# Patient Record
Sex: Male | Born: 1945 | Race: White | Hispanic: No | Marital: Married | Smoking: Former smoker
Health system: Southern US, Community
[De-identification: ages and names within clinical notes are randomized; demographics above are authoritative.]

## PROBLEM LIST (undated history)

## (undated) DIAGNOSIS — I219 Acute myocardial infarction, unspecified: Secondary | ICD-10-CM

## (undated) DIAGNOSIS — J189 Pneumonia, unspecified organism: Secondary | ICD-10-CM

## (undated) DIAGNOSIS — K219 Gastro-esophageal reflux disease without esophagitis: Secondary | ICD-10-CM

## (undated) DIAGNOSIS — Z96659 Presence of unspecified artificial knee joint: Secondary | ICD-10-CM

## (undated) DIAGNOSIS — I4510 Unspecified right bundle-branch block: Secondary | ICD-10-CM

## (undated) DIAGNOSIS — T84038A Mechanical loosening of other internal prosthetic joint, initial encounter: Secondary | ICD-10-CM

## (undated) DIAGNOSIS — K279 Peptic ulcer, site unspecified, unspecified as acute or chronic, without hemorrhage or perforation: Secondary | ICD-10-CM

## (undated) DIAGNOSIS — I2510 Atherosclerotic heart disease of native coronary artery without angina pectoris: Secondary | ICD-10-CM

## (undated) DIAGNOSIS — F32A Depression, unspecified: Secondary | ICD-10-CM

## (undated) DIAGNOSIS — Z789 Other specified health status: Secondary | ICD-10-CM

## (undated) DIAGNOSIS — N281 Cyst of kidney, acquired: Secondary | ICD-10-CM

## (undated) DIAGNOSIS — G629 Polyneuropathy, unspecified: Secondary | ICD-10-CM

## (undated) DIAGNOSIS — R519 Headache, unspecified: Secondary | ICD-10-CM

## (undated) DIAGNOSIS — R51 Headache: Secondary | ICD-10-CM

## (undated) DIAGNOSIS — D649 Anemia, unspecified: Secondary | ICD-10-CM

## (undated) DIAGNOSIS — I209 Angina pectoris, unspecified: Secondary | ICD-10-CM

## (undated) DIAGNOSIS — D696 Thrombocytopenia, unspecified: Secondary | ICD-10-CM

## (undated) DIAGNOSIS — E785 Hyperlipidemia, unspecified: Secondary | ICD-10-CM

## (undated) DIAGNOSIS — F419 Anxiety disorder, unspecified: Secondary | ICD-10-CM

## (undated) DIAGNOSIS — G43909 Migraine, unspecified, not intractable, without status migrainosus: Secondary | ICD-10-CM

## (undated) DIAGNOSIS — F0280 Dementia in other diseases classified elsewhere without behavioral disturbance: Secondary | ICD-10-CM

## (undated) DIAGNOSIS — R4189 Other symptoms and signs involving cognitive functions and awareness: Secondary | ICD-10-CM

## (undated) DIAGNOSIS — E119 Type 2 diabetes mellitus without complications: Secondary | ICD-10-CM

## (undated) DIAGNOSIS — R251 Tremor, unspecified: Secondary | ICD-10-CM

## (undated) DIAGNOSIS — G4730 Sleep apnea, unspecified: Secondary | ICD-10-CM

## (undated) DIAGNOSIS — N183 Chronic kidney disease, stage 3 unspecified: Secondary | ICD-10-CM

## (undated) DIAGNOSIS — Z8719 Personal history of other diseases of the digestive system: Secondary | ICD-10-CM

## (undated) DIAGNOSIS — M1990 Unspecified osteoarthritis, unspecified site: Secondary | ICD-10-CM

## (undated) DIAGNOSIS — H9110 Presbycusis, unspecified ear: Secondary | ICD-10-CM

## (undated) DIAGNOSIS — Z8669 Personal history of other diseases of the nervous system and sense organs: Secondary | ICD-10-CM

## (undated) DIAGNOSIS — G2581 Restless legs syndrome: Secondary | ICD-10-CM

## (undated) DIAGNOSIS — Z7982 Long term (current) use of aspirin: Secondary | ICD-10-CM

## (undated) DIAGNOSIS — G4700 Insomnia, unspecified: Secondary | ICD-10-CM

## (undated) DIAGNOSIS — I272 Pulmonary hypertension, unspecified: Secondary | ICD-10-CM

## (undated) DIAGNOSIS — J449 Chronic obstructive pulmonary disease, unspecified: Secondary | ICD-10-CM

## (undated) HISTORY — PX: TONSILLECTOMY: SUR1361

## (undated) HISTORY — PX: HERNIA REPAIR: SHX51

## (undated) HISTORY — PX: COLONOSCOPY: SHX174

## (undated) HISTORY — PX: CORONARY ANGIOPLASTY: SHX604

## (undated) HISTORY — PX: VASECTOMY: SHX75

## (undated) HISTORY — PX: INGUINAL HERNIA REPAIR: SUR1180

## (undated) HISTORY — PX: EYE SURGERY: SHX253

---

## 1994-06-09 DIAGNOSIS — I219 Acute myocardial infarction, unspecified: Secondary | ICD-10-CM

## 1994-06-09 HISTORY — DX: Acute myocardial infarction, unspecified: I21.9

## 1995-06-10 HISTORY — PX: CORONARY ANGIOPLASTY WITH STENT PLACEMENT: SHX49

## 1998-06-09 HISTORY — PX: CORONARY ANGIOPLASTY WITH STENT PLACEMENT: SHX49

## 2002-06-09 DIAGNOSIS — Z8719 Personal history of other diseases of the digestive system: Secondary | ICD-10-CM

## 2002-06-09 HISTORY — DX: Personal history of other diseases of the digestive system: Z87.19

## 2003-06-10 HISTORY — PX: KNEE ARTHROSCOPY: SUR90

## 2004-06-09 HISTORY — PX: KNEE ARTHROSCOPY: SUR90

## 2005-06-09 HISTORY — PX: JOINT REPLACEMENT: SHX530

## 2005-06-09 HISTORY — PX: TOTAL KNEE ARTHROPLASTY: SHX125

## 2010-04-02 HISTORY — PX: LEFT HEART CATH AND CORONARY ANGIOGRAPHY: CATH118249

## 2010-06-09 HISTORY — PX: HEEL SPUR RESECTION: SHX6410

## 2013-06-09 HISTORY — PX: EYE SURGERY: SHX253

## 2013-06-09 HISTORY — PX: TOTAL KNEE ARTHROPLASTY: SHX125

## 2013-06-09 HISTORY — PX: CATARACT EXTRACTION: SUR2

## 2013-06-09 HISTORY — PX: JOINT REPLACEMENT: SHX530

## 2015-04-30 HISTORY — PX: LEFT HEART CATH AND CORONARY ANGIOGRAPHY: CATH118249

## 2015-06-10 HISTORY — PX: UPPER GI ENDOSCOPY: SHX6162

## 2015-06-10 HISTORY — PX: OTHER SURGICAL HISTORY: SHX169

## 2016-03-17 HISTORY — PX: LAPAROSCOPIC GASTRIC SLEEVE RESECTION: SHX5895

## 2017-11-21 ENCOUNTER — Ambulatory Visit
Admission: EM | Admit: 2017-11-21 | Discharge: 2017-11-21 | Disposition: A | Discharge status: Home / Self Care | Payer: TRICARE For Life (TFL) | Attending: Family Medicine | Admitting: Family Medicine

## 2017-11-21 DIAGNOSIS — S61012A Laceration without foreign body of left thumb without damage to nail, initial encounter: Secondary | ICD-10-CM

## 2017-11-21 DIAGNOSIS — W260XXA Contact with knife, initial encounter: Secondary | ICD-10-CM

## 2017-11-21 NOTE — ED Provider Notes (Signed)
[facility]  CSN: [id number] Arrival date & time: 11/21/17  1449  History   Chief Complaint Chief Complaint  Patient presents with  . Finger Injury   HPI   72 year old male presents with a thumb laceration.  Patient was using a knife to try and remove something from a camera.  He inadvertently cut his left thumb.  Patient reports linear laceration to his left thumb on the palmar side.  Patient reports that he had difficulty controlling the bleeding.  He states that it was a clean blade.  Last tetanus was in 2017.  Mild pain currently.  No nailbed injury.  Wound is not contaminated.  He did not clean the wound prior to arrival.  No other associated symptoms.  No other complaints at this time.  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   Surgical Hx: SHOULDER SURGERY 06/09/1993 - 06/08/1994 Right    REPLACEMENT TOTAL KNEE 06/09/2005 - 06/08/2006 Left    HEEL SPUR EXCISION 06/09/2010 - 06/09/2011 Right    CORONARY ANGIOPLASTY WITH STENT PLACEMENT   x2 one RCA 1997/ LCX 2000   PR REMV  CATARACT EXTRACAP,INSERT LENS 11/23/2013 Eye/Right Procedure: CATARACT EXTRACTION AND INTRAOCULAR LENS IMPLANTATION USING FEMTOSECOND LASER RIGHT EYE; Surgeon: Marylene Land, MD; Location: [facility]; Service: Ophthalmology  Medical devices from this surgery are in the Implants section.   PR CORNEA INCISNS RECIPIENT CORNEA W/LASR Deidre Ala 11/23/2013 Eye/Right Procedure: FEMTO SECOND PROCEDURE RIGHT EYE; Surgeon: Marylene Land, MD; Location: [facility]; Service: Ophthalmology   PR CORNEA INCISNS RECIPIENT CORNEA W/LASR Deidre Ala 12/21/2013 Left Procedure: Femtosecond Laser Left eye ; Surgeon: Marylene Land, MD; Location: [facility]; Service: Ophthalmology   PR REMV CATARACT EXTRACAP,INSERT LENS 12/21/2013 Eye/Left Procedure: CATARACT EXTRACTION WITH INSERTION OF INTRAOCULAR LENS PHACOEMULSIFICATION LEFT EYE; Surgeon: Marylene Land, MD; Location: [facility]; Service: Ophthalmology  Medical devices from this surgery are in the Implants section.   TOTAL KNEE ARTHROPLASTY 06/09/2013 - 06/08/2014 Right    PR CATH PLACE/CORON ANGIO, IMG SUPER/INTERP,W LEFT HEART VENTRICULOGRAPHY 04/30/2015 Left Procedure: Left Heart Catheterization W Intervention (Radial); Surgeon: Carlyle Lipa, MD; Location: [facility]; Service: Cardiology   VASECTOMY      PR UPPER GI ENDOSCOPY,BIOPSY 11/19/2015 N/A Procedure: ESOPHAGOGASTRODUODENOSCOPY WITH BIOPSY; Surgeon: Charise Killian, MD; Location: [facility]O [facility]URES [facility]; Service: Gastroenterology   KNEE ARTHROSCOPY 2005 & 2006 Bilateral right, then left   FOOT SURGERY 06/09/2009 - 06/08/2010 Right plantar fasciitis & achilles tendon   TONSILLECTOMY  PR UPPER GI ENDOSCOPY,BIOPSY 01/28/2016 N/A Procedure: ESOPHAGOGASTRODUODENOSCOPY WITH BIOPSIES ; Surgeon: Charise Killianolm Joseph O'Loughlin, MD; Location: [facility]O [facility]; Service: Gastroenterology   CARDIAC CATHETERIZATION 10/11 & 2016  1996, 2000 and 2016was non obstructive   HERNIA REPAIR  Bilateral  inguinal hernia repair   JOINT REPLACEMENT  Bilateral knee replacement   EYE SURGERY  Bilateral bilateral cataract   PR LAP, GAST RESTRICT PROC, LONGITUDINAL GASTRECTOMY 03/17/2016 N/A Procedure: LAPAROSCOPIC VERTICAL SLEEVE GASTRECTOMY; Surgeon: San MorelleLindsey Sean Sharp, MD; Location: OR [facility]; Service: General Surgery    Family hx: Birth defects Daughter    Arthritis Father    Heart disease Father    Stroke Father    Arthritis Mother    Heart disease Sister     Social Hx: Former Smoker Cigarettes 1 34 Quit: 08/30/1994  Smokeless Tobacco: Never Used      Alcohol Use Drinks/Week oz/Week Comments  No 0 Standard drinks or equivalent  0.0      Allergies   Tape; Penicillins; and Shellfish allergy   Review of Systems Review of Systems  Constitutional: Negative.   Skin: Positive for wound.   Physical Exam Triage Vital Signs ED Triage Vitals  Enc Vitals Group     BP 11/21/17 1510 (!) 109/53     Pulse Rate 11/21/17 1510 (!) 57     Resp 11/21/17 1510 16     Temp 11/21/17 1510 98.4 F (36.9 C)     Temp Source 11/21/17 1510 Oral     SpO2 11/21/17 1510 97 %     Weight 11/21/17 1506 290 lb (131.5 kg)     Height 11/21/17 1506 5\' 11"  (1.803 m)     Head Circumference --      Peak Flow --      Pain Score 11/21/17 1506 3     Pain Loc --      Pain Edu? --      Excl. in GC? --    Updated Vital Signs BP (!) 109/53 (BP Location: Right Arm)   Pulse (!) 57   Temp 98.4 F (36.9 C) (Oral)   Resp 16   Ht 5\' 11"  (1.803 m)   Wt 290 lb (131.5 kg)   SpO2 97%   BMI 40.45 kg/m   Physical Exam  Constitutional: He is oriented to person, place, and time. He appears well-developed. No distress.  HENT:  Head: Normocephalic and atraumatic.  Pulmonary/Chest: Effort normal. No respiratory distress.  Neurological: He is alert and oriented to person, place, and time.  Skin:  Left thumb - 2.5 linear, vertical laceration of the palmar aspect of the distal thumb above the IP  joint.  Psychiatric: He has a normal mood and affect. His behavior is normal.  Nursing note and vitals reviewed.  UC Treatments / Results  Labs (all labs ordered are listed, but only abnormal results are displayed) Labs Reviewed - No data to display  EKG None  Radiology No results found.  Procedures Laceration Repair Date/Time: 11/21/2017 5:19 PM Performed by: Tommie Samsook, Shankar Silber G, DO Authorized by: Tommie Samsook, Kloie Whiting G, DO   Consent:    Consent obtained:  Verbal   Consent given by:  Patient Anesthesia (see MAR for exact dosages):    Anesthesia method:  Local infiltration   Local anesthetic:  Lidocaine 1% WITH epi Laceration details:    Location:  Finger   Finger location:  L thumb   Length (cm):  2.5 Repair type:    Repair type:  Simple Pre-procedure details:  Preparation:  Patient was prepped and draped in usual sterile fashion Exploration:    Hemostasis achieved with:  Epinephrine and direct pressure   Wound exploration: wound explored through full range of motion   Treatment:    Area cleansed with:  Soap and water and Betadine   Irrigation solution:  Sterile water   Irrigation method:  Syringe   Visualized foreign bodies/material removed: no   Skin repair:    Repair method:  Sutures   Suture size:  6-0   Suture material:  Nylon   Suture technique:  Simple interrupted   Number of sutures:  4 Approximation:    Approximation:  Close Post-procedure details:    Dressing:  Antibiotic ointment and bulky dressing   Patient tolerance of procedure:  Tolerated well, no immediate complications   (including critical care time)  Medications Ordered in UC Medications - No data to display  Initial Impression / Assessment and Plan / UC Course  I have reviewed the triage vital signs and the nursing notes.  Pertinent labs & imaging results that were available during my care of the patient were reviewed by me and considered in my medical decision making (see chart for details).      72 year old male presents with a laceration.  Repaired without difficulty today.  Sutures out in 7 days.  Tetanus up-to-date.  Final Clinical Impressions(s) / UC Diagnoses   Final diagnoses:  Laceration of left thumb without foreign body without damage to nail, initial encounter     Discharge Instructions     Sutures out in 7 days.  Routine cleaning/bathing.  Take care  Dr. Adriana Simas    ED Prescriptions    None     Controlled Substance Prescriptions [place] Controlled Substance Registry consulted? Not Applicable   Tommie Sams, DO 11/21/17 1720

## 2017-11-21 NOTE — ED Triage Notes (Signed)
As per patient trying to get stuff off from camera used knife and got cut in Left side couldn't able to stop bleeding.

## 2017-11-21 NOTE — Discharge Instructions (Signed)
Sutures out in 7 days.  Routine cleaning/bathing.  Take care  Dr. Adriana Simasook

## 2017-11-28 ENCOUNTER — Encounter: Payer: Self-pay | Admitting: Gynecology

## 2017-11-28 ENCOUNTER — Ambulatory Visit
Admission: EM | Admit: 2017-11-28 | Discharge: 2017-11-28 | Disposition: A | Discharge status: Home / Self Care | Payer: Medicare Other

## 2017-11-28 DIAGNOSIS — S61012D Laceration without foreign body of left thumb without damage to nail, subsequent encounter: Secondary | ICD-10-CM | POA: Diagnosis not present

## 2017-11-28 DIAGNOSIS — Z4802 Encounter for removal of sutures: Secondary | ICD-10-CM

## 2017-11-28 NOTE — ED Triage Notes (Signed)
Patient present with left thumb suture removal. Suture remove x 2. Per patient had 4 sutures placed, however per patient two of his suture had fell out. Wound at left thumb clean and clean. No infection noted. Steri strip place.

## 2017-12-29 ENCOUNTER — Ambulatory Visit: Payer: Self-pay | Admitting: Family Medicine

## 2018-01-12 ENCOUNTER — Other Ambulatory Visit: Payer: Self-pay | Admitting: Podiatry

## 2018-01-12 ENCOUNTER — Other Ambulatory Visit: Payer: Self-pay

## 2018-01-12 ENCOUNTER — Encounter
Admission: RE | Admit: 2018-01-12 | Discharge: 2018-01-12 | Disposition: A | Discharge status: Home / Self Care | Payer: Medicare Other | Source: Ambulatory Visit | Attending: Podiatry | Admitting: Podiatry

## 2018-01-12 DIAGNOSIS — Z96653 Presence of artificial knee joint, bilateral: Secondary | ICD-10-CM | POA: Diagnosis not present

## 2018-01-12 DIAGNOSIS — Z880 Allergy status to penicillin: Secondary | ICD-10-CM | POA: Diagnosis not present

## 2018-01-12 DIAGNOSIS — Z823 Family history of stroke: Secondary | ICD-10-CM | POA: Diagnosis not present

## 2018-01-12 DIAGNOSIS — Z7982 Long term (current) use of aspirin: Secondary | ICD-10-CM | POA: Diagnosis not present

## 2018-01-12 DIAGNOSIS — Z9841 Cataract extraction status, right eye: Secondary | ICD-10-CM | POA: Diagnosis not present

## 2018-01-12 DIAGNOSIS — Z79899 Other long term (current) drug therapy: Secondary | ICD-10-CM | POA: Diagnosis not present

## 2018-01-12 DIAGNOSIS — I252 Old myocardial infarction: Secondary | ICD-10-CM | POA: Diagnosis not present

## 2018-01-12 DIAGNOSIS — M1990 Unspecified osteoarthritis, unspecified site: Secondary | ICD-10-CM | POA: Diagnosis not present

## 2018-01-12 DIAGNOSIS — I25119 Atherosclerotic heart disease of native coronary artery with unspecified angina pectoris: Secondary | ICD-10-CM | POA: Diagnosis not present

## 2018-01-12 DIAGNOSIS — Z01810 Encounter for preprocedural cardiovascular examination: Secondary | ICD-10-CM

## 2018-01-12 DIAGNOSIS — Z87891 Personal history of nicotine dependence: Secondary | ICD-10-CM | POA: Diagnosis not present

## 2018-01-12 DIAGNOSIS — Z8711 Personal history of peptic ulcer disease: Secondary | ICD-10-CM | POA: Diagnosis not present

## 2018-01-12 DIAGNOSIS — Z9842 Cataract extraction status, left eye: Secondary | ICD-10-CM | POA: Diagnosis not present

## 2018-01-12 DIAGNOSIS — Z91013 Allergy to seafood: Secondary | ICD-10-CM | POA: Diagnosis not present

## 2018-01-12 DIAGNOSIS — Z888 Allergy status to other drugs, medicaments and biological substances status: Secondary | ICD-10-CM | POA: Diagnosis not present

## 2018-01-12 DIAGNOSIS — G4700 Insomnia, unspecified: Secondary | ICD-10-CM | POA: Diagnosis not present

## 2018-01-12 DIAGNOSIS — G2581 Restless legs syndrome: Secondary | ICD-10-CM | POA: Diagnosis not present

## 2018-01-12 DIAGNOSIS — E1140 Type 2 diabetes mellitus with diabetic neuropathy, unspecified: Secondary | ICD-10-CM | POA: Diagnosis not present

## 2018-01-12 DIAGNOSIS — M2041 Other hammer toe(s) (acquired), right foot: Secondary | ICD-10-CM | POA: Diagnosis not present

## 2018-01-12 DIAGNOSIS — M216X1 Other acquired deformities of right foot: Secondary | ICD-10-CM | POA: Diagnosis present

## 2018-01-12 DIAGNOSIS — E785 Hyperlipidemia, unspecified: Secondary | ICD-10-CM | POA: Diagnosis not present

## 2018-01-12 HISTORY — DX: Sleep apnea, unspecified: G47.30

## 2018-01-12 HISTORY — DX: Headache: R51

## 2018-01-12 HISTORY — DX: Angina pectoris, unspecified: I20.9

## 2018-01-12 HISTORY — DX: Headache, unspecified: R51.9

## 2018-01-12 HISTORY — DX: Unspecified osteoarthritis, unspecified site: M19.90

## 2018-01-12 HISTORY — DX: Personal history of other diseases of the digestive system: Z87.19

## 2018-01-12 HISTORY — DX: Gastro-esophageal reflux disease without esophagitis: K21.9

## 2018-01-12 HISTORY — DX: Atherosclerotic heart disease of native coronary artery without angina pectoris: I25.10

## 2018-01-12 HISTORY — DX: Type 2 diabetes mellitus without complications: E11.9

## 2018-01-12 HISTORY — DX: Acute myocardial infarction, unspecified: I21.9

## 2018-01-12 HISTORY — DX: Polyneuropathy, unspecified: G62.9

## 2018-01-12 LAB — BASIC METABOLIC PANEL
Anion gap: 5 (ref 5–15)
BUN: 27 mg/dL — ABNORMAL HIGH (ref 8–23)
CALCIUM: 9 mg/dL (ref 8.9–10.3)
CO2: 27 mmol/L (ref 22–32)
Chloride: 109 mmol/L (ref 98–111)
Creatinine, Ser: 1.15 mg/dL (ref 0.61–1.24)
GLUCOSE: 119 mg/dL — AB (ref 70–99)
POTASSIUM: 4.9 mmol/L (ref 3.5–5.1)
Sodium: 141 mmol/L (ref 135–145)

## 2018-01-12 LAB — CBC
HEMATOCRIT: 38.8 % — AB (ref 40.0–52.0)
Hemoglobin: 13 g/dL (ref 13.0–18.0)
MCH: 31 pg (ref 26.0–34.0)
MCHC: 33.5 g/dL (ref 32.0–36.0)
MCV: 92.5 fL (ref 80.0–100.0)
PLATELETS: 148 10*3/uL — AB (ref 150–440)
RBC: 4.19 MIL/uL — AB (ref 4.40–5.90)
RDW: 13.8 % (ref 11.5–14.5)
WBC: 5.9 10*3/uL (ref 3.8–10.6)

## 2018-01-12 NOTE — Patient Instructions (Signed)
Your procedure is scheduled on: Friday 01/15/18  Report to DAY SURGERY DEPARTMENT LOCATED ON 2ND FLOOR MEDICAL MALL ENTRANCE. To find out your arrival time please call [phone] between 1PM - 3PM on Thursday 01/14/18.  Remember: Instructions that are not followed completely may result in serious medical risk, up to and including death, or upon the discretion of your surgeon and anesthesiologist your surgery may need to be rescheduled.     _X__ 1. Do not eat food after midnight the night before your procedure.                 No gum chewing or hard candies. You may drink clear liquids up to 2 hours                 before you are scheduled to arrive for your surgery- DO not drink clear                 liquids within 2 hours of the start of your surgery.                 Clear Liquids include:  water, apple juice without pulp, clear carbohydrate                 drink such as Clearfast or Gatorade, Black Coffee or Tea (Do not add                 anything to coffee or tea).  __X__2.  On the morning of surgery brush your teeth with toothpaste and water, you may rinse your mouth with mouthwash if you wish.  Do not swallow any toothpaste of mouthwash.    __X__ 3.  No Alcohol for 24 hours before or after surgery.   _X__ 4.  Do Not Smoke or use e-cigarettes For 24 Hours Prior to Your Surgery.                 Do not use any chewable tobacco products for at least 6 hours prior to                 surgery.  ____  5.  Bring all medications with you on the day of surgery if instructed.   __X__  6.  Notify your doctor if there is any change in your medical condition      (cold, fever, infections).     Do not wear jewelry, make-up, hairpins, clips or nail polish. Do not wear lotions, powders, or perfumes.  Do not shave 48 hours prior to surgery. Men may shave face and neck. Do not bring valuables to the hospital.    [facility] is not responsible for any belongings or valuables.  Contacts,  dentures/partials or body piercings may not be worn into surgery. Bring a case for your contacts, glasses or hearing aids, a denture cup will be supplied. Leave your suitcase in the car. After surgery it may be brought to your room. For patients admitted to the hospital, discharge time is determined by your treatment team.   Patients discharged the day of surgery will not be allowed to drive home.   Please read over the following fact sheets that you were given:   MRSA Information  __X__ Take these medicines the morning of surgery with A SIP OF WATER:     1. LYRICA 200 MG capsule  2.   3.    5.  6.  ____ Fleet Enema (as directed)   __X__ Use CHG Soap/SAGE  wipes as directed  ____ Use inhalers on the day of surgery. Also bring the inhaler with you to the hospital on the morning of surgery.  ____ Stop metformin/Janumet/Farxiga 2 days prior to surgery    ____ Take 1/2 of usual insulin dose the night before surgery. No insulin the morning          of surgery.   __X__ Stop Blood Thinners Coumadin/Plavix/Xarelto/Pleta/Pradaxa/Eliquis/Effient/Aspirin TODAY.  __X__ Stop Anti-inflammatories 7 days before surgery such as Advil, Ibuprofen, Motrin, BC or Goodies Powder, Naprosyn, Naproxen, Aleve, Aspirin, Meloxicam. May take Tylenol if needed for pain or discomfort.   __X__ Stop all herbal supplements, fish oil or vitamin E until after surgery.   ____ Bring C-Pap to the hospital.

## 2018-01-14 MED ORDER — CLINDAMYCIN PHOSPHATE 900 MG/50ML IV SOLN
900.0000 mg | INTRAVENOUS | Status: AC
  Administered 2018-01-15: 900 mg via INTRAVENOUS

## 2018-01-15 ENCOUNTER — Ambulatory Visit
Admission: RE | Admit: 2018-01-15 | Discharge: 2018-01-15 | Disposition: A | Discharge status: Home / Self Care | Payer: Medicare Other | Source: Ambulatory Visit | Attending: Podiatry | Admitting: Podiatry

## 2018-01-15 ENCOUNTER — Ambulatory Visit: Payer: Medicare Other | Admitting: Certified Registered"

## 2018-01-15 ENCOUNTER — Encounter
Admission: RE | Disposition: A | Discharge status: Home / Self Care | Payer: Self-pay | Source: Ambulatory Visit | Attending: Podiatry

## 2018-01-15 ENCOUNTER — Other Ambulatory Visit: Payer: Self-pay

## 2018-01-15 DIAGNOSIS — E1140 Type 2 diabetes mellitus with diabetic neuropathy, unspecified: Secondary | ICD-10-CM | POA: Insufficient documentation

## 2018-01-15 DIAGNOSIS — Z823 Family history of stroke: Secondary | ICD-10-CM | POA: Insufficient documentation

## 2018-01-15 DIAGNOSIS — Z9842 Cataract extraction status, left eye: Secondary | ICD-10-CM | POA: Insufficient documentation

## 2018-01-15 DIAGNOSIS — G4700 Insomnia, unspecified: Secondary | ICD-10-CM | POA: Insufficient documentation

## 2018-01-15 DIAGNOSIS — G2581 Restless legs syndrome: Secondary | ICD-10-CM | POA: Insufficient documentation

## 2018-01-15 DIAGNOSIS — Z888 Allergy status to other drugs, medicaments and biological substances status: Secondary | ICD-10-CM | POA: Insufficient documentation

## 2018-01-15 DIAGNOSIS — M2041 Other hammer toe(s) (acquired), right foot: Secondary | ICD-10-CM | POA: Insufficient documentation

## 2018-01-15 DIAGNOSIS — Z91013 Allergy to seafood: Secondary | ICD-10-CM | POA: Insufficient documentation

## 2018-01-15 DIAGNOSIS — Z880 Allergy status to penicillin: Secondary | ICD-10-CM | POA: Insufficient documentation

## 2018-01-15 DIAGNOSIS — Z8711 Personal history of peptic ulcer disease: Secondary | ICD-10-CM | POA: Insufficient documentation

## 2018-01-15 DIAGNOSIS — Z79899 Other long term (current) drug therapy: Secondary | ICD-10-CM | POA: Insufficient documentation

## 2018-01-15 DIAGNOSIS — Z7982 Long term (current) use of aspirin: Secondary | ICD-10-CM | POA: Insufficient documentation

## 2018-01-15 DIAGNOSIS — I252 Old myocardial infarction: Secondary | ICD-10-CM | POA: Insufficient documentation

## 2018-01-15 DIAGNOSIS — M216X1 Other acquired deformities of right foot: Secondary | ICD-10-CM | POA: Insufficient documentation

## 2018-01-15 DIAGNOSIS — Z87891 Personal history of nicotine dependence: Secondary | ICD-10-CM | POA: Insufficient documentation

## 2018-01-15 DIAGNOSIS — Z96653 Presence of artificial knee joint, bilateral: Secondary | ICD-10-CM | POA: Insufficient documentation

## 2018-01-15 DIAGNOSIS — I25119 Atherosclerotic heart disease of native coronary artery with unspecified angina pectoris: Secondary | ICD-10-CM | POA: Insufficient documentation

## 2018-01-15 DIAGNOSIS — E785 Hyperlipidemia, unspecified: Secondary | ICD-10-CM | POA: Insufficient documentation

## 2018-01-15 DIAGNOSIS — Z9841 Cataract extraction status, right eye: Secondary | ICD-10-CM | POA: Insufficient documentation

## 2018-01-15 DIAGNOSIS — M1990 Unspecified osteoarthritis, unspecified site: Secondary | ICD-10-CM | POA: Insufficient documentation

## 2018-01-15 HISTORY — PX: WEIL OSTEOTOMY: SHX5044

## 2018-01-15 HISTORY — PX: HAMMER TOE SURGERY: SHX385

## 2018-01-15 SURGERY — OSTEOTOMY, WEIL
Anesthesia: General | Site: Foot | Laterality: Right | Wound class: Clean

## 2018-01-15 MED ORDER — ONDANSETRON HCL 4 MG/2ML IJ SOLN: 4.0000 mg | Freq: Once | INTRAMUSCULAR | Status: DC | PRN

## 2018-01-15 MED ORDER — PROPOFOL 500 MG/50ML IV EMUL
INTRAVENOUS | Status: AC
  Filled 2018-01-15: qty 50

## 2018-01-15 MED ORDER — FENTANYL CITRATE (PF) 100 MCG/2ML IJ SOLN: 25.0000 ug | INTRAMUSCULAR | Status: DC | PRN

## 2018-01-15 MED ORDER — PROPOFOL 10 MG/ML IV BOLUS
INTRAVENOUS | Status: AC
Start: 2018-01-15 — End: ?
  Filled 2018-01-15: qty 20

## 2018-01-15 MED ORDER — LIDOCAINE HCL (PF) 2 % IJ SOLN
INTRAMUSCULAR | Status: AC
  Filled 2018-01-15: qty 10

## 2018-01-15 MED ORDER — OXYCODONE-ACETAMINOPHEN 7.5-325 MG PO TABS: 1.0000 | ORAL_TABLET | ORAL | 0 refills | Status: DC | PRN

## 2018-01-15 MED ORDER — LIDOCAINE HCL (PF) 1 % IJ SOLN
INTRAMUSCULAR | Status: AC
  Filled 2018-01-15: qty 30

## 2018-01-15 MED ORDER — BUPIVACAINE HCL 0.5 % IJ SOLN
INTRAMUSCULAR | Status: DC | PRN
  Administered 2018-01-15: 5 mL
  Administered 2018-01-15: 10 mL

## 2018-01-15 MED ORDER — CHLORHEXIDINE GLUCONATE 4 % EX LIQD: 60.0000 mL | Freq: Once | CUTANEOUS | Status: DC

## 2018-01-15 MED ORDER — LIDOCAINE HCL (CARDIAC) PF 100 MG/5ML IV SOSY
PREFILLED_SYRINGE | INTRAVENOUS | Status: DC | PRN
  Administered 2018-01-15: 50 mg via INTRAVENOUS

## 2018-01-15 MED ORDER — POVIDONE-IODINE 7.5 % EX SOLN
Freq: Once | CUTANEOUS | Status: DC
  Filled 2018-01-15: qty 118

## 2018-01-15 MED ORDER — LACTATED RINGERS IV SOLN
INTRAVENOUS | Status: DC
  Administered 2018-01-15: 09:00:00 via INTRAVENOUS

## 2018-01-15 MED ORDER — PROPOFOL 500 MG/50ML IV EMUL
INTRAVENOUS | Status: DC | PRN
  Administered 2018-01-15: 50 ug/kg/min via INTRAVENOUS

## 2018-01-15 MED ORDER — PROPOFOL 10 MG/ML IV BOLUS
INTRAVENOUS | Status: AC
  Filled 2018-01-15: qty 20

## 2018-01-15 MED ORDER — ONDANSETRON HCL 4 MG/2ML IJ SOLN
INTRAMUSCULAR | Status: AC
  Filled 2018-01-15: qty 2

## 2018-01-15 MED ORDER — BUPIVACAINE HCL (PF) 0.25 % IJ SOLN
INTRAMUSCULAR | Status: AC
  Filled 2018-01-15: qty 30

## 2018-01-15 MED ORDER — BUPIVACAINE HCL (PF) 0.5 % IJ SOLN
INTRAMUSCULAR | Status: AC
  Filled 2018-01-15: qty 30

## 2018-01-15 MED ORDER — CLINDAMYCIN PHOSPHATE 900 MG/50ML IV SOLN
INTRAVENOUS | Status: AC
  Filled 2018-01-15: qty 50

## 2018-01-15 MED ORDER — PROPOFOL 10 MG/ML IV BOLUS
INTRAVENOUS | Status: DC | PRN
  Administered 2018-01-15: 20 mg via INTRAVENOUS
  Administered 2018-01-15: 50 mg via INTRAVENOUS
  Administered 2018-01-15: 10 mg via INTRAVENOUS

## 2018-01-15 MED ORDER — FENTANYL CITRATE (PF) 100 MCG/2ML IJ SOLN
INTRAMUSCULAR | Status: DC | PRN
  Administered 2018-01-15: 50 ug via INTRAVENOUS

## 2018-01-15 MED ORDER — LIDOCAINE HCL 1 % IJ SOLN
INTRAMUSCULAR | Status: DC | PRN
  Administered 2018-01-15: 5 mL

## 2018-01-15 MED ORDER — FENTANYL CITRATE (PF) 100 MCG/2ML IJ SOLN
INTRAMUSCULAR | Status: AC
  Filled 2018-01-15: qty 2

## 2018-01-15 MED ORDER — FAMOTIDINE 20 MG PO TABS: 20.0000 mg | ORAL_TABLET | Freq: Once | ORAL | Status: DC

## 2018-01-15 MED ORDER — FAMOTIDINE 20 MG PO TABS
ORAL_TABLET | ORAL | Status: AC
  Administered 2018-01-15: 20 mg
  Filled 2018-01-15: qty 1

## 2018-01-15 SURGICAL SUPPLY — 54 items
0.9mm wire ×12 IMPLANT
BANDAGE ELASTIC 4 LF NS (GAUZE/BANDAGES/DRESSINGS) ×6 IMPLANT
BIT DRILL 1.7 LNG CANN (DRILL) ×3 IMPLANT
BLADE MED AGGRESSIVE (BLADE) ×3 IMPLANT
BLADE OSC/SAGITTAL MD 5.5X18 (BLADE) ×3 IMPLANT
BLADE SURG 15 STRL LF DISP TIS (BLADE) ×2 IMPLANT
BLADE SURG 15 STRL SS (BLADE) ×4
BLADE SURG MINI STRL (BLADE) ×3 IMPLANT
BNDG CONFORM 3 STRL LF (GAUZE/BANDAGES/DRESSINGS) ×3 IMPLANT
BNDG ESMARK 4X12 TAN STRL LF (GAUZE/BANDAGES/DRESSINGS) ×3 IMPLANT
BNDG GAUZE 4.5X4.1 6PLY STRL (MISCELLANEOUS) ×3 IMPLANT
CANISTER SUCT 1200ML W/VALVE (MISCELLANEOUS) ×3 IMPLANT
CLOSURE WOUND 1/4X4 (GAUZE/BANDAGES/DRESSINGS) ×1
CNTRSNK DRL 2 HDLS SCR (MISCELLANEOUS) ×1 IMPLANT
COUNTERSINK 2.0 (MISCELLANEOUS) ×2
COVER PIN YLW 0.028-062 (MISCELLANEOUS) IMPLANT
CUFF TOURN 18 STER (MISCELLANEOUS) ×3 IMPLANT
CUFF TOURN DUAL PL 12 NO SLV (MISCELLANEOUS) IMPLANT
DRAPE FLUOR MINI C-ARM 54X84 (DRAPES) ×3 IMPLANT
DURAPREP 26ML APPLICATOR (WOUND CARE) ×3 IMPLANT
ELECT REM PT RETURN 9FT ADLT (ELECTROSURGICAL) ×3
ELECTRODE REM PT RTRN 9FT ADLT (ELECTROSURGICAL) ×1 IMPLANT
FIXATION HAMMERTOE ANGLD 15MM (Toe) ×1 IMPLANT
GAUZE PETRO XEROFOAM 1X8 (MISCELLANEOUS) ×3 IMPLANT
GAUZE SPONGE 4X4 12PLY STRL (GAUZE/BANDAGES/DRESSINGS) ×3 IMPLANT
GLOVE BIO SURGEON STRL SZ8 (GLOVE) ×3 IMPLANT
GLOVE INDICATOR 8.0 STRL GRN (GLOVE) ×3 IMPLANT
GOWN STRL REUS W/ TWL LRG LVL3 (GOWN DISPOSABLE) ×2 IMPLANT
GOWN STRL REUS W/TWL LRG LVL3 (GOWN DISPOSABLE) ×4
HAMMERTOE ANGLED 15MM 5MM (Toe) ×3 IMPLANT
KIT TURNOVER KIT A (KITS) ×3 IMPLANT
LABEL OR SOLS (LABEL) ×3 IMPLANT
NDL SAFETY ECLIPSE 18X1.5 (NEEDLE) ×1 IMPLANT
NEEDLE FILTER BLUNT 18X 1/2SAF (NEEDLE) ×2
NEEDLE FILTER BLUNT 18X1 1/2 (NEEDLE) ×1 IMPLANT
NEEDLE HYPO 18GX1.5 SHARP (NEEDLE) ×2
NEEDLE HYPO 25X1 1.5 SAFETY (NEEDLE) ×9 IMPLANT
NS IRRIG 500ML POUR BTL (IV SOLUTION) ×3 IMPLANT
PACK EXTREMITY ARMC (MISCELLANEOUS) ×3 IMPLANT
PAD PREP 24X41 OB/GYN DISP (PERSONAL CARE ITEMS) ×3 IMPLANT
PENCIL ELECTRO HAND CTR (MISCELLANEOUS) ×3 IMPLANT
RASP SM TEAR CROSS CUT (RASP) IMPLANT
SCREW 2X13MM STRL (Screw) ×6 IMPLANT
SCREW HEADLESS SHRT THRD 2X12 (Screw) ×6 IMPLANT
SPLINT FAST PLASTER 5X30 (CAST SUPPLIES) ×2
SPLINT PLASTER CAST FAST 5X30 (CAST SUPPLIES) ×1 IMPLANT
STOCKINETTE STRL 6IN 960660 (GAUZE/BANDAGES/DRESSINGS) ×3 IMPLANT
STRIP CLOSURE SKIN 1/4X4 (GAUZE/BANDAGES/DRESSINGS) ×2 IMPLANT
SUT ETHILON 5-0 FS-2 18 BLK (SUTURE) ×3 IMPLANT
SUT VIC AB 4-0 FS2 27 (SUTURE) ×6 IMPLANT
SUT VICRYL AB 3-0 FS1 BRD 27IN (SUTURE) ×3 IMPLANT
SYR 10ML LL (SYRINGE) ×6 IMPLANT
WIRE Z .045 C-WIRE SPADE TIP (WIRE) IMPLANT
WIRE Z .062 C-WIRE SPADE TIP (WIRE) IMPLANT

## 2018-01-15 NOTE — OR Nursing (Signed)
Discussed discharge instructions with pt and wife Jamesetta Sohyllis. Both voice understanding.

## 2018-01-15 NOTE — H&P (Signed)
H and P has been reviewed and no changes are noted. Anesthia has evaluated heart and lungs.

## 2018-01-15 NOTE — Transfer of Care (Signed)
Immediate Anesthesia Transfer of Care Note  Patient: Brian Kramer  Procedure(s) Performed: WEIL OSTEOTOMY-2ND & 3RD (Right Foot) HAMMER TOE CORRECTION-2ND & 3RD (Right Foot)  Patient Location: PACU  Anesthesia Type:General  Level of Consciousness: drowsy and responds to stimulation  Airway & Oxygen Therapy: Patient Spontanous Breathing and Patient connected to face mask oxygen  Post-op Assessment: Report given to RN and Post -op Vital signs reviewed and stable  Post vital signs: Reviewed and stable  Last Vitals:  Vitals Value Taken Time  BP 118/71 01/15/2018 11:34 AM  Temp    Pulse 61 01/15/2018 11:34 AM  Resp 13 01/15/2018 11:34 AM  SpO2 98 % 01/15/2018 11:34 AM    Last Pain:  Vitals:   01/15/18 0822  TempSrc: Tympanic  PainSc: 0-No pain         Complications: No apparent anesthesia complications

## 2018-01-15 NOTE — Anesthesia Procedure Notes (Addendum)
Performed by: Nalla Purdy, CRNA Pre-anesthesia Checklist: Patient identified, Emergency Drugs available, Suction available, Patient being monitored and Timeout performed Patient Re-evaluated:Patient Re-evaluated prior to induction Oxygen Delivery Method: Simple face mask Induction Type: IV induction Ventilation: Nasal airway inserted- appropriate to patient size       

## 2018-01-15 NOTE — Op Note (Signed)
Operative note   Surgeon: Dr. Recardo EvangelistMatthew Braylyn Eye, DPM.    Assistant: None    Preop diagnosis: 1.  Plantarflexed second metatarsal right foot 2.  Plantarflexed third metatarsal right foot 3.  Hammertoe deformity second toe right foot 4.  Hammertoe deformity third toe right foot    Postop diagnosis: Same    Procedure:   1.  Osteotomy second metatarsal screw fixation right foot   2.  Osteotomy third metatarsal right foot with screw fixation right foot.  Screws used in both cases were the [employer] mini monster headless screws   3.  Hammertoe correction second toe right foot with a hammerlock implant large and angled 4.  Hammertoe correction third toe right foot with hammerlock implants large and angled    EBL: Less than 5 cc    Anesthesia:IV sedation delivered by the anesthesia team and I delivered a preoperative injection at the base of the operative sites consisting of lidocaine 1% and Marcaine 0.5% mixed half-and-half.  10 cc were injected.  At the end of the case I injected 10 cc of 0.5% Marcaine plain around the operative site for more prolonged anesthesia.   Hemostasis: Ankle tourniquet 2 and 50 mils marked pressure for 1 hour    Specimen: None    Complications: None    Operative indications: Chronic pain unresponsive to conservative care    Procedure:  Patient was brought into the OR and placed on the operating table in thesupine position. After anesthesia was obtained theright lower extremity was prepped and draped in usual sterile fashion.  Operative Report: At this time attention was directed to the dorsum of the right foot where a 4 cm curvilinear incision made over the second metatarsal phalangeal joint extended up on the second toe in a linear fashion over the PIP joint.  Incision was deepened sharp but has dissection bleeders were clamped and bovied as required.  The extensor tendon was identified and the extensor hood release was performed medially and the tendon was retracted  laterally.  At this point a 15 blade was used to incise longitudinally the capsular and periosteal tissue overlying second metatarsal phalangeal joint.  This is free medial laterally.  This point osteotomy was performed in the second metatarsal obliquely from dorsal distal to plantar proximal.  Head metatarsal was shortened and shifted medially and fixated with 2 K wires from the 2.2 mini monster screw set.  There is checked FluoroScan good position and correction were noted.  At this point a 13 mm screw was placed across the proximal portion and a 11 mm was placed across the distal site.  There was then checked FluoroScan good position correction and fixation were noted.  At this time attention directed to the second toe of the right foot where the extensor tendon overlying the PIP joint was identified incised transversely reflected proximally.  The articular cartilage was then resected off the proximal phalanx had middle phalanx base.  A large angled hammerlock implant was then identified and the sites were prepped.  The implant was placed in and the joint was compressed and held for 1 minute.  After this was accomplished there was checked for skin good position correction of the implant was noted.  This time both surgical sites were irrigated and cleansed and the extensor tendon overlying the PIP joint was closed with 3-3 simple interrupted sutures of 4-0 Vicryl suture.  Proximally the periosteal and capsule tissue was closed over the MTP joint with 4-0 Vicryl in continuous stitch as were  deep superficial fascial layers.  Skin was closed along the length of this incision with 4-0 Vicryl subcuticular stitch.  At this time attention directed to the third toe and the third metatarsophalangeal joint where identical procedure was performed with the exception of 5-0 nylon utilized to close the digital portion of the surgery.  FluoroScan pictures showed excellent alignment of the toes and the metatarsal parabola  was in good position.  This time a sterile compressive dressing was placed across the wounds consisting of Steri-Strips Xeroform gauze 4 x 4's Kling and Kerlix.  Tourniquet was released and brought complete vascularity seen return to all digits of the right foot.  A posterior splint was placed on the right foot leg in the operating room.    Patient tolerated the procedure and anesthesia well.  Was transported from the OR to the PACU with all vital signs stable and vascular status intact. To be discharged per routine protocol.  Will follow up in approximately 1 week in the outpatient clinic.

## 2018-01-15 NOTE — Discharge Instructions (Signed)
[facility]  POST OPERATIVE INSTRUCTIONS FOR DR. Yennifer Segovia AND [facility]. [facility]   1. Take your medication as prescribed.  Pain medication should be taken only as needed.  2. Keep the dressing clean, dry and intact.  3. Keep your foot elevated above the heart level for the first 48 hours.  4. Walking to the bathroom and brief periods of walking are acceptable, unless we have instructed you to be non-weight bearing.  5. Always wear your post-op shoe when walking.  I recommend using a walker initially for better balance.  Make sure ambulation is with a flat gait did not walk heel-to-toe do not put weight on the ball the foot..  6. Do not take a shower unless you have a specific cast protector device for showering.  He could find these at a medical supply store.. Baths are permissible as long as the foot is kept out of the water.   7. Every hour you are awake:  - Bend your knee 15 times. - Flex foot 15 times - Massage calf 15 times  8. Call [facility] [phone]) if any of the following problems occur: - You develop a temperature or fever. - The bandage becomes saturated with blood. - Medication does not stop your pain. - Injury of the foot occurs. - Any symptoms of infection including redness, odor, or red streaks running from wound. -

## 2018-01-15 NOTE — Anesthesia Post-op Follow-up Note (Signed)
Anesthesia QCDR form completed.        

## 2018-01-15 NOTE — Anesthesia Preprocedure Evaluation (Addendum)
Anesthesia Evaluation  Patient identified by MRN, date of birth, ID band Patient awake    Reviewed: Allergy & Precautions, NPO status , Patient's Chart, lab work & pertinent test results  Airway Mallampati: II  TM Distance: >3 FB     Dental   Pulmonary sleep apnea , former smoker,    Pulmonary exam normal        Cardiovascular + angina + CAD and + Past MI  Normal cardiovascular exam     Neuro/Psych  Headaches,  Neuromuscular disease negative psych ROS   GI/Hepatic Neg liver ROS, hiatal hernia, GERD  ,  Endo/Other  diabetes, Well Controlled, Type 2  Renal/GU negative Renal ROS  negative genitourinary   Musculoskeletal  (+) Arthritis , Osteoarthritis,    Abdominal Normal abdominal exam  (+)   Peds negative pediatric ROS (+)  Hematology negative hematology ROS (+)   Anesthesia Other Findings   Reproductive/Obstetrics                             Anesthesia Physical Anesthesia Plan  ASA: III  Anesthesia Plan: General   Post-op Pain Management:    Induction: Intravenous  PONV Risk Score and Plan: TIVA  Airway Management Planned: Nasal Cannula  Additional Equipment:   Intra-op Plan:   Post-operative Plan:   Informed Consent: I have reviewed the patients History and Physical, chart, labs and discussed the procedure including the risks, benefits and alternatives for the proposed anesthesia with the patient or authorized representative who has indicated his/her understanding and acceptance.   Dental advisory given  Plan Discussed with: CRNA and Surgeon  Anesthesia Plan Comments:        Anesthesia Quick Evaluation

## 2018-01-18 ENCOUNTER — Encounter: Payer: Self-pay | Admitting: Podiatry

## 2018-01-18 NOTE — Anesthesia Postprocedure Evaluation (Signed)
Anesthesia Post Note  Patient: Brian Kramer  Procedure(s) Performed: WEIL OSTEOTOMY-2ND & 3RD (Right Foot) HAMMER TOE CORRECTION-2ND & 3RD (Right Foot)  Patient location during evaluation: PACU Anesthesia Type: General Level of consciousness: awake and alert and oriented Pain management: pain level controlled Vital Signs Assessment: post-procedure vital signs reviewed and stable Respiratory status: spontaneous breathing Cardiovascular status: blood pressure returned to baseline Anesthetic complications: no     Last Vitals:  Vitals:   01/15/18 1215 01/15/18 1245  BP: (!) 146/64 (!) 141/75  Pulse: (!) 50   Resp: 16   Temp: (!) 36.3 C   SpO2: 99% 99%    Last Pain:  Vitals:   01/18/18 0812  TempSrc:   PainSc: 0-No pain                 Aviana Shevlin

## 2018-01-21 ENCOUNTER — Ambulatory Visit: Admit: 2018-01-21 | Payer: Medicare Other | Admitting: Podiatry

## 2018-01-21 SURGERY — OSTEOTOMY, WEIL
Anesthesia: Choice | Laterality: Right

## 2018-02-12 ENCOUNTER — Encounter: Payer: Self-pay | Admitting: Podiatry

## 2018-03-12 ENCOUNTER — Encounter: Payer: Self-pay | Admitting: Family Medicine

## 2018-03-30 ENCOUNTER — Inpatient Hospital Stay: Admission: RE | Admit: 2018-03-30 | Payer: Medicare Other | Source: Ambulatory Visit

## 2018-04-05 ENCOUNTER — Encounter
Admission: RE | Admit: 2018-04-05 | Discharge: 2018-04-05 | Disposition: A | Discharge status: Home / Self Care | Payer: Medicare Other | Source: Ambulatory Visit | Attending: Orthopedic Surgery | Admitting: Orthopedic Surgery

## 2018-04-05 ENCOUNTER — Other Ambulatory Visit: Payer: Self-pay

## 2018-04-05 DIAGNOSIS — I2510 Atherosclerotic heart disease of native coronary artery without angina pectoris: Secondary | ICD-10-CM | POA: Diagnosis not present

## 2018-04-05 DIAGNOSIS — Z880 Allergy status to penicillin: Secondary | ICD-10-CM | POA: Diagnosis not present

## 2018-04-05 DIAGNOSIS — G2581 Restless legs syndrome: Secondary | ICD-10-CM | POA: Diagnosis not present

## 2018-04-05 DIAGNOSIS — Z8711 Personal history of peptic ulcer disease: Secondary | ICD-10-CM | POA: Diagnosis not present

## 2018-04-05 DIAGNOSIS — Z79899 Other long term (current) drug therapy: Secondary | ICD-10-CM | POA: Diagnosis not present

## 2018-04-05 DIAGNOSIS — E1140 Type 2 diabetes mellitus with diabetic neuropathy, unspecified: Secondary | ICD-10-CM | POA: Diagnosis not present

## 2018-04-05 DIAGNOSIS — Z91013 Allergy to seafood: Secondary | ICD-10-CM | POA: Diagnosis not present

## 2018-04-05 DIAGNOSIS — Z888 Allergy status to other drugs, medicaments and biological substances status: Secondary | ICD-10-CM | POA: Diagnosis not present

## 2018-04-05 DIAGNOSIS — Z7982 Long term (current) use of aspirin: Secondary | ICD-10-CM | POA: Diagnosis not present

## 2018-04-05 DIAGNOSIS — I252 Old myocardial infarction: Secondary | ICD-10-CM | POA: Diagnosis not present

## 2018-04-05 DIAGNOSIS — M1811 Unilateral primary osteoarthritis of first carpometacarpal joint, right hand: Secondary | ICD-10-CM | POA: Diagnosis present

## 2018-04-05 DIAGNOSIS — Z955 Presence of coronary angioplasty implant and graft: Secondary | ICD-10-CM | POA: Diagnosis not present

## 2018-04-05 DIAGNOSIS — G4733 Obstructive sleep apnea (adult) (pediatric): Secondary | ICD-10-CM | POA: Diagnosis not present

## 2018-04-05 DIAGNOSIS — Z01812 Encounter for preprocedural laboratory examination: Secondary | ICD-10-CM

## 2018-04-05 DIAGNOSIS — Z9884 Bariatric surgery status: Secondary | ICD-10-CM | POA: Diagnosis not present

## 2018-04-05 DIAGNOSIS — Z6841 Body Mass Index (BMI) 40.0 and over, adult: Secondary | ICD-10-CM | POA: Diagnosis not present

## 2018-04-05 DIAGNOSIS — E785 Hyperlipidemia, unspecified: Secondary | ICD-10-CM | POA: Diagnosis not present

## 2018-04-05 DIAGNOSIS — E669 Obesity, unspecified: Secondary | ICD-10-CM | POA: Diagnosis not present

## 2018-04-05 DIAGNOSIS — Z87891 Personal history of nicotine dependence: Secondary | ICD-10-CM | POA: Diagnosis not present

## 2018-04-05 NOTE — Patient Instructions (Addendum)
Your procedure is scheduled on: Thursday 04/08/18 Report to DAY SURGERY DEPARTMENT LOCATED ON 2ND FLOOR MEDICAL MALL ENTRANCE. To find out your arrival time please call [phone] between 1PM - 3PM on Wednesday 04/07/18.  Remember: Instructions that are not followed completely may result in serious medical risk, up to and including death, or upon the discretion of your surgeon and anesthesiologist your surgery may need to be rescheduled.     _X__ 1. Do not eat food after midnight the night before your procedure.                 No gum chewing or hard candies. You may drink clear liquids up to 2 hours                 before you are scheduled to arrive for your surgery- DO not drink clear                 liquids within 2 hours of the start of your surgery.                 Clear Liquids include:  water, apple juice without pulp, clear carbohydrate                 drink such as Clearfast or Gatorade, Black Coffee or Tea (Do not add                 anything to coffee or tea).  __X__2.  On the morning of surgery brush your teeth with toothpaste and water, you                 may rinse your mouth with mouthwash if you wish.  Do not swallow any              toothpaste of mouthwash.     _X__ 3.  No Alcohol for 24 hours before or after surgery.   _X__ 4.  Do Not Smoke or use e-cigarettes For 24 Hours Prior to Your Surgery.                 Do not use any chewable tobacco products for at least 6 hours prior to                 surgery.  ____  5.  Bring all medications with you on the day of surgery if instructed.   __X__  6.  Notify your doctor if there is any change in your medical condition      (cold, fever, infections).     Do not wear jewelry, make-up, hairpins, clips or nail polish. Do not wear lotions, powders, or perfumes.  Do not shave 48 hours prior to surgery. Men may shave face and neck. Do not bring valuables to the hospital.    [facility] is not responsible for any belongings or  valuables.  Contacts, dentures/partials or body piercings may not be worn into surgery. Bring a case for your contacts, glasses or hearing aids, a denture cup will be supplied. Leave your suitcase in the car. After surgery it may be brought to your room. For patients admitted to the hospital, discharge time is determined by your treatment team.   Patients discharged the day of surgery will not be allowed to drive home.   Please read over the following fact sheets that you were given:   MRSA Information  __X__ Take these medicines the morning of surgery with A SIP OF WATER:  1. LYRICA  2.   3.   4.  5.  6.  ____ Fleet Enema (as directed)   __X__ Use CHG Soap/SAGE wipes as directed  ____ Use inhalers on the day of surgery  ____ Stop metformin/Janumet/Farxiga 2 days prior to surgery    ____ Take 1/2 of usual insulin dose the night before surgery. No insulin the morning          of surgery.   ____ Stop Blood Thinners Coumadin/Plavix/Xarelto/Pleta/Pradaxa/Eliquis/Effient/Aspirin  on   Or contact your Surgeon, Cardiologist or Medical Doctor regarding  ability to stop your blood thinners  __X__ Stop Anti-inflammatories 7 days before surgery such as Advil, Ibuprofen, Motrin,  BC or Goodies Powder, Naprosyn, Naproxen, Aleve, Aspirin    __X__ Stop all herbal supplements, fish oil or vitamin E until after surgery.    ____ Bring C-Pap to the hospital.

## 2018-04-07 MED ORDER — CLINDAMYCIN PHOSPHATE 900 MG/50ML IV SOLN
900.0000 mg | Freq: Once | INTRAVENOUS | Status: AC
  Administered 2018-04-08: 900 mg via INTRAVENOUS

## 2018-04-08 ENCOUNTER — Encounter
Admission: RE | Disposition: A | Discharge status: Home / Self Care | Payer: Self-pay | Source: Ambulatory Visit | Attending: Orthopedic Surgery

## 2018-04-08 ENCOUNTER — Encounter: Payer: Self-pay | Admitting: *Deleted

## 2018-04-08 ENCOUNTER — Ambulatory Visit
Admission: RE | Admit: 2018-04-08 | Discharge: 2018-04-08 | Disposition: A | Discharge status: Home / Self Care | Payer: Medicare Other | Source: Ambulatory Visit | Attending: Orthopedic Surgery | Admitting: Orthopedic Surgery

## 2018-04-08 ENCOUNTER — Other Ambulatory Visit: Payer: Self-pay

## 2018-04-08 ENCOUNTER — Ambulatory Visit: Payer: Medicare Other | Admitting: Anesthesiology

## 2018-04-08 DIAGNOSIS — M19031 Primary osteoarthritis, right wrist: Secondary | ICD-10-CM

## 2018-04-08 DIAGNOSIS — M1811 Unilateral primary osteoarthritis of first carpometacarpal joint, right hand: Secondary | ICD-10-CM | POA: Insufficient documentation

## 2018-04-08 DIAGNOSIS — G2581 Restless legs syndrome: Secondary | ICD-10-CM | POA: Insufficient documentation

## 2018-04-08 DIAGNOSIS — Z8711 Personal history of peptic ulcer disease: Secondary | ICD-10-CM | POA: Insufficient documentation

## 2018-04-08 DIAGNOSIS — Z6841 Body Mass Index (BMI) 40.0 and over, adult: Secondary | ICD-10-CM | POA: Insufficient documentation

## 2018-04-08 DIAGNOSIS — G4733 Obstructive sleep apnea (adult) (pediatric): Secondary | ICD-10-CM | POA: Insufficient documentation

## 2018-04-08 DIAGNOSIS — Z9884 Bariatric surgery status: Secondary | ICD-10-CM | POA: Insufficient documentation

## 2018-04-08 DIAGNOSIS — Z880 Allergy status to penicillin: Secondary | ICD-10-CM | POA: Insufficient documentation

## 2018-04-08 DIAGNOSIS — Z7982 Long term (current) use of aspirin: Secondary | ICD-10-CM | POA: Insufficient documentation

## 2018-04-08 DIAGNOSIS — I252 Old myocardial infarction: Secondary | ICD-10-CM | POA: Insufficient documentation

## 2018-04-08 DIAGNOSIS — Z87891 Personal history of nicotine dependence: Secondary | ICD-10-CM | POA: Insufficient documentation

## 2018-04-08 DIAGNOSIS — Z91013 Allergy to seafood: Secondary | ICD-10-CM | POA: Insufficient documentation

## 2018-04-08 DIAGNOSIS — Z955 Presence of coronary angioplasty implant and graft: Secondary | ICD-10-CM | POA: Insufficient documentation

## 2018-04-08 DIAGNOSIS — Z79899 Other long term (current) drug therapy: Secondary | ICD-10-CM | POA: Insufficient documentation

## 2018-04-08 DIAGNOSIS — Z888 Allergy status to other drugs, medicaments and biological substances status: Secondary | ICD-10-CM | POA: Insufficient documentation

## 2018-04-08 DIAGNOSIS — E1140 Type 2 diabetes mellitus with diabetic neuropathy, unspecified: Secondary | ICD-10-CM | POA: Insufficient documentation

## 2018-04-08 DIAGNOSIS — E669 Obesity, unspecified: Secondary | ICD-10-CM | POA: Insufficient documentation

## 2018-04-08 DIAGNOSIS — E785 Hyperlipidemia, unspecified: Secondary | ICD-10-CM | POA: Insufficient documentation

## 2018-04-08 DIAGNOSIS — I2510 Atherosclerotic heart disease of native coronary artery without angina pectoris: Secondary | ICD-10-CM | POA: Insufficient documentation

## 2018-04-08 HISTORY — PX: CARPOMETACARPAL (CMC) FUSION OF THUMB: SHX6290

## 2018-04-08 SURGERY — CARPOMETACARPAL (CMC) FUSION OF THUMB
Anesthesia: General | Laterality: Right

## 2018-04-08 MED ORDER — PHENYLEPHRINE HCL 10 MG/ML IJ SOLN
INTRAMUSCULAR | Status: DC | PRN
  Administered 2018-04-08 (×3): 100 ug via INTRAVENOUS

## 2018-04-08 MED ORDER — FAMOTIDINE 20 MG PO TABS
20.0000 mg | ORAL_TABLET | Freq: Once | ORAL | Status: AC
  Administered 2018-04-08: 20 mg via ORAL

## 2018-04-08 MED ORDER — FAMOTIDINE 20 MG PO TABS
ORAL_TABLET | ORAL | Status: AC
  Administered 2018-04-08: 20 mg via ORAL
  Filled 2018-04-08: qty 1

## 2018-04-08 MED ORDER — FENTANYL CITRATE (PF) 100 MCG/2ML IJ SOLN
INTRAMUSCULAR | Status: AC
  Filled 2018-04-08: qty 4

## 2018-04-08 MED ORDER — FENTANYL CITRATE (PF) 100 MCG/2ML IJ SOLN
INTRAMUSCULAR | Status: DC | PRN
  Administered 2018-04-08 (×2): 50 ug via INTRAVENOUS
  Administered 2018-04-08: 100 ug via INTRAVENOUS

## 2018-04-08 MED ORDER — OXYCODONE HCL 5 MG PO TABS: 5.0000 mg | ORAL_TABLET | Freq: Once | ORAL | Status: DC | PRN

## 2018-04-08 MED ORDER — ONDANSETRON HCL 4 MG/2ML IJ SOLN
INTRAMUSCULAR | Status: DC | PRN
  Administered 2018-04-08: 4 mg via INTRAVENOUS

## 2018-04-08 MED ORDER — FENTANYL CITRATE (PF) 100 MCG/2ML IJ SOLN
25.0000 ug | INTRAMUSCULAR | Status: DC | PRN
  Administered 2018-04-08 (×2): 50 ug via INTRAVENOUS

## 2018-04-08 MED ORDER — OXYCODONE HCL 5 MG/5ML PO SOLN: 5.0000 mg | Freq: Once | ORAL | Status: DC | PRN

## 2018-04-08 MED ORDER — LACTATED RINGERS IV SOLN
INTRAVENOUS | Status: DC
  Administered 2018-04-08: 06:00:00 via INTRAVENOUS

## 2018-04-08 MED ORDER — GELATIN ABSORBABLE 12-7 MM EX MISC
CUTANEOUS | Status: AC
  Filled 2018-04-08: qty 1

## 2018-04-08 MED ORDER — BUPIVACAINE HCL (PF) 0.5 % IJ SOLN
INTRAMUSCULAR | Status: AC
  Filled 2018-04-08: qty 30

## 2018-04-08 MED ORDER — CLINDAMYCIN PHOSPHATE 900 MG/50ML IV SOLN
INTRAVENOUS | Status: AC
  Filled 2018-04-08: qty 50

## 2018-04-08 MED ORDER — PROMETHAZINE HCL 25 MG/ML IJ SOLN: 6.2500 mg | INTRAMUSCULAR | Status: DC | PRN

## 2018-04-08 MED ORDER — MEPERIDINE HCL 50 MG/ML IJ SOLN: 6.2500 mg | INTRAMUSCULAR | Status: DC | PRN

## 2018-04-08 MED ORDER — GELATIN ABSORBABLE 12-7 MM EX MISC
CUTANEOUS | Status: DC | PRN
  Administered 2018-04-08: 1

## 2018-04-08 MED ORDER — HYDROCODONE-ACETAMINOPHEN 5-325 MG PO TABS
1.0000 | ORAL_TABLET | Freq: Once | ORAL | Status: AC
  Administered 2018-04-08: 1 via ORAL

## 2018-04-08 MED ORDER — LIDOCAINE HCL (CARDIAC) PF 100 MG/5ML IV SOSY
PREFILLED_SYRINGE | INTRAVENOUS | Status: DC | PRN
  Administered 2018-04-08: 100 mg via INTRAVENOUS

## 2018-04-08 MED ORDER — HYDROCODONE-ACETAMINOPHEN 5-325 MG PO TABS
ORAL_TABLET | ORAL | Status: AC
  Filled 2018-04-08: qty 1

## 2018-04-08 MED ORDER — PROPOFOL 10 MG/ML IV BOLUS
INTRAVENOUS | Status: DC | PRN
  Administered 2018-04-08: 150 mg via INTRAVENOUS

## 2018-04-08 MED ORDER — SEVOFLURANE IN SOLN
RESPIRATORY_TRACT | Status: AC
  Filled 2018-04-08: qty 250

## 2018-04-08 MED ORDER — PROPOFOL 10 MG/ML IV BOLUS
INTRAVENOUS | Status: AC
  Filled 2018-04-08: qty 20

## 2018-04-08 MED ORDER — GELATIN ABSORBABLE 100 CM EX MISC
CUTANEOUS | Status: AC
  Filled 2018-04-08: qty 1

## 2018-04-08 MED ORDER — HYDROCODONE-ACETAMINOPHEN 5-325 MG PO TABS: 1.0000 | ORAL_TABLET | Freq: Four times a day (QID) | ORAL | 0 refills | Status: DC | PRN

## 2018-04-08 MED ORDER — FENTANYL CITRATE (PF) 100 MCG/2ML IJ SOLN
INTRAMUSCULAR | Status: AC
  Filled 2018-04-08: qty 2

## 2018-04-08 MED ORDER — BUPIVACAINE HCL (PF) 0.5 % IJ SOLN
INTRAMUSCULAR | Status: DC | PRN
  Administered 2018-04-08: 10 mL

## 2018-04-08 MED ORDER — DEXAMETHASONE SODIUM PHOSPHATE 10 MG/ML IJ SOLN
INTRAMUSCULAR | Status: DC | PRN
  Administered 2018-04-08: 10 mg via INTRAVENOUS

## 2018-04-08 SURGICAL SUPPLY — 32 items
BANDAGE ELASTIC 3 LF NS (GAUZE/BANDAGES/DRESSINGS) ×3 IMPLANT
BLADE OSC/SAGITTAL 5.5X25 (BLADE) ×3 IMPLANT
BNDG CONFORM 3 STRL LF (GAUZE/BANDAGES/DRESSINGS) ×3 IMPLANT
CAST PADDING 3X4FT ST 30246 (SOFTGOODS) ×2
COVER WAND RF STERILE (DRAPES) ×3 IMPLANT
CUFF TOURN 18 STER (MISCELLANEOUS) IMPLANT
CUFF TOURN 24 STER (MISCELLANEOUS) ×2 IMPLANT
DRAPE FLUOR MINI C-ARM 54X84 (DRAPES) ×3 IMPLANT
ELECT CAUTERY BLADE 6.4 (BLADE) ×3 IMPLANT
GAUZE PETRO XEROFOAM 1X8 (MISCELLANEOUS) ×3 IMPLANT
GAUZE SPONGE 4X4 12PLY STRL (GAUZE/BANDAGES/DRESSINGS) ×3 IMPLANT
GLOVE SURG SYN 9.0  PF PI (GLOVE) ×2
GLOVE SURG SYN 9.0 PF PI (GLOVE) ×1 IMPLANT
GOWN SRG 2XL LVL 4 RGLN SLV (GOWNS) ×1 IMPLANT
GOWN STRL NON-REIN 2XL LVL4 (GOWNS) ×2
GOWN STRL REUS W/ TWL LRG LVL3 (GOWN DISPOSABLE) ×1 IMPLANT
GOWN STRL REUS W/TWL LRG LVL3 (GOWN DISPOSABLE) ×2
KIT TURNOVER KIT A (KITS) ×3 IMPLANT
NS IRRIG 500ML POUR BTL (IV SOLUTION) ×3 IMPLANT
PACK EXTREMITY ARMC (MISCELLANEOUS) ×3 IMPLANT
PAD CAST CTTN 3X4 STRL (SOFTGOODS) ×1 IMPLANT
SCALPEL PROTECTED #15 DISP (BLADE) ×6 IMPLANT
SPLINT CAST 1 STEP 3X12 (MISCELLANEOUS) ×3 IMPLANT
SPLINT WRIST M LT TX990308 (SOFTGOODS) ×3 IMPLANT
SUT ETHILON 4-0 (SUTURE) ×2
SUT ETHILON 4-0 FS2 18XMFL BLK (SUTURE) ×1
SUT VIC AB 0 CT2 27 (SUTURE) ×6 IMPLANT
SUT VIC AB 3-0 SH 27 (SUTURE) ×2
SUT VIC AB 3-0 SH 27X BRD (SUTURE) ×1 IMPLANT
SUTURE ETHLN 4-0 FS2 18XMF BLK (SUTURE) ×1 IMPLANT
SYSTEM IMPLANT TIGHTROPE MINI (Anchor) ×3 IMPLANT
WIRE Z .062 C-WIRE SPADE TIP (WIRE) ×6 IMPLANT

## 2018-04-08 NOTE — Anesthesia Procedure Notes (Signed)
Procedure Name: LMA Insertion Date/Time: 04/08/2018 7:48 AM Performed by: Junious Silk, CRNA Pre-anesthesia Checklist: Patient identified, Patient being monitored, Timeout performed, Emergency Drugs available and Suction available Patient Re-evaluated:Patient Re-evaluated prior to induction Oxygen Delivery Method: Circle system utilized Preoxygenation: Pre-oxygenation with 100% oxygen Induction Type: IV induction Ventilation: Mask ventilation without difficulty LMA: LMA inserted LMA Size: 4.5 Tube type: Oral Number of attempts: 1 Placement Confirmation: positive ETCO2 and breath sounds checked- equal and bilateral Tube secured with: Tape Dental Injury: Teeth and Oropharynx as per pre-operative assessment

## 2018-04-08 NOTE — Anesthesia Post-op Follow-up Note (Signed)
Anesthesia QCDR form completed.        

## 2018-04-08 NOTE — Anesthesia Postprocedure Evaluation (Signed)
Anesthesia Post Note  Patient: Brian Kramer  Procedure(s) Performed: CARPOMETACARPAL (CMC) FUSION OF THUMB (Right )  Patient location during evaluation: PACU Anesthesia Type: General Level of consciousness: awake and alert and oriented Pain management: pain level controlled Vital Signs Assessment: post-procedure vital signs reviewed and stable Respiratory status: spontaneous breathing, nonlabored ventilation and respiratory function stable Cardiovascular status: blood pressure returned to baseline and stable Postop Assessment: no signs of nausea or vomiting Anesthetic complications: no     Last Vitals:  Vitals:   04/08/18 0957 04/08/18 1020  BP: 124/62 133/60  Pulse: 64 65  Resp: 16 16  Temp: (!) 36.3 C   SpO2: 94% 97%    Last Pain:  Vitals:   04/08/18 1020  TempSrc:   PainSc: 5                  Moshe Wenger

## 2018-04-08 NOTE — Discharge Instructions (Addendum)
Keep arm elevated as much as possible through the weekend.  Ice to the back of the hand as needed.  Pain medicine as directed.  Keep splint and dressing clean and dry.  Okay to work fingers as much as possible but try not to move the thumb.   AMBULATORY SURGERY  DISCHARGE INSTRUCTIONS   1) The drugs that you were given will stay in your system until tomorrow so for the next 24 hours you should not:  A) Drive an automobile B) Make any legal decisions C) Drink any alcoholic beverage   2) You may resume regular meals tomorrow.  Today it is better to start with liquids and gradually work up to solid foods.  You may eat anything you prefer, but it is better to start with liquids, then soup and crackers, and gradually work up to solid foods.   3) Please notify your doctor immediately if you have any unusual bleeding, trouble breathing, redness and pain at the surgery site, drainage, fever, or pain not relieved by medication.    4) Additional Instructions:        Please contact your physician with any problems or Same Day Surgery at [phone], Monday through Friday 6 am to 4 pm, or [place] at [facility] number at [phone].

## 2018-04-08 NOTE — Anesthesia Preprocedure Evaluation (Addendum)
Anesthesia Evaluation  Patient identified by MRN, date of birth, ID band Patient awake    Reviewed: Allergy & Precautions, NPO status , Patient's Chart, lab work & pertinent test results  History of Anesthesia Complications Negative for: history of anesthetic complications  Airway Mallampati: II  TM Distance: >3 FB Neck ROM: Full    Dental  (+) Edentulous Upper, Edentulous Lower   Pulmonary sleep apnea , neg COPD, former smoker,    breath sounds clear to auscultation- rhonchi (-) wheezing      Cardiovascular + CAD, + Past MI and + Cardiac Stents  (-) CABG  Rhythm:Regular Rate:Normal - Systolic murmurs and - Diastolic murmurs    Neuro/Psych  Headaches, negative psych ROS   GI/Hepatic Neg liver ROS, hiatal hernia, GERD  ,  Endo/Other  diabetes (no meds since gastric bypass)  Renal/GU negative Renal ROS     Musculoskeletal  (+) Arthritis ,   Abdominal (+) + obese,   Peds  Hematology negative hematology ROS (+)   Anesthesia Other Findings Past Medical History: No date: Anginal pain (HCC) No date: Arthritis No date: Coronary artery disease No date: Diabetes mellitus without complication (HCC)     Comment:  Took Metformin for a couple of years. Had bariatric               surgery and no longer requires it. A1-C was 5.5 in               October 2018. No date: GERD (gastroesophageal reflux disease) No date: Headache No date: History of hiatal hernia No date: Myocardial infarction (HCC) No date: Peripheral neuropathy No date: Sleep apnea     Comment:  No requires the use of CPAP   Reproductive/Obstetrics                            Anesthesia Physical Anesthesia Plan  ASA: III  Anesthesia Plan: General   Post-op Pain Management:    Induction: Intravenous  PONV Risk Score and Plan: 1 and Ondansetron and Midazolam  Airway Management Planned: LMA  Additional Equipment:   Intra-op  Plan:   Post-operative Plan:   Informed Consent: I have reviewed the patients History and Physical, chart, labs and discussed the procedure including the risks, benefits and alternatives for the proposed anesthesia with the patient or authorized representative who has indicated his/her understanding and acceptance.   Dental advisory given  Plan Discussed with: CRNA and Anesthesiologist  Anesthesia Plan Comments:         Anesthesia Quick Evaluation

## 2018-04-08 NOTE — H&P (Signed)
Reviewed paper H+P, will be scanned into chart. No changes noted.  

## 2018-04-08 NOTE — Transfer of Care (Signed)
Immediate Anesthesia Transfer of Care Note  Patient: Brian Kramer  Procedure(s) Performed: CARPOMETACARPAL (CMC) FUSION OF THUMB (Right )  Patient Location: PACU  Anesthesia Type:General  Level of Consciousness: awake, oriented and sedated  Airway & Oxygen Therapy: Patient Spontanous Breathing and Patient connected to face mask oxygen  Post-op Assessment: Report given to RN and Post -op Vital signs reviewed and stable  Post vital signs: Reviewed and stable  Last Vitals:  Vitals Value Taken Time  BP    Temp    Pulse 63 04/08/2018  8:58 AM  Resp 10 04/08/2018  8:58 AM  SpO2 99 % 04/08/2018  8:58 AM  Vitals shown include unvalidated device data.  Last Pain:  Vitals:   04/08/18 0603  TempSrc: Temporal  PainSc: 0-No pain         Complications: No apparent anesthesia complications

## 2018-04-08 NOTE — Op Note (Signed)
04/08/2018  9:01 AM  PATIENT:  Brian Kramer  72 y.o. 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  PROCEDURE:  Procedure(s): CARPOMETACARPAL (CMC) FUSION OF THUMB (Right) MC arthroplasty not fusion right thumb  SURGEON: Leitha Schuller, MD  ASSISTANTS: None  ANESTHESIA:   general  EBL:  Total I/O In: 800 [I.V.:800] Out: 5 [Blood:5]  BLOOD ADMINISTERED:none  DRAINS: none   LOCAL MEDICATIONS USED:  MARCAINE     SPECIMEN:  No Specimen  DISPOSITION OF SPECIMEN:  N/A  COUNTS:  YES  TOURNIQUET: 55 minutes at 250 mmHg  IMPLANTS: TArthrex mini tight rope  DICTATION: .Dragon Dictation patient was brought to the operating room and after adequate general anesthesia was obtained the right arm was prepped and draped in usual sterile fashion with a tourniquet applied to the upper arm.  After patient identification and timeout procedure were completed, tourniquet was raised.  Incision was made over the radial side of the thumb CMC joint at the border between the dorsal and palmar skin.  Subcutaneous nerves were identified and protected and the capsule exposed and split.  There is then elevated in the trapezium identified.  It was removed in pieces with mini C arm being used to assess removal including a large spur between the first and second metacarpal bases.  After removal of the trapezium the thumb was held with slight traction and K wire inserted from the base of the thumb across to the base of the second metacarpal with a small incision was made and the wire pulled out with the mini tight rope attached coming through with the fixation device at the base of the first metacarpal.  The metacarpal is held in a reduced position and some opposition when the mini tight rope device was then tightened at the base of the second metacarpal and  appeared to be at the appropriate level and was stable to pistoning under direct visualization under under and under fluoroscopy.  With this in the right position further night knots were placed on the mini tight rope at the base of the second metacarpal and the sutures cut.  The wound was then thoroughly irrigated and 10-10 6 cc of half percent Sensorcaine plain were infiltrated around the joint and into the skin for postop analgesia.  Gelfoam was inserted into the defect where the trapezium had been removed and the capsule was repaired with 0 Vicryl.  3-0 Vicryl subcutaneously and 4-0 nylon for the skin followed by Xeroform the dorsal hand incision was also closed with simple interrupted 4-0 nylon with Xeroform 4 x 4's and a thumb spica splint applied let down at the close of the case  PLAN OF CARE: Discharge to home after PACU  PATIENT DISPOSITION:  PACU - hemodynamically stable.

## 2018-04-14 ENCOUNTER — Other Ambulatory Visit: Payer: Self-pay | Admitting: Otolaryngology

## 2018-04-14 DIAGNOSIS — H9041 Sensorineural hearing loss, unilateral, right ear, with unrestricted hearing on the contralateral side: Secondary | ICD-10-CM

## 2018-04-14 DIAGNOSIS — IMO0001 Reserved for inherently not codable concepts without codable children: Secondary | ICD-10-CM

## 2018-04-27 ENCOUNTER — Ambulatory Visit: Payer: Medicare Other | Attending: Orthopedic Surgery | Admitting: Occupational Therapy

## 2018-04-27 ENCOUNTER — Other Ambulatory Visit: Payer: Self-pay

## 2018-04-27 ENCOUNTER — Encounter: Payer: Self-pay | Admitting: Occupational Therapy

## 2018-04-27 DIAGNOSIS — M25641 Stiffness of right hand, not elsewhere classified: Secondary | ICD-10-CM | POA: Diagnosis not present

## 2018-04-27 DIAGNOSIS — M79644 Pain in right finger(s): Secondary | ICD-10-CM | POA: Diagnosis present

## 2018-04-27 DIAGNOSIS — M6281 Muscle weakness (generalized): Secondary | ICD-10-CM | POA: Insufficient documentation

## 2018-04-27 DIAGNOSIS — L905 Scar conditions and fibrosis of skin: Secondary | ICD-10-CM | POA: Diagnosis present

## 2018-04-27 DIAGNOSIS — M25631 Stiffness of right wrist, not elsewhere classified: Secondary | ICD-10-CM | POA: Diagnosis present

## 2018-04-27 NOTE — Patient Instructions (Addendum)
Pt to do contrast to decrease edema and pain   Scar massage ed on and cica scar pad for night time use   Tendon glides - AROM 10 reps  2 -3 x day And IP of thumb block AROM 10reps  2- 3 x day  Keep splint on at all times - take off only as order by surgeon

## 2018-04-27 NOTE — Therapy (Signed)
[facility] SP[facility]TS MEDICINE [address] [place], [place], [address] Phone: [phone]   Fax:  [phone]  Occupational Therapy Evaluation  Patient Details  Name: Brian CoddingClinton Spoon MRN: [id number] Date of Birth: 05/01/1946 Referring Provider (OT): Rosita KeaMenz   Encounter Date: 04/27/2018  OT End of Session - 04/27/18 1224    Visit Number  1    Number of Visits  12    Date for OT Re-Evaluation  06/22/18    OT Start Time  0820    OT Stop Time  0900    OT Time Calculation (min)  40 min    Activity Tolerance  Patient tolerated treatment well    Behavior During Therapy  [facility] for tasks assessed/performed       Past Medical History:  Diagnosis Date  . Anginal pain (HCC)   . Arthritis   . Coronary artery disease   . Diabetes mellitus without complication (HCC)    Took Metformin for a couple of years. Had bariatric surgery and no longer requires it. A1-C was 5.5 in October 2018.  Marland Kitchen. GERD (gastroesophageal reflux disease)   . Headache   . History of hiatal hernia   . Myocardial infarction (HCC)   . Peripheral neuropathy   . Sleep apnea    No requires the use of CPAP    Past Surgical History:  Procedure Laterality Date  . arthroscpic knee 2005 Right   . Cardiac Stent 1997    . Cardiac Stent 2000    . CARPOMETACARPAL (CMC) FUSION OF THUMB Right 04/08/2018   Procedure: CARPOMETACARPAL [facility](CMC) FUSION OF THUMB;  Surgeon: Kennedy BuckerMenz, Michael, MD;  Location: [facility] ORS;  Service: Orthopedics;  Laterality: Right;  . COLONOSCOPY    . CORONARY ANGIOPLASTY    . EYE SURGERY  2015   Bilateral cataract  . Gastric Sleeve Bariatric Surgery  2017  . HAMMER TOE SURGERY Right 01/15/2018   Procedure: HAMMER TOE CORRECTION-2ND & 3RD;  Surgeon: Recardo Evangelistroxler, Matthew, DPM;  Location: [facility] ORS;  Service: Podiatry;  Laterality: Right;  . HERNIA REPAIR    . JOINT REPLACEMENT  2007   Left knee  . JOINT REPLACEMENT  2015   Right Knee  . left knee arthroscopy 2006 Left   .  Right Heel Spur Resection 2012    . Right shoulder AC separation surgery in 1995 Right   . Stem Cells to left heel  2017  . TONSILLECTOMY    . UPPER GI ENDOSCOPY  2017  . Vasectomy 1970's    . WEIL OSTEOTOMY Right 01/15/2018   Procedure: WEIL OSTEOTOMY-2ND & 3RD;  Surgeon: Recardo Evangelistroxler, Matthew, DPM;  Location: [facility] ORS;  Service: Podiatry;  Laterality: Right;    There were no vitals filed for this visit.  Subjective Assessment - 04/27/18 1217    Subjective   I had trouble with my thumb for years and pain - other one bother me too - I did not use my hand yet - take if off when showering and when sitting - sterristrips did not come off yet     Patient Stated Goals  Want to pain in my R thumb and hand better to be able to use it more - I love to play on computer, and cook     Currently in Pain?  Yes    Pain Score  2     Pain Location  Hand    Pain Orientation  Right    Pain Descriptors / Indicators  Aching  Pain Type  Surgical pain    Pain Onset  1 to 4 weeks ago    Pain Frequency  Occasional    Aggravating Factors   if I move my thumb the wrong way         [facility] OT Assessment - 04/27/18 0001      Assessment   Medical Diagnosis  R CMC athroplasty    Referring Provider (OT)  Rosita Kea    Onset Date/Surgical Date  04/08/18    Hand Dominance  Right      Precautions   Required Braces or Orthoses  --   prefab thumb spica      Home  Environment   Lives With  Spouse      Prior Function   Vocation  Retired    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, cook  and some house work      Right Hand AROM   R Thumb IP 0-80  40 Degrees    R Index  MCP 0-90  75 Degrees    R Index PIP 0-100  100 Degrees    R Long  MCP 0-90  80 Degrees    R Long PIP 0-100  100 Degrees    R Ring  MCP 0-90  90 Degrees    R Ring PIP 0-100  100 Degrees    R Little  MCP 0-90  90 Degrees    R Little PIP 0-100  100 Degrees          ed on doing contrast  Scar massage ed on and cica scar pad for night time use    Tendon glides - AROM 10 reps  2 -3 x day And IP of thumb block AROM 10reps  2- 3 x day  Keep splint on at all times - take off only as order by surgeon            OT Education - 04/27/18 1224    Education Details  findings , protocol , POC and plan     Person(s) Educated  Patient    Methods  Explanation;Demonstration;Handout    Comprehension  Verbalized understanding;Returned demonstration       OT Short Term Goals - 04/27/18 1229      OT SHORT TERM GOAL #1   Title  Pt to be ind in HEP to decrease scar tissue, edema and pain and increase AROM to use hand in 50% or more of ADL's     Baseline  in thumb spica and no knowledge of HEP     Time  4    Period  Weeks    Status  New    Target Date  05/25/18      OT SHORT TERM GOAL #2   Title  Pain on PRWHE improve with more than 20 points     Baseline  Pain on PRWHE at eval 36/50 - cannot lift or use hand repetitive yet     Time  6    Period  Weeks    Status  New    Target Date  06/08/18      OT SHORT TERM GOAL #3   Title  R hand digits flexion improve for pt to touch palm without increase syptoms     Baseline  Flexion of 2nd 75 and 3rd 80 with increase pain - cannot touch palm     Time  4    Period  Weeks    Status  New  Target Date  05/25/18        OT Long Term Goals - 04/27/18 1232      OT LONG TERM GOAL #1   Title  R thumb AROM improve in PA and RA and flexion to grap empy cup and retrieve 1 inch object out of palm     Baseline  NT yet- 2 1/1 wks s/p    Time  6    Period  Weeks    Status  New    Target Date  05/25/18      OT LONG TERM GOAL #2   Title  R wrist AROM improve for pt to use R hand in more than 50%  bath L side of body, apply deodorant and dry with towel    Baseline  Wrist NT - 2 1/2 wks s/p    Time  4    Period  Weeks    Status  New    Target Date  05/25/18      OT LONG TERM GOAL #3   Title  Grip strength and prehension in R hand improve to more than 40% of L hand to do buttons, cut  food, carry more than 5 lbs without increase symptoms     Baseline  NT 2 1/2 wks s/p     Time  8    Period  Weeks    Status  New    Target Date  06/22/18      OT LONG TERM GOAL #4   Title  Function on PRWHE improve with more than 20 points     Baseline  function score on PRWHE 45/50    Time  8    Period  Weeks    Status  New    Target Date  06/22/18            Plan - 04/27/18 1225    Clinical Impression Statement  Pt present 2 1/2 wks s/p R thumb CMC arthroplasty - pt in prefab thumb spica- strerristrips still in place on both scars - removed and pt ed on scar massage and use of scarpad - per protocol did only AROM to digits and thumb IP flexion- will hold off on thumb and wrist ROM  until 4 wks per protocol - pt  ed on HEP - and show increase pain , edema and will have decrease ROM and strength issues limiting his functional use in ADL's and IADL's using R dominant hand     Occupational performance deficits (Please refer to evaluation for details):  ADL's;IADL's;Play;Leisure;Social Participation    Rehab Potential  Good    OT Frequency  --   1-2 x wk and then biweekly    OT Duration  8 weeks    OT Treatment/Interventions  Self-care/ADL training;Therapeutic exercise;Patient/family education;Splinting;Paraffin;Fluidtherapy;Contrast Bath;Manual Therapy;Passive range of motion;Functional Mobility Training;Scar mobilization    Plan  assess progress with HEP for digits and scar - initiate at 4 wks AROM to thumb and wrist - with pain less than 1-2/10    Clinical Decision Making  Multiple treatment options, significant modification of task necessary    OT Home Exercise Plan  see pt instruction    Consulted and Agree with Plan of Care  Patient       Patient will benefit from skilled therapeutic intervention in order to improve the following deficits and impairments:  Pain, Impaired flexibility, Increased edema, Decreased scar mobility, Decreased coordination, Decreased range of motion,  Decreased strength, Impaired UE functional use, Decreased knowledge  of precautions  Visit Diagnosis: Stiffness of right hand, not elsewhere classified - Plan: Ot plan of care cert/re-cert  Pain in right finger(s) - Plan: Ot plan of care cert/re-cert  Stiffness of right wrist, not elsewhere classified - Plan: Ot plan of care cert/re-cert  Scar condition and fibrosis of skin - Plan: Ot plan of care cert/re-cert  Muscle weakness (generalized) - Plan: Ot plan of care cert/re-cert    Problem List There are no active problems to display for this patient.   Oletta Cohn OTR/L,CLT 04/27/2018, 12:39 PM  [place] [facility] [address] [place], [address] Phone: [phone]   Fax:  [phone]  Name: Keaundre Thelin MRN: [id number] Date of Birth: Jul 10, 1945

## 2018-05-04 ENCOUNTER — Ambulatory Visit
Admission: RE | Admit: 2018-05-04 | Discharge: 2018-05-04 | Disposition: A | Discharge status: Home / Self Care | Payer: Medicare Other | Source: Ambulatory Visit | Attending: Otolaryngology | Admitting: Otolaryngology

## 2018-05-04 DIAGNOSIS — M899 Disorder of bone, unspecified: Secondary | ICD-10-CM | POA: Diagnosis not present

## 2018-05-04 DIAGNOSIS — IMO0001 Reserved for inherently not codable concepts without codable children: Secondary | ICD-10-CM

## 2018-05-04 DIAGNOSIS — H9041 Sensorineural hearing loss, unilateral, right ear, with unrestricted hearing on the contralateral side: Secondary | ICD-10-CM | POA: Diagnosis present

## 2018-05-04 LAB — POCT I-STAT CREATININE: CREATININE: 1.1 mg/dL (ref 0.61–1.24)

## 2018-05-04 MED ORDER — GADOBENATE DIMEGLUMINE 529 MG/ML IV SOLN: 10.0000 mL | Freq: Once | INTRAVENOUS | Status: DC | PRN

## 2018-05-04 MED ORDER — GADOBUTROL 1 MMOL/ML IV SOLN
10.0000 mL | Freq: Once | INTRAVENOUS | Status: AC | PRN
  Administered 2018-05-04: 10 mL via INTRAVENOUS

## 2018-05-05 ENCOUNTER — Ambulatory Visit: Payer: Medicare Other | Admitting: Occupational Therapy

## 2018-05-05 DIAGNOSIS — M25641 Stiffness of right hand, not elsewhere classified: Secondary | ICD-10-CM | POA: Diagnosis not present

## 2018-05-05 DIAGNOSIS — M6281 Muscle weakness (generalized): Secondary | ICD-10-CM

## 2018-05-05 DIAGNOSIS — M25631 Stiffness of right wrist, not elsewhere classified: Secondary | ICD-10-CM

## 2018-05-05 DIAGNOSIS — L905 Scar conditions and fibrosis of skin: Secondary | ICD-10-CM

## 2018-05-05 DIAGNOSIS — M79644 Pain in right finger(s): Secondary | ICD-10-CM

## 2018-05-05 NOTE — Patient Instructions (Signed)
Same Scar massage  Add -AROM thumb PA, RA , thumb MP flexion block   10 reps Opposition pick up 1 cm object - alternate digits 5 reps   Wrist AROM for flexion, ext, RD, UD - 10 reps  2-3 x day  Pain free after heat  And use ice as needed   still sleep with splint and with  act that cause pain or resistance to thumb

## 2018-05-05 NOTE — Therapy (Signed)
[facility] [address] [place], [address] Phone: [phone]   Fax:  [phone]  Occupational Therapy Treatment  Patient Details  Name: Brian Kramer MRN: [id number] Date of Birth: 07-28-1945 Referring Provider (OT): Rosita Kea   Encounter Date: 05/05/2018  OT End of Session - 05/05/18 1653    Visit Number  2    Number of Visits  12    Date for OT Re-Evaluation  06/22/18    OT Start Time  1304    OT Stop Time  1340    OT Time Calculation (min)  36 min    Activity Tolerance  Patient tolerated treatment well    Behavior During Therapy  [facility] for tasks assessed/performed       Past Medical History:  Diagnosis Date  . Anginal pain (HCC)   . Arthritis   . Coronary artery disease   . Diabetes mellitus without complication (HCC)    Took Metformin for a couple of years. Had bariatric surgery and no longer requires it. A1-C was 5.5 in October 2018.  Marland Kitchen GERD (gastroesophageal reflux disease)   . Headache   . History of hiatal hernia   . Myocardial infarction (HCC)   . Peripheral neuropathy   . Sleep apnea    No requires the use of CPAP    Past Surgical History:  Procedure Laterality Date  . arthroscpic knee 2005 Right   . Cardiac Stent 1997    . Cardiac Stent 2000    . CARPOMETACARPAL ([facility]) FUSION OF THUMB Right 04/08/2018   Procedure: CARPOMETACARPAL [facility]) FUSION OF THUMB;  Surgeon: Kennedy Bucker, MD;  Location: [facility] ORS;  Service: Orthopedics;  Laterality: Right;  . COLONOSCOPY    . CORONARY ANGIOPLASTY    . EYE SURGERY  2015   Bilateral cataract  . Gastric Sleeve Bariatric Surgery  2017  . HAMMER TOE SURGERY Right 01/15/2018   Procedure: HAMMER TOE CORRECTION-2ND & 3RD;  Surgeon: Recardo Evangelist, DPM;  Location: [facility] ORS;  Service: Podiatry;  Laterality: Right;  . HERNIA REPAIR    . JOINT REPLACEMENT  2007   Left knee  . JOINT REPLACEMENT  2015   Right Knee  . left knee arthroscopy 2006 Left   .  Right Heel Spur Resection 2012    . Right shoulder AC separation surgery in 1995 Right   . Stem Cells to left heel  2017  . TONSILLECTOMY    . UPPER GI ENDOSCOPY  2017  . Vasectomy 1970's    . WEIL OSTEOTOMY Right 01/15/2018   Procedure: WEIL OSTEOTOMY-2ND & 3RD;  Surgeon: Recardo Evangelist, DPM;  Location: [facility] ORS;  Service: Podiatry;  Laterality: Right;    There were no vitals filed for this visit.  Subjective Assessment - 05/05/18 1650    Subjective   I am doing okay - no problems with exercises and no pain really - sleeping with splint and when out and about - scar I think looks good - I like the scar pad for night time     Patient Stated Goals  Want to pain in my R thumb and hand better to be able to use it more - I love to play on computer, and cook     Currently in Pain?  No/denies         [facility] OT Assessment - 05/05/18 0001      AROM   Right Wrist Extension  50 Degrees    Right Wrist Flexion  68 Degrees    Right Wrist Radial Deviation  25 Degrees    Right Wrist Ulnar Deviation  26 Degrees    Left Wrist Extension  70 Degrees    Left Wrist Flexion  85 Degrees      Right Hand AROM   R Thumb MCP 0-60  35 Degrees    R Thumb IP 0-80  50 Degrees    R Thumb Radial ABduction/ADduction 0-55  46    R Thumb Palmar ABduction/ADduction 0-45  46    R Thumb Opposition to Index  --   opposition to 4th - by no MP flexion    R Index  MCP 0-90  80 Degrees    R Long  MCP 0-90  90 Degrees        MEasurement taken - see flowsheet-  Scar assess and moving great - cica scar pad new one provided and to cont scar massage          OT Treatments/Exercises (OP) - 05/05/18 0001      RUE Fluidotherapy   Number Minutes Fluidotherapy  10 Minutes    RUE Fluidotherapy Location  Hand;Wrist    Comments  AROM to wrist and thumb prior to review of HEP - to increase ROM        Review and add new HEP  AROM thumb PA, RA ( can try gently to flatten palm - but pull less than 1/10 )   ,  thumb MP flexion block  And to cont with IP flexion block   10 reps Opposition pick up 1 cm object - alternate digits 5 reps   can do in few days to tips of digits  - if there is and bend in MP of thumb   Wrist AROM for flexion, ext, RD, UD - 10 reps  2-3 x day  Pain free after heat  And use ice as needed   still sleep with splint and with  act that cause pain or resistance to thumb            OT Short Term Goals - 04/27/18 1229      OT SHORT TERM GOAL #1   Title  Pt to be ind in HEP to decrease scar tissue, edema and pain and increase AROM to use hand in 50% or more of ADL's     Baseline  in thumb spica and no knowledge of HEP     Time  4    Period  Weeks    Status  New    Target Date  05/25/18      OT SHORT TERM GOAL #2   Title  Pain on PRWHE improve with more than 20 points     Baseline  Pain on PRWHE at eval 36/50 - cannot lift or use hand repetitive yet     Time  6    Period  Weeks    Status  New    Target Date  06/08/18      OT SHORT TERM GOAL #3   Title  R hand digits flexion improve for pt to touch palm without increase syptoms     Baseline  Flexion of 2nd 75 and 3rd 80 with increase pain - cannot touch palm     Time  4    Period  Weeks    Status  New    Target Date  05/25/18        OT Long Term Goals - 04/27/18 1232  OT LONG TERM GOAL #1   Title  R thumb AROM improve in PA and RA and flexion to grap empy cup and retrieve 1 inch object out of palm     Baseline  NT yet- 2 1/1 wks s/p    Time  6    Period  Weeks    Status  New    Target Date  05/25/18      OT LONG TERM GOAL #2   Title  R wrist AROM improve for pt to use R hand in more than 50%  bath L side of body, apply deodorant and dry with towel    Baseline  Wrist NT - 2 1/2 wks s/p    Time  4    Period  Weeks    Status  New    Target Date  05/25/18      OT LONG TERM GOAL #3   Title  Grip strength and prehension in R hand improve to more than 40% of L hand to do buttons, cut food,  carry more than 5 lbs without increase symptoms     Baseline  NT 2 1/2 wks s/p     Time  8    Period  Weeks    Status  New    Target Date  06/22/18      OT LONG TERM GOAL #4   Title  Function on PRWHE improve with more than 20 points     Baseline  function score on PRWHE 45/50    Time  8    Period  Weeks    Status  New    Target Date  06/22/18            Plan - 05/05/18 1654    Clinical Impression Statement  Pt present at OT one day off from 4 wks s/p R thumb CMC athroplasty - Dr Ramon DredgeMernz provided this OT with order to start earlier AROM with tightrope tech - pt show good progress since last time iwth scar massage ,digits flexion and thumb IP - pain still greatly improve - intiate this date AROM to all thumb joints - with focus on MP flexion and  wrist AROM -    Occupational performance deficits (Please refer to evaluation for details):  ADL's;IADL's;Play;Leisure;Social Participation    Rehab Potential  Good    OT Frequency  --   1-2 x wk    OT Duration  6 weeks    OT Treatment/Interventions  Self-care/ADL training;Therapeutic exercise;Patient/family education;Splinting;Paraffin;Fluidtherapy;Contrast Bath;Manual Therapy;Passive range of motion;Functional Mobility Training;Scar mobilization    Plan  assess AROM progress -and upgrade to PROM gentle     Clinical Decision Making  Multiple treatment options, significant modification of task necessary    OT Home Exercise Plan  see pt instruction    Consulted and Agree with Plan of Care  Patient       Patient will benefit from skilled therapeutic intervention in order to improve the following deficits and impairments:  Pain, Impaired flexibility, Increased edema, Decreased scar mobility, Decreased coordination, Decreased range of motion, Decreased strength, Impaired UE functional use, Decreased knowledge of precautions  Visit Diagnosis: Stiffness of right hand, not elsewhere classified  Pain in right finger(s)  Stiffness of right  wrist, not elsewhere classified  Scar condition and fibrosis of skin  Muscle weakness (generalized)    Problem List There are no active problems to display for this patient.   Oletta CohnuPreez, Bradan Congrove OTR/L,CLT 05/05/2018, 4:58 PM  [facility] [address] [place], [place], [address] Phone: [phone]   Fax:  [phone]  Name: Brian Kramer MRN: [id number] Date of Birth: Oct 12, 1945

## 2018-05-13 ENCOUNTER — Ambulatory Visit: Payer: Medicare Other | Attending: Orthopedic Surgery | Admitting: Occupational Therapy

## 2018-05-13 DIAGNOSIS — M25631 Stiffness of right wrist, not elsewhere classified: Secondary | ICD-10-CM | POA: Insufficient documentation

## 2018-05-13 DIAGNOSIS — M79644 Pain in right finger(s): Secondary | ICD-10-CM | POA: Insufficient documentation

## 2018-05-13 DIAGNOSIS — M25641 Stiffness of right hand, not elsewhere classified: Secondary | ICD-10-CM

## 2018-05-13 DIAGNOSIS — L905 Scar conditions and fibrosis of skin: Secondary | ICD-10-CM | POA: Diagnosis present

## 2018-05-13 DIAGNOSIS — M6281 Muscle weakness (generalized): Secondary | ICD-10-CM | POA: Diagnosis present

## 2018-05-13 NOTE — Patient Instructions (Signed)
Cont with same HEP but add AAROM for wrist flexion and extention - pain free  10 reps And isometric for thumb midposition - 10 reps in and out / up and down  2 x day  For 3 days , then increase to 2 sets and another 3 days - 3 sets -  If pain free

## 2018-05-13 NOTE — Therapy (Signed)
[facility] [address] [place], [address] Phone: [phone]   Fax:  [phone]  Occupational Therapy Treatment  Patient Details  Name: Brian Kramer MRN: [id number] Date of Birth: 08-20-1945 Referring Provider (OT): Rosita Kea   Encounter Date: 05/13/2018  OT End of Session - 05/13/18 1851    Visit Number  3    Number of Visits  12    Date for OT Re-Evaluation  06/22/18    OT Start Time  0901    OT Stop Time  0942    OT Time Calculation (min)  41 min    Activity Tolerance  Patient tolerated treatment well    Behavior During Therapy  [facility] for tasks assessed/performed       Past Medical History:  Diagnosis Date  . Anginal pain (HCC)   . Arthritis   . Coronary artery disease   . Diabetes mellitus without complication (HCC)    Took Metformin for a couple of years. Had bariatric surgery and no longer requires it. A1-C was 5.5 in October 2018.  Marland Kitchen GERD (gastroesophageal reflux disease)   . Headache   . History of hiatal hernia   . Myocardial infarction (HCC)   . Peripheral neuropathy   . Sleep apnea    No requires the use of CPAP    Past Surgical History:  Procedure Laterality Date  . arthroscpic knee 2005 Right   . Cardiac Stent 1997    . Cardiac Stent 2000    . CARPOMETACARPAL ([facility]) FUSION OF THUMB Right 04/08/2018   Procedure: CARPOMETACARPAL [facility]) FUSION OF THUMB;  Surgeon: Kennedy Bucker, MD;  Location: [facility] ORS;  Service: Orthopedics;  Laterality: Right;  . COLONOSCOPY    . CORONARY ANGIOPLASTY    . EYE SURGERY  2015   Bilateral cataract  . Gastric Sleeve Bariatric Surgery  2017  . HAMMER TOE SURGERY Right 01/15/2018   Procedure: HAMMER TOE CORRECTION-2ND & 3RD;  Surgeon: Recardo Evangelist, DPM;  Location: [facility] ORS;  Service: Podiatry;  Laterality: Right;  . HERNIA REPAIR    . JOINT REPLACEMENT  2007   Left knee  . JOINT REPLACEMENT  2015   Right Knee  . left knee arthroscopy 2006 Left   .  Right Heel Spur Resection 2012    . Right shoulder AC separation surgery in 1995 Right   . Stem Cells to left heel  2017  . TONSILLECTOMY    . UPPER GI ENDOSCOPY  2017  . Vasectomy 1970's    . WEIL OSTEOTOMY Right 01/15/2018   Procedure: WEIL OSTEOTOMY-2ND & 3RD;  Surgeon: Recardo Evangelist, DPM;  Location: [facility] ORS;  Service: Podiatry;  Laterality: Right;    There were no vitals filed for this visit.  Subjective Assessment - 05/13/18 1845    Subjective   I maybe over did my exercises but I stopped then and pain got better- it did not get more than 4/10 - today good - I know now     Patient Stated Goals  Want to pain in my R thumb and hand better to be able to use it more - I love to play on computer, and cook     Currently in Pain?  No/denies         [facility] OT Assessment - 05/13/18 0001      AROM   Right Wrist Extension  53 Degrees    Right Wrist Flexion  73 Degrees      Right Hand  AROM   R Thumb MCP 0-60  47 Degrees    R Thumb IP 0-80  60 Degrees    R Thumb Radial ABduction/ADduction 0-55  46    R Thumb Palmar ABduction/ADduction 0-45  55    R Index  MCP 0-90  90 Degrees       Measure AROM for thumb and wrist- progressing well   Reinforce again - to not over do - surgery was for pain control           OT Treatments/Exercises (OP) - 05/13/18 0001      RUE Fluidotherapy   Number Minutes Fluidotherapy  10 Minutes    RUE Fluidotherapy Location  Hand;Wrist    Comments  AROM for thumb and wrist in all planes      Soft tissue mobs to webspace and Carpal /MC spreads - prior to ROM    AROM thumb PA, RA  with focus on AROM out of CMC  ( can try gently to flatten palm - but pull less than 1/10 )    , thumb MP flexion block  And to cont with IP flexion block   10 reps Opposition pick up 1 cm object - alternate digits 5 reps  and then to  tips of digits  - if there is a bend in MP of thumb   Wrist AROM for flexion, ext, RD, UD - 10 reps  2-3 x day   add this date  AAROM for wrist flexion and extention pain free  and isometric strengthening for thumb in neutral at 45 degrees - resistance up and down  And in and out  10 reps Can increase to 2nd set in 3 days and 6 days 3 sets   each pain free   And use ice as needed   still sleep with splint and with  act that cause pain or resistance to thumb        OT Education - 05/13/18 1851    Education Details  progress and update on HEP     Person(s) Educated  Patient    Methods  Explanation;Demonstration;Handout    Comprehension  Verbalized understanding;Returned demonstration       OT Short Term Goals - 04/27/18 1229      OT SHORT TERM GOAL #1   Title  Pt to be ind in HEP to decrease scar tissue, edema and pain and increase AROM to use hand in 50% or more of ADL's     Baseline  in thumb spica and no knowledge of HEP     Time  4    Period  Weeks    Status  New    Target Date  05/25/18      OT SHORT TERM GOAL #2   Title  Pain on PRWHE improve with more than 20 points     Baseline  Pain on PRWHE at eval 36/50 - cannot lift or use hand repetitive yet     Time  6    Period  Weeks    Status  New    Target Date  06/08/18      OT SHORT TERM GOAL #3   Title  R hand digits flexion improve for pt to touch palm without increase syptoms     Baseline  Flexion of 2nd 75 and 3rd 80 with increase pain - cannot touch palm     Time  4    Period  Weeks    Status  New  Target Date  05/25/18        OT Long Term Goals - 04/27/18 1232      OT LONG TERM GOAL #1   Title  R thumb AROM improve in PA and RA and flexion to grap empy cup and retrieve 1 inch object out of palm     Baseline  NT yet- 2 1/1 wks s/p    Time  6    Period  Weeks    Status  New    Target Date  05/25/18      OT LONG TERM GOAL #2   Title  R wrist AROM improve for pt to use R hand in more than 50%  bath L side of body, apply deodorant and dry with towel    Baseline  Wrist NT - 2 1/2 wks s/p    Time  4    Period  Weeks     Status  New    Target Date  05/25/18      OT LONG TERM GOAL #3   Title  Grip strength and prehension in R hand improve to more than 40% of L hand to do buttons, cut food, carry more than 5 lbs without increase symptoms     Baseline  NT 2 1/2 wks s/p     Time  8    Period  Weeks    Status  New    Target Date  06/22/18      OT LONG TERM GOAL #4   Title  Function on PRWHE improve with more than 20 points     Baseline  function score on PRWHE 45/50    Time  8    Period  Weeks    Status  New    Target Date  06/22/18              Patient will benefit from skilled therapeutic intervention in order to improve the following deficits and impairments:     Visit Diagnosis: Stiffness of right hand, not elsewhere classified  Pain in right finger(s)  Stiffness of right wrist, not elsewhere classified  Scar condition and fibrosis of skin  Muscle weakness (generalized)    Problem List There are no active problems to display for this patient.   Oletta CohnuPreez, Brealyn Baril OTR/L,CLT 05/13/2018, 6:53 PM  [place] [facility] [address] [place], [place], [address] Phone: [phone]   Fax:  [phone]  Name: Brian Kramer MRN: [id number] Date of Birth: Jun 27, 1945

## 2018-05-18 ENCOUNTER — Encounter: Payer: Self-pay | Admitting: *Deleted

## 2018-05-19 ENCOUNTER — Ambulatory Visit: Payer: Medicare Other | Admitting: Certified Registered"

## 2018-05-19 ENCOUNTER — Ambulatory Visit
Admission: RE | Admit: 2018-05-19 | Discharge: 2018-05-19 | Disposition: A | Discharge status: Home / Self Care | Payer: Medicare Other | Source: Ambulatory Visit | Attending: Internal Medicine | Admitting: Internal Medicine

## 2018-05-19 ENCOUNTER — Encounter
Admission: RE | Disposition: A | Discharge status: Home / Self Care | Payer: Self-pay | Source: Ambulatory Visit | Attending: Internal Medicine

## 2018-05-19 DIAGNOSIS — Z86010 Personal history of colonic polyps: Secondary | ICD-10-CM | POA: Insufficient documentation

## 2018-05-19 DIAGNOSIS — Z79899 Other long term (current) drug therapy: Secondary | ICD-10-CM | POA: Diagnosis not present

## 2018-05-19 DIAGNOSIS — I25119 Atherosclerotic heart disease of native coronary artery with unspecified angina pectoris: Secondary | ICD-10-CM | POA: Insufficient documentation

## 2018-05-19 DIAGNOSIS — K640 First degree hemorrhoids: Secondary | ICD-10-CM | POA: Insufficient documentation

## 2018-05-19 DIAGNOSIS — I252 Old myocardial infarction: Secondary | ICD-10-CM | POA: Diagnosis not present

## 2018-05-19 DIAGNOSIS — K5730 Diverticulosis of large intestine without perforation or abscess without bleeding: Secondary | ICD-10-CM | POA: Insufficient documentation

## 2018-05-19 DIAGNOSIS — G2581 Restless legs syndrome: Secondary | ICD-10-CM | POA: Diagnosis not present

## 2018-05-19 DIAGNOSIS — Z880 Allergy status to penicillin: Secondary | ICD-10-CM | POA: Diagnosis not present

## 2018-05-19 DIAGNOSIS — Z955 Presence of coronary angioplasty implant and graft: Secondary | ICD-10-CM | POA: Diagnosis not present

## 2018-05-19 DIAGNOSIS — E1140 Type 2 diabetes mellitus with diabetic neuropathy, unspecified: Secondary | ICD-10-CM | POA: Insufficient documentation

## 2018-05-19 DIAGNOSIS — Z9884 Bariatric surgery status: Secondary | ICD-10-CM | POA: Insufficient documentation

## 2018-05-19 DIAGNOSIS — G709 Myoneural disorder, unspecified: Secondary | ICD-10-CM | POA: Diagnosis not present

## 2018-05-19 DIAGNOSIS — Z1211 Encounter for screening for malignant neoplasm of colon: Secondary | ICD-10-CM | POA: Diagnosis present

## 2018-05-19 DIAGNOSIS — Z91013 Allergy to seafood: Secondary | ICD-10-CM | POA: Insufficient documentation

## 2018-05-19 DIAGNOSIS — Z7982 Long term (current) use of aspirin: Secondary | ICD-10-CM | POA: Insufficient documentation

## 2018-05-19 DIAGNOSIS — Z87891 Personal history of nicotine dependence: Secondary | ICD-10-CM | POA: Insufficient documentation

## 2018-05-19 HISTORY — DX: Peptic ulcer, site unspecified, unspecified as acute or chronic, without hemorrhage or perforation: K27.9

## 2018-05-19 HISTORY — PX: COLONOSCOPY WITH PROPOFOL: SHX5780

## 2018-05-19 HISTORY — DX: Restless legs syndrome: G25.81

## 2018-05-19 SURGERY — COLONOSCOPY WITH PROPOFOL
Anesthesia: General

## 2018-05-19 MED ORDER — PROPOFOL 500 MG/50ML IV EMUL
INTRAVENOUS | Status: DC | PRN
  Administered 2018-05-19: 80 ug/kg/min via INTRAVENOUS

## 2018-05-19 MED ORDER — EPHEDRINE SULFATE 50 MG/ML IJ SOLN
INTRAMUSCULAR | Status: AC
  Filled 2018-05-19: qty 1

## 2018-05-19 MED ORDER — LIDOCAINE HCL (PF) 1 % IJ SOLN
2.0000 mL | Freq: Once | INTRAMUSCULAR | Status: AC
  Administered 2018-05-19: 0.3 mL via INTRADERMAL

## 2018-05-19 MED ORDER — SODIUM CHLORIDE 0.9 % IV SOLN
INTRAVENOUS | Status: DC
  Administered 2018-05-19: 1000 mL via INTRAVENOUS

## 2018-05-19 MED ORDER — EPHEDRINE SULFATE 50 MG/ML IJ SOLN
INTRAMUSCULAR | Status: DC | PRN
  Administered 2018-05-19: 10 mg via INTRAVENOUS

## 2018-05-19 MED ORDER — LIDOCAINE HCL (PF) 1 % IJ SOLN
INTRAMUSCULAR | Status: AC
  Administered 2018-05-19: 0.3 mL via INTRADERMAL
  Filled 2018-05-19: qty 2

## 2018-05-19 MED ORDER — PROPOFOL 500 MG/50ML IV EMUL
INTRAVENOUS | Status: AC
  Filled 2018-05-19: qty 50

## 2018-05-19 MED ORDER — LIDOCAINE HCL (CARDIAC) PF 100 MG/5ML IV SOSY
PREFILLED_SYRINGE | INTRAVENOUS | Status: DC | PRN
  Administered 2018-05-19: 50 mg via INTRATRACHEAL

## 2018-05-19 MED ORDER — PHENYLEPHRINE HCL 10 MG/ML IJ SOLN
INTRAMUSCULAR | Status: DC | PRN
  Administered 2018-05-19 (×2): 100 ug via INTRAVENOUS

## 2018-05-19 MED ORDER — PROPOFOL 10 MG/ML IV BOLUS
INTRAVENOUS | Status: DC | PRN
  Administered 2018-05-19: 30 mg via INTRAVENOUS
  Administered 2018-05-19: 50 mg via INTRAVENOUS

## 2018-05-19 MED ORDER — LIDOCAINE HCL (PF) 2 % IJ SOLN
INTRAMUSCULAR | Status: AC
  Filled 2018-05-19: qty 10

## 2018-05-19 NOTE — Op Note (Signed)
[facility]astroenterology Patient Name: Brian Kramer Procedure Date: 05/19/2018 12:13 PM MRN: [id number] Account #: [id number] Date of Birth: 1946-01-29 Admit Type: Outpatient Age: 72 Room: [facility] ENDO ROOM 3 Gender: Male Note Status: Finalized Procedure:            Colonoscopy Indications:          High risk colon cancer surveillance: Personal history                        of colonic polyps Providers:            Boykin Nearing. Norma Fredrickson MD, MD Referring MD:         Marisue Ivan (Referring MD) Medicines:            Propofol per Anesthesia Complications:        No immediate complications. Procedure:            Pre-Anesthesia Assessment:                       - The risks and benefits of the procedure and the                        sedation options and risks were discussed with the                        patient. All questions were answered and informed                        consent was obtained.                       - Patient identification and proposed procedure were                        verified prior to the procedure by the nurse. The                        procedure was verified in the procedure room.                       - ASA Grade Assessment: III - A patient with severe                        systemic disease.                       - After reviewing the risks and benefits, the patient                        was deemed in satisfactory condition to undergo the                        procedure.                       After obtaining informed consent, the colonoscope was                        passed under direct vision. Throughout the procedure,  the patient's blood pressure, pulse, and oxygen                        saturations were monitored continuously. The                        Colonoscope was introduced through the anus and                        advanced to the the cecum, identified by appendiceal   orifice and ileocecal valve. The colonoscopy was                        performed without difficulty. The patient tolerated the                        procedure well. The quality of the bowel preparation                        was adequate. The ileocecal valve, appendiceal orifice,                        and rectum were photographed. Findings:      The perianal and digital rectal examinations were normal. Pertinent       negatives include normal sphincter tone and no palpable rectal lesions.      A few small-mouthed diverticula were found in the sigmoid colon.      Non-bleeding internal hemorrhoids were found during retroflexion. The       hemorrhoids were Grade I (internal hemorrhoids that do not prolapse).      The exam was otherwise without abnormality. Impression:           - Diverticulosis in the sigmoid colon.                       - Non-bleeding internal hemorrhoids.                       - The examination was otherwise normal.                       - No specimens collected. Recommendation:       - Patient has a contact number available for                        emergencies. The signs and symptoms of potential                        delayed complications were discussed with the patient.                        Return to normal activities tomorrow. Written discharge                        instructions were provided to the patient.                       - Resume previous diet.                       - Continue present medications.                       -  No repeat colonoscopy due to current age (64 years or                        older) and the absence of advanced adenomas.                       - Return to GI office PRN.                       - The findings and recommendations were discussed with                        the patient and their spouse. Procedure Code(s):    --- Professional ---                       Z6109, Colorectal cancer screening; colonoscopy on                         individual at high risk Diagnosis Code(s):    --- Professional ---                       K57.30, Diverticulosis of large intestine without                        perforation or abscess without bleeding                       K64.0, First degree hemorrhoids                       Z86.010, Personal history of colonic polyps CPT copyright 2018 [employer]. All rights reserved. The codes documented in this report are preliminary and upon coder review may  be revised to meet current compliance requirements. Stanton Kidney MD, MD 05/19/2018 12:41:50 PM This report has been signed electronically. Number of Addenda: 0 Note Initiated On: 05/19/2018 12:13 PM Scope Withdrawal Time: 0 hours 9 minutes 57 seconds  Total Procedure Duration: 0 hours 14 minutes 58 seconds       [facility]

## 2018-05-19 NOTE — Anesthesia Preprocedure Evaluation (Addendum)
Anesthesia Evaluation  Patient identified by MRN, date of birth, ID band Patient awake    Reviewed: Allergy & Precautions, H&P , NPO status , Patient's Chart, lab work & pertinent test results  Airway Mallampati: II  TM Distance: >3 FB     Dental  (+) Edentulous Lower, Edentulous Upper   Pulmonary sleep apnea , former smoker,           Cardiovascular + angina (stable.  Pt uses NTG as needed, has not required for months) with exertion + CAD, + Past MI and + Cardiac Stents (placed in 2000)       Neuro/Psych  Headaches,  Neuromuscular disease (peripheral neuropathy) negative psych ROS   GI/Hepatic Neg liver ROS, hiatal hernia, PUD, GERD  Controlled,  Endo/Other  diabetes  Renal/GU negative Renal ROS  negative genitourinary   Musculoskeletal  (+) Arthritis ,   Abdominal   Peds  Hematology negative hematology ROS (+)   Anesthesia Other Findings Past Medical History: No date: Anginal pain (HCC) No date: Arthritis No date: Coronary artery disease No date: Diabetes mellitus without complication (HCC)     Comment:  Took Metformin for a couple of years. Had bariatric               surgery and no longer requires it. A1-C was 5.5 in               October 2018. No date: GERD (gastroesophageal reflux disease) No date: Headache No date: History of hiatal hernia No date: Myocardial infarction (HCC) No date: Peripheral neuropathy No date: PUD (peptic ulcer disease) No date: RLS (restless legs syndrome) No date: Sleep apnea     Comment:  No requires the use of CPAP  Past Surgical History: No date: arthroscpic knee 2005; Right No date: Cardiac Stent 1997 No date: Cardiac Stent 2000 04/08/2018: CARPOMETACARPAL ([facility]) FUSION OF THUMB; Right     Comment:  Procedure: CARPOMETACARPAL [facility]) FUSION OF THUMB;                Surgeon: Kennedy BuckerMenz, Michael, MD;  Location: [facility] ORS;                Service: Orthopedics;  Laterality: Right; No  date: COLONOSCOPY No date: CORONARY ANGIOPLASTY 2015: EYE SURGERY     Comment:  Bilateral cataract 2017: Gastric Sleeve Bariatric Surgery 01/15/2018: HAMMER TOE SURGERY; Right     Comment:  Procedure: HAMMER TOE CORRECTION-2ND & 3RD;  Surgeon:               Recardo Evangelistroxler, Matthew, DPM;  Location: [facility] ORS;  Service:               Podiatry;  Laterality: Right; No date: HERNIA REPAIR 2007: JOINT REPLACEMENT     Comment:  Left knee 2015: JOINT REPLACEMENT     Comment:  Right Knee No date: left knee arthroscopy 2006; Left No date: Right Heel Spur Resection 2012 No date: Right shoulder AC separation surgery in 1995; Right 2017: Stem Cells to left heel No date: TONSILLECTOMY 2017: UPPER GI ENDOSCOPY No date: Vasectomy 1970's 01/15/2018: WEIL OSTEOTOMY; Right     Comment:  Procedure: WEIL OSTEOTOMY-2ND & 3RD;  Surgeon: Recardo Evangelistroxler,               Matthew, DPM;  Location: [facility] ORS;  Service: Podiatry;                Laterality: Right;  BMI    Body Mass Index:  42.04 kg/m  Reproductive/Obstetrics negative OB ROS                            Anesthesia Physical Anesthesia Plan  ASA: III  Anesthesia Plan: General   Post-op Pain Management:    Induction:   PONV Risk Score and Plan: Propofol infusion and TIVA  Airway Management Planned: Natural Airway and Nasal Cannula  Additional Equipment:   Intra-op Plan:   Post-operative Plan:   Informed Consent: I have reviewed the patients History and Physical, chart, labs and discussed the procedure including the risks, benefits and alternatives for the proposed anesthesia with the patient or authorized representative who has indicated his/her understanding and acceptance.   Dental Advisory Given  Plan Discussed with: Anesthesiologist, CRNA and Surgeon  Anesthesia Plan Comments:         Anesthesia Quick Evaluation

## 2018-05-19 NOTE — Transfer of Care (Signed)
Immediate Anesthesia Transfer of Care Note  Patient: Brian Kramer  Procedure(s) Performed: COLONOSCOPY WITH PROPOFOL (N/A )  Patient Location: Endoscopy Unit  Anesthesia Type:General  Level of Consciousness: awake and patient cooperative  Airway & Oxygen Therapy: Patient Spontanous Breathing and Patient connected to face mask oxygen  Post-op Assessment: Report given to RN and Post -op Vital signs reviewed and stable  Post vital signs: stable  Last Vitals:  Vitals Value Taken Time  BP 112/65 05/19/2018 12:49 PM  Temp 36.1 C 05/19/2018 12:46 PM  Pulse 68 05/19/2018 12:51 PM  Resp 15 05/19/2018 12:51 PM  SpO2 100 % 05/19/2018 12:51 PM  Vitals shown include unvalidated device data.  Last Pain:  Vitals:   05/19/18 1246  TempSrc: Tympanic  PainSc:          Complications: No apparent anesthesia complications

## 2018-05-19 NOTE — H&P (Signed)
Outpatient short stay form Pre-procedure 05/19/2018 11:01 AM  K. Norma Fredricksonoledo, M.D.  Primary Physician: Elpidio EricKanka Linthavong, M.D.  Reason for visit:  Personal hx colon polyps  History of present illness:                            Patient presents for colonoscopy for a personal hx of colon polyps. The patient denies abdominal pain, abnormal weight loss or rectal bleeding.    No current facility-administered medications for this encounter.   Medications Prior to Admission  Medication Sig Dispense Refill Last Dose  . aspirin EC 81 MG tablet Take 81 mg by mouth every evening.    04/06/2018  . Calcium Carb-Cholecalciferol (CALCIUM + VITAMIN D3) 500-400 MG-UNIT CHEW Chew 1 each by mouth 2 (two) times daily.   04/07/2018 at Unknown time  . HYDROcodone-acetaminophen (NORCO) 5-325 MG tablet Take 1 tablet by mouth every 6 (six) hours as needed. 20 tablet 0   . lisinopril (PRINIVIL,ZESTRIL) 5 MG tablet Take 5 mg by mouth daily.    04/07/2018 at Unknown time  . LYRICA 200 MG capsule Take 200 mg by mouth 3 (three) times daily.    04/07/2018 at Unknown time  . Multiple Vitamins-Minerals (BARIATRIC MULTIVITAMINS/IRON PO) Take 1 tablet by mouth daily.   04/07/2018 at Unknown time  . nitroGLYCERIN (NITROSTAT) 0.4 MG SL tablet Place 0.4 mg under the tongue every 5 (five) minutes as needed for chest pain.    Not Taking at Unknown time  . rOPINIRole (REQUIP XL) 4 MG 24 hr tablet Take 4 mg by mouth at bedtime.   04/07/2018 at Unknown time  . rosuvastatin (CRESTOR) 5 MG tablet Take 5 mg by mouth every other day. At night   04/07/2018 at Unknown time  . traZODone (DESYREL) 50 MG tablet Take 50 mg by mouth at bedtime as needed for sleep.    Past Month at Unknown time     Allergies  Allergen Reactions  . Tape Other (See Comments)    Round, white EKG pads only - "ate holes in my skin" 1997 when stays on skin for longer period   . Penicillins Itching, Rash and Other (See Comments)    Has patient had a PCN  reaction causing immediate rash, facial/tongue/throat swelling, SOB or lightheadedness with hypotension: Yes Has patient had a PCN reaction causing severe rash involving mucus membranes or skin necrosis: No Has patient had a PCN reaction that required hospitalization: No Has patient had a PCN reaction occurring within the last 10 years: No If all of the above answers are "NO", then may proceed with Cephalosporin use.   . Shellfish Allergy Rash and Other (See Comments)    PT CAN NOT EAT CRAB BUT CAN EAT SHELLFISH.      Past Medical History:  Diagnosis Date  . Anginal pain (HCC)   . Arthritis   . Coronary artery disease   . Diabetes mellitus without complication (HCC)    Took Metformin for a couple of years. Had bariatric surgery and no longer requires it. A1-C was 5.5 in October 2018.  Marland Kitchen. GERD (gastroesophageal reflux disease)   . Headache   . History of hiatal hernia   . Myocardial infarction (HCC)   . Peripheral neuropathy   . PUD (peptic ulcer disease)   . RLS (restless legs syndrome)   . Sleep apnea    No requires the use of CPAP    Review of systems:  Otherwise negative.  Physical Exam  Gen: Alert, oriented. Appears stated age.  HEENT: [place]/AT. PERRLA. Lungs: CTA, no wheezes. CV: RR nl S1, S2. Abd: soft, benign, no masses. BS+ Ext: No edema. Pulses 2+    Planned procedures: Proceed with colonoscopy. The patient understands the nature of the planned procedure, indications, risks, alternatives and potential complications including but not limited to bleeding, infection, perforation, damage to internal organs and possible oversedation/side effects from anesthesia. The patient agrees and gives consent to proceed.  Please refer to procedure notes for findings, recommendations and patient disposition/instructions.      K. Norma Fredrickson, M.D. Gastroenterology 05/19/2018  11:01 AM

## 2018-05-19 NOTE — Anesthesia Post-op Follow-up Note (Signed)
Anesthesia QCDR form completed.        

## 2018-05-19 NOTE — Interval H&P Note (Signed)
History and Physical Interval Note:  05/19/2018 11:02 AM  Brian Kramer  has presented today for surgery, with the diagnosis of Personal history colon polyps  The various methods of treatment have been discussed with the patient and family. After consideration of risks, benefits and other options for treatment, the patient has consented to  Procedure(s): COLONOSCOPY WITH PROPOFOL (N/A) as a surgical intervention .  The patient's history has been reviewed, patient examined, no change in status, stable for surgery.  I have reviewed the patient's chart and labs.  Questions were answered to the patient's satisfaction.     Little Sturgeonoledo, Schulte[place]oro

## 2018-05-20 ENCOUNTER — Ambulatory Visit: Payer: Medicare Other | Admitting: Occupational Therapy

## 2018-05-20 ENCOUNTER — Encounter: Payer: Self-pay | Admitting: Internal Medicine

## 2018-05-20 DIAGNOSIS — M25641 Stiffness of right hand, not elsewhere classified: Secondary | ICD-10-CM | POA: Diagnosis not present

## 2018-05-20 DIAGNOSIS — M79644 Pain in right finger(s): Secondary | ICD-10-CM

## 2018-05-20 DIAGNOSIS — M6281 Muscle weakness (generalized): Secondary | ICD-10-CM

## 2018-05-20 DIAGNOSIS — M25631 Stiffness of right wrist, not elsewhere classified: Secondary | ICD-10-CM

## 2018-05-20 DIAGNOSIS — L905 Scar conditions and fibrosis of skin: Secondary | ICD-10-CM

## 2018-05-20 NOTE — Patient Instructions (Signed)
Add this date rubber band for PA and RA of thumb  Putty - teal for gripping , lat and 3 point grip  But long snake for lat and 3 point  And only 12 reps  and pain free  can increase to 2 sets 2 xday in 3 days and in 6 days 3 sets - if pain free  2 lbs for wrist in all planes- did not had pain  12 reps  2 x day  Also can increase sets like above

## 2018-05-20 NOTE — Anesthesia Postprocedure Evaluation (Signed)
Anesthesia Post Note  Patient: Brian Kramer  Procedure(s) Performed: COLONOSCOPY WITH PROPOFOL (N/A )  Patient location during evaluation: PACU Anesthesia Type: General Level of consciousness: awake and alert Pain management: pain level controlled Vital Signs Assessment: post-procedure vital signs reviewed and stable Respiratory status: spontaneous breathing, nonlabored ventilation, respiratory function stable and patient connected to nasal cannula oxygen Cardiovascular status: blood pressure returned to baseline and stable Postop Assessment: no apparent nausea or vomiting Anesthetic complications: no     Last Vitals:  Vitals:   05/19/18 1256 05/19/18 1305  BP: 112/62 114/65  Pulse: 69 71  Resp: 15 20  Temp:    SpO2: 99% 96%    Last Pain:  Vitals:   05/20/18 0731  TempSrc:   PainSc: 0-No pain                 [name]

## 2018-05-20 NOTE — Therapy (Signed)
[facility] [address], [place], [address] Phone: [phone]   Fax:  [phone]  Occupational Therapy Treatment  Patient Details  Name: Brian Kramer MRN: [id number] Date of Birth: 17-Aug-1945 Referring Provider (OT): Rosita Kea   Encounter Date: 05/20/2018  OT End of Session - 05/20/18 1034    Visit Number  4    Number of Visits  12    Date for OT Re-Evaluation  06/22/18    OT Start Time  0926    OT Stop Time  1011    OT Time Calculation (min)  45 min    Activity Tolerance  Patient tolerated treatment well    Behavior During Therapy  [facility] for tasks assessed/performed       Past Medical History:  Diagnosis Date  . Anginal pain (HCC)   . Arthritis   . Coronary artery disease   . Diabetes mellitus without complication (HCC)    Took Metformin for a couple of years. Had bariatric surgery and no longer requires it. A1-C was 5.5 in October 2018.  Marland Kitchen GERD (gastroesophageal reflux disease)   . Headache   . History of hiatal hernia   . Myocardial infarction (HCC)   . Peripheral neuropathy   . PUD (peptic ulcer disease)   . RLS (restless legs syndrome)   . Sleep apnea    No requires the use of CPAP    Past Surgical History:  Procedure Laterality Date  . arthroscpic knee 2005 Right   . Cardiac Stent 1997    . Cardiac Stent 2000    . CARPOMETACARPAL ([facility]) FUSION OF THUMB Right 04/08/2018   Procedure: CARPOMETACARPAL [facility]) FUSION OF THUMB;  Surgeon: Kennedy Bucker, MD;  Location: [facility] ORS;  Service: Orthopedics;  Laterality: Right;  . COLONOSCOPY    . COLONOSCOPY WITH PROPOFOL N/A 05/19/2018   Procedure: COLONOSCOPY WITH PROPOFOL;  Surgeon: Toledo, Boykin Nearing, MD;  Location: [facility] ENDOSCOPY;  Service: Gastroenterology;  Laterality: N/A;  . CORONARY ANGIOPLASTY    . EYE SURGERY  2015   Bilateral cataract  . Gastric Sleeve Bariatric Surgery  2017  . HAMMER TOE SURGERY Right 01/15/2018   Procedure: HAMMER TOE  CORRECTION-2ND & 3RD;  Surgeon: Recardo Evangelist, DPM;  Location: [facility] ORS;  Service: Podiatry;  Laterality: Right;  . HERNIA REPAIR    . JOINT REPLACEMENT  2007   Left knee  . JOINT REPLACEMENT  2015   Right Knee  . left knee arthroscopy 2006 Left   . Right Heel Spur Resection 2012    . Right shoulder AC separation surgery in 1995 Right   . Stem Cells to left heel  2017  . TONSILLECTOMY    . UPPER GI ENDOSCOPY  2017  . Vasectomy 1970's    . WEIL OSTEOTOMY Right 01/15/2018   Procedure: WEIL OSTEOTOMY-2ND & 3RD;  Surgeon: Recardo Evangelist, DPM;  Location: [facility] ORS;  Service: Podiatry;  Laterality: Right;    There were no vitals filed for this visit.  Subjective Assessment - 05/20/18 1030    Subjective   Doing okay- pain is good- I sometimes want to over do it - so then I need to hold back little -     Patient Stated Goals  Want to pain in my R thumb and hand better to be able to use it more - I love to play on computer, and cook     Currently in Pain?  No/denies  [facility] OT Assessment - 05/20/18 0001      AROM   Right Wrist Extension  60 Degrees    Right Wrist Flexion  75 Degrees      Strength   Right Hand Grip (lbs)  40    Right Hand Lateral Pinch  14 lbs    Right Hand 3 Point Pinch  14 lbs    Left Hand Grip (lbs)  82    Left Hand Lateral Pinch  10 lbs    Left Hand 3 Point Pinch  8 lbs        pt doing well with strength testing for PA and RA of thumb - and no pain  As well as wrist in all planes        OT Treatments/Exercises (OP) - 05/20/18 0001      RUE Paraffin   Number Minutes Paraffin  8 Minutes    RUE Paraffin Location  Hand   wrist   Comments  prior to ROM and scar mobs      scar massage done on radial wrist scar with vibration and xtractor for adhesion -  Pt to cont with scar massage     rubber band for PA and RA of thumb 10 reps  pain free  Putty - teal for gripping , lat and 3 point grip  But do long snake for lat and 3 point - had no  pain when longer putty with less resistance  10-  12 reps  and pain free  can increase to 2 sets 2 xday in 3 days and in 6 days 3 sets - if pain free  2 lbs for wrist in all planes- did not had pain  12 reps  2 x day  Also can increase sets like above       OT Education - 05/20/18 1034    Education Details  progress and update on HEP     Person(s) Educated  Patient    Methods  Explanation;Demonstration;Handout    Comprehension  Verbalized understanding;Returned demonstration       OT Short Term Goals - 04/27/18 1229      OT SHORT TERM GOAL #1   Title  Pt to be ind in HEP to decrease scar tissue, edema and pain and increase AROM to use hand in 50% or more of ADL's     Baseline  in thumb spica and no knowledge of HEP     Time  4    Period  Weeks    Status  New    Target Date  05/25/18      OT SHORT TERM GOAL #2   Title  Pain on PRWHE improve with more than 20 points     Baseline  Pain on PRWHE at eval 36/50 - cannot lift or use hand repetitive yet     Time  6    Period  Weeks    Status  New    Target Date  06/08/18      OT SHORT TERM GOAL #3   Title  R hand digits flexion improve for pt to touch palm without increase syptoms     Baseline  Flexion of 2nd 75 and 3rd 80 with increase pain - cannot touch palm     Time  4    Period  Weeks    Status  New    Target Date  05/25/18        OT Long Term Goals - 04/27/18 1232  OT LONG TERM GOAL #1   Title  R thumb AROM improve in PA and RA and flexion to grap empy cup and retrieve 1 inch object out of palm     Baseline  NT yet- 2 1/1 wks s/p    Time  6    Period  Weeks    Status  New    Target Date  05/25/18      OT LONG TERM GOAL #2   Title  R wrist AROM improve for pt to use R hand in more than 50%  bath L side of body, apply deodorant and dry with towel    Baseline  Wrist NT - 2 1/2 wks s/p    Time  4    Period  Weeks    Status  New    Target Date  05/25/18      OT LONG TERM GOAL #3   Title  Grip  strength and prehension in R hand improve to more than 40% of L hand to do buttons, cut food, carry more than 5 lbs without increase symptoms     Baseline  NT 2 1/2 wks s/p     Time  8    Period  Weeks    Status  New    Target Date  06/22/18      OT LONG TERM GOAL #4   Title  Function on PRWHE improve with more than 20 points     Baseline  function score on PRWHE 45/50    Time  8    Period  Weeks    Status  New    Target Date  06/22/18            Plan - 05/20/18 1035    Clinical Impression Statement  Pt present at OT 6 wks s/p R thumb CMC arthroplasty- pt report no pain except if he over do things - pt done good with isometric - add this date 2 lbs for wrist in all planes and rubber band for PA and RA - teal putty for grip and prehension strength- reinforce for pt to not over do putty - and pain should be less than 2/10     Occupational performance deficits (Please refer to evaluation for details):  ADL's;IADL's;Play;Leisure;Social Participation    Rehab Potential  Good    OT Frequency  1x / week    OT Duration  4 weeks    OT Treatment/Interventions  Self-care/ADL training;Therapeutic exercise;Patient/family education;Splinting;Paraffin;Fluidtherapy;Contrast Bath;Manual Therapy;Passive range of motion;Functional Mobility Training;Scar mobilization    Plan  assess progress and tolerance to upgrade with putty and 2 lbs weight     Clinical Decision Making  Multiple treatment options, significant modification of task necessary    OT Home Exercise Plan  see pt instruction    Consulted and Agree with Plan of Care  Patient       Patient will benefit from skilled therapeutic intervention in order to improve the following deficits and impairments:  Pain, Impaired flexibility, Increased edema, Decreased scar mobility, Decreased coordination, Decreased range of motion, Decreased strength, Impaired UE functional use, Decreased knowledge of precautions  Visit Diagnosis: Stiffness of right  hand, not elsewhere classified  Pain in right finger(s)  Stiffness of right wrist, not elsewhere classified  Scar condition and fibrosis of skin  Muscle weakness (generalized)    Problem List There are no active problems to display for this patient.   Oletta Cohn OTR/L,CLT 05/20/2018, 1:55 PM  [facility] [address] [place], [place], [address] Phone: [phone]   Fax:  [phone]  Name: Learta CoddingClinton Kramer MRN: [id number] Date of Birth: Oct 12, 1945

## 2018-05-27 ENCOUNTER — Encounter: Payer: Medicare Other | Admitting: Occupational Therapy

## 2018-06-03 ENCOUNTER — Ambulatory Visit: Payer: Medicare Other | Admitting: Occupational Therapy

## 2018-06-03 DIAGNOSIS — M6281 Muscle weakness (generalized): Secondary | ICD-10-CM

## 2018-06-03 DIAGNOSIS — M25641 Stiffness of right hand, not elsewhere classified: Secondary | ICD-10-CM

## 2018-06-03 DIAGNOSIS — M79644 Pain in right finger(s): Secondary | ICD-10-CM

## 2018-06-03 DIAGNOSIS — M25631 Stiffness of right wrist, not elsewhere classified: Secondary | ICD-10-CM

## 2018-06-03 DIAGNOSIS — L905 Scar conditions and fibrosis of skin: Secondary | ICD-10-CM

## 2018-06-03 NOTE — Patient Instructions (Signed)
Pt to focus on quality and not quantity  Decrease reps to 1 x 10   teal putty for grip and prehension  Increase 2 sets in 3 days , 3 sets 6 day if not increase pain  Wrist exercises same Thumb PA and RA - focus on CMC move and not only MP

## 2018-06-03 NOTE — Therapy (Signed)
[place] [facility] [address], [place], [address] Phone: [phone]   Fax:  [phone]  Occupational Therapy Treatment  Patient Details  Name: Brian Kramer MRN: [id number] Date of Birth: [birth year] Referring Provider (OT): Rosita Kea   Encounter Date: 06/03/2018  OT End of Session - 06/03/18 0856    Visit Number  5    Number of Visits  12    Date for OT Re-Evaluation  06/22/18    OT Start Time  0803    OT Stop Time  0839    OT Time Calculation (min)  36 min    Activity Tolerance  Patient tolerated treatment well    Behavior During Therapy  [facility] for tasks assessed/performed       Past Medical History:  Diagnosis Date  . Anginal pain (HCC)   . Arthritis   . Coronary artery disease   . Diabetes mellitus without complication (HCC)    Took Metformin for a couple of years. Had bariatric surgery and no longer requires it. A1-C was 5.5 in October 2018.  Marland Kitchen GERD (gastroesophageal reflux disease)   . Headache   . History of hiatal hernia   . Myocardial infarction (HCC)   . Peripheral neuropathy   . PUD (peptic ulcer disease)   . RLS (restless legs syndrome)   . Sleep apnea    No requires the use of CPAP    Past Surgical History:  Procedure Laterality Date  . arthroscpic knee 2005 Right   . Cardiac Stent 1997    . Cardiac Stent 2000    . CARPOMETACARPAL ([facility]) FUSION OF THUMB Right 04/08/2018   Procedure: CARPOMETACARPAL [facility]) FUSION OF THUMB;  Surgeon: Kennedy Bucker, MD;  Location: [facility] OR[facility];  Service: Orthopedics;  Laterality: Right;  . COLONOSCOPY    . COLONOSCOPY WITH PROPOFOL N/A 05/19/2018   Procedure: COLONOSCOPY WITH PROPOFOL;  Surgeon: Toledo, Boykin Nearing, MD;  Location: [facility];  Service: Gastroenterology;  Laterality: N/A;  . CORONARY ANGIOPLASTY    . EYE SURGERY  2015   Bilateral cataract  . Gastric Sleeve Bariatric Surgery  2017  . HAMMER TOE SURGERY Right 01/15/2018   Procedure: HAMMER TOE  CORRECTION-2ND & 3RD;  Surgeon: Recardo Evangelist, DPM;  Location: [facility];  Service: Podiatry;  Laterality: Right;  . HERNIA REPAIR    . JOINT REPLACEMENT  2007   Left knee  . JOINT REPLACEMENT  2015   Right Knee  . left knee arthroscopy 2006 Left   . Right Heel Spur Resection 2012    . Right shoulder AC separation surgery in 1995 Right   . Stem Cells to left heel  2017  . TONSILLECTOMY    . UPPER GI ENDOSCOPY  2017  . Vasectomy 1970's    . WEIL OSTEOTOMY Right 01/15/2018   Procedure: WEIL OSTEOTOMY-2ND & 3RD;  Surgeon: Recardo Evangelist, DPM;  Location: [facility];  Service: Podiatry;  Laterality: Right;    There were no vitals filed for this visit.  Subjective Assessment - 06/03/18 0852    Subjective   Sorry I could not come last time - was stuck in parking lot with gasleak - I did the exercises until my thumb tired or sore - my thumb has been bothering me down her - playing with ball during day - seen Dr , to wear splint still night time and when out and about     Patient Stated Goals  Want to pain in my R thumb and  hand better to be able to use it more - I love to play on computer, and cook     Currently in Pain?  Yes    Pain Score  1     Pain Location  Hand    Pain Orientation  Right    Pain Descriptors / Indicators  Sore    Pain Type  Surgical pain    Pain Onset  1 to 4 weeks ago    Pain Frequency  Intermittent         [facility] OT Assessment - 06/03/18 0001      Strength   Right Hand Grip (lbs)  46    Right Hand Lateral Pinch  14 lbs    Right Hand 3 Point Pinch  14 lbs    Left Hand Grip (lbs)  76    Left Hand Lateral Pinch  10 lbs    Left Hand 3 Point Pinch  8 lbs               OT Treatments/Exercises (OP) - 06/03/18 0001      RUE Paraffin   Number Minutes Paraffin  10 Minutes    RUE Paraffin Location  Hand    Comments  prior to review of HEP - decrease pain         soft tissue mobs to webspace of thumb - after paraffin    rubber band for PA and RA of  thumb 10 reps  pain free - CMC needs to move - not hyper extend MP   Putty - teal for gripping , lat and 3 point grip - pt said he had light blue - ?? Check next time But do short snake for lat and 3 point - had no pain when longer putty with less resistance  10-  12 reps  and pain free  can increase to 2 sets 2 xday in 3 days and in 6 days 3 sets - if pain free  2 lbs for wrist in all planes- cont with  12 reps  2 x day  Also can increase sets like above And fitted with neoprene soft CMC splint to wear with act that cause pain -for functional strengthening        OT Education - 06/03/18 0855    Education Details  progress and update on HEP - and purpose of surgery - not over do     Person(s) Educated  Patient    Methods  Explanation;Demonstration;Handout    Comprehension  Verbalized understanding;Returned demonstration       OT Short Term Goals - 04/27/18 1229      OT SHORT TERM GOAL #1   Title  Pt to be ind in HEP to decrease scar tissue, edema and pain and increase AROM to use hand in 50% or more of ADL's     Baseline  in thumb spica and no knowledge of HEP     Time  4    Period  Weeks    Status  New    Target Date  05/25/18      OT SHORT TERM GOAL #2   Title  Pain on PRWHE improve with more than 20 points     Baseline  Pain on PRWHE at eval 36/50 - cannot lift or use hand repetitive yet     Time  6    Period  Weeks    Status  New    Target Date  06/08/18      OT  SHORT TERM GOAL #3   Title  R hand digits flexion improve for pt to touch palm without increase syptoms     Baseline  Flexion of 2nd 75 and 3rd 80 with increase pain - cannot touch palm     Time  4    Period  Weeks    Status  New    Target Date  05/25/18        OT Long Term Goals - 04/27/18 1232      OT LONG TERM GOAL #1   Title  R thumb AROM improve in PA and RA and flexion to grap empy cup and retrieve 1 inch object out of palm     Baseline  NT yet- 2 1/1 wks s/p    Time  6    Period   Weeks    Status  New    Target Date  05/25/18      OT LONG TERM GOAL #2   Title  R wrist AROM improve for pt to use R hand in more than 50%  bath L side of body, apply deodorant and dry with towel    Baseline  Wrist NT - 2 1/2 wks s/p    Time  4    Period  Weeks    Status  New    Target Date  05/25/18      OT LONG TERM GOAL #3   Title  Grip strength and prehension in R hand improve to more than 40% of L hand to do buttons, cut food, carry more than 5 lbs without increase symptoms     Baseline  NT 2 1/2 wks s/p     Time  8    Period  Weeks    Status  New    Target Date  06/22/18      OT LONG TERM GOAL #4   Title  Function on PRWHE improve with more than 20 points     Baseline  function score on PRWHE 45/50    Time  8    Period  Weeks    Status  New    Target Date  06/22/18            Plan - 06/03/18 0856    Clinical Impression Statement  Pt is 8 wks s/p CMC R thumb arthroplasty - pt and wife report pt has soreness or some pain the last week or so at Thumb thenar - reinforce with pt not to over do -purpose of surgery is pain control - quality exercises more important than quatity - did up putty resistanct but pt to keep others same and wear soft neopenrene splint iwth act that cause some pain     Occupational performance deficits (Please refer to evaluation for details):  ADL's;IADL's;Play;Leisure;Social Participation    Rehab Potential  Good    OT Frequency  1x / week    OT Duration  4 weeks    OT Treatment/Interventions  Self-care/ADL training;Therapeutic exercise;Patient/family education;Splinting;Paraffin;Fluidtherapy;Contrast Bath;Manual Therapy;Passive range of motion;Functional Mobility Training;Scar mobilization    Plan  assess progress and tolerance to upgrade with putty and 2 lbs weight - and should be pain free    Clinical Decision Making  Multiple treatment options, significant modification of task necessary    OT Home Exercise Plan  see pt instruction     Consulted and Agree with Plan of Care  Patient       Patient will benefit from skilled therapeutic intervention in order to improve  the following deficits and impairments:  Pain, Impaired flexibility, Increased edema, Decreased scar mobility, Decreased coordination, Decreased range of motion, Decreased strength, Impaired UE functional use, Decreased knowledge of precautions  Visit Diagnosis: Stiffness of right hand, not elsewhere classified  Pain in right finger(s)  Stiffness of right wrist, not elsewhere classified  Scar condition and fibrosis of skin  Muscle weakness (generalized)    Problem List There are no active problems to display for this patient.   Oletta CohnuPreez, Chaunce Winkels OTR/L,CLT 06/03/2018, 9:00 AM  [place] [facility] [address] [place], [place], [address] Phone: [phone]   Fax:  [phone]  Name: Brian Kramer MRN: [id number] Date of Birth: May 04, 1946

## 2018-06-11 ENCOUNTER — Ambulatory Visit: Payer: Medicare Other | Attending: Orthopedic Surgery | Admitting: Occupational Therapy

## 2018-06-11 DIAGNOSIS — M6281 Muscle weakness (generalized): Secondary | ICD-10-CM | POA: Diagnosis present

## 2018-06-11 DIAGNOSIS — L905 Scar conditions and fibrosis of skin: Secondary | ICD-10-CM | POA: Insufficient documentation

## 2018-06-11 DIAGNOSIS — M25631 Stiffness of right wrist, not elsewhere classified: Secondary | ICD-10-CM

## 2018-06-11 DIAGNOSIS — M79644 Pain in right finger(s): Secondary | ICD-10-CM

## 2018-06-11 DIAGNOSIS — M25641 Stiffness of right hand, not elsewhere classified: Secondary | ICD-10-CM | POA: Insufficient documentation

## 2018-06-11 NOTE — Patient Instructions (Signed)
Pt to hold off on putty gripping for 4 days - and do contrast to hand and wrist Can cont with ROM , 2 lbs weight for wrist  3 x 10 reps  And putty for lat and 3 point - but make putty teal thinner and do 12 reps  x2 until next appt  and wear cmc neoprene splint correctly

## 2018-06-11 NOTE — Therapy (Signed)
[place] [facility] [address] [place], [place], [address] Phone: [phone]   Fax:  [phone]  Occupational Therapy Treatment  Patient Details  Name: Brian Kramer MRN: [id number] Date of Birth: 05-Aug-1945 Referring Provider (OT): Rosita KeaMenz   Encounter Date: 06/11/2018  OT End of Session - 06/11/18 1226    Visit Number  6    Number of Visits  12    Date for OT Re-Evaluation  06/22/18    OT Start Time  0855    OT Stop Time  0935    OT Time Calculation (min)  40 min    Activity Tolerance  Patient tolerated treatment well    Behavior During Therapy  [facility] for tasks assessed/performed       Past Medical History:  Diagnosis Date  . Anginal pain (HCC)   . Arthritis   . Coronary artery disease   . Diabetes mellitus without complication (HCC)    Took Metformin for a couple of years. Had bariatric surgery and no longer requires it. A1-C was 5.5 in October 2018.  Marland Kitchen. GERD (gastroesophageal reflux disease)   . Headache   . History of hiatal hernia   . Myocardial infarction (HCC)   . Peripheral neuropathy   . PUD (peptic ulcer disease)   . RLS (restless legs syndrome)   . Sleep apnea    No requires the use of CPAP    Past Surgical History:  Procedure Laterality Date  . arthroscpic knee 2005 Right   . Cardiac Stent 1997    . Cardiac Stent 2000    . CARPOMETACARPAL ([facility]) FUSION OF THUMB Right 04/08/2018   Procedure: CARPOMETACARPAL [facility]) FUSION OF THUMB;  Surgeon: Kennedy BuckerMenz, Michael, MD;  Location: [facility];  Service: Orthopedics;  Laterality: Right;  . COLONOSCOPY    . COLONOSCOPY WITH PROPOFOL N/A 05/19/2018   Procedure: COLONOSCOPY WITH PROPOFOL;  Surgeon: Toledo, Boykin Nearingeodoro K, MD;  Location: [facility];  Service: Gastroenterology;  Laterality: N/A;  . CORONARY ANGIOPLASTY    . EYE SURGERY  2015   Bilateral cataract  . Gastric Sleeve Bariatric Surgery  2017  . HAMMER TOE SURGERY Right 01/15/2018   Procedure: HAMMER TOE  CORRECTION-2ND & 3RD;  Surgeon: Recardo Evangelistroxler, Matthew, DPM;  Location: [facility];  Service: Podiatry;  Laterality: Right;  . HERNIA REPAIR    . JOINT REPLACEMENT  2007   Left knee  . JOINT REPLACEMENT  2015   Right Knee  . left knee arthroscopy 2006 Left   . Right Heel Spur Resection 2012    . Right shoulder AC separation surgery in 1995 Right   . Stem Cells to left heel  2017  . TONSILLECTOMY    . UPPER GI ENDOSCOPY  2017  . Vasectomy 1970's    . WEIL OSTEOTOMY Right 01/15/2018   Procedure: WEIL OSTEOTOMY-2ND & 3RD;  Surgeon: Recardo Evangelistroxler, Matthew, DPM;  Location: [facility];  Service: Podiatry;  Laterality: Right;    There were no vitals filed for this visit.  Subjective Assessment - 06/11/18 0904    Subjective   This past week I had more  pain at my wrist on pinkie side of wrist when making fist - and then swelling stay at the thumb  - I did not stop any of my exercises - I maybe do little more than I should     Patient Stated Goals  Want to pain in my R thumb and hand better to be able to use it more -  I love to play on computer, and cook     Currently in Pain?  Yes    Pain Score  5     Pain Location  Wrist    Pain Orientation  Right    Pain Descriptors / Indicators  Sore    Pain Type  Surgical pain    Pain Onset  1 to 4 weeks ago    Pain Frequency  Intermittent         assess pt had pain with making tight fist - or report with his teal putty on ulnar wrist   tender over distal ulnar wrist  But no pain with wrist flexion or flexion of 4th and 5th - resistance           OT Treatments/Exercises (OP) - 06/11/18 0001      RUE Paraffin   Number Minutes Paraffin  10 Minutes    RUE Paraffin Location  Hand   wrist   Comments  done 5 min x 2  with ice inbetween and afterwards       combine with paraffin some 2 rotations of ice  Pain decrease to 0/10     soft tissue mobs to webspace of thumb - after paraffin  rubber band for PA and RA of thumb 10 reps to cont with at home   pain free - CMC needs to move - not hyper extend MP   Putty - teal for gripping - TO HOLD OFF  But can do  lat and 3 point grip - teal but in a short snake - 12 reps   x 2 pain below 1- 2/10   2 lbs for wrist in all planes- cont with  12 reps - did not had any pain  Can do 3 sets -pain free  2 x day   Pt report that  neoprene soft CMC splint fit to tight - upon review again -pt did not fasten correctly - review with pt     OT Education - 06/11/18 1226    Education Details  precautions and modifications - keep pain less than 1-2/10 - not to over do     Person(s) Educated  Patient    Methods  Explanation;Demonstration;Handout    Comprehension  Verbalized understanding;Returned demonstration       OT Short Term Goals - 06/11/18 1230      OT SHORT TERM GOAL #1   Title  Pt to be ind in HEP to decrease scar tissue, edema and pain and increase AROM to use hand in 50% or more of ADL's     Baseline  still wear splint at times - pain still increase - pt overdo HEP     Time  2    Period  Weeks    Status  On-going    Target Date  06/25/18      OT SHORT TERM GOAL #2   Title  Pain on PRWHE improve with more than 20 points     Baseline  Pain on PRWHE at eval 36/50 - still increase this past week when he over done HEP - 5/10 with grip on ulnar wrist     Time  2    Period  Weeks    Status  On-going    Target Date  06/25/18      OT SHORT TERM GOAL #3   Title  R hand digits flexion improve for pt to touch palm without increase syptoms     Status  Achieved  OT Long Term Goals - 06/11/18 1231      OT LONG TERM GOAL #1   Title  R thumb AROM improve in PA and RA and flexion to grap empy cup and retrieve 1 inch object out of palm     Status  Achieved      OT LONG TERM GOAL #2   Title  R wrist AROM improve for pt to use R hand in more than 50%  bath L side of body, apply deodorant and dry with towel    Status  Achieved      OT LONG TERM GOAL #3   Title  Grip strength and  prehension in R hand improve to more than 40% of L hand to do buttons, cut food, carry more than 5 lbs without increase symptoms     Baseline  increase pain with putty and gripping this past week- on ulnar side of wrist     Time  2    Period  Weeks    Status  On-going    Target Date  06/25/18      OT LONG TERM GOAL #4   Title  Function on PRWHE improve with more than 20 points     Baseline  function score on PRWHE 45/50 - improving - will do next time     Time  3    Period  Weeks    Status  On-going    Target Date  07/02/18            Plan - 06/11/18 1227    Clinical Impression Statement  Pt is 9 wks s/p CMC R thumb arthroplasty - pt report increase pain this past week on ulnar wrist with gripping - tight grip or putty - pt over done HEP this past week  - pt to hold off on gripping putty - can do lat and 3 point grip -and cont 2 lbs weight - did not increase pain - with contrast pain decrease - pt to do contrast correctly the net few days     Occupational performance deficits (Please refer to evaluation for details):  ADL's;IADL's;Play;Leisure;Social Participation    Rehab Potential  Good    OT Frequency  1x / week    OT Duration  2 weeks    OT Treatment/Interventions  Self-care/ADL training;Therapeutic exercise;Patient/family education;Splinting;Paraffin;Fluidtherapy;Contrast Bath;Manual Therapy;Passive range of motion;Functional Mobility Training;Scar mobilization    Plan  assess pain at wrist with HEP - progress with HEP     Clinical Decision Making  Multiple treatment options, significant modification of task necessary    OT Home Exercise Plan  see pt instruction    Consulted and Agree with Plan of Care  Patient       Patient will benefit from skilled therapeutic intervention in order to improve the following deficits and impairments:  Pain, Impaired flexibility, Increased edema, Decreased scar mobility, Decreased coordination, Decreased range of motion, Decreased strength,  Impaired UE functional use, Decreased knowledge of precautions  Visit Diagnosis: Stiffness of right hand, not elsewhere classified  Pain in right finger(s)  Stiffness of right wrist, not elsewhere classified  Scar condition and fibrosis of skin  Muscle weakness (generalized)    Problem List There are no active problems to display for this patient.   Oletta CohnuPreez, Anaiza Behrens OTR/L,CLT 06/11/2018, 12:33 PM  [place] [facility] [address] [place], [place], [address] Phone: [phone]   Fax:  [phone]  Name: Brian Kramer MRN: [id number] Date  of Birth: 1946-04-23

## 2018-06-16 ENCOUNTER — Ambulatory Visit: Payer: Medicare Other | Admitting: Occupational Therapy

## 2018-06-16 DIAGNOSIS — M79644 Pain in right finger(s): Secondary | ICD-10-CM

## 2018-06-16 DIAGNOSIS — M25641 Stiffness of right hand, not elsewhere classified: Secondary | ICD-10-CM | POA: Diagnosis not present

## 2018-06-16 DIAGNOSIS — M6281 Muscle weakness (generalized): Secondary | ICD-10-CM

## 2018-06-16 DIAGNOSIS — L905 Scar conditions and fibrosis of skin: Secondary | ICD-10-CM

## 2018-06-16 DIAGNOSIS — M25631 Stiffness of right wrist, not elsewhere classified: Secondary | ICD-10-CM

## 2018-06-16 NOTE — Therapy (Signed)
[facility] PHYSICAL AND SPORTS MEDICINE [address], [place], [address] Phone: [phone]   Fax:  [phone]  Occupational Therapy Treatment  Patient Details  Name: Brian Kramer MRN: [id number] Date of Birth: Feb 26, 1946 Referring Provider (OT): Rosita Kea   Encounter Date: 06/16/2018  OT End of Session - 06/16/18 1539    Visit Number  7    Number of Visits  12    Date for OT Re-Evaluation  06/22/18    OT Start Time  1400    OT Stop Time  1444    OT Time Calculation (min)  44 min    Activity Tolerance  Patient tolerated treatment well    Behavior During Therapy  [facility] for tasks assessed/performed       Past Medical History:  Diagnosis Date  . Anginal pain (HCC)   . Arthritis   . Coronary artery disease   . Diabetes mellitus without complication (HCC)    Took Metformin for a couple of years. Had bariatric surgery and no longer requires it. A1-C was 5.5 in October 2018.  Marland Kitchen GERD (gastroesophageal reflux disease)   . Headache   . History of hiatal hernia   . Myocardial infarction (HCC)   . Peripheral neuropathy   . PUD (peptic ulcer disease)   . RLS (restless legs syndrome)   . Sleep apnea    No requires the use of CPAP    Past Surgical History:  Procedure Laterality Date  . arthroscpic knee 2005 Right   . Cardiac Stent 1997    . Cardiac Stent 2000    . CARPOMETACARPAL ([facility]) FUSION OF THUMB Right 04/08/2018   Procedure: CARPOMETACARPAL [facility]) FUSION OF THUMB;  Surgeon: Kennedy Bucker, MD;  Location: [facility] ORS;  Service: Orthopedics;  Laterality: Right;  . COLONOSCOPY    . COLONOSCOPY WITH PROPOFOL N/A 05/19/2018   Procedure: COLONOSCOPY WITH PROPOFOL;  Surgeon: Toledo, Boykin Nearing, MD;  Location: [facility] ENDOSCOPY;  Service: Gastroenterology;  Laterality: N/A;  . CORONARY ANGIOPLASTY    . EYE SURGERY  2015   Bilateral cataract  . Gastric Sleeve Bariatric Surgery  2017  . HAMMER TOE SURGERY Right 01/15/2018   Procedure: HAMMER TOE  CORRECTION-2ND & 3RD;  Surgeon: Recardo Evangelist, DPM;  Location: [facility] ORS;  Service: Podiatry;  Laterality: Right;  . HERNIA REPAIR    . JOINT REPLACEMENT  2007   Left knee  . JOINT REPLACEMENT  2015   Right Knee  . left knee arthroscopy 2006 Left   . Right Heel Spur Resection 2012    . Right shoulder AC separation surgery in 1995 Right   . Stem Cells to left heel  2017  . TONSILLECTOMY    . UPPER GI ENDOSCOPY  2017  . Vasectomy 1970's    . WEIL OSTEOTOMY Right 01/15/2018   Procedure: WEIL OSTEOTOMY-2ND & 3RD;  Surgeon: Recardo Evangelist, DPM;  Location: [facility] ORS;  Service: Podiatry;  Laterality: Right;    There were no vitals filed for this visit.  Subjective Assessment - 06/16/18 1405    Subjective   No pain since last time -I did like you told me - did not over do my putty  but sticked with the reps and sets - what about my splint     Patient Stated Goals  Want to pain in my R thumb and hand better to be able to use it more - I love to play on computer, and cook     Currently in Pain?  No/denies         [facility] Assessment - 06/16/18 0001      Strength   Right Hand Grip (lbs)  46    Right Hand Lateral Pinch  11 lbs    Right Hand 3 Point Pinch  10 lbs    Left Hand Grip (lbs)  76    Left Hand Lateral Pinch  14 lbs    Left Hand 3 Point Pinch  14 lbs       grip did not increase but pain free this date  Prehension did increase 1-2 lbs - but also no pain  Pt to cont with 2 lbs weight 3 sets 12 for wrist  Wean out of splint during day -only on with act that cause pain and sleeping - pt report he moves a lot around         OT Treatments/Exercises (OP) - 06/16/18 0001      RUE Paraffin   Number Minutes Paraffin  10 Minutes    RUE Paraffin Location  Hand    Comments  end of session - prior to thumb MC flexion PROM and AROM        Upgrade gripping to green med firm putty - 12 reps  2  X day - increase in 3 days and 6 days - to  And 3 sets - if pain free  3 point  pinch and lat grip - to do teal but in ball - and slow  12 -15 reps - 1 set - 2 x day  Increase 2 an 3 sets in  And 4 days  Then can increase to green 12-15 reps -  BUT only  1 set again - 2 x day    done and add blocked PROM ( 4 x 30) and AROM to thumb MP  10 reps        OT Education - 06/16/18 1539    Education Details  upgrade putty HEP     Person(s) Educated  Patient    Methods  Explanation;Demonstration;Handout    Comprehension  Verbalized understanding;Returned demonstration       OT Short Term Goals - 06/11/18 1230      OT SHORT TERM GOAL #1   Title  Pt to be ind in HEP to decrease scar tissue, edema and pain and increase AROM to use hand in 50% or more of ADL's     Baseline  still wear splint at times - pain still increase - pt overdo HEP     Time  2    Period  Weeks    Status  On-going    Target Date  06/25/18      OT SHORT TERM GOAL #2   Title  Pain on PRWHE improve with more than 20 points     Baseline  Pain on PRWHE at eval 36/50 - still increase this past week when he over done HEP - 5/10 with grip on ulnar wrist     Time  2    Period  Weeks    Status  On-going    Target Date  06/25/18      OT SHORT TERM GOAL #3   Title  R hand digits flexion improve for pt to touch palm without increase syptoms     Status  Achieved        OT Long Term Goals - 06/11/18 1231      OT LONG TERM GOAL #1   Title  R thumb  AROM improve in PA and RA and flexion to grap empy cup and retrieve 1 inch object out of palm     Status  Achieved      OT LONG TERM GOAL #2   Title  R wrist AROM improve for pt to use R hand in more than 50%  bath L side of body, apply deodorant and dry with towel    Status  Achieved      OT LONG TERM GOAL #3   Title  Grip strength and prehension in R hand improve to more than 40% of L hand to do buttons, cut food, carry more than 5 lbs without increase symptoms     Baseline  increase pain with putty and gripping this past week- on ulnar side of  wrist     Time  2    Period  Weeks    Status  On-going    Target Date  06/25/18      OT LONG TERM GOAL #4   Title  Function on PRWHE improve with more than 20 points     Baseline  function score on PRWHE 45/50 - improving - will do next time     Time  3    Period  Weeks    Status  On-going    Target Date  07/02/18            Plan - 06/16/18 1540    Clinical Impression Statement  [facility] pt pain decrease since last time - after OT discuss with pt that he needs to increase ROM and strenght without increase pain - pt had last time pain in wrist and thumb after overdoing HEP again -mosty putty - did increase putty resistance  - but pt to stay with increasing reps and sets for HEP     Occupational performance deficits (Please refer to evaluation for details):  ADL's;IADL's;Play;Leisure;Social Participation    Rehab Potential  Good    OT Frequency  1x / week    OT Duration  2 weeks    OT Treatment/Interventions  Self-care/ADL training;Therapeutic exercise;Patient/family education;Splinting;Paraffin;Fluidtherapy;Contrast Bath;Manual Therapy;Passive range of motion;Functional Mobility Training;Scar mobilization    Plan  assess pain at wrist adn thumb with increase putty  HEP - progress with HEP - do PRWHE     Clinical Decision Making  Multiple treatment options, significant modification of task necessary    OT Home Exercise Plan  see pt instruction    Consulted and Agree with Plan of Care  Patient       Patient will benefit from skilled therapeutic intervention in order to improve the following deficits and impairments:  Pain, Impaired flexibility, Increased edema, Decreased scar mobility, Decreased coordination, Decreased range of motion, Decreased strength, Impaired UE functional use, Decreased knowledge of precautions  Visit Diagnosis: Stiffness of right hand, not elsewhere classified  Pain in right finger(s)  Stiffness of right wrist, not  elsewhere classified  Scar condition and fibrosis of skin  Muscle weakness (generalized)    Problem List There are no active problems to display for this patient.   Oletta CohnuPreez, Michio Thier OTR/L,CLT 06/16/2018, 3:44 PM  [facility] [address] [place], [place], [id number] Phone: [phone]   Fax:  [phone]  Name: Brian Kramer MRN: [id number] Date of Birth: May 29, 1946

## 2018-06-16 NOTE — Patient Instructions (Signed)
Upgrade gripping to green med firm putty - 12 reps  2  X day - increase in 3 days and 6 days - to  And 3 sets - if pain free  3 point pinch and lat grip - to do teal but in ball - and slow  12 -15 reps - 1 set - 2 x day  Increase 2 an 3 sets in  And 4 days  Then can increase to green 12-15 reps -  BUT only  1 set again - 2 x day  Cont 2 lbs weight  And add blocked PROM ( 4 x 30) and AROM to thumb MP  10 reps

## 2018-06-24 ENCOUNTER — Ambulatory Visit: Payer: Medicare Other | Admitting: Occupational Therapy

## 2018-06-24 DIAGNOSIS — M25631 Stiffness of right wrist, not elsewhere classified: Secondary | ICD-10-CM

## 2018-06-24 DIAGNOSIS — L905 Scar conditions and fibrosis of skin: Secondary | ICD-10-CM

## 2018-06-24 DIAGNOSIS — M79644 Pain in right finger(s): Secondary | ICD-10-CM

## 2018-06-24 DIAGNOSIS — M25641 Stiffness of right hand, not elsewhere classified: Secondary | ICD-10-CM | POA: Diagnosis not present

## 2018-06-24 DIAGNOSIS — M6281 Muscle weakness (generalized): Secondary | ICD-10-CM

## 2018-06-24 NOTE — Patient Instructions (Signed)
Cont with green putty 3 sets for grip , lat and 3 point  But increase to 20 reps each  And then add firm putty 1/2 ball to add to green and work on that the next month - but start back at 1set and increase to 3 sets

## 2018-06-24 NOTE — Therapy (Signed)
[place] PHYSICAL AND SPORTS MEDICINE [address], [place], [address] Phone: [phone]   Fax:  [phone]  Occupational Therapy Treatment/discharge  Patient Details  Name: Brian Kramer MRN: [id number] Date of Birth: Sep 11, 1945 Referring Provider (OT): Rudene Christians   Encounter Date: 06/24/2018  OT End of Session - 06/24/18 1038    Visit Number  8    Number of Visits  8    Date for OT Re-Evaluation  06/24/18    OT Start Time  1001    OT Stop Time  1033    OT Time Calculation (min)  32 min    Activity Tolerance  Patient tolerated treatment well    Behavior During Therapy  [facility] for tasks assessed/performed       Past Medical History:  Diagnosis Date  . Anginal pain ([place])   . Arthritis   . Coronary artery disease   . Diabetes mellitus without complication (HCC)    Took Metformin for a couple of years. Had bariatric surgery and no longer requires it. A1-C was 5.5 in October 2018.  Marland Kitchen GERD (gastroesophageal reflux disease)   . Headache   . History of hiatal hernia   . Myocardial infarction ([place])   . Peripheral neuropathy   . PUD (peptic ulcer disease)   . RLS (restless legs syndrome)   . Sleep apnea    No requires the use of CPAP    Past Surgical History:  Procedure Laterality Date  . arthroscpic knee 2005 Right   . Cardiac Stent 1997    . Cardiac Stent 2000    . CARPOMETACARPAL ([place]) FUSION OF THUMB Right 04/08/2018   Procedure: CARPOMETACARPAL [facility]) FUSION OF THUMB;  Surgeon: Hessie Knows, MD;  Location: [facility] ORS;  Service: Orthopedics;  Laterality: Right;  . COLONOSCOPY    . COLONOSCOPY WITH PROPOFOL N/A 05/19/2018   Procedure: COLONOSCOPY WITH PROPOFOL;  Surgeon: Toledo, Benay Pike, MD;  Location: [facility] ENDOSCOPY;  Service: Gastroenterology;  Laterality: N/A;  . CORONARY ANGIOPLASTY    . EYE SURGERY  2015   Bilateral cataract  . Gastric Sleeve Bariatric Surgery  2017  . HAMMER TOE SURGERY Right 01/15/2018   Procedure:  HAMMER TOE CORRECTION-2ND & 3RD;  Surgeon: Albertine Patricia, DPM;  Location: [facility] ORS;  Service: Podiatry;  Laterality: Right;  . HERNIA REPAIR    . JOINT REPLACEMENT  2007   Left knee  . JOINT REPLACEMENT  2015   Right Knee  . left knee arthroscopy 2006 Left   . Right Heel Spur Resection 2012    . Right shoulder AC separation surgery in 1995 Right   . Stem Cells to left heel  2017  . TONSILLECTOMY    . UPPER GI ENDOSCOPY  2017  . Vasectomy 1970's    . WEIL OSTEOTOMY Right 01/15/2018   Procedure: [place];  Surgeon: Albertine Patricia, DPM;  Location: [facility] ORS;  Service: Podiatry;  Laterality: Right;    There were no vitals filed for this visit.  Subjective Assessment - 06/24/18 1036    Subjective   Doing okay - no pain - maybe in the am when getting up - little stiff- but did okay with my putty and weight and using it more     Patient Stated Goals  Want to pain in my R thumb and hand better to be able to use it more - I love to play on computer, and cook     Currently in Pain?  No/denies  [facility] OT Assessment - 06/24/18 0001      Strength   Right Hand Grip (lbs)  55    Right Hand Lateral Pinch  12 lbs    Right Hand 3 Point Pinch  11 lbs    Left Hand Grip (lbs)  76    Left Hand Lateral Pinch  14 lbs    Left Hand 3 Point Pinch  14 lbs       Done and simulate PRWHE for pain and function  Pain now 3/50 and was 36/50  Function score on PRWHE at eval 45 and now 0.5/50  Pt show Opposition to base of 5th - progress to MP flexion of thumb 35 pt to cont working on MP blocked flexion to 40  Wrist AROM WFL and strength 4+/5  No pain with pushing up from chair or carry and lift 10 lbs       Pt to cont with green putty 3 sets for grip , lat and 3 point  But increase to 20 reps each  And then add firm putty 1/2 ball to add to green and work on that the next month - but start back at 1set and increase to 3 sets           OT Education - 06/24/18 1038     Education Details  upgrade putty HEP and dicharge instruction -and discharge night splint     Person(s) Educated  Patient    Methods  Explanation;Demonstration;Handout    Comprehension  Verbalized understanding;Returned demonstration       OT Short Term Goals - 06/24/18 1042      OT SHORT TERM GOAL #1   Title  Pt to be ind in HEP to decrease scar tissue, edema and pain and increase AROM to use hand in 50% or more of ADL's     Status  Achieved      OT SHORT TERM GOAL #2   Title  Pain on PRWHE improve with more than 20 points     Baseline  Pain on PRWHE at eval 36/50 - and now 3/50    Status  Achieved      OT SHORT TERM GOAL #3   Title  R hand digits flexion improve for pt to touch palm without increase syptoms     Status  Achieved        OT Long Term Goals - 06/24/18 1043      OT LONG TERM GOAL #1   Title  R thumb AROM improve in PA and RA and flexion to grap empy cup and retrieve 1 inch object out of palm     Baseline  see flowsheet    Status  Achieved      OT LONG TERM GOAL #2   Title  R wrist AROM improve for pt to use R hand in more than 50%  bath L side of body, apply deodorant and dry with towel    Baseline  see flowsheet    Status  Achieved      OT LONG TERM GOAL #3   Title  Grip strength and prehension in R hand improve to more than 40% of L hand to do buttons, cut food, carry more than 5 lbs without increase symptoms     Baseline  grip R 55/ L 76; R lat and 3 point 12 and 11 lbs - L 14lbs for both    Status  Achieved      OT LONG TERM GOAL #4  Title  Function on PRWHE improve with more than 20 points     Baseline  function score on PRWHE 45/50 - and now 0.5/50    Status  Achieved            Plan - 06/24/18 1039    Clinical Impression Statement  Pt is 11 wks s/p R CMC arthroplasty - doing very well -after he over done HEP during rehab and OT had to reinforce with pt to increase gradually and with no pain - showed great progress in grip and prehension  - and met all goals - function on PRWHE improve to 0.5/50  and at eval was 45/50 and pain 3/50 - pt discharge at this time with HEP -and pt to wean out of night splint this week and wear neoprene thumb CMC splint as needed     Plan  pt discharge with homeprogram    OT Home Exercise Plan  see pt instruction    Consulted and Agree with Plan of Care  Patient;Family member/caregiver       Patient will benefit from skilled therapeutic intervention in order to improve the following deficits and impairments:     Visit Diagnosis: Stiffness of right hand, not elsewhere classified - Plan: Ot plan of care cert/re-cert  Pain in right finger(s) - Plan: Ot plan of care cert/re-cert  Stiffness of right wrist, not elsewhere classified - Plan: Ot plan of care cert/re-cert  Scar condition and fibrosis of skin - Plan: Ot plan of care cert/re-cert  Muscle weakness (generalized) - Plan: Ot plan of care cert/re-cert    Problem List There are no active problems to display for this patient.   Rosalyn Gess  OTR/L,CLT 06/24/2018, 10:47 AM  [place] PHYSICAL AND SPORTS MEDICINE [address], [place], [address] Phone: [phone]   Fax:  [phone]  Name: Brian Kramer MRN: [id number] Date of Birth: 1945/07/07

## 2018-10-04 DIAGNOSIS — F321 Major depressive disorder, single episode, moderate: Secondary | ICD-10-CM | POA: Insufficient documentation

## 2018-11-29 ENCOUNTER — Other Ambulatory Visit: Payer: Self-pay

## 2018-11-29 ENCOUNTER — Encounter
Admission: RE | Admit: 2018-11-29 | Discharge: 2018-11-29 | Disposition: A | Discharge status: Home / Self Care | Payer: Medicare Other | Source: Ambulatory Visit | Attending: Orthopedic Surgery | Admitting: Orthopedic Surgery

## 2018-11-29 DIAGNOSIS — Z9884 Bariatric surgery status: Secondary | ICD-10-CM | POA: Diagnosis not present

## 2018-11-29 DIAGNOSIS — Z79899 Other long term (current) drug therapy: Secondary | ICD-10-CM | POA: Diagnosis not present

## 2018-11-29 DIAGNOSIS — R001 Bradycardia, unspecified: Secondary | ICD-10-CM | POA: Insufficient documentation

## 2018-11-29 DIAGNOSIS — Z8711 Personal history of peptic ulcer disease: Secondary | ICD-10-CM | POA: Diagnosis not present

## 2018-11-29 DIAGNOSIS — Z955 Presence of coronary angioplasty implant and graft: Secondary | ICD-10-CM | POA: Diagnosis not present

## 2018-11-29 DIAGNOSIS — J310 Chronic rhinitis: Secondary | ICD-10-CM | POA: Diagnosis not present

## 2018-11-29 DIAGNOSIS — Z888 Allergy status to other drugs, medicaments and biological substances status: Secondary | ICD-10-CM | POA: Diagnosis not present

## 2018-11-29 DIAGNOSIS — Z7982 Long term (current) use of aspirin: Secondary | ICD-10-CM | POA: Diagnosis not present

## 2018-11-29 DIAGNOSIS — G4733 Obstructive sleep apnea (adult) (pediatric): Secondary | ICD-10-CM | POA: Diagnosis not present

## 2018-11-29 DIAGNOSIS — M1990 Unspecified osteoarthritis, unspecified site: Secondary | ICD-10-CM | POA: Diagnosis not present

## 2018-11-29 DIAGNOSIS — M1812 Unilateral primary osteoarthritis of first carpometacarpal joint, left hand: Secondary | ICD-10-CM | POA: Diagnosis present

## 2018-11-29 DIAGNOSIS — I252 Old myocardial infarction: Secondary | ICD-10-CM | POA: Insufficient documentation

## 2018-11-29 DIAGNOSIS — Z1159 Encounter for screening for other viral diseases: Secondary | ICD-10-CM | POA: Diagnosis not present

## 2018-11-29 DIAGNOSIS — K449 Diaphragmatic hernia without obstruction or gangrene: Secondary | ICD-10-CM | POA: Diagnosis not present

## 2018-11-29 DIAGNOSIS — Z9841 Cataract extraction status, right eye: Secondary | ICD-10-CM | POA: Diagnosis not present

## 2018-11-29 DIAGNOSIS — E1140 Type 2 diabetes mellitus with diabetic neuropathy, unspecified: Secondary | ICD-10-CM | POA: Diagnosis not present

## 2018-11-29 DIAGNOSIS — Z96653 Presence of artificial knee joint, bilateral: Secondary | ICD-10-CM | POA: Diagnosis not present

## 2018-11-29 DIAGNOSIS — G4700 Insomnia, unspecified: Secondary | ICD-10-CM | POA: Diagnosis not present

## 2018-11-29 DIAGNOSIS — I2510 Atherosclerotic heart disease of native coronary artery without angina pectoris: Secondary | ICD-10-CM | POA: Diagnosis not present

## 2018-11-29 DIAGNOSIS — R9431 Abnormal electrocardiogram [ECG] [EKG]: Secondary | ICD-10-CM | POA: Insufficient documentation

## 2018-11-29 DIAGNOSIS — Z9842 Cataract extraction status, left eye: Secondary | ICD-10-CM | POA: Diagnosis not present

## 2018-11-29 DIAGNOSIS — E785 Hyperlipidemia, unspecified: Secondary | ICD-10-CM | POA: Diagnosis not present

## 2018-11-29 DIAGNOSIS — G2581 Restless legs syndrome: Secondary | ICD-10-CM | POA: Diagnosis not present

## 2018-11-29 DIAGNOSIS — Z87891 Personal history of nicotine dependence: Secondary | ICD-10-CM | POA: Diagnosis not present

## 2018-11-29 DIAGNOSIS — Z01818 Encounter for other preprocedural examination: Secondary | ICD-10-CM | POA: Insufficient documentation

## 2018-11-29 DIAGNOSIS — Z880 Allergy status to penicillin: Secondary | ICD-10-CM | POA: Diagnosis not present

## 2018-11-29 HISTORY — DX: Insomnia, unspecified: G47.00

## 2018-11-29 LAB — SURGICAL PCR SCREEN
MRSA, PCR: NEGATIVE
Staphylococcus aureus: NEGATIVE

## 2018-11-29 NOTE — Patient Instructions (Addendum)
Your procedure is scheduled on: Thurs 6/25 Report to Day Surgery. To find out your arrival time please call [phone] between 1PM - 3PM on Wed.6/24.  Remember: Instructions that are not followed completely may result in serious medical risk,  up to and including death, or upon the discretion of your surgeon and anesthesiologist your  surgery may need to be rescheduled.     _X__ 1. Do not eat food after midnight the night before your procedure.                 No gum chewing or hard candies. You may drink clear liquids up to 2 hours                 before you are scheduled to arrive for your surgery- DO not drink clear                 liquids within 2 hours of the start of your surgery.                 Clear Liquids include:  water,  Black Coffee or Tea (Do not add                 anything to coffee or tea).  __X__2.  On the morning of surgery brush your teeth with toothpaste and water, you                may rinse your mouth with mouthwash if you wish.  Do not swallow any toothpaste of mouthwash.     _X__ 3.  No Alcohol for 24 hours before or after surgery.   ___ 4.  Do Not Smoke or use e-cigarettes For 24 Hours Prior to Your Surgery.                 Do not use any chewable tobacco products for at least 6 hours prior to                 surgery.  ____  5.  Bring all medications with you on the day of surgery if instructed.   __x__  6.  Notify your doctor if there is any change in your medical condition      (cold, fever, infections).     Do not wear jewelry, make-up, hairpins, clips or nail polish. Do not wear lotions, powders, or perfumes. You may wear deodorant. Do not shave 48 hours prior to surgery. Men may shave face and neck. Do not bring valuables to the hospital.    [facility] is not responsible for any belongings or valuables.  Contacts, dentures or bridgework may not be worn into surgery. Leave your suitcase in the car. After surgery it may  be brought to your room. For patients admitted to the hospital, discharge time is determined by your treatment team.   Patients discharged the day of surgery will not be allowed to drive home.   Please read over the following fact sheets that you were given:    _x___ Take these medicines the morning of surgery with A SIP OF WATER:    1. Lyrica  2.   3.   4.  5.  6.  ____ Fleet Enema (as directed)   __x__ Use CHG Soap as directed  __x__ Use inhalers on the day of surgery ipratropium (ATROVENT) 0.03 % nasal spray  ____ Stop metformin 2 days prior to surgery    ____ Take 1/2 of usual insulin dose the night  before surgery. No insulin the morning          of surgery.   __x__ Stop aspirin today  __x__ Stop Anti-inflammatories No IBuprofen or ALeve.  May take tylenol   ____ Stop supplements until after surgery.    ____ Bring C-Pap to the hospital.

## 2018-11-30 LAB — NOVEL CORONAVIRUS, NAA (HOSP ORDER, SEND-OUT TO REF LAB; TAT 18-24 HRS): SARS-CoV-2, NAA: NOT DETECTED

## 2018-12-01 MED ORDER — CLINDAMYCIN PHOSPHATE 900 MG/50ML IV SOLN
900.0000 mg | Freq: Once | INTRAVENOUS | Status: AC
  Administered 2018-12-02: 900 mg via INTRAVENOUS

## 2018-12-02 ENCOUNTER — Ambulatory Visit: Payer: Medicare Other | Admitting: Anesthesiology

## 2018-12-02 ENCOUNTER — Encounter
Admission: RE | Disposition: A | Discharge status: Home / Self Care | Payer: Self-pay | Source: Home / Self Care | Attending: Orthopedic Surgery

## 2018-12-02 ENCOUNTER — Other Ambulatory Visit: Payer: Self-pay

## 2018-12-02 ENCOUNTER — Encounter: Payer: Self-pay | Admitting: *Deleted

## 2018-12-02 ENCOUNTER — Ambulatory Visit
Admission: RE | Admit: 2018-12-02 | Discharge: 2018-12-02 | Disposition: A | Discharge status: Home / Self Care | Payer: Medicare Other | Attending: Orthopedic Surgery | Admitting: Orthopedic Surgery

## 2018-12-02 DIAGNOSIS — Z79899 Other long term (current) drug therapy: Secondary | ICD-10-CM | POA: Insufficient documentation

## 2018-12-02 DIAGNOSIS — Z96653 Presence of artificial knee joint, bilateral: Secondary | ICD-10-CM | POA: Insufficient documentation

## 2018-12-02 DIAGNOSIS — Z880 Allergy status to penicillin: Secondary | ICD-10-CM | POA: Insufficient documentation

## 2018-12-02 DIAGNOSIS — E785 Hyperlipidemia, unspecified: Secondary | ICD-10-CM | POA: Insufficient documentation

## 2018-12-02 DIAGNOSIS — Z7982 Long term (current) use of aspirin: Secondary | ICD-10-CM | POA: Insufficient documentation

## 2018-12-02 DIAGNOSIS — Z9884 Bariatric surgery status: Secondary | ICD-10-CM | POA: Insufficient documentation

## 2018-12-02 DIAGNOSIS — J310 Chronic rhinitis: Secondary | ICD-10-CM | POA: Insufficient documentation

## 2018-12-02 DIAGNOSIS — M1812 Unilateral primary osteoarthritis of first carpometacarpal joint, left hand: Secondary | ICD-10-CM | POA: Diagnosis not present

## 2018-12-02 DIAGNOSIS — Z9841 Cataract extraction status, right eye: Secondary | ICD-10-CM | POA: Insufficient documentation

## 2018-12-02 DIAGNOSIS — Z9842 Cataract extraction status, left eye: Secondary | ICD-10-CM | POA: Insufficient documentation

## 2018-12-02 DIAGNOSIS — Z8711 Personal history of peptic ulcer disease: Secondary | ICD-10-CM | POA: Insufficient documentation

## 2018-12-02 DIAGNOSIS — Z87891 Personal history of nicotine dependence: Secondary | ICD-10-CM | POA: Insufficient documentation

## 2018-12-02 DIAGNOSIS — G4700 Insomnia, unspecified: Secondary | ICD-10-CM | POA: Insufficient documentation

## 2018-12-02 DIAGNOSIS — I2510 Atherosclerotic heart disease of native coronary artery without angina pectoris: Secondary | ICD-10-CM | POA: Insufficient documentation

## 2018-12-02 DIAGNOSIS — M1990 Unspecified osteoarthritis, unspecified site: Secondary | ICD-10-CM | POA: Insufficient documentation

## 2018-12-02 DIAGNOSIS — K449 Diaphragmatic hernia without obstruction or gangrene: Secondary | ICD-10-CM | POA: Insufficient documentation

## 2018-12-02 DIAGNOSIS — I252 Old myocardial infarction: Secondary | ICD-10-CM | POA: Insufficient documentation

## 2018-12-02 DIAGNOSIS — G4733 Obstructive sleep apnea (adult) (pediatric): Secondary | ICD-10-CM | POA: Insufficient documentation

## 2018-12-02 DIAGNOSIS — Z888 Allergy status to other drugs, medicaments and biological substances status: Secondary | ICD-10-CM | POA: Insufficient documentation

## 2018-12-02 DIAGNOSIS — Z955 Presence of coronary angioplasty implant and graft: Secondary | ICD-10-CM | POA: Insufficient documentation

## 2018-12-02 DIAGNOSIS — Z1159 Encounter for screening for other viral diseases: Secondary | ICD-10-CM | POA: Insufficient documentation

## 2018-12-02 DIAGNOSIS — G2581 Restless legs syndrome: Secondary | ICD-10-CM | POA: Insufficient documentation

## 2018-12-02 DIAGNOSIS — E1140 Type 2 diabetes mellitus with diabetic neuropathy, unspecified: Secondary | ICD-10-CM | POA: Insufficient documentation

## 2018-12-02 HISTORY — PX: CARPOMETACARPAL (CMC) FUSION OF THUMB: SHX6290

## 2018-12-02 LAB — GLUCOSE, CAPILLARY
Glucose-Capillary: 92 mg/dL (ref 70–99)
Glucose-Capillary: 86 mg/dL (ref 70–99)

## 2018-12-02 SURGERY — CARPOMETACARPAL (CMC) FUSION OF THUMB
Anesthesia: General | Laterality: Left

## 2018-12-02 MED ORDER — OXYCODONE HCL 5 MG PO TABS: 5.0000 mg | ORAL_TABLET | ORAL | Status: DC | PRN

## 2018-12-02 MED ORDER — METOCLOPRAMIDE HCL 10 MG PO TABS: 5.0000 mg | ORAL_TABLET | Freq: Three times a day (TID) | ORAL | Status: DC | PRN

## 2018-12-02 MED ORDER — BUPIVACAINE HCL (PF) 0.5 % IJ SOLN
INTRAMUSCULAR | Status: DC | PRN
  Administered 2018-12-02: 10 mL

## 2018-12-02 MED ORDER — GLYCOPYRROLATE 0.2 MG/ML IJ SOLN
INTRAMUSCULAR | Status: DC | PRN
  Administered 2018-12-02: 0.2 mg via INTRAVENOUS

## 2018-12-02 MED ORDER — OXYCODONE-ACETAMINOPHEN 5-325 MG PO TABS: 1.0000 | ORAL_TABLET | ORAL | 0 refills | Status: DC | PRN

## 2018-12-02 MED ORDER — KETAMINE HCL 50 MG/ML IJ SOLN
INTRAMUSCULAR | Status: DC | PRN
  Administered 2018-12-02: 50 mg via INTRAMUSCULAR

## 2018-12-02 MED ORDER — ONDANSETRON HCL 4 MG/2ML IJ SOLN
INTRAMUSCULAR | Status: AC
  Filled 2018-12-02: qty 2

## 2018-12-02 MED ORDER — FENTANYL CITRATE (PF) 100 MCG/2ML IJ SOLN
INTRAMUSCULAR | Status: AC
  Administered 2018-12-02: 13:00:00 50 ug via INTRAVENOUS
  Filled 2018-12-02: qty 2

## 2018-12-02 MED ORDER — OXYCODONE HCL 5 MG PO TABS: 5.0000 mg | ORAL_TABLET | Freq: Once | ORAL | Status: DC | PRN

## 2018-12-02 MED ORDER — ONDANSETRON HCL 4 MG PO TABS: 4.0000 mg | ORAL_TABLET | Freq: Four times a day (QID) | ORAL | Status: DC | PRN

## 2018-12-02 MED ORDER — ONDANSETRON HCL 4 MG/2ML IJ SOLN: 4.0000 mg | Freq: Four times a day (QID) | INTRAMUSCULAR | Status: DC | PRN

## 2018-12-02 MED ORDER — GELATIN ABSORBABLE 12-7 MM EX MISC
CUTANEOUS | Status: DC | PRN
  Administered 2018-12-02: 1

## 2018-12-02 MED ORDER — EPHEDRINE SULFATE 50 MG/ML IJ SOLN
INTRAMUSCULAR | Status: AC
  Filled 2018-12-02: qty 1

## 2018-12-02 MED ORDER — METOCLOPRAMIDE HCL 5 MG/ML IJ SOLN: 5.0000 mg | Freq: Three times a day (TID) | INTRAMUSCULAR | Status: DC | PRN

## 2018-12-02 MED ORDER — PROPOFOL 10 MG/ML IV BOLUS
INTRAVENOUS | Status: AC
  Filled 2018-12-02: qty 20

## 2018-12-02 MED ORDER — GELATIN ABSORBABLE 12-7 MM EX MISC
CUTANEOUS | Status: AC
  Filled 2018-12-02: qty 1

## 2018-12-02 MED ORDER — LIDOCAINE HCL (CARDIAC) PF 100 MG/5ML IV SOSY
PREFILLED_SYRINGE | INTRAVENOUS | Status: DC | PRN
  Administered 2018-12-02: 100 mg via INTRAVENOUS

## 2018-12-02 MED ORDER — FENTANYL CITRATE (PF) 100 MCG/2ML IJ SOLN
INTRAMUSCULAR | Status: DC | PRN
  Administered 2018-12-02 (×4): 25 ug via INTRAVENOUS

## 2018-12-02 MED ORDER — FENTANYL CITRATE (PF) 100 MCG/2ML IJ SOLN
25.0000 ug | INTRAMUSCULAR | Status: DC | PRN
  Administered 2018-12-02 (×2): 25 ug via INTRAVENOUS
  Administered 2018-12-02: 13:00:00 50 ug via INTRAVENOUS
  Administered 2018-12-02 (×2): 25 ug via INTRAVENOUS

## 2018-12-02 MED ORDER — DEXAMETHASONE SODIUM PHOSPHATE 10 MG/ML IJ SOLN
INTRAMUSCULAR | Status: AC
  Filled 2018-12-02: qty 1

## 2018-12-02 MED ORDER — FENTANYL CITRATE (PF) 100 MCG/2ML IJ SOLN
INTRAMUSCULAR | Status: AC
  Filled 2018-12-02: qty 2

## 2018-12-02 MED ORDER — HYDROMORPHONE HCL 1 MG/ML IJ SOLN: 0.5000 mg | INTRAMUSCULAR | Status: DC | PRN

## 2018-12-02 MED ORDER — LIDOCAINE HCL (PF) 2 % IJ SOLN
INTRAMUSCULAR | Status: AC
  Filled 2018-12-02: qty 10

## 2018-12-02 MED ORDER — SODIUM CHLORIDE 0.9 % IV SOLN
INTRAVENOUS | Status: DC
  Administered 2018-12-02: 09:00:00 via INTRAVENOUS

## 2018-12-02 MED ORDER — NEOMYCIN-POLYMYXIN B GU 40-200000 IR SOLN
Status: AC
  Filled 2018-12-02: qty 2

## 2018-12-02 MED ORDER — SODIUM CHLORIDE 0.9 % IV SOLN: INTRAVENOUS | Status: DC

## 2018-12-02 MED ORDER — ACETAMINOPHEN 325 MG PO TABS: 325.0000 mg | ORAL_TABLET | Freq: Four times a day (QID) | ORAL | Status: DC | PRN

## 2018-12-02 MED ORDER — OXYCODONE HCL 5 MG PO TABS: 10.0000 mg | ORAL_TABLET | ORAL | Status: DC | PRN

## 2018-12-02 MED ORDER — LACTATED RINGERS IV SOLN: INTRAVENOUS | Status: DC

## 2018-12-02 MED ORDER — ONDANSETRON HCL 4 MG/2ML IJ SOLN
INTRAMUSCULAR | Status: DC | PRN
  Administered 2018-12-02: 4 mg via INTRAVENOUS

## 2018-12-02 MED ORDER — EPHEDRINE SULFATE 50 MG/ML IJ SOLN
INTRAMUSCULAR | Status: DC | PRN
  Administered 2018-12-02 (×2): 15 mg via INTRAVENOUS
  Administered 2018-12-02: 5 mg via INTRAVENOUS

## 2018-12-02 MED ORDER — FAMOTIDINE 20 MG PO TABS
ORAL_TABLET | ORAL | Status: AC
  Filled 2018-12-02: qty 1

## 2018-12-02 MED ORDER — CLINDAMYCIN PHOSPHATE 900 MG/50ML IV SOLN
INTRAVENOUS | Status: AC
  Filled 2018-12-02: qty 50

## 2018-12-02 MED ORDER — BUPIVACAINE HCL (PF) 0.5 % IJ SOLN
INTRAMUSCULAR | Status: AC
  Filled 2018-12-02: qty 30

## 2018-12-02 MED ORDER — PROPOFOL 10 MG/ML IV BOLUS
INTRAVENOUS | Status: DC | PRN
  Administered 2018-12-02: 180 mg via INTRAVENOUS

## 2018-12-02 MED ORDER — TRAMADOL HCL 50 MG PO TABS: 50.0000 mg | ORAL_TABLET | Freq: Four times a day (QID) | ORAL | Status: DC

## 2018-12-02 MED ORDER — GLYCOPYRROLATE 0.2 MG/ML IJ SOLN
INTRAMUSCULAR | Status: AC
  Filled 2018-12-02: qty 1

## 2018-12-02 MED ORDER — OXYCODONE HCL 5 MG/5ML PO SOLN: 5.0000 mg | Freq: Once | ORAL | Status: DC | PRN

## 2018-12-02 MED ORDER — DEXAMETHASONE SODIUM PHOSPHATE 10 MG/ML IJ SOLN
INTRAMUSCULAR | Status: DC | PRN
  Administered 2018-12-02: 10 mg via INTRAVENOUS

## 2018-12-02 MED ORDER — FAMOTIDINE 20 MG PO TABS
20.0000 mg | ORAL_TABLET | Freq: Once | ORAL | Status: AC
  Administered 2018-12-02: 10:00:00 20 mg via ORAL

## 2018-12-02 SURGICAL SUPPLY — 31 items
BANDAGE ELASTIC 3 LF NS (GAUZE/BANDAGES/DRESSINGS) ×3 IMPLANT
BLADE OSC/SAGITTAL 5.5X25 (BLADE) ×3 IMPLANT
BNDG CONFORM 3 STRL LF (GAUZE/BANDAGES/DRESSINGS) ×3 IMPLANT
CAST PADDING 3X4FT ST 30246 (SOFTGOODS) ×2
COVER WAND RF STERILE (DRAPES) ×3 IMPLANT
CUFF TOURN SGL QUICK 18X4 (TOURNIQUET CUFF) IMPLANT
DRAPE FLUOR MINI C-ARM 54X84 (DRAPES) ×3 IMPLANT
ELECT CAUTERY BLADE 6.4 (BLADE) ×3 IMPLANT
GAUZE SPONGE 4X4 12PLY STRL (GAUZE/BANDAGES/DRESSINGS) ×3 IMPLANT
GAUZE XEROFORM 1X8 LF (GAUZE/BANDAGES/DRESSINGS) ×3 IMPLANT
GLOVE SURG SYN 9.0  PF PI (GLOVE) ×2
GLOVE SURG SYN 9.0 PF PI (GLOVE) ×1 IMPLANT
GOWN SRG 2XL LVL 4 RGLN SLV (GOWNS) ×1 IMPLANT
GOWN STRL NON-REIN 2XL LVL4 (GOWNS) ×2
GOWN STRL REUS W/ TWL LRG LVL3 (GOWN DISPOSABLE) ×1 IMPLANT
GOWN STRL REUS W/TWL LRG LVL3 (GOWN DISPOSABLE) ×2
KIT TURNOVER KIT A (KITS) ×3 IMPLANT
NS IRRIG 500ML POUR BTL (IV SOLUTION) ×3 IMPLANT
PACK EXTREMITY ARMC (MISCELLANEOUS) ×3 IMPLANT
PAD CAST CTTN 3X4 STRL (SOFTGOODS) ×1 IMPLANT
SCALPEL PROTECTED #15 DISP (BLADE) ×6 IMPLANT
SPLINT CAST 1 STEP 3X12 (MISCELLANEOUS) ×3 IMPLANT
SPLINT WRIST M LT TX990308 (SOFTGOODS) ×3 IMPLANT
SUT ETHILON 4-0 (SUTURE) ×2
SUT ETHILON 4-0 FS2 18XMFL BLK (SUTURE) ×1
SUT VIC AB 0 CT2 27 (SUTURE) ×6 IMPLANT
SUT VIC AB 3-0 SH 27 (SUTURE) ×2
SUT VIC AB 3-0 SH 27X BRD (SUTURE) ×1 IMPLANT
SUTURE ETHLN 4-0 FS2 18XMF BLK (SUTURE) ×1 IMPLANT
SYSTEM IMPLANT TIGHTROPE MINI (Anchor) ×3 IMPLANT
WIRE Z .062 C-WIRE SPADE TIP (WIRE) ×6 IMPLANT

## 2018-12-02 NOTE — Op Note (Signed)
12/02/2018  12:13 PM  PATIENT:  Brian Kramer  73 y.o. male  PRE-OPERATIVE DIAGNOSIS:  PRIMARY OSTEOARTHRITIS OF FIRST CARPOMETACARPAL JOINT left  POST-OPERATIVE DIAGNOSIS:  PRIMARY OSTEOARTHRITIS OF FIRST CARPOMETACARPAL JOINT left  PROCEDURE:  Procedure(s): CARPOMETACARPAL ([facility]) FUSION OF  LEFT THUMB, DIABETIC (Left)  SURGEON: Laurene Footman, MD  ASSISTANTS: None  ANESTHESIA:   general  EBL:  Total I/O In: 800 [I.V.:800] Out: 3 [Blood:3]  BLOOD ADMINISTERED:none  DRAINS: none   LOCAL MEDICATIONS USED:  MARCAINE     SPECIMEN:  No Specimen  DISPOSITION OF SPECIMEN:  N/A  COUNTS:  YES  TOURNIQUET:   Total Tourniquet Time Documented: Upper Arm (Left) - 57 minutes Total: Upper Arm (Left) - 57 minutes   IMPLANTS: Arthrex mini tight rope anchor x1  DICTATION: .Dragon Dictation  patient was brought to the operating room and after adequate general anesthesia was obtained the left arm was prepped and draped in the usual sterile fashion with a tourniquet applied the upper forearm.  After patient identification and timeout procedures were completed tourniquet was raised and a curvilinear incision was made at the base of the first metacarpal towards the radial styloid at the interval between dorsal and volar skin.  Skin and subcutaneous tissue were spread and superficial branches of the radial nerve protected the capsule was exposed exposed and arthrotomy carried out with exposure of the thumb CMC joint with significant degenerative change present as well as synovitis the trapezium was removed in pieces and after removal of the entire trapezium a guidewire was inserted from the base of the first metacarpal into the base of the second with the tight rope mini tight rope being then passed with the anchor at the base of the thumb.  The anchor on the contralateral side was then placed through the sutures and brought down to the bone with a single suture with the thumb at the appropriate  level slightly tightened there was no pistoning in position of the thumb" motion of the thumb was quite good so was felt this was appropriate tension and position of implant so further knots were placed to fix in this position with the sutures then cut the wound was thoroughly irrigated and 10 cc of half percent Sensorcaine was infiltrated to the both incisions the wound on the dorsum of the hand with a guidewire came out of the base of the second metacarpal and at the area of the thumb CMC joint the joint was then filled with Gelfoam and then the capsule closed with #1 Vicryl tightly and then 3-0 Vicryl subcutaneously followed by 4-0 nylon for the skin in a simple interrupted fashion Xeroform 4 x 4 web roll and thumb spica splint applied with fiberglass splint material.  Tourniquet was let down after Ace wrap was applied patient sent to recovery in stable condition   PLAN OF CARE: Discharge to home after PACU  PATIENT DISPOSITION:  PACU - hemodynamically stable.

## 2018-12-02 NOTE — Transfer of Care (Signed)
Immediate Anesthesia Transfer of Care Note  Patient: Brian Kramer  Procedure(s) Performed: CARPOMETACARPAL ([facility]) FUSION OF  LEFT THUMB, DIABETIC (Left )  Patient Location: PACU  Anesthesia Type:General  Level of Consciousness: awake and drowsy  Airway & Oxygen Therapy: Patient Spontanous Breathing and Patient connected to face mask oxygen  Post-op Assessment: Report given to RN and Post -op Vital signs reviewed and stable  Post vital signs: Reviewed and stable  Last Vitals:  Vitals Value Taken Time  BP 142/71 12/02/18 1220  Temp    Pulse 89 12/02/18 1220  Resp 12 12/02/18 1220  SpO2 99 % 12/02/18 1220  Vitals shown include unvalidated device data.  Last Pain:  Vitals:   12/02/18 0913  TempSrc: Tympanic  PainSc: 0-No pain      Patients Stated Pain Goal: 0 (53/66/44 0347)  Complications: No apparent anesthesia complications

## 2018-12-02 NOTE — H&P (Signed)
Reviewed paper H+P, will be scanned into chart. No changes noted.  

## 2018-12-02 NOTE — Anesthesia Postprocedure Evaluation (Signed)
Anesthesia Post Note  Patient: Brian Kramer  Procedure(s) Performed: CARPOMETACARPAL ([place]) FUSION OF  LEFT THUMB, DIABETIC (Left )  Patient location during evaluation: PACU Anesthesia Type: General Level of consciousness: awake and alert Pain management: pain level controlled Vital Signs Assessment: post-procedure vital signs reviewed and stable Respiratory status: spontaneous breathing, nonlabored ventilation, respiratory function stable and patient connected to nasal cannula oxygen Cardiovascular status: blood pressure returned to baseline and stable Postop Assessment: no apparent nausea or vomiting Anesthetic complications: no     Last Vitals:  Vitals:   12/02/18 1340 12/02/18 1346  BP: (!) 124/53 125/64  Pulse: 82 78  Resp: 16 18  Temp:    SpO2: 93% 92%    Last Pain:  Vitals:   12/02/18 1346  TempSrc:   PainSc: 0-No pain                 Precious Haws Piscitello

## 2018-12-02 NOTE — Anesthesia Preprocedure Evaluation (Signed)
Anesthesia Evaluation  Patient identified by MRN, date of birth, ID band Patient awake    Reviewed: Allergy & Precautions, H&P , NPO status , Patient's Chart, lab work & pertinent test results  History of Anesthesia Complications Negative for: history of anesthetic complications  Airway Mallampati: III  TM Distance: >3 FB Neck ROM: full    Dental  (+) Edentulous Upper, Edentulous Lower   Pulmonary neg shortness of breath, sleep apnea , former smoker,           Cardiovascular Exercise Tolerance: Good (-) angina+ CAD, + Past MI and + Cardiac Stents  (-) DOE      Neuro/Psych  Headaches,  Neuromuscular disease negative psych ROS   GI/Hepatic Neg liver ROS, hiatal hernia, PUD, GERD  Medicated and Controlled,  Endo/Other  diabetes, Type 2  Renal/GU      Musculoskeletal  (+) Arthritis ,   Abdominal   Peds  Hematology negative hematology ROS (+)   Anesthesia Other Findings Past Medical History: No date: Anginal pain (HCC) No date: Arthritis No date: Coronary artery disease No date: Diabetes mellitus without complication (HCC)     Comment:  Took Metformin for a couple of years. Had bariatric               surgery and no longer requires it. A1-C was 5.5 in               October 2018. No date: GERD (gastroesophageal reflux disease) No date: Headache No date: History of hiatal hernia No date: Insomnia No date: Myocardial infarction ([place]) No date: Peripheral neuropathy     Comment:  neuropathy No date: PUD (peptic ulcer disease) No date: RLS (restless legs syndrome) No date: Sleep apnea     Comment:  No requires the use of CPAP  Past Surgical History: No date: arthroscpic knee 2005; Right No date: Cardiac Stent 1997 No date: Cardiac Stent 2000 04/08/2018: CARPOMETACARPAL ([place] FUSION OF THUMB; Right     Comment:  Procedure: CARPOMETACARPAL [facility]) FUSION OF THUMB;                Surgeon: Hessie Knows, MD;   Location: [facility] ORS;                Service: Orthopedics;  Laterality: Right; No date: COLONOSCOPY 05/19/2018: COLONOSCOPY WITH PROPOFOL; N/A     Comment:  Procedure: COLONOSCOPY WITH PROPOFOL;  Surgeon: Toledo,               Benay Pike, MD;  Location: [facility];  Service:               Gastroenterology;  Laterality: N/A; No date: CORONARY ANGIOPLASTY 2015: EYE SURGERY     Comment:  Bilateral cataract 2017: Gastric Sleeve Bariatric Surgery 01/15/2018: HAMMER TOE SURGERY; Right     Comment:  Procedure: HAMMER TOE CORRECTION-2ND & 3RD;  Surgeon:               Albertine Patricia, DPM;  Location: [facility];  Service:               Podiatry;  Laterality: Right; No date: HERNIA REPAIR; Bilateral     Comment:  inguinal 2007: JOINT REPLACEMENT     Comment:  Left knee 2015: JOINT REPLACEMENT     Comment:  Right Knee No date: left knee arthroscopy 2006; Left No date: Right Heel Spur Resection 2012 No date: Right shoulder AC separation surgery in 1995; Right 2017: Stem Cells to left heel No date:  TONSILLECTOMY 2017: UPPER GI ENDOSCOPY No date: Vasectomy 1970's 01/15/2018: WEIL OSTEOTOMY; Right     Comment:  Procedure: [place];  Surgeon: Albertine Patricia, DPM;  Location: [facility];  Service: Podiatry;                Laterality: Right;  BMI    Body Mass Index: 42.70 kg/m      Reproductive/Obstetrics negative OB ROS                             Anesthesia Physical Anesthesia Plan  ASA: III  Anesthesia Plan: General LMA   Post-op Pain Management:    Induction: Intravenous  PONV Risk Score and Plan: Dexamethasone, Ondansetron, Midazolam and Treatment may vary due to age or medical condition  Airway Management Planned: LMA  Additional Equipment:   Intra-op Plan:   Post-operative Plan: Extubation in OR  Informed Consent: I have reviewed the patients History and Physical, chart, labs and discussed the procedure including the  risks, benefits and alternatives for the proposed anesthesia with the patient or authorized representative who has indicated his/her understanding and acceptance.     Dental Advisory Given  Plan Discussed with: Anesthesiologist, CRNA and Surgeon  Anesthesia Plan Comments: (Patient consented for risks of anesthesia including but not limited to:  - adverse reactions to medications - damage to teeth, lips or other oral mucosa - sore throat or hoarseness - Damage to heart, brain, lungs or loss of life  Patient voiced understanding.)        Anesthesia Quick Evaluation

## 2018-12-02 NOTE — Discharge Instructions (Addendum)
Keep arm elevated is much as possible over the next few days.  Okay to work on finger motion but not the thumb.  Keep splint clean and dry.  Take pain medicine as directed. AMBULATORY SURGERY  DISCHARGE INSTRUCTIONS   1) The drugs that you were given will stay in your system until tomorrow so for the next 24 hours you should not:  A) Drive an automobile B) Make any legal decisions C) Drink any alcoholic beverage   2) You may resume regular meals tomorrow.  Today it is better to start with liquids and gradually work up to solid foods.  You may eat anything you prefer, but it is better to start with liquids, then soup and crackers, and gradually work up to solid foods.   3) Please notify your doctor immediately if you have any unusual bleeding, trouble breathing, redness and pain at the surgery site, drainage, fever, or pain not relieved by medication.    4) Additional Instructions:        Please contact your physician with any problems or Same Day Surgery at [phone], Monday through Friday 6 am to 4 pm, or [place] at [facility] number at [phone].

## 2018-12-02 NOTE — Anesthesia Post-op Follow-up Note (Signed)
Anesthesia QCDR form completed.        

## 2018-12-02 NOTE — Anesthesia Procedure Notes (Signed)
Procedure Name: LMA Insertion Date/Time: 12/02/2018 11:00 AM Performed by: Eben Burow, CRNA Pre-anesthesia Checklist: Patient identified, Emergency Drugs available, Suction available and Patient being monitored Patient Re-evaluated:Patient Re-evaluated prior to induction Oxygen Delivery Method: Circle system utilized Preoxygenation: Pre-oxygenation with 100% oxygen Induction Type: IV induction Ventilation: Mask ventilation without difficulty LMA: LMA inserted LMA Size: 5.0 Number of attempts: 1 Placement Confirmation: positive ETCO2 and breath sounds checked- equal and bilateral Tube secured with: Tape Dental Injury: Teeth and Oropharynx as per pre-operative assessment

## 2018-12-03 ENCOUNTER — Encounter: Payer: Self-pay | Admitting: Orthopedic Surgery

## 2018-12-29 ENCOUNTER — Other Ambulatory Visit: Payer: Self-pay

## 2018-12-29 ENCOUNTER — Encounter: Payer: Self-pay | Admitting: Occupational Therapy

## 2018-12-29 ENCOUNTER — Ambulatory Visit: Payer: Medicare Other | Attending: Orthopedic Surgery | Admitting: Occupational Therapy

## 2018-12-29 DIAGNOSIS — L905 Scar conditions and fibrosis of skin: Secondary | ICD-10-CM | POA: Diagnosis present

## 2018-12-29 DIAGNOSIS — M25632 Stiffness of left wrist, not elsewhere classified: Secondary | ICD-10-CM | POA: Diagnosis present

## 2018-12-29 DIAGNOSIS — M6281 Muscle weakness (generalized): Secondary | ICD-10-CM | POA: Diagnosis present

## 2018-12-29 DIAGNOSIS — M25642 Stiffness of left hand, not elsewhere classified: Secondary | ICD-10-CM | POA: Diagnosis present

## 2018-12-29 DIAGNOSIS — M79642 Pain in left hand: Secondary | ICD-10-CM | POA: Diagnosis not present

## 2018-12-29 NOTE — Therapy (Signed)
[facility] PHYSICAL AND SPORTS MEDICINE [address] [place], [place], [address] Phone: [phone]   Fax:  [phone]  [facility]  Patient Details  Name: Brian Kramer MRN: [id number] Date of Birth: [birth year] Referring Provider (OT): Dr Rosita KeaMenz   Encounter Date: 12/29/2018  OT End of Session - 12/29/18 1357    Visit Number  1    Number of Visits  12    Date for OT Re-Evaluation  02/22/19    OT Start Time  1300    OT Stop Time  1338    OT Time Calculation (min)  38 min    Activity Tolerance  Patient tolerated treatment well    Behavior During Therapy  [facility] for tasks assessed/performed       Past Medical History:  Diagnosis Date  . Anginal pain (HCC)   . Arthritis   . Coronary artery disease   . Diabetes mellitus without complication (HCC)    Took Metformin for a couple of years. Had bariatric surgery and no longer requires it. A1-C was 5.5 in October 2018.  [place] Kitchen. GERD (gastroesophageal reflux disease)   . Headache   . History of hiatal hernia   . Insomnia   . Myocardial infarction (HCC)   . Peripheral neuropathy    neuropathy  . PUD (peptic ulcer disease)   . RLS (restless legs syndrome)   . Sleep apnea    No requires the use of CPAP    Past Surgical History:  Procedure Laterality Date  . arthroscpic knee 2005 Right   . Cardiac Stent 1997    . Cardiac Stent 2000    . CARPOMETACARPAL ([facility]) FUSION OF THUMB Right 04/08/2018   Procedure: CARPOMETACARPAL [facility]) FUSION OF THUMB;  Surgeon: Kennedy BuckerMenz, Michael, MD;  Location: [facility];  Service: Orthopedics;  Laterality: Right;  . CARPOMETACARPAL ([facility]) FUSION OF THUMB Left 12/02/2018   Procedure: CARPOMETACARPAL ([facility]) FUSION OF  LEFT THUMB, DIABETIC;  Surgeon: Kennedy BuckerMenz, Michael, MD;  Location: [facility];  Service: Orthopedics;  Laterality: Left;  . COLONOSCOPY    . COLONOSCOPY WITH PROPOFOL N/A 05/19/2018   Procedure: COLONOSCOPY WITH PROPOFOL;  Surgeon: Toledo, Boykin Nearingeodoro K, MD;   Location: [facility];  Service: Gastroenterology;  Laterality: N/A;  . CORONARY ANGIOPLASTY    . EYE SURGERY  2015   Bilateral cataract  . Gastric Sleeve Bariatric Surgery  2017  . HAMMER TOE SURGERY Right 01/15/2018   Procedure: HAMMER TOE CORRECTION-2ND & 3RD;  Surgeon: Recardo Evangelistroxler, Matthew, DPM;  Location: [facility];  Service: Podiatry;  Laterality: Right;  . HERNIA REPAIR Bilateral    inguinal  . JOINT REPLACEMENT  2007   Left knee  . JOINT REPLACEMENT  2015   Right Knee  . left knee arthroscopy 2006 Left   . Right Heel Spur Resection 2012    . Right shoulder AC separation surgery in 1995 Right   . Stem Cells to left heel  2017  . TONSILLECTOMY    . UPPER GI ENDOSCOPY  2017  . Vasectomy 1970's    . WEIL OSTEOTOMY Right 01/15/2018   Procedure: WEIL OSTEOTOMY-2ND & 3RD;  Surgeon: Recardo Evangelistroxler, Matthew, DPM;  Location: [facility];  Service: Podiatry;  Laterality: Right;    There were no vitals filed for this visit.  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  Yes    Pain Score  2     Pain Location  --   L thumb   Pain Orientation  Left    Pain Descriptors / Indicators  Aching    Pain Type  Surgical pain    Pain Onset  1 to 4 weeks ago    Pain Frequency  Constant        [facility] OT Assessment - 12/29/18 0001      Assessment   Medical Diagnosis  L CMC arthroplasty    Referring Provider (OT)  Dr Rosita KeaMenz    Onset Date/Surgical Date  12/02/18    Hand Dominance  Right    Next MD Visit  August 5th     Prior Therapy  --   Nov 19 thru Jan 2020  R thumb      Precautions   Required Braces or Orthoses  --   L prefab thumb spica all the time when out and about and sle      Home  Environment   Lives With  Spouse      Prior Function   Vocation  Retired    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, cook  and some house work      AROM   Left Wrist Extension  55 Degrees    Left Wrist Flexion  85 Degrees    Left Wrist Radial Deviation  22 Degrees    Left Wrist Ulnar Deviation  26 Degrees      Left Hand AROM   L Thumb MCP 0-60  30 Degrees    L Thumb IP 0-80  70 Degrees    L Thumb Radial ADduction/ABduction 0-55  40    L Thumb Palmar ADduction/ABduction 0-45  45    L Thumb Opposition to Index  --   Opposition to base of 5th    L Index  MCP 0-90  70 Degrees    L Index PIP 0-100  100 Degrees    L Long  MCP 0-90  80 Degrees    L Long PIP 0-100  100 Degrees    L Ring  MCP 0-90  85 Degrees    L Ring PIP 0-100  100 Degrees    L Little  MCP 0-90  85 Degrees    L Little PIP 0-100  100 Degrees             contrast done with pt to decrease pain and increase AROM  Review HEP and hand out provided :    2-3 x day  Contrast  Tendon glides  AROM for thumb PA ,and RA  10 reps Opposition to all digits - 5 reps each and slide down 5th digit  Wrist flexion and extention AROM 10 reps  slight pull or stretch - pain under 2/10         OT Education - 12/29/18 1357    Education Details  findings of eval and HEP    Person(s) Educated  Patient    Methods  Explanation;Demonstration;Handout    Comprehension  Verbalized understanding;Returned demonstration       OT Short Term Goals - 12/29/18 1402      OT SHORT TERM GOAL #  1   Title  Pt to be ind in HEP to decrease scar tissue, edema and pain and increase AROM to use L hand in 50% or more of ADL's    Baseline  Prefab thumb spica 90% of time on - using hand only in 33% of ADL's    Time  3    Period  Weeks    Status  New    Target Date  01/19/19      OT SHORT TERM GOAL #2   Title  Pain on PRWHE improve with more than 20 points     Baseline  Pain on PRWHE at eval 34/50    Time  3    Period  Weeks     Status  New    Target Date  01/19/19      OT SHORT TERM GOAL #3   Title  R hand digits flexion improve for pt to touch palm without increase syptoms     Baseline  Flexion of 2nd 75 and 3rd 80 with increase pain or pull over MC's    Time  2    Period  Weeks    Status  New    Target Date  01/12/19        OT Long Term Goals - 12/29/18 1404      OT LONG TERM GOAL #1   Title  Lthumb AROM improve in PA and RA and flexion to grap empy cup and retrieve 1 inch object out of palm withou increase symptoms    Baseline  see flowsheet    Time  5    Period  Weeks    Status  New    Target Date  02/02/19      OT LONG TERM GOAL #2   Title  L  wrist AROM improve for pt to use R hand in more than 50%  bath L side of body, apply deodorant and dry with towel    Baseline  see flowsheet    Time  4    Period  Weeks    Status  New    Target Date  01/26/19      OT LONG TERM GOAL #3   Title  L Grip strength and prehension improve to more than 50%  compare to R  hand to do buttons, cut food, carry more than 5 lbs without increase symptoms    Baseline  NT - pt 3 1/2 wks s/p    Time  8    Period  Weeks    Status  New    Target Date  02/22/19      OT LONG TERM GOAL #4   Title  Function on PRWHE improve with more than 20 points     Baseline  function score on PRWHE 33.5/50    Time  8    Period  Weeks    Status  New    Target Date  02/22/19            Plan - 12/29/18 1358    Clinical Impression Statement  Pt present at eval 3 1/2 wks s/p L CMC arthroplasty - pt report pain between 2-8/10 pain - thumb spica on more than 90% of time - pt show  increase pain and scar tissue - decrease AROM in wrist and thumb - slight pull during fisting over 2nd MC - decrease strength - limiting her functional use of L hand in ADL's - pt can benefit from OT services  OT Occupational Profile and History  Problem Focused Assessment - Including review of records relating to presenting problem    Occupational  performance deficits (Please refer to evaluation for details):  ADL's;IADL's;Play;Leisure;Social Participation;Rest and Sleep    Body Structure / Function / Physical Skills  ADL;Flexibility;ROM;UE functional use;Scar mobility;FMC;Edema;Pain;Strength;IADL    Rehab Potential  Good    Clinical Decision Making  Limited treatment options, no task modification necessary    Comorbidities Affecting Occupational Performance:  None    Modification or Assistance to Complete Evaluation   No modification of tasks or assist necessary to complete eval    OT Frequency  2x / week   decrease later to 1 x wk as progress   OT Duration  8 weeks    OT Treatment/Interventions  Self-care/ADL training;Therapeutic exercise;Patient/family education;Splinting;Paraffin;Fluidtherapy;Contrast Bath;Manual Therapy;Passive range of motion;Functional Mobility Training;Scar mobilization    Plan  assess progress with HEP and change  as needed    OT Home Exercise Plan  see pt instruction    Consulted and Agree with Plan of Care  Patient       Patient will benefit from skilled therapeutic intervention in order to improve the following deficits and impairments:   Body Structure / Function / Physical Skills: ADL, Flexibility, ROM, UE functional use, Scar mobility, FMC, Edema, Pain, Strength, IADL       Visit Diagnosis: 1. Pain in left hand   2. Stiffness of left hand, not elsewhere classified   3. Stiffness of left wrist, not elsewhere classified   4. Muscle weakness (generalized)   5. Scar condition and fibrosis of skin       Problem List There are no active problems to display for this patient.   Brian Kramer OTR/L,CLT 12/29/2018, 2:09 PM  [facility]OR[facility]IC[facility] [address] [place], [address] Phone: [phone]   Fax:  [phone]  Name: Brian Kramer MRN: [id number] Date of Birth: Jan 27, 1946

## 2018-12-29 NOTE — Patient Instructions (Signed)
2-3 x day  Contrast  Tendon glides  AROM for thumb PA ,and RA  10 reps Opposition to all digits - 5 reps each and slide down 5th digit  Wrist flexion and extention AROM 10 reps  slight pull or stretch - pain under 2/10

## 2019-01-05 ENCOUNTER — Ambulatory Visit: Payer: Medicare Other | Admitting: Occupational Therapy

## 2019-01-05 ENCOUNTER — Other Ambulatory Visit: Payer: Self-pay

## 2019-01-05 DIAGNOSIS — M25632 Stiffness of left wrist, not elsewhere classified: Secondary | ICD-10-CM

## 2019-01-05 DIAGNOSIS — L905 Scar conditions and fibrosis of skin: Secondary | ICD-10-CM

## 2019-01-05 DIAGNOSIS — M79642 Pain in left hand: Secondary | ICD-10-CM

## 2019-01-05 DIAGNOSIS — M6281 Muscle weakness (generalized): Secondary | ICD-10-CM

## 2019-01-05 DIAGNOSIS — M25642 Stiffness of left hand, not elsewhere classified: Secondary | ICD-10-CM

## 2019-01-05 NOTE — Patient Instructions (Signed)
Same HEP - but add circular AROM both directions to thumb 2 x10  Increase to 3 sets in 3 days  And Thumb CMC RA with IP keep in flexion  1 x 10 reps  Other AROM for wrist and thumb can be 2 x10  And increase to 3 sets of 10 in 3 days  Can do wrist flexion , extention -with hand in loose fist too

## 2019-01-05 NOTE — Therapy (Signed)
[facility] PH[facility]ICINE [address] [place], [place], [address] Phone: [phone]   Fax:  [phone]  Occupational Therapy Treatment  Patient Details  Name: Brian Kramer MRN: [id number] Date of Birth: [birth year] Referring Provider (OT): Dr Rosita KeaMenz   Encounter Date: 01/05/2019  OT End of Session - 01/05/19 1436    Visit Number  2    Number of Visits  12    Date for OT Re-Evaluation  02/22/19    OT Start Time  1358    OT Stop Time  1430    OT Time Calculation (min)  32 min    Activity Tolerance  Patient tolerated treatment well    Behavior During Therapy  [facility]/per[facility]       Past Medical History:  Diagnosis Date  . Anginal pain (HCC)   . Arthritis   . Coronary artery disease   . Diabetes mellitus without complication (HCC)    Took Metformin for a couple of years. Had bariatric surgery and no longer requires it. A1-C was 5.5 in October 2018.  Marland Kitchen. GERD (gastroesophageal reflux disease)   . Headache   . History of hiatal hernia   . Insomnia   . Myocardial infarction (HCC)   . Peripheral neuropathy    neuropathy  . PUD (peptic ulcer disease)   . RLS (restless legs syndrome)   . Sleep apnea    No requires the use of CPAP    Past Surgical History:  Procedure Laterality Date  . arthroscpic knee 2005 Right   . Cardiac Stent 1997    . Cardiac Stent 2000    . CARPOMETACARPAL ([facility]) FUSION OF THUMB Right 04/08/2018   Procedure: CARPOMETACARPAL [facility](CMC) FUSION OF THUMB;  Surgeon: Kennedy BuckerMenz, Michael, MD;  Location: [facility] ORS;  Service: Orthopedics;  Laterality: Right;  . CARPOMETACARPAL ([facility]) FUSION OF THUMB Left 12/02/2018   Procedure: CARPOMETACARPAL ([facility]) FUSION OF  LEFT THUMB, DIABETIC;  Surgeon: Kennedy BuckerMenz, Michael, MD;  Location: [facility];  Service: Orthopedics;  Laterality: Left;  . COLONOSCOPY    . COLONOSCOPY WITH PROPOFOL N/A 05/19/2018   Procedure: COLONOSCOPY WITH PROPOFOL;  Surgeon: Toledo, Boykin Nearingeodoro K, MD;   Location: [facility];  Service: Gastroenterology;  Laterality: N/A;  . CORONARY ANGIOPLASTY    . EYE SURGERY  2015   Bilateral cataract  . Gastric Sleeve Bariatric Surgery  2017  . HAMMER TOE SURGERY Right 01/15/2018   Procedure: HAMMER TOE CORRECTION-2ND & 3RD;  Surgeon: Recardo Evangelistroxler, Matthew, DPM;  Location: [facility];  Service: Podiatry;  Laterality: Right;  . HERNIA REPAIR Bilateral    inguinal  . JOINT REPLACEMENT  2007   Left knee  . JOINT REPLACEMENT  2015   Right Knee  . left knee arthroscopy 2006 Left   . Right Heel Spur Resection 2012    . Right shoulder AC separation surgery in 1995 Right   . Stem Cells to left heel  2017  . TONSILLECTOMY    . UPPER GI ENDOSCOPY  2017  . Vasectomy 1970's    . WEIL OSTEOTOMY Right 01/15/2018   Procedure: WEIL OSTEOTOMY-2ND & 3RD;  Surgeon: Recardo Evangelistroxler, Matthew, DPM;  Location: [facility];  Service: Podiatry;  Laterality: Right;    There were no vitals filed for this visit.  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  Yes    Pain Score  2     Pain Location  Finger (Comment which one)   Thumb   Pain Orientation  Left    Pain Descriptors / Indicators  Aching    Pain Type  Surgical pain         [facility] OT Assessment - 01/05/19 0001      AROM   Left Wrist Extension  67 Degrees    Left Wrist Flexion  80 Degrees      Left Hand AROM   L Thumb MCP 0-60  30 Degrees    L Thumb IP 0-80  70 Degrees    L Thumb Radial ADduction/ABduction 0-55  45    L Thumb Palmar ADduction/ABduction 0-45  60       assess AROM of thumb - progressing well -and pain less than  2/10 this date with AROM  sterri strips taken off - close but thin skin proximal - pt can put bandaid over  Opposition to base of 5th  Pt to slow down with AROM of thumb         OT Treatments/Exercises (OP) - 01/05/19 0001      LUE Contrast Bath   Time  9 minutes    Comments  prior to soft tissue and AROM       Thumb PA and RA AROM 10 reps  add RA AROM to pt keeping IP flex -and do RA 10 reps - on piece of paper slide  Opposition to all digits and slide down to 5th   Circular thumb AROM add - both directions - 2 x 10  Wrist AROM to cont same -but also to do loose fist - for flexion and extention  2 x10  Can increase to 2-3 sets of AROM in 3-6 days if pain free         OT Education - 01/05/19 1436    Education Details  changes to HEP    Person(s) Educated  Patient    Methods  Explanation;Demonstration;Handout    Comprehension  Verbalized understanding;Returned demonstration       OT Short Term Goals - 12/29/18 1402      OT SHORT TERM GOAL #1   Title  Pt to be ind in HEP to decrease scar tissue, edema and pain and increase AROM to use L hand in 50% or more of ADL's    Baseline  Prefab thumb spica 90% of time on - using hand only in 33% of ADL's    Time  3    Period  Weeks    Status  New    Target Date  01/19/19      OT SHORT TERM GOAL #2   Title  Pain on PRWHE improve with more than 20 points     Baseline  Pain on PRWHE at eval 34/50    Time  3    Period  Weeks    Status  New    Target Date  01/19/19      OT SHORT TERM GOAL #3   Title  R hand digits flexion improve for pt  to touch palm without increase syptoms     Baseline  Flexion of 2nd 75 and 3rd 80 with increase pain or pull over MC's    Time  2    Period  Weeks    Status  New    Target Date  01/12/19        OT Long Term Goals - 12/29/18 1404      OT LONG TERM GOAL #1   Title  Lthumb AROM improve in PA and RA and flexion to grap empy cup and retrieve 1 inch object out of palm withou increase  symptoms    Baseline  see flowsheet    Time  5    Period  Weeks    Status  New    Target Date  02/02/19      OT LONG TERM GOAL #2   Title  L  wrist AROM improve for pt to use R hand in more than 50%  bath L side of body, apply deodorant and dry with towel    Baseline  see flowsheet    Time  4    Period  Weeks    Status  New    Target Date  01/26/19      OT LONG TERM GOAL #3   Title  L Grip strength and prehension improve to more than 50%  compare to R  hand to do buttons, cut food, carry more than 5 lbs without increase symptoms    Baseline  NT - pt 3 1/2 wks s/p    Time  8    Period  Weeks    Status  New    Target Date  02/22/19      OT LONG TERM GOAL #4   Title  Function on PRWHE improve with more than 20 points     Baseline  function score on PRWHE 33.5/50    Time  8    Period  Weeks    Status  New    Target Date  02/22/19            Plan - 01/05/19 1437    Clinical Impression Statement  Pt is 4 1/2 wks s/p L CMC arthroplasty - pt pain is 1- 2/10 with AROM - progress well with thumb and wrist AROM since last week - add some circular and pt to slow down and focus on CMC AROM during PA and RA - and circular    OT Occupational Profile and History  Problem Focused Assessment - Including review of records relating to presenting problem    Occupational performance deficits (Please refer to evaluation for details):  ADL's;IADL's;Play;Leisure;Social Participation;Rest and Sleep    Body Structure / Function / Physical Skills  ADL;Flexibility;ROM;UE functional use;Scar mobility;FMC;Edema;Pain;Strength;IADL    Rehab Potential  Good    Clinical Decision Making  Limited treatment options, no task modification necessary    Comorbidities Affecting Occupational Performance:  None    Modification or Assistance to Complete Evaluation   No modification of tasks or assist necessary to complete eval    OT Frequency  2x / week    OT Duration  8 weeks    OT Treatment/Interventions   Self-care/ADL training;Therapeutic exercise;Patient/family education;Splinting;Paraffin;Fluidtherapy;Contrast Bath;Manual Therapy;Passive range of motion;Functional Mobility Training;Scar mobilization    Plan  assess progress with HEP andi if can add isometric for thumb and wrist    OT Home Exercise Plan  see pt instruction    Consulted and Agree with Plan of Care  Patient  Patient will benefit from skilled therapeutic intervention in order to improve the following deficits and impairments:   Body Structure / Function / Physical Skills: ADL, Flexibility, ROM, UE functional use, Scar mobility, FMC, Edema, Pain, Strength, IADL       Visit Diagnosis: 1. Stiffness of left hand, not elsewhere classified   2. Pain in left hand   3. Stiffness of left wrist, not elsewhere classified   4. Muscle weakness (generalized)   5. Scar condition and fibrosis of skin       Problem List There are no active problems to display for this patient.   Oletta CohnuPreez, Aleck Locklin OTRL,CLT 01/05/2019, 2:41 PM  [facility] [address] [place], [place], [address] Phone: [phone]   Fax:  [phone]  Name: Brian Kramer MRN: [id number] Date of Birth: 11/11/1945

## 2019-01-11 ENCOUNTER — Other Ambulatory Visit: Payer: Self-pay

## 2019-01-11 ENCOUNTER — Ambulatory Visit: Payer: Medicare Other | Attending: Orthopedic Surgery | Admitting: Occupational Therapy

## 2019-01-11 DIAGNOSIS — L905 Scar conditions and fibrosis of skin: Secondary | ICD-10-CM | POA: Diagnosis present

## 2019-01-11 DIAGNOSIS — M79642 Pain in left hand: Secondary | ICD-10-CM | POA: Diagnosis present

## 2019-01-11 DIAGNOSIS — M25642 Stiffness of left hand, not elsewhere classified: Secondary | ICD-10-CM | POA: Diagnosis present

## 2019-01-11 DIAGNOSIS — M6281 Muscle weakness (generalized): Secondary | ICD-10-CM

## 2019-01-11 DIAGNOSIS — M25632 Stiffness of left wrist, not elsewhere classified: Secondary | ICD-10-CM | POA: Diagnosis present

## 2019-01-11 NOTE — Patient Instructions (Signed)
Add AAROM for wrist flexion and extention  10 reps slight pull   Isometric strengthening for wrist in all planes -10 reps  And isometric strengthening for thumb in all planes - but pt need to place in correct position at 45 degrees ABD thumb CMC 2 x 10 reps up and down , in and out  Slight pull

## 2019-01-11 NOTE — Therapy (Signed)
[facility] PHYSICAL AND SPORTS MEDICINE [address], [place], [address] Phone: [phone]   Fax:  [phone]  Occupational Therapy Treatment  Patient Details  Name: Brian Kramer MRN: [id number] Date of Birth: 08-Jul-1945 Referring Provider (OT): Dr Rudene Christians   Encounter Date: 01/11/2019  OT End of Session - 01/11/19 0808    Visit Number  3    Number of Visits  12    Date for OT Re-Evaluation  02/22/19    OT Start Time  0810    OT Stop Time  0845    OT Time Calculation (min)  35 min    Activity Tolerance  Patient tolerated treatment well    Behavior During Therapy  [facility] for tasks assessed/performed       Past Medical History:  Diagnosis Date  . Anginal pain ([place])   . Arthritis   . Coronary artery disease   . Diabetes mellitus without complication (HCC)    Took Metformin for a couple of years. Had bariatric surgery and no longer requires it. A1-C was 5.5 in October 2018.  Marland Kitchen GERD (gastroesophageal reflux disease)   . Headache   . History of hiatal hernia   . Insomnia   . Myocardial infarction ([place])   . Peripheral neuropathy    neuropathy  . PUD (peptic ulcer disease)   . RLS (restless legs syndrome)   . Sleep apnea    No requires the use of CPAP    Past Surgical History:  Procedure Laterality Date  . arthroscpic knee 2005 Right   . Cardiac Stent 1997    . Cardiac Stent 2000    . CARPOMETACARPAL ([place]) FUSION OF THUMB Right 04/08/2018   Procedure: CARPOMETACARPAL [facility]) FUSION OF THUMB;  Surgeon: Hessie Knows, MD;  Location: [facility];  Service: Orthopedics;  Laterality: Right;  . CARPOMETACARPAL ([place]) FUSION OF THUMB Left 12/02/2018   Procedure: CARPOMETACARPAL ([place]) FUSION OF  LEFT THUMB, DIABETIC;  Surgeon: Hessie Knows, MD;  Location: [facility];  Service: Orthopedics;  Laterality: Left;  . COLONOSCOPY    . COLONOSCOPY WITH PROPOFOL N/A 05/19/2018   Procedure: COLONOSCOPY WITH PROPOFOL;  Surgeon: Toledo, Benay Pike, MD;   Location: [facility];  Service: Gastroenterology;  Laterality: N/A;  . CORONARY ANGIOPLASTY    . EYE SURGERY  2015   Bilateral cataract  . Gastric Sleeve Bariatric Surgery  2017  . HAMMER TOE SURGERY Right 01/15/2018   Procedure: HAMMER TOE CORRECTION-2ND & 3RD;  Surgeon: Albertine Patricia, DPM;  Location: [facility];  Service: Podiatry;  Laterality: Right;  . HERNIA REPAIR Bilateral    inguinal  . JOINT REPLACEMENT  2007   Left knee  . JOINT REPLACEMENT  2015   Right Knee  . left knee arthroscopy 2006 Left   . Right Heel Spur Resection 2012    . Right shoulder AC separation surgery in 1995 Right   . Stem Cells to left heel  2017  . TONSILLECTOMY    . UPPER GI ENDOSCOPY  2017  . Vasectomy 1970's    . WEIL OSTEOTOMY Right 01/15/2018   Procedure: [place];  Surgeon: Albertine Patricia, DPM;  Location: [facility];  Service: Podiatry;  Laterality: Right;    There were no vitals filed for this visit.  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  No/denies       assess AROM of thumb - progressing well -and pain less than 2/10 this date with AROM  Had bandaid still on incision -remove - pt to keep open to dry - skin close  Opposition to base of 5th  Pt to slow down with AROM of thumb                OT Treatments/Exercises (OP) - 01/11/19 0001      LUE Contrast Bath   Time  9 minutes    Comments  prior to soft tissue and ROM         Thumb PA and RA AROM 10 reps  Opposition to all digits and slide down to 5th   Circular thumb AROM - both directions - 2 x 10  And isometric strengthening for thumb in all planes - but pt need to place in correct position at  45 degrees ABD thumb CMC 2 x 10 reps up and down , in and out  Slight pull   Add AAROM for wrist flexion and extention  10 reps slight pull    Wrist AROM to cont same -but also to do loose fist - for flexion and extention  2 x10  Isometric strengthening for wrist in all planes -10 reps         OT Education - 01/11/19 0807    Education Details  add isometric strength to thumb and wrist    Person(s) Educated  Patient    Methods  Explanation;Demonstration;Handout    Comprehension  Verbalized understanding;Returned demonstration       OT Short Term Goals - 12/29/18 1402      OT SHORT TERM GOAL #1   Title  Pt to be ind in HEP to decrease scar tissue, edema and pain and increase AROM to use L hand in 50% or more of ADL's    Baseline  Prefab thumb spica 90% of time on - using hand only in 33% of ADL's    Time  3    Period  Weeks    Status  New    Target Date  01/19/19      OT SHORT TERM GOAL #2   Title  Pain on PRWHE improve with more than 20 points     Baseline  Pain on PRWHE at eval 34/50    Time  3    Period  Weeks    Status  New    Target Date  01/19/19      OT SHORT TERM GOAL #3   Title  R hand digits flexion improve for pt to touch palm without increase syptoms     Baseline  Flexion of 2nd 75 and 3rd 80 with increase pain or pull over MC's    Time  2    Period  Weeks    Status  New    Target Date  01/12/19        OT Long Term Goals - 12/29/18 1404      OT LONG TERM GOAL #1   Title  Lthumb AROM improve in PA and RA and flexion to grap empy cup and retrieve 1 inch object out of palm withou increase symptoms  Baseline  see flowsheet    Time  5    Period  Weeks    Status  New    Target Date  02/02/19      OT LONG TERM GOAL #2   Title  L  wrist AROM improve for pt to use R hand in more than 50%  bath L side of body, apply deodorant and dry with towel    Baseline  see flowsheet    Time  4    Period  Weeks    Status  New    Target Date  01/26/19       OT LONG TERM GOAL #3   Title  L Grip strength and prehension improve to more than 50%  compare to R  hand to do buttons, cut food, carry more than 5 lbs without increase symptoms    Baseline  NT - pt 3 1/2 wks s/p    Time  8    Period  Weeks    Status  New    Target Date  02/22/19      OT LONG TERM GOAL #4   Title  Function on PRWHE improve with more than 20 points     Baseline  function score on PRWHE 33.5/50    Time  8    Period  Weeks    Status  New    Target Date  02/22/19            Plan - 01/11/19 [id number]    Clinical Impression Statement  Pt is 5 wks s/p L CMC arthroplasty - pt no pain in splint - do take if of at home sitting - pt come in with bandaid - to to remove and let it dry little scab - pt progressing well and able this date to start isometric strenghening for thumb and wrist - and will wean pt out of splint in the next 2 wks    OT Occupational Profile and History  Problem Focused Assessment - Including review of records relating to presenting problem    Occupational performance deficits (Please refer to evaluation for details):  ADL's;IADL's;Play;Leisure;Social Participation;Rest and Sleep    Body Structure / Function / Physical Skills  ADL;Flexibility;ROM;UE functional use;Scar mobility;FMC;Edema;Pain;Strength;IADL    Rehab Potential  Good    Clinical Decision Making  Limited treatment options, no task modification necessary    Comorbidities Affecting Occupational Performance:  None    Modification or Assistance to Complete Evaluation   No modification of tasks or assist necessary to complete eval    OT Frequency  2x / week    OT Duration  8 weeks    OT Treatment/Interventions  Self-care/ADL training;Therapeutic exercise;Patient/family education;Splinting;Paraffin;Fluidtherapy;Contrast Bath;Manual Therapy;Passive range of motion;Functional Mobility Training;Scar mobilization    Plan  assess progress with HEP andi tolerate isometric for thumb and wrist    OT Home  Exercise Plan  see pt instruction    Consulted and Agree with Plan of Care  Patient       Patient will benefit from skilled therapeutic intervention in order to improve the following deficits and impairments:   Body Structure / Function / Physical Skills: ADL, Flexibility, ROM, UE functional use, Scar mobility, FMC, Edema, Pain, Strength, IADL       Visit Diagnosis: 1. Stiffness of left hand, not elsewhere classified   2. Pain in left hand   3. Stiffness of left wrist, not elsewhere classified   4. Muscle weakness (generalized)   5. Scar condition and fibrosis  of skin       Problem List There are no active problems to display for this patient.   Oletta CohnuPreez, Chad Tiznado OTR/L,CLT 01/11/2019, 8:52 AM  [facility] [address] , [place], [address] Phone: [phone]   Fax:  [phone]  Name: Brian Kramer MRN: [id number] Date of Birth: 03-Aug-1945

## 2019-01-13 ENCOUNTER — Other Ambulatory Visit: Payer: Self-pay

## 2019-01-13 ENCOUNTER — Ambulatory Visit: Payer: Medicare Other | Admitting: Occupational Therapy

## 2019-01-13 DIAGNOSIS — M25632 Stiffness of left wrist, not elsewhere classified: Secondary | ICD-10-CM

## 2019-01-13 DIAGNOSIS — M25642 Stiffness of left hand, not elsewhere classified: Secondary | ICD-10-CM | POA: Diagnosis not present

## 2019-01-13 DIAGNOSIS — L905 Scar conditions and fibrosis of skin: Secondary | ICD-10-CM

## 2019-01-13 DIAGNOSIS — M6281 Muscle weakness (generalized): Secondary | ICD-10-CM

## 2019-01-13 DIAGNOSIS — M79642 Pain in left hand: Secondary | ICD-10-CM

## 2019-01-13 NOTE — Therapy (Signed)
[facility]ICINE [address] , [place], [address] Phone: [phone]   Fax:  [phone]  Occupational Therapy Treatment  Patient Details  Name: Brian CoddingClinton Rhein MRN: [id number] Date of Birth: 02/25/1946 Referring Provider (OT): Dr Rosita KeaMenz   Encounter Date: 01/13/2019  OT End of Session - 01/13/19 0839    Visit Number  4    Number of Visits  12    Date for OT Re-Evaluation  02/22/19    OT Start Time  0844    OT Stop Time  0918    OT Time Calculation (min)  34 min    Activity Tolerance  Patient tolerated treatment well    Behavior During Therapy  [facility] for tasks assessed/performed       Past Medical History:  Diagnosis Date  . Anginal pain (HCC)   . Arthritis   . Coronary artery disease   . Diabetes mellitus without complication (HCC)    Took Metformin for a couple of years. Had bariatric surgery and no longer requires it. A1-C was 5.5 in October 2018.  Marland Kitchen. GERD (gastroesophageal reflux disease)   . Headache   . History of hiatal hernia   . Insomnia   . Myocardial infarction (HCC)   . Peripheral neuropathy    neuropathy  . PUD (peptic ulcer disease)   . RLS (restless legs syndrome)   . Sleep apnea    No requires the use of CPAP    Past Surgical History:  Procedure Laterality Date  . arthroscpic knee 2005 Right   . Cardiac Stent 1997    . Cardiac Stent 2000    . CARPOMETACARPAL ([facility]) FUSION OF THUMB Right 04/08/2018   Procedure: CARPOMETACARPAL [facility]) FUSION OF THUMB;  Surgeon: Kennedy BuckerMenz, Michael, MD;  Location: [facility] ORS;  Service: Orthopedics;  Laterality: Right;  . CARPOMETACARPAL ([facility]) FUSION OF THUMB Left 12/02/2018   Procedure: CARPOMETACARPAL ([facility]) FUSION OF  LEFT THUMB, DIABETIC;  Surgeon: Kennedy BuckerMenz, Michael, MD;  Location: [facility];  Service: Orthopedics;  Laterality: Left;  . COLONOSCOPY    . COLONOSCOPY WITH PROPOFOL N/A 05/19/2018   Procedure: COLONOSCOPY WITH PROPOFOL;  Surgeon: Toledo, Boykin Nearingeodoro K, MD;   Location: [facility];  Service: Gastroenterology;  Laterality: N/A;  . CORONARY ANGIOPLASTY    . EYE SURGERY  2015   Bilateral cataract  . Gastric Sleeve Bariatric Surgery  2017  . HAMMER TOE SURGERY Right 01/15/2018   Procedure: HAMMER TOE CORRECTION-2ND & 3RD;  Surgeon: Recardo Evangelistroxler, Matthew, DPM;  Location: [facility];  Service: Podiatry;  Laterality: Right;  . HERNIA REPAIR Bilateral    inguinal  . JOINT REPLACEMENT  2007   Left knee  . JOINT REPLACEMENT  2015   Right Knee  . left knee arthroscopy 2006 Left   . Right Heel Spur Resection 2012    . Right shoulder AC separation surgery in 1995 Right   . Stem Cells to left heel  2017  . TONSILLECTOMY    . UPPER GI ENDOSCOPY  2017  . Vasectomy 1970's    . WEIL OSTEOTOMY Right 01/15/2018   Procedure: WEIL OSTEOTOMY-2ND & 3RD;  Surgeon: Recardo Evangelistroxler, Matthew, DPM;  Location: [facility];  Service: Podiatry;  Laterality: Right;    There were no vitals filed for this visit.  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  Yes    Pain Score  2     Pain Location  Finger (Comment which one)    Pain Orientation  Left    Pain Descriptors / Indicators  Aching    Pain Type  Surgical pain    Pain Onset  More than a month ago        Assess strength in thumb PA and RA - isometric - better than last time   able upgrade to rubber band            OT Treatments/Exercises (OP) - 01/13/19 0001      LUE Fluidotherapy   Number Minutes Fluidotherapy  10 Minutes    LUE Fluidotherapy Location  Hand;Wrist    Comments  AROM for thumb and wrist in all planes       AROM for thumb PA and RA , circular and opposition 10 reps AAROM and AROM over armrest for wrist flexion, ext, RD, UD   10 reps   no issues   This date upgrade and add to HEP   rubber band to PA and RA of thumb - 12 reps no issues- can use paper to slide thumb for RA  And 1 lbs weight for wrist in all directions 12 reps   Increase to 2 sets in 3 days and 3 sets to 6 days   2 x day  Pain to stay below 1-2/10  Cont with AROM prior to strengthening        OT Education - 01/13/19 0839    Education Details  Strengthening upgrade    Person(s) Educated  Patient    Methods  Explanation;Demonstration;Handout    Comprehension  Verbalized understanding;Returned demonstration       OT Short Term Goals - 12/29/18 1402      OT SHORT TERM GOAL #1   Title  Pt to be ind in HEP to decrease scar tissue, edema and pain and increase AROM to use L hand in 50% or more of ADL's    Baseline  Prefab thumb spica 90% of time on - using hand only in 33% of ADL's    Time  3    Period  Weeks    Status  New    Target Date  01/19/19      OT SHORT TERM GOAL #2   Title  Pain on PRWHE improve with more than 20 points     Baseline  Pain on PRWHE at eval 34/50    Time  3    Period  Weeks    Status  New    Target Date  01/19/19      OT SHORT TERM GOAL #3   Title  R hand digits flexion improve for pt to touch palm without increase syptoms     Baseline  Flexion of 2nd 75 and 3rd 80 with increase pain or pull over MC's    Time  2    Period  Weeks    Status  New    Target Date  01/12/19        OT Long Term Goals - 12/29/18 1404      OT LONG TERM GOAL #1   Title  Lthumb AROM improve in PA and RA and flexion to grap empy cup and retrieve  1 inch object out of palm withou increase symptoms    Baseline  see flowsheet    Time  5    Period  Weeks    Status  New    Target Date  02/02/19      OT LONG TERM GOAL #2   Title  L  wrist AROM improve for pt to use R hand in more than 50%  bath L side of body, apply deodorant and dry with towel    Baseline  see flowsheet    Time  4    Period  Weeks    Status  New     Target Date  01/26/19      OT LONG TERM GOAL #3   Title  L Grip strength and prehension improve to more than 50%  compare to R  hand to do buttons, cut food, carry more than 5 lbs without increase symptoms    Baseline  NT - pt 3 1/2 wks s/p    Time  8    Period  Weeks    Status  New    Target Date  02/22/19      OT LONG TERM GOAL #4   Title  Function on PRWHE improve with more than 20 points     Baseline  function score on PRWHE 33.5/50    Time  8    Period  Weeks    Status  New    Target Date  02/22/19            Plan - 01/13/19 0839    Clinical Impression Statement  Pt is 5 1/2 wks s/p L CMC arthroplasty - pt seen Dr Rosita KeaMenz yesterday - discharge splint - and report pain in hand more arthritis - upgrade pt's HEP to 1 lbs weight and rubber band for thumb PA and RA - no increase pain with one set - pt to gradually increase sets and use - keeping pain under 1-2/10 pain - can use splint to rest thumb as needed    OT Occupational Profile and History  Problem Focused Assessment - Including review of records relating to presenting problem    Occupational performance deficits (Please refer to evaluation for details):  ADL's;IADL's;Play;Leisure;Social Participation;Rest and Sleep    Body Structure / Function / Physical Skills  ADL;Flexibility;ROM;UE functional use;Scar mobility;FMC;Edema;Pain;Strength;IADL    Rehab Potential  Good    Clinical Decision Making  Limited treatment options, no task modification necessary    Comorbidities Affecting Occupational Performance:  None    Modification or Assistance to Complete Evaluation   No modification of tasks or assist necessary to complete eval    OT Frequency  2x / week    OT Duration  8 weeks    OT Treatment/Interventions  Self-care/ADL training;Therapeutic exercise;Patient/family education;Splinting;Paraffin;Fluidtherapy;Contrast Bath;Manual Therapy;Passive range of motion;Functional Mobility Training;Scar mobilization    Plan  assess  progress with HEP andi tolerate strengthening HEP    OT Home Exercise Plan  see pt instruction    Consulted and Agree with Plan of Care  Patient       Patient will benefit from skilled therapeutic intervention in order to improve the following deficits and impairments:   Body Structure / Function / Physical Skills: ADL, Flexibility, ROM, UE functional use, Scar mobility, FMC, Edema, Pain, Strength, IADL       Visit Diagnosis: 1. Stiffness of left wrist, not elsewhere classified   2. Muscle weakness (generalized)   3. Stiffness of left hand, not elsewhere classified  4. Pain in left hand   5. Scar condition and fibrosis of skin       Problem List There are no active problems to display for this patient.   Oletta CohnuPreez, Dorthea Maina OTR/L,CLT 01/13/2019, 9:24 AM  [facility]IC[facility] [address] [place], [place], [address] Phone: [phone]   Fax:  [phone]  Name: Brian CoddingClinton Whitsel MRN: [id number] Date of Birth: 23-Sep-1945

## 2019-01-13 NOTE — Patient Instructions (Signed)
Change isometric - to rubber band to PA and RA of thumb  And 1 lbs weight for wrist in all directions 12 reps all  Increase to 2 sets in 3 days and 3 sets to 6 days   2 x day  Cont with AROM prior  Can use splint to rest for few hrs mid day and end of day

## 2019-01-18 ENCOUNTER — Ambulatory Visit: Payer: Medicare Other | Admitting: Occupational Therapy

## 2019-01-20 ENCOUNTER — Ambulatory Visit: Payer: Medicare Other | Admitting: Occupational Therapy

## 2019-01-20 ENCOUNTER — Other Ambulatory Visit: Payer: Self-pay

## 2019-01-20 DIAGNOSIS — M79642 Pain in left hand: Secondary | ICD-10-CM

## 2019-01-20 DIAGNOSIS — M25642 Stiffness of left hand, not elsewhere classified: Secondary | ICD-10-CM

## 2019-01-20 DIAGNOSIS — M25632 Stiffness of left wrist, not elsewhere classified: Secondary | ICD-10-CM

## 2019-01-20 DIAGNOSIS — M6281 Muscle weakness (generalized): Secondary | ICD-10-CM

## 2019-01-20 DIAGNOSIS — L905 Scar conditions and fibrosis of skin: Secondary | ICD-10-CM

## 2019-01-20 NOTE — Therapy (Signed)
[facility] [address] [place], [place], [address] Phone: [phone]   Fax:  [phone]  Occupational Therapy Treatment  Patient Details  Name: Brian Kramer MRN: [id number] Date of Birth: February 17, 1946 Referring Provider (OT): Dr Rosita KeaMenz   Encounter Date: 01/20/2019  OT End of Session - 01/20/19 0823    Visit Number  5    Number of Visits  12    Date for OT Re-Evaluation  02/22/19    OT Start Time  0802    OT Stop Time  0840    OT Time Calculation (min)  38 min    Activity Tolerance  Patient tolerated treatment well    Behavior During Therapy  [facility] for tasks assessed/performed       Past Medical History:  Diagnosis Date  . Anginal pain (HCC)   . Arthritis   . Coronary artery disease   . Diabetes mellitus without complication (HCC)    Took Metformin for a couple of years. Had bariatric surgery and no longer requires it. A1-C was 5.5 in October 2018.  Marland Kitchen. GERD (gastroesophageal reflux disease)   . Headache   . History of hiatal hernia   . Insomnia   . Myocardial infarction (HCC)   . Peripheral neuropathy    neuropathy  . PUD (peptic ulcer disease)   . RLS (restless legs syndrome)   . Sleep apnea    No requires the use of CPAP    Past Surgical History:  Procedure Laterality Date  . arthroscpic knee 2005 Right   . Cardiac Stent 1997    . Cardiac Stent 2000    . CARPOMETACARPAL ([facility]) FUSION OF THUMB Right 04/08/2018   Procedure: CARPOMETACARPAL [facility]) FUSION OF THUMB;  Surgeon: Kennedy BuckerMenz, Michael, MD;  Location: [facility];  Service: Orthopedics;  Laterality: Right;  . CARPOMETACARPAL ([facility]) FUSION OF THUMB Left 12/02/2018   Procedure: CARPOMETACARPAL ([facility]) FUSION OF  LEFT THUMB, DIABETIC;  Surgeon: Kennedy BuckerMenz, Michael, MD;  Location: [facility];  Service: Orthopedics;  Laterality: Left;  . COLONOSCOPY    . COLONOSCOPY WITH PROPOFOL N/A 05/19/2018   Procedure: COLONOSCOPY WITH PROPOFOL;  Surgeon: Toledo, Boykin Nearingeodoro K, MD;   Location: [facility];  Service: Gastroenterology;  Laterality: N/A;  . CORONARY ANGIOPLASTY    . EYE SURGERY  2015   Bilateral cataract  . Gastric Sleeve Bariatric Surgery  2017  . HAMMER TOE SURGERY Right 01/15/2018   Procedure: HAMMER TOE CORRECTION-2ND & 3RD;  Surgeon: Recardo Evangelistroxler, Matthew, DPM;  Location: [facility];  Service: Podiatry;  Laterality: Right;  . HERNIA REPAIR Bilateral    inguinal  . JOINT REPLACEMENT  2007   Left knee  . JOINT REPLACEMENT  2015   Right Knee  . left knee arthroscopy 2006 Left   . Right Heel Spur Resection 2012    . Right shoulder AC separation surgery in 1995 Right   . Stem Cells to left heel  2017  . TONSILLECTOMY    . UPPER GI ENDOSCOPY  2017  . Vasectomy 1970's    . WEIL OSTEOTOMY Right 01/15/2018   Procedure: WEIL OSTEOTOMY-2ND & 3RD;  Surgeon: Recardo Evangelistroxler, Matthew, DPM;  Location: [facility];  Service: Podiatry;  Laterality: Right;    There were no vitals filed for this visit.  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  Yes    Pain Score  2     Pain Location  Wrist    Pain Orientation  Left    Pain Descriptors / Indicators  Aching    Pain Type  Surgical pain    Pain Onset  More than a month ago         [facility] - 01/20/19 0001      Strength   Right Hand Grip (lbs)  80    Right Hand Lateral Pinch  14 lbs    Right Hand 3 Point Pinch  11 lbs    Left Hand Grip (lbs)  38    Left Hand Lateral Pinch  7 lbs    Left Hand 3 Point Pinch  7 lbs       Pt over did things little and use 2 lbs weight instead of 1 lbs- some swelling and pain volar wrist and with wrist flexion        OT Treatments/Exercises (OP) - 01/20/19 0001       LUE Fluidotherapy   Number Minutes Fluidotherapy  10 Minutes    LUE Fluidotherapy Location  Hand;Wrist    Comments  AROM for wrist and thumb  to decrease pain        AROM for thumb PA and RA , circular and opposition 10 reps AAROM and AROM over armrest for wrist flexion, ext, RD, UD  10 reps - some pain with wrist flexoin 2/10     Review again -    rubber band to PA and RA of thumb - 12 reps no issues- can use paper to slide thumb for RA - can do 2 sets and 3 sets until next visit And 1 lbs weight for wrist in all directions 12 reps except flexion and extention for the next 2 days until pain volar wrist better   Increase to 2 sets and 3 sets for sup, pro, RD, UD   2 x day  And light blue putty for gripping - 12 reps - can do 2 sets if pain free  Do ice on volar wrist  Cont with AROM prior to strengthening        OT Education - 01/20/19 0822    Education Details  Strengthening upgrade    Person(s) Educated  Patient    Methods  Explanation;Demonstration;Handout    Comprehension  Verbalized understanding;Returned demonstration       OT Short Term Goals - 12/29/18 1402      OT SHORT TERM GOAL #1   Title  Pt to be ind in HEP to decrease scar tissue, edema and pain and increase AROM to use L hand in 50% or more of ADL's    Baseline  Prefab thumb spica 90% of time on - using hand only in 33% of ADL's    Time  3    Period  Weeks    Status  New    Target Date  01/19/19      OT SHORT TERM GOAL #2   Title  Pain on PRWHE improve with more than 20 points     Baseline  Pain on PRWHE at eval 34/50    Time  3  Period  Weeks    Status  New    Target Date  01/19/19      OT SHORT TERM GOAL #3   Title  R hand digits flexion improve for pt to touch palm without increase syptoms     Baseline  Flexion of 2nd 75 and 3rd 80 with increase pain or pull over MC's    Time  2    Period  Weeks    Status  New    Target Date  01/12/19        OT Long Term Goals - 12/29/18 1404       OT LONG TERM GOAL #1   Title  Lthumb AROM improve in PA and RA and flexion to grap empy cup and retrieve 1 inch object out of palm withou increase symptoms    Baseline  see flowsheet    Time  5    Period  Weeks    Status  New    Target Date  02/02/19      OT LONG TERM GOAL #2   Title  L  wrist AROM improve for pt to use R hand in more than 50%  bath L side of body, apply deodorant and dry with towel    Baseline  see flowsheet    Time  4    Period  Weeks    Status  New    Target Date  01/26/19      OT LONG TERM GOAL #3   Title  L Grip strength and prehension improve to more than 50%  compare to R  hand to do buttons, cut food, carry more than 5 lbs without increase symptoms    Baseline  NT - pt 3 1/2 wks s/p    Time  8    Period  Weeks    Status  New    Target Date  02/22/19      OT LONG TERM GOAL #4   Title  Function on PRWHE improve with more than 20 points     Baseline  function score on PRWHE 33.5/50    Time  8    Period  Weeks    Status  New    Target Date  02/22/19            Plan - 01/20/19 [id number]    Clinical Impression Statement  Pt is 6 1/2 wks s/p L CMC arthroplasty - pt progressing very well -but he did 2 lbs instead of 1 lbs the las few days -and increase pain and swelling for FCU - pain 2/10 with wrist flexion - pt to hold off on flexion and extention -and use 1 lbs weight for RD, UD , sup , [place]- did start some putty this date for gripping - pain free and can go to 3 sets of thumb PA , RA with rubber band    OT Occupational Profile and History  Problem Focused Assessment - Including review of records relating to presenting problem    Occupational performance deficits (Please refer to evaluation for details):  ADL's;IADL's;Play;Leisure;Social Participation;Rest and Sleep    Body Structure / Function / Physical Skills  ADL;Flexibility;ROM;UE functional use;Scar mobility;FMC;Edema;Pain;Strength;IADL    Rehab Potential  Good    Clinical Decision Making  Limited  treatment options, no task modification necessary    Comorbidities Affecting Occupational Performance:  None    Modification or Assistance to Complete Evaluation   No modification of tasks or assist necessary to complete eval  OT Frequency  1x / week    OT Duration  8 weeks    OT Treatment/Interventions  Self-care/ADL training;Therapeutic exercise;Patient/family education;Splinting;Paraffin;Fluidtherapy;Contrast Bath;Manual Therapy;Passive range of motion;Functional Mobility Training;Scar mobilization    Plan  assess progress with HEP andi tolerate strengthening HEP    OT Home Exercise Plan  see pt instruction    Consulted and Agree with Plan of Care  Patient       Patient will benefit from skilled therapeutic intervention in order to improve the following deficits and impairments:   Body Structure / Function / Physical Skills: ADL, Flexibility, ROM, UE functional use, Scar mobility, FMC, Edema, Pain, Strength, IADL       Visit Diagnosis: 1. Stiffness of left wrist, not elsewhere classified   2. Muscle weakness (generalized)   3. Stiffness of left hand, not elsewhere classified   4. Pain in left hand   5. Scar condition and fibrosis of skin       Problem List There are no active problems to display for this patient.   Rosalyn Gess OTR/L,CLT 01/20/2019, 11:51 AM  [place] [facility] MEDICINE [address], [place], [address] Phone: [phone]   Fax:  [phone]  Name: Brian Kramer MRN: [id number] Date of Birth: 1946-05-07

## 2019-01-25 ENCOUNTER — Other Ambulatory Visit: Payer: Self-pay

## 2019-01-25 ENCOUNTER — Ambulatory Visit: Payer: Medicare Other | Admitting: Occupational Therapy

## 2019-01-25 DIAGNOSIS — M6281 Muscle weakness (generalized): Secondary | ICD-10-CM

## 2019-01-25 DIAGNOSIS — M79642 Pain in left hand: Secondary | ICD-10-CM

## 2019-01-25 DIAGNOSIS — L905 Scar conditions and fibrosis of skin: Secondary | ICD-10-CM

## 2019-01-25 DIAGNOSIS — M25632 Stiffness of left wrist, not elsewhere classified: Secondary | ICD-10-CM

## 2019-01-25 DIAGNOSIS — M25642 Stiffness of left hand, not elsewhere classified: Secondary | ICD-10-CM

## 2019-01-25 NOTE — Patient Instructions (Signed)
Wrist splint on at all time  Pain free AROM for wrist in all planes Thumb -rubber band for thumb PA and RA  2 x  20 reps

## 2019-01-25 NOTE — Therapy (Signed)
[facility] PHYSICAL AND SPORTS MEDICINE [address] [place], [place], [address] Phone: [phone]   Fax:  [phone]  Occupational Therapy Treatment  Patient Details  Name: Brian Kramer MRN: [id number] Date of Birth: 1945-11-17 Referring Provider (OT): Dr Rosita KeaMenz   Encounter Date: 01/25/2019  OT End of Session - 01/25/19 0758    Visit Number  6    Number of Visits  12    Date for OT Re-Evaluation  02/22/19    OT Start Time  0730    OT Stop Time  0805    OT Time Calculation (min)  35 min    Activity Tolerance  Patient tolerated treatment well    Behavior During Therapy  [facility] for tasks assessed/performed       Past Medical History:  Diagnosis Date  . Anginal pain (HCC)   . Arthritis   . Coronary artery disease   . Diabetes mellitus without complication (HCC)    Took Metformin for a couple of years. Had bariatric surgery and no longer requires it. A1-C was 5.5 in October 2018.  Marland Kitchen. GERD (gastroesophageal reflux disease)   . Headache   . History of hiatal hernia   . Insomnia   . Myocardial infarction (HCC)   . Peripheral neuropathy    neuropathy  . PUD (peptic ulcer disease)   . RLS (restless legs syndrome)   . Sleep apnea    No requires the use of CPAP    Past Surgical History:  Procedure Laterality Date  . arthroscpic knee 2005 Right   . Cardiac Stent 1997    . Cardiac Stent 2000    . CARPOMETACARPAL ([facility]) FUSION OF THUMB Right 04/08/2018   Procedure: CARPOMETACARPAL [facility]) FUSION OF THUMB;  Surgeon: Kennedy BuckerMenz, Michael, MD;  Location: [facility] ORS;  Service: Orthopedics;  Laterality: Right;  . CARPOMETACARPAL ([facility]) FUSION OF THUMB Left 12/02/2018   Procedure: CARPOMETACARPAL ([facility]) FUSION OF  LEFT THUMB, DIABETIC;  Surgeon: Kennedy BuckerMenz, Michael, MD;  Location: [facility] ORS;  Service: Orthopedics;  Laterality: Left;  . COLONOSCOPY    . COLONOSCOPY WITH PROPOFOL N/A 05/19/2018   Procedure: COLONOSCOPY WITH PROPOFOL;  Surgeon: Toledo, Boykin Nearingeodoro K, MD;   Location: [facility] ENDOS[facility];  Service: Gastroenterology;  Laterality: N/A;  . CORONARY ANGIOPLASTY    . EYE SURGERY  2015   Bilateral cataract  . Gastric Sleeve Bariatric Surgery  2017  . HAMMER TOE SURGERY Right 01/15/2018   Procedure: HAMMER TOE CORRECTION-2ND & 3RD;  Surgeon: Recardo Evangelistroxler, Matthew, DPM;  Location: [facility] ORS;  Service: Podiatry;  Laterality: Right;  . HERNIA REPAIR Bilateral    inguinal  . JOINT REPLACEMENT  2007   Left knee  . JOINT REPLACEMENT  2015   Right Knee  . left knee arthroscopy 2006 Left   . Right Heel Spur Resection 2012    . Right shoulder AC separation surgery in 1995 Right   . Stem Cells to left heel  2017  . TONSILLECTOMY    . UPPER GI ENDOSCOPY  2017  . Vasectomy 1970's    . WEIL OSTEOTOMY Right 01/15/2018   Procedure: WEIL OSTEOTOMY-2ND & 3RD;  Surgeon: Recardo Evangelistroxler, Matthew, DPM;  Location: [facility] ORS;  Service: Podiatry;  Laterality: Right;    There were no vitals filed for this visit.  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  Yes    Pain Score  3     Pain Location  Wrist    Pain Orientation  Left    Pain Descriptors / Indicators  Aching    Pain Type  Surgical pain    Pain Onset  More than a month ago         [facility] - 01/25/19 0001      Strength   Left Hand Grip (lbs)  35    Left Hand Lateral Pinch  8 lbs    Left Hand 3 Point Pinch  8 lbs      Pain with resistance and AROM for wrist flexion  And some pain with making fist or gripping  Pain over distal FCU and swelling  Pt fitted with wrist splint - to wear most all the time - pain free in splint  And can only do pain free gentle wrist AROM 2 x day And thumb can do rubber band for thumb PA and RA   Done this  date ionto with dexamethasone over distal FCU  Small patch -and 2.0 current for 19 min  Tolerated well -and no allergies it per pt                  OT Education - 01/25/19 0757    Education Details  splint wearing for wrist - most all the time - pain free AROM -    Person(s) Educated  Patient    Methods  Explanation;Demonstration;Handout    Comprehension  Verbalized understanding;Returned demonstration       OT Short Term Goals - 12/29/18 1402      OT SHORT TERM GOAL #1   Title  Pt to be ind in HEP to decrease scar tissue, edema and pain and increase AROM to use L hand in 50% or more of ADL's    Baseline  Prefab thumb spica 90% of time on - using hand only in 33% of ADL's    Time  3    Period  Weeks    Status  New    Target Date  01/19/19      OT SHORT TERM GOAL #2   Title  Pain on PRWHE improve with more than 20 points     Baseline  Pain on PRWHE at eval 34/50    Time  3    Period  Weeks    Status  New    Target Date  01/19/19      OT SHORT TERM GOAL #3   Title  R hand digits flexion improve for pt to touch palm without increase syptoms     Baseline  Flexion of 2nd 75 and 3rd 80 with increase pain or pull over MC's    Time  2    Period  Weeks    Status  New    Target Date  01/12/19        OT Long Term Goals - 12/29/18 1404      OT LONG TERM GOAL #1   Title  Lthumb AROM improve in PA and RA and flexion to grap empy cup and retrieve 1 inch object out of palm withou increase symptoms    Baseline  see flowsheet    Time  5  Period  Weeks    Status  New    Target Date  02/02/19      OT LONG TERM GOAL #2   Title  L  wrist AROM improve for pt to use R hand in more than 50%  bath L side of body, apply deodorant and dry with towel    Baseline  see flowsheet    Time  4    Period  Weeks    Status  New    Target Date  01/26/19      OT LONG TERM GOAL #3   Title  L Grip strength and prehension improve to more than 50%  compare to R  hand to do buttons,  cut food, carry more than 5 lbs without increase symptoms    Baseline  NT - pt 3 1/2 wks s/p    Time  8    Period  Weeks    Status  New    Target Date  02/22/19      OT LONG TERM GOAL #4   Title  Function on PRWHE improve with more than 20 points     Baseline  function score on PRWHE 33.5/50    Time  8    Period  Weeks    Status  New    Target Date  02/22/19            Plan - 01/25/19 0758    Clinical Impression Statement  Pt is about 7 1/2 wks s/p L CMC arthroplasty - pt this date more irritated at FCU than last time - with some swelling - did last week 2 lbs weight instead of one pound -and then putty ontop - pt to hold off on any weight or putty - can do AROM pain free for wrist and thumb - but wear wrist splint most all the time this week - done some ionto on FCU to decrease pain and edema    OT Occupational Profile and History  Problem Focused Assessment - Including review of records relating to presenting problem    Occupational performance deficits (Please refer to evaluation for details):  ADL's;IADL's;Play;Leisure;Social Participation;Rest and Sleep    Body Structure / Function / Physical Skills  ADL;Flexibility;ROM;UE functional use;Scar mobility;FMC;Edema;Pain;Strength;IADL    Rehab Potential  Good    Clinical Decision Making  Limited treatment options, no task modification necessary    Comorbidities Affecting Occupational Performance:  None    Modification or Assistance to Complete Evaluation   No modification of tasks or assist necessary to complete eval    OT Frequency  2x / week    OT Duration  8 weeks    OT Treatment/Interventions  Self-care/ADL training;Therapeutic exercise;Patient/family education;Splinting;Paraffin;Fluidtherapy;Contrast Bath;Manual Therapy;Passive range of motion;Functional Mobility Training;Scar mobilization    Plan  assess pain at FCU -and with splint wearing less pain    OT Home Exercise Plan  see pt instruction    Consulted and Agree with  Plan of Care  Patient       Patient will benefit from skilled therapeutic intervention in order to improve the following deficits and impairments:   Body Structure / Function / Physical Skills: ADL, Flexibility, ROM, UE functional use, Scar mobility, FMC, Edema, Pain, Strength, IADL       Visit Diagnosis: 1. Muscle weakness (generalized)   2. Stiffness of left hand, not elsewhere classified   3. Stiffness of left wrist, not elsewhere classified   4. Pain in left hand   5. Scar condition and  fibrosis of skin       Problem List There are no active problems to display for this patient.   Oletta CohnuPreez, Challen Spainhour OTR/L,CLT 01/25/2019, 8:08 AM  [facility] [facility] [address] [place], [place], [address] Phone: [phone]   Fax:  [phone]  Name: Brian CoddingClinton Kramer MRN: [id number] Date of Birth: 16-Dec-1945

## 2019-01-27 ENCOUNTER — Ambulatory Visit: Payer: Medicare Other | Admitting: Occupational Therapy

## 2019-01-27 ENCOUNTER — Other Ambulatory Visit: Payer: Self-pay

## 2019-01-27 DIAGNOSIS — L905 Scar conditions and fibrosis of skin: Secondary | ICD-10-CM

## 2019-01-27 DIAGNOSIS — M25642 Stiffness of left hand, not elsewhere classified: Secondary | ICD-10-CM

## 2019-01-27 DIAGNOSIS — M79642 Pain in left hand: Secondary | ICD-10-CM

## 2019-01-27 DIAGNOSIS — M25632 Stiffness of left wrist, not elsewhere classified: Secondary | ICD-10-CM

## 2019-01-27 DIAGNOSIS — M6281 Muscle weakness (generalized): Secondary | ICD-10-CM

## 2019-01-27 NOTE — Patient Instructions (Signed)
Pain free AROM for wrist flexion, ext, RD, UD - 10 reps  3 x day after heat  Splint on all the time -and thumb AROM and rubber band for PA and RA

## 2019-01-27 NOTE — Therapy (Signed)
[facility] PHYS[facility] MEDICINE [address] [place], [place], [address] Phone: [phone]   Fax:  [phone]  Occupational Therapy Treatment  Patient Details  Name: Brian Kramer MRN: [id number] Date of Birth: 05/14/1946 Referring Provider (OT): Dr Rosita KeaMenz   Encounter Date: 01/27/2019  OT End of Session - 01/27/19 0811    Visit Number  7    Number of Visits  12    Date for OT Re-Evaluation  02/22/19    OT Start Time  0725    OT Stop Time  0759    OT Time Calculation (min)  34 min    Activity Tolerance  Patient tolerated treatment well    Behavior During Therapy  [facility] for tasks assessed/performed       Past Medical History:  Diagnosis Date  . Anginal pain (HCC)   . Arthritis   . Coronary artery disease   . Diabetes mellitus without complication (HCC)    Took Metformin for a couple of years. Had bariatric surgery and no longer requires it. A1-C was 5.5 in October 2018.  Marland Kitchen. GERD (gastroesophageal reflux disease)   . Headache   . History of hiatal hernia   . Insomnia   . Myocardial infarction (HCC)   . Peripheral neuropathy    neuropathy  . PUD (peptic ulcer disease)   . RLS (restless legs syndrome)   . Sleep apnea    No requires the use of CPAP    Past Surgical History:  Procedure Laterality Date  . arthroscpic knee 2005 Right   . Cardiac Stent 1997    . Cardiac Stent 2000    . CARPOMETACARPAL ([facility]) FUSION OF THUMB Right 04/08/2018   Procedure: CARPOMETACARPAL [facility]) FUSION OF THUMB;  Surgeon: Kennedy BuckerMenz, Michael, MD;  Location: [facility];  Service: Orthopedics;  Laterality: Right;  . CARPOMETACARPAL ([facility]) FUSION OF THUMB Left 12/02/2018   Procedure: CARPOMETACARPAL ([facility]) FUSION OF  LEFT THUMB, DIABETIC;  Surgeon: Kennedy BuckerMenz, Michael, MD;  Location: [facility];  Service: Orthopedics;  Laterality: Left;  . COLONOSCOPY    . COLONOSCOPY WITH PROPOFOL N/A 05/19/2018   Procedure: COLONOSCOPY WITH PROPOFOL;  Surgeon: Toledo, Boykin Nearingeodoro K, MD;   Location: [facility];  Service: Gastroenterology;  Laterality: N/A;  . CORONARY ANGIOPLASTY    . EYE SURGERY  2015   Bilateral cataract  . Gastric Sleeve Bariatric Surgery  2017  . HAMMER TOE SURGERY Right 01/15/2018   Procedure: HAMMER TOE CORRECTION-2ND & 3RD;  Surgeon: Recardo Evangelistroxler, Matthew, DPM;  Location: [facility];  Service: Podiatry;  Laterality: Right;  . HERNIA REPAIR Bilateral    inguinal  . JOINT REPLACEMENT  2007   Left knee  . JOINT REPLACEMENT  2015   Right Knee  . left knee arthroscopy 2006 Left   . Right Heel Spur Resection 2012    . Right shoulder AC separation surgery in 1995 Right   . Stem Cells to left heel  2017  . TONSILLECTOMY    . UPPER GI ENDOSCOPY  2017  . Vasectomy 1970's    . WEIL OSTEOTOMY Right 01/15/2018   Procedure: WEIL OSTEOTOMY-2ND & 3RD;  Surgeon: Recardo Evangelistroxler, Matthew, DPM;  Location: [facility];  Service: Podiatry;  Laterality: Right;    There were no vitals filed for this visit.  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  Yes    Pain Score  2     Pain Location  Wrist    Pain Orientation  Left    Pain Descriptors / Indicators  Aching    Pain Type  Acute pain           Less Pain with resistance and AROM for wrist flexion but pain with wrist extention this date  And some pain with making fist or gripping  Pain over distal FCU and swelling - but better than 2 days ago  Pt to cont with wearing wrist splint - to wear most all the time - pain free in splint   And thumb can do rubber band for thumb PA and RA   Done this date ionto with dexamethasone over distal FCU  Small patch -and 2.0 current for 19 min  Tolerated well -and no allergies it per pt                OT  Treatments/Exercises (OP) - 01/27/19 0001      LUE Fluidotherapy   Number Minutes Fluidotherapy  10 Minutes    LUE Fluidotherapy Location  Hand;Wrist    Comments  AROM for wrist in all planes        to do  pain free gentle wrist AROM for flexion , ext, RD, UD - 10 reps 2-3 x day After heat  Done graston tool nr 2 for brushing and sweeping over volar forearm - wrist gentle after fluido -and prior to AROM for wrist  No  Pain end of session       OT Education - 01/27/19 0737    Education Details  splint wearing for wrist - most all the time - pain free AROM for wrist after heat    Person(s) Educated  Patient    Methods  Explanation;Demonstration;Handout    Comprehension  Verbalized understanding;Returned demonstration       OT Short Term Goals - 12/29/18 1402      OT SHORT TERM GOAL #1   Title  Pt to be ind in HEP to decrease scar tissue, edema and pain and increase AROM to use L hand in 50% or more of ADL's    Baseline  Prefab thumb spica 90% of time on - using hand only in 33% of ADL's    Time  3    Period  Weeks    Status  New    Target Date  01/19/19      OT SHORT TERM GOAL #2   Title  Pain on PRWHE improve with more than 20 points     Baseline  Pain on PRWHE at eval 34/50    Time  3    Period  Weeks    Status  New    Target Date  01/19/19      OT SHORT TERM GOAL #3   Title  R hand digits flexion improve for pt to touch palm without increase syptoms     Baseline  Flexion of 2nd 75 and 3rd 80 with increase pain or pull over MC's    Time  2    Period  Weeks    Status  New    Target Date  01/12/19        OT Long Term Goals -  12/29/18 1404      OT LONG TERM GOAL #1   Title  Lthumb AROM improve in PA and RA and flexion to grap empy cup and retrieve 1 inch object out of palm withou increase symptoms    Baseline  see flowsheet    Time  5    Period  Weeks    Status  New    Target Date  02/02/19      OT LONG TERM GOAL #2   Title  L  wrist AROM improve for pt  to use R hand in more than 50%  bath L side of body, apply deodorant and dry with towel    Baseline  see flowsheet    Time  4    Period  Weeks    Status  New    Target Date  01/26/19      OT LONG TERM GOAL #3   Title  L Grip strength and prehension improve to more than 50%  compare to R  hand to do buttons, cut food, carry more than 5 lbs without increase symptoms    Baseline  NT - pt 3 1/2 wks s/p    Time  8    Period  Weeks    Status  New    Target Date  02/22/19      OT LONG TERM GOAL #4   Title  Function on PRWHE improve with more than 20 points     Baseline  function score on PRWHE 33.5/50    Time  8    Period  Weeks    Status  New    Target Date  02/22/19            Plan - 01/27/19 7893    Clinical Impression Statement  Pt is 8 wks s/p L CMC arthroplasty - pt pain decrease over FCU - but still little swollen and pain with wrist flexion , ext- Done this date fluido and gentle AROM for wrist in all planes - was pain free - but still some pain with gripping and wrist flexion -pt to cont to hold off on gripping and weight for wrist    OT Occupational Profile and History  Problem Focused Assessment - Including review of records relating to presenting problem    Occupational performance deficits (Please refer to evaluation for details):  ADL's;IADL's;Play;Leisure;Social Participation;Rest and Sleep    Body Structure / Function / Physical Skills  ADL;Flexibility;ROM;UE functional use;Scar mobility;FMC;Edema;Pain;Strength;IADL    Rehab Potential  Good    Clinical Decision Making  Limited treatment options, no task modification necessary    Comorbidities Affecting Occupational Performance:  None    Modification or Assistance to Complete Evaluation   No modification of tasks or assist necessary to complete eval    OT Frequency  2x / week    OT Duration  8 weeks    OT Treatment/Interventions  Self-care/ADL training;Therapeutic exercise;Patient/family  education;Splinting;Paraffin;Fluidtherapy;Contrast Bath;Manual Therapy;Passive range of motion;Functional Mobility Training;Scar mobilization    Plan  assess pain at FCU and palpation  for tenderness/edema    OT Home Exercise Plan  see pt instruction    Consulted and Agree with Plan of Care  Patient       Patient will benefit from skilled therapeutic intervention in order to improve the following deficits and impairments:   Body Structure / Function / Physical Skills: ADL, Flexibility, ROM, UE functional use, Scar mobility, FMC, Edema, Pain, Strength, IADL       Visit Diagnosis:  1. Stiffness of left hand, not elsewhere classified   2. Muscle weakness (generalized)   3. Stiffness of left wrist, not elsewhere classified   4. Pain in left hand   5. Scar condition and fibrosis of skin       Problem List There are no active problems to display for this patient.   Oletta CohnuPreez, Bonni Neuser OTR/L,CLT 01/27/2019, 8:14 AM  [place] [facility]ICINE [address] [place], [place], [address] Phone: [phone]   Fax:  [phone]  Name: Brian Kramer MRN: [id number] Date of Birth: 21-Nov-1945

## 2019-02-01 ENCOUNTER — Ambulatory Visit: Payer: Medicare Other | Admitting: Occupational Therapy

## 2019-02-01 ENCOUNTER — Other Ambulatory Visit: Payer: Self-pay

## 2019-02-01 DIAGNOSIS — M25642 Stiffness of left hand, not elsewhere classified: Secondary | ICD-10-CM

## 2019-02-01 DIAGNOSIS — M6281 Muscle weakness (generalized): Secondary | ICD-10-CM

## 2019-02-01 DIAGNOSIS — M25632 Stiffness of left wrist, not elsewhere classified: Secondary | ICD-10-CM

## 2019-02-01 DIAGNOSIS — L905 Scar conditions and fibrosis of skin: Secondary | ICD-10-CM

## 2019-02-01 DIAGNOSIS — M79642 Pain in left hand: Secondary | ICD-10-CM

## 2019-02-01 NOTE — Therapy (Signed)
[facility]IC[facility] [address] [place], [place], [address] Phone: [phone]   Fax:  [phone]  Occupational Therapy Treatment  Patient Details  Name: Brian CoddingClinton Kramer MRN: [id number] Date of Birth: 06-Apr-1946 Referring Provider (OT): Dr Rosita KeaMenz   Encounter Date: 02/01/2019  OT End of Session - 02/01/19 0813    Visit Number  9    Number of Visits  12    Date for OT Re-Evaluation  02/22/19    OT Start Time  0730    OT Stop Time  0820    OT Time Calculation (min)  50 min    Activity Tolerance  Patient tolerated treatment well    Behavior During Therapy  [facility] for tasks assessed/performed       Past Medical History:  Diagnosis Date  . Anginal pain (HCC)   . Arthritis   . Coronary artery disease   . Diabetes mellitus without complication (HCC)    Took Metformin for a couple of years. Had bariatric surgery and no longer requires it. A1-C was 5.5 in October 2018.  Marland Kitchen. GERD (gastroesophageal reflux disease)   . Headache   . History of hiatal hernia   . Insomnia   . Myocardial infarction (HCC)   . Peripheral neuropathy    neuropathy  . PUD (peptic ulcer disease)   . RLS (restless legs syndrome)   . Sleep apnea    No requires the use of CPAP    Past Surgical History:  Procedure Laterality Date  . arthroscpic knee 2005 Right   . Cardiac Stent 1997    . Cardiac Stent 2000    . CARPOMETACARPAL ([facility]) FUSION OF THUMB Right 04/08/2018   Procedure: CARPOMETACARPAL [facility]) FUSION OF THUMB;  Surgeon: Kennedy BuckerMenz, Michael, MD;  Location: [facility] ORS;  Service: Orthopedics;  Laterality: Right;  . CARPOMETACARPAL ([facility]) FUSION OF THUMB Left 12/02/2018   Procedure: CARPOMETACARPAL ([facility]) FUSION OF  LEFT THUMB, DIABETIC;  Surgeon: Kennedy BuckerMenz, Michael, MD;  Location: [facility] ORS;  Service: Orthopedics;  Laterality: Left;  . COLONOSCOPY    . COLONOSCOPY WITH PROPOFOL N/A 05/19/2018   Procedure: COLONOSCOPY WITH PROPOFOL;  Surgeon: Toledo, Boykin Nearingeodoro K, MD;   Location: [facility] ENDOSCOPY;  Service: Gastroenterology;  Laterality: N/A;  . CORONARY ANGIOPLASTY    . EYE SURGERY  2015   Bilateral cataract  . Gastric Sleeve Bariatric Surgery  2017  . HAMMER TOE SURGERY Right 01/15/2018   Procedure: HAMMER TOE CORRECTION-2ND & 3RD;  Surgeon: Recardo Evangelistroxler, Matthew, DPM;  Location: [facility] ORS;  Service: Podiatry;  Laterality: Right;  . HERNIA REPAIR Bilateral    inguinal  . JOINT REPLACEMENT  2007   Left knee  . JOINT REPLACEMENT  2015   Right Knee  . left knee arthroscopy 2006 Left   . Right Heel Spur Resection 2012    . Right shoulder AC separation surgery in 1995 Right   . Stem Cells to left heel  2017  . TONSILLECTOMY    . UPPER GI ENDOSCOPY  2017  . Vasectomy 1970's    . WEIL OSTEOTOMY Right 01/15/2018   Procedure: WEIL OSTEOTOMY-2ND & 3RD;  Surgeon: Recardo Evangelistroxler, Matthew, DPM;  Location: [facility] ORS;  Service: Podiatry;  Laterality: Right;    There were no vitals filed for this visit.  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  No/denies         [facility] OT Assessment - 02/01/19 0001      Strength   Right Hand Grip (lbs)  80    Right Hand Lateral Pinch  14 lbs    Right Hand 3 Point Pinch  11 lbs    Left Hand Grip (lbs)  35    Left Hand Lateral Pinch  9 lbs    Left Hand 3 Point Pinch  8 lbs      Pt has less pain over FCU with wrist flexion , grip but just some stiffness in fisting and wrist AROM   still tender          OT Treatments/Exercises (OP) - 02/01/19 0001      RUE Fluidotherapy   Number Minutes Fluidotherapy  10 Minutes    RUE Fluidotherapy Location  Hand;Wrist    Comments  prior to ROM , to  increase ROM       decrease stiffness in wrist and digits motion after fluido AROM for wrist in all planes  And then  Isometric wrist end range - flexion , ext, RD, UD 12 reps - pain free  liight blue Putty for lat and 3 point grip 12 reps  Attempt gripping but still felt pull over FCU  Pt can start Wean out of splint 2 hrs on and off - and off at night time  But done ionto again this date over FCU - to decrease pain and tenderness    Thumb PA , RA - 4+/5      OT Education - 02/01/19 [phone]    Education Details  splint wearing for wrist - 2 hrs on and off - most all the time - pain free AROM for wrist after heat    Person(s) Educated  Patient    Methods  Explanation;Demonstration;Handout    Comprehension  Verbalized understanding;Returned demonstration       OT Short Term Goals - 12/29/18 1402      OT SHORT TERM GOAL #1   Title  Pt to be ind in HEP to decrease scar tissue, edema and pain and increase AROM to use L hand in 50% or more of ADL's    Baseline  Prefab thumb spica 90% of time on - using hand only in 33% of ADL's    Time  3    Period  Weeks    Status  New    Target Date  01/19/19      OT SHORT TERM GOAL #2   Title  Pain on PRWHE improve with more than 20 points     Baseline  Pain on PRWHE at eval 34/50    Time  3    Period  Weeks    Status  New    Target Date  01/19/19      OT SHORT TERM GOAL #3   Title  R hand digits flexion improve for pt to touch palm without increase syptoms     Baseline  Flexion of 2nd 75 and 3rd 80 with increase pain or pull over MC's    Time  2    Period  Weeks    Status  New    Target Date  01/12/19        OT Long Term Goals - 12/29/18 1404      OT LONG TERM GOAL #1   Title  Lthumb AROM improve in PA and RA and flexion to grap empy cup and retrieve 1 inch object out of palm withou increase symptoms    Baseline  see flowsheet    Time  5    Period  Weeks    Status  New    Target Date  02/02/19      OT LONG TERM GOAL #2    Title  L  wrist AROM improve for pt to use R hand in more than 50%  bath L side of body, apply deodorant and dry with towel    Baseline  see flowsheet    Time  4    Period  Weeks    Status  New    Target Date  01/26/19      OT LONG TERM GOAL #3   Title  L Grip strength and prehension improve to more than 50%  compare to R  hand to do buttons, cut food, carry more than 5 lbs without increase symptoms    Baseline  NT - pt 3 1/2 wks s/p    Time  8    Period  Weeks    Status  New    Target Date  02/22/19      OT LONG TERM GOAL #4   Title  Function on PRWHE improve with more than 20 points     Baseline  function score on PRWHE 33.5/50    Time  8    Period  Weeks    Status  New    Target Date  02/22/19            Plan - 02/01/19 0815    Clinical Impression Statement  Pt is 8 1/2 wks out from L CMCarthroplastly - pt wrist FCU still tender - but pain free with resistance and AROM- some stiffness at wrist - no pain at thumb -gradually remove wrist splint 2 hrs on and off and night time - start putty for thumb but not gripping yet    OT Occupational Profile and History  Problem Focused Assessment - Including review of records relating to presenting problem    Occupational performance deficits (Please refer to evaluation for details):  ADL's;IADL's;Play;Leisure;Social Participation;Rest and Sleep    Body Structure / Function / Physical Skills  ADL;Flexibility;ROM;UE functional use;Scar mobility;FMC;Edema;Pain;Strength;IADL    Rehab Potential  Good    Clinical Decision Making  Limited treatment options, no task modification necessary    Comorbidities Affecting Occupational Performance:  None    Modification or Assistance to Complete Evaluation   No modification of tasks or assist necessary to complete eval    OT Frequency  2x / week    OT Duration  8 weeks    OT Treatment/Interventions  Self-care/ADL training;Therapeutic exercise;Patient/family  education;Splinting;Paraffin;Fluidtherapy;Contrast Bath;Manual Therapy;Passive range of motion;Functional Mobility Training;Scar mobilization    Plan  assess pain at FCU and palpation  for tenderness/edema    OT Home Exercise Plan  see pt instruction    Consulted and Agree with Plan of Care  Patient       Patient will benefit from skilled therapeutic intervention in order to improve the following deficits and impairments:   Body Structure / Function / Physical Skills: ADL, Flexibility, ROM, UE functional use, Scar mobility, FMC, Edema, Pain, Strength,  IADL       Visit Diagnosis: Stiffness of left hand, not elsewhere classified  Muscle weakness (generalized)  Stiffness of left wrist, not elsewhere classified  Pain in left hand  Scar condition and fibrosis of skin    Problem List There are no active problems to display for this patient.   Oletta CohnuPreez, [place] Phifer OTR/L,CLT 02/01/2019, 10:36 AM  [facility]IC[facility] [address] [place], [place], [address] Phone: [phone]   Fax:  [phone]  Name: Brian CoddingClinton Kramer MRN: [id number] Date of Birth: 07-26-1945

## 2019-02-01 NOTE — Patient Instructions (Signed)
Heat , AROM  For wrist in all planes Isometric wrist end range - flexion , ext, RD, UD 12 reps  Putty for lat and 3 point grip 12 reps  Wean out of splint 2 hrs on and off - and off at night time

## 2019-02-04 ENCOUNTER — Other Ambulatory Visit: Payer: Self-pay

## 2019-02-04 ENCOUNTER — Ambulatory Visit: Payer: Medicare Other | Admitting: Occupational Therapy

## 2019-02-04 DIAGNOSIS — M6281 Muscle weakness (generalized): Secondary | ICD-10-CM

## 2019-02-04 DIAGNOSIS — M25642 Stiffness of left hand, not elsewhere classified: Secondary | ICD-10-CM | POA: Diagnosis not present

## 2019-02-04 DIAGNOSIS — M79642 Pain in left hand: Secondary | ICD-10-CM

## 2019-02-04 DIAGNOSIS — M25632 Stiffness of left wrist, not elsewhere classified: Secondary | ICD-10-CM

## 2019-02-04 NOTE — Therapy (Signed)
[facility] PHYSICAL AND SPORTS MEDICINE [address] [place], [address] Phone: [phone]   Fax:  [phone]  Occupational Therapy Treatment  Patient Details  Name: Eliga Arvie MRN: [id number] Date of Birth: 12-02-1945 Referring Provider (OT): Dr Rudene Christians   Encounter Date: 02/04/2019  OT End of Session - 02/04/19 0954    Visit Number  10    Number of Visits  12    Date for OT Re-Evaluation  02/22/19    OT Start Time  0912    OT Stop Time  0947    OT Time Calculation (min)  35 min    Activity Tolerance  Patient tolerated treatment well    Behavior During Therapy  [facility] for tasks assessed/performed       Past Medical History:  Diagnosis Date  . Anginal pain ([place])   . Arthritis   . Coronary artery disease   . Diabetes mellitus without complication (HCC)    Took Metformin for a couple of years. Had bariatric surgery and no longer requires it. A1-C was 5.5 in October 2018.  Marland Kitchen GERD (gastroesophageal reflux disease)   . Headache   . History of hiatal hernia   . Insomnia   . Myocardial infarction ([place])   . Peripheral neuropathy    neuropathy  . PUD (peptic ulcer disease)   . RLS (restless legs syndrome)   . Sleep apnea    No requires the use of CPAP    Past Surgical History:  Procedure Laterality Date  . arthroscpic knee 2005 Right   . Cardiac Stent 1997    . Cardiac Stent 2000    . CARPOMETACARPAL ([place]) FUSION OF THUMB Right 04/08/2018   Procedure: CARPOMETACARPAL [facility]) FUSION OF THUMB;  Surgeon: Hessie Knows, MD;  Location: [facility] ORS;  Service: Orthopedics;  Laterality: Right;  . CARPOMETACARPAL ([place]) FUSION OF THUMB Left 12/02/2018   Procedure: CARPOMETACARPAL ([place]) FUSION OF  LEFT THUMB, DIABETIC;  Surgeon: Hessie Knows, MD;  Location: [facility];  Service: Orthopedics;  Laterality: Left;  . COLONOSCOPY    . COLONOSCOPY WITH PROPOFOL N/A 05/19/2018   Procedure: COLONOSCOPY WITH PROPOFOL;  Surgeon: Toledo, Benay Pike, MD;   Location: [facility];  Service: Gastroenterology;  Laterality: N/A;  . CORONARY ANGIOPLASTY    . EYE SURGERY  2015   Bilateral cataract  . Gastric Sleeve Bariatric Surgery  2017  . HAMMER TOE SURGERY Right 01/15/2018   Procedure: HAMMER TOE CORRECTION-2ND & 3RD;  Surgeon: Albertine Patricia, DPM;  Location: [facility];  Service: Podiatry;  Laterality: Right;  . HERNIA REPAIR Bilateral    inguinal  . JOINT REPLACEMENT  2007   Left knee  . JOINT REPLACEMENT  2015   Right Knee  . left knee arthroscopy 2006 Left   . Right Heel Spur Resection 2012    . Right shoulder AC separation surgery in 1995 Right   . Stem Cells to left heel  2017  . TONSILLECTOMY    . UPPER GI ENDOSCOPY  2017  . Vasectomy 1970's    . WEIL OSTEOTOMY Right 01/15/2018   Procedure: [place];  Surgeon: Albertine Patricia, DPM;  Location: [facility];  Service: Podiatry;  Laterality: Right;    There were no vitals filed for this visit.  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  No/denies          Wrist AROM in all planes - resistance no pain  Tender over FCU still with palpation - 2/10   no pain with grip  Tolerate prehension with light putty well           OT Treatments/Exercises (OP) - 02/04/19 0001      Ultrasound   Ultrasound Location  volar wrist FCU    Ultrasound Parameters  3. 20 % , 1.0 intensity     Ultrasound Goals  Edema;Pain      LUE Fluidotherapy   Number Minutes Fluidotherapy  9 Minutes    LUE Fluidotherapy Location  Hand;Wrist    Comments  AROM for wrist and thumb in all plans decresae stiffness       AROM in wrist - stiffness decrease - no  issues after fluido  Strength in wrist in all planes 5/5     Putty upgrade to teal   lat and 3 point grip - 12-15 reps small snake Pulling and twisting putty - 12-15 reps   increase to 2 sets over weekend and Monday 3 sets  Pain free  do functional strength for grip and wrist - pain free  Splint off most all time- can wear 2 hrs middle of day if needed      OT Education - 02/04/19 0954    Education Details  progress and upgrade HEP    Person(s) Educated  Patient    Methods  Explanation;Demonstration;Handout    Comprehension  Verbalized understanding;Returned demonstration       OT Short Term Goals - 12/29/18 1402      OT SHORT TERM GOAL #1   Title  Pt to be ind in HEP to decrease scar tissue, edema and pain and increase AROM to use L hand in 50% or more of ADL's    Baseline  Prefab thumb spica 90% of time on - using hand only in 33% of ADL's    Time  3    Period  Weeks    Status  New    Target Date  01/19/19      OT SHORT TERM GOAL #2   Title  Pain on PRWHE improve with more than 20 points     Baseline  Pain on PRWHE at eval 34/50    Time  3    Period  Weeks    Status  New    Target Date  01/19/19      OT SHORT TERM GOAL #3   Title  R hand digits flexion improve for pt to touch palm without increase syptoms     Baseline  Flexion of 2nd 75 and 3rd 80 with increase pain or pull over MC's    Time  2    Period  Weeks    Status  New    Target Date  01/12/19        OT Long Term Goals - 12/29/18 1404      OT LONG TERM GOAL #1   Title  Lthumb AROM improve in PA and RA and flexion to grap empy cup and retrieve 1 inch object out of palm withou increase symptoms    Baseline  see flowsheet  Time  5    Period  Weeks    Status  New    Target Date  02/02/19      OT LONG TERM GOAL #2   Title  L  wrist AROM improve for pt to use R hand in more than 50%  bath L side of body, apply deodorant and dry with towel    Baseline  see flowsheet    Time  4    Period  Weeks     Status  New    Target Date  01/26/19      OT LONG TERM GOAL #3   Title  L Grip strength and prehension improve to more than 50%  compare to R  hand to do buttons, cut food, carry more than 5 lbs without increase symptoms    Baseline  NT - pt 3 1/2 wks s/p    Time  8    Period  Weeks    Status  New    Target Date  02/22/19      OT LONG TERM GOAL #4   Title  Function on PRWHE improve with more than 20 points     Baseline  function score on PRWHE 33.5/50    Time  8    Period  Weeks    Status  New    Target Date  02/22/19            Plan - 02/04/19 0954    Clinical Impression Statement  Pt is 9 wks s/p L CMC arthroplasty - pain over FCU only tender when palpated - no pain with wrist resistance or gripping - did hold off on putty for gripping- but upgraded putty for thumb and add some more functional strengthening - can wean out of wrist splint most all the time    OT Occupational Profile and History  Problem Focused Assessment - Including review of records relating to presenting problem    Occupational performance deficits (Please refer to evaluation for details):  ADL's;IADL's;Play;Leisure;Social Participation;Rest and Sleep    Body Structure / Function / Physical Skills  ADL;Flexibility;ROM;UE functional use;Scar mobility;FMC;Edema;Pain;Strength;IADL    Rehab Potential  Good    Clinical Decision Making  Limited treatment options, no task modification necessary    Comorbidities Affecting Occupational Performance:  None    Modification or Assistance to Complete Evaluation   No modification of tasks or assist necessary to complete eval    OT Frequency  2x / week    OT Duration  4 weeks    OT Treatment/Interventions  Self-care/ADL training;Therapeutic exercise;Patient/family education;Splinting;Paraffin;Fluidtherapy;Contrast Bath;Manual Therapy;Passive range of motion;Functional Mobility Training;Scar mobilization    Plan  assess tolerating putty -and grip /prehension assess    OT  Home Exercise Plan  see pt instruction    Consulted and Agree with Plan of Care  Patient       Patient will benefit from skilled therapeutic intervention in order to improve the following deficits and impairments:   Body Structure / Function / Physical Skills: ADL, Flexibility, ROM, UE functional use, Scar mobility, FMC, Edema, Pain, Strength, IADL       Visit Diagnosis: Muscle weakness (generalized)  Stiffness of left hand, not elsewhere classified  Stiffness of left wrist, not elsewhere classified  Pain in left hand    Problem List There are no active problems to display for this patient.   Oletta CohnuPreez, Nello Corro OTR/L,CLT 02/04/2019, 9:57 AM  [facility]IC[facility] [address]  [place], [place], [address] Phone: [phone]   Fax:  [phone]  Name: Learta CoddingClinton Clouse MRN: [id number] Date of Birth: 1946/01/07

## 2019-02-04 NOTE — Patient Instructions (Signed)
Putty upgrade to teal  And do to lat and 3 point grip - 12-15 reps small snake Pulling and twisting putty - 12-15 reps   increase to 2 sets over weekend and Monday 3 sets  Pain free  do functional strength for grip and wrist  Splint off most all time- can wear 2 hrs middle of day if needed

## 2019-02-10 ENCOUNTER — Ambulatory Visit: Payer: Medicare Other | Attending: Orthopedic Surgery | Admitting: Occupational Therapy

## 2019-02-10 ENCOUNTER — Other Ambulatory Visit: Payer: Self-pay

## 2019-02-10 DIAGNOSIS — M6281 Muscle weakness (generalized): Secondary | ICD-10-CM | POA: Insufficient documentation

## 2019-02-10 DIAGNOSIS — L905 Scar conditions and fibrosis of skin: Secondary | ICD-10-CM | POA: Insufficient documentation

## 2019-02-10 DIAGNOSIS — M25632 Stiffness of left wrist, not elsewhere classified: Secondary | ICD-10-CM

## 2019-02-10 DIAGNOSIS — M79642 Pain in left hand: Secondary | ICD-10-CM | POA: Diagnosis present

## 2019-02-10 DIAGNOSIS — M25642 Stiffness of left hand, not elsewhere classified: Secondary | ICD-10-CM

## 2019-02-10 NOTE — Therapy (Signed)
[place] [facility] [address] [place], [address] Phone: [phone]   Fax:  [phone]  Occupational Therapy Treatment  Patient Details  Name: Brian Kramer MRN: [id number] Date of Birth: 08-Jan-1946 Referring Provider (OT): Dr Rosita Kea   Encounter Date: 02/10/2019  OT End of Session - 02/10/19 1035    Visit Number  11    Number of Visits  12    Date for OT Re-Evaluation  02/22/19    OT Start Time  0834    OT Stop Time  0901    OT Time Calculation (min)  27 min    Activity Tolerance  Patient tolerated treatment well    Behavior During Therapy  [facility] for tasks assessed/performed       Past Medical History:  Diagnosis Date  . Anginal pain (HCC)   . Arthritis   . Coronary artery disease   . Diabetes mellitus without complication (HCC)    Took Metformin for a couple of years. Had bariatric surgery and no longer requires it. A1-C was 5.5 in October 2018.  Marland Kitchen GERD (gastroesophageal reflux disease)   . Headache   . History of hiatal hernia   . Insomnia   . Myocardial infarction (HCC)   . Peripheral neuropathy    neuropathy  . PUD (peptic ulcer disease)   . RLS (restless legs syndrome)   . Sleep apnea    No requires the use of CPAP    Past Surgical History:  Procedure Laterality Date  . arthroscpic knee 2005 Right   . Cardiac Stent 1997    . Cardiac Stent 2000    . CARPOMETACARPAL ([facility]) FUSION OF THUMB Right 04/08/2018   Procedure: CARPOMETACARPAL [facility]) FUSION OF THUMB;  Surgeon: Kennedy Bucker, MD;  Location: [facility] ORS;  Service: Orthopedics;  Laterality: Right;  . CARPOMETACARPAL ([facility]) FUSION OF THUMB Left 12/02/2018   Procedure: CARPOMETACARPAL ([facility]) FUSION OF  LEFT THUMB, DIABETIC;  Surgeon: Kennedy Bucker, MD;  Location: [facility] ORS;  Service: Orthopedics;  Laterality: Left;  . COLONOSCOPY    . COLONOSCOPY WITH PROPOFOL N/A 05/19/2018   Procedure: COLONOSCOPY WITH PROPOFOL;  Surgeon: Toledo, Boykin Nearing, MD;   Location: [facility] ENDOSCOPY;  Service: Gastroenterology;  Laterality: N/A;  . CORONARY ANGIOPLASTY    . EYE SURGERY  2015   Bilateral cataract  . Gastric Sleeve Bariatric Surgery  2017  . HAMMER TOE SURGERY Right 01/15/2018   Procedure: HAMMER TOE CORRECTION-2ND & 3RD;  Surgeon: Recardo Evangelist, DPM;  Location: [facility] ORS;  Service: Podiatry;  Laterality: Right;  . HERNIA REPAIR Bilateral    inguinal  . JOINT REPLACEMENT  2007   Left knee  . JOINT REPLACEMENT  2015   Right Knee  . left knee arthroscopy 2006 Left   . Right Heel Spur Resection 2012    . Right shoulder AC separation surgery in 1995 Right   . Stem Cells to left heel  2017  . TONSILLECTOMY    . UPPER GI ENDOSCOPY  2017  . Vasectomy 1970's    . WEIL OSTEOTOMY Right 01/15/2018   Procedure: WEIL OSTEOTOMY-2ND & 3RD;  Surgeon: Recardo Evangelist, DPM;  Location: [facility] ORS;  Service: Podiatry;  Laterality: Right;    There were no vitals filed for this visit.  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  Yes    Pain Score  3     Pain Location  Wrist   volar   Pain Orientation  Left    Pain Descriptors / Indicators  Aching    Pain Type  Surgical pain         [facility] OT Assessment - 02/10/19 0001      Strength   Left Hand Grip (lbs)  46    Left Hand Lateral Pinch  8 lbs    Left Hand 3 Point Pinch  8 lbs       Grip improved but prehension stayed same - review with pt putty HEP again to do correctly    focus on lat and 3 point grip - into teal putty   2 sets of 10-12 - pain free  And cont gripping 2 sets of 12  And increase to 3rd set in 3 days if pain free  Pt still small pocket of  swelling over ulnar volar wrist - FCU?  Some pain with wrist flexion and extention - with open hand   pt to discuss with surgeon  Pt wrist strength in all planes 5/5  And thumb PA and RA 4+/5   Using hand more - but at times volar wrist pain bothers him - but not more than 3/10  Pt to return for one more session in 2 wks              OT Education - 02/10/19 1035    Education Details  review and change HEP    Person(s) Educated  Patient    Methods  Explanation;Demonstration;Handout    Comprehension  Verbalized understanding;Returned demonstration       OT Short Term Goals - 02/10/19 1038      OT SHORT TERM GOAL #1   Title  Pt to be ind in HEP to decrease scar tissue, edema and pain and increase AROM to use L hand in 50% or more of ADL's    Status  Achieved      OT SHORT TERM GOAL #2   Title  Pain on PRWHE improve with more than 20 points     Baseline  Pain on PRWHE at eval 34/50 - and now 9/50    Status  Achieved      OT SHORT TERM GOAL #3   Title  R hand digits flexion improve for pt to touch palm without increase syptoms     Status  Achieved        OT Long Term Goals - 02/10/19 1039      OT LONG TERM GOAL #1   Title  Lthumb AROM improve in PA and RA and flexion to grap empy cup and retrieve 1 inch object out of palm withou increase symptoms    Status  Achieved      OT LONG TERM GOAL #2   Title  L  wrist AROM improve for pt to use R hand in more than 50%  bath L side of body, apply deodorant and dry with towel    Status  Achieved      OT LONG TERM GOAL #3   Title  L Grip strength and prehension  improve to more than 50%  compare to R  hand to do buttons, cut food, carry more than 5 lbs without increase symptoms    Baseline  some pain 3/10 at volar wrist with wrist flexion , ext - and grip with wrist flexion    Time  2    Period  Weeks    Status  On-going    Target Date  02/22/19      OT LONG TERM GOAL #4   Title  Function on PRWHE improve with more than  20 points     Baseline  function score on PRWHE 33.5/50- to be asssess next session    Time  2    Period  Weeks    Status  On-going    Target Date  02/22/19            Plan - 02/10/19 1036    Clinical Impression Statement  Pt is 10 wks s/p L CMC arthroplasty - pt cont to show increase grip this date but prehension stayed the same- change and review HEP with pt again - pt to only do functional strength for wrist - and discuss with surgeon area over FCU that stays swollen and some pain with wrist flexion , extention 3/10 - pt to follow up one more session in 2 wks    OT Occupational Profile and History  Problem Focused Assessment - Including review of records relating to presenting problem    Occupational performance deficits (Please refer to evaluation for details):  ADL's;IADL's;Play;Leisure;Social Participation;Rest and Sleep    Body Structure / Function / Physical Skills  ADL;Flexibility;ROM;UE functional use;Scar mobility;FMC;Edema;Pain;Strength;IADL    Rehab Potential  Good    Clinical Decision Making  Limited treatment options, no task modification necessary    Comorbidities Affecting Occupational Performance:  None    Modification or Assistance to Complete Evaluation   No modification of tasks or assist necessary to complete eval    OT Frequency  Biweekly    OT Duration  2 weeks    OT Treatment/Interventions  Self-care/ADL training;Therapeutic exercise;Patient/family education;Splinting;Paraffin;Fluidtherapy;Contrast Bath;Manual Therapy;Passive range of motion;Functional Mobility Training;Scar mobilization    Plan  pt to discuss area on volar wrist - pain 3/10    OT Home Exercise Plan  see pt instruction    Consulted and Agree with Plan of Care  Patient       Patient will benefit from skilled therapeutic intervention in order to improve the following deficits and impairments:   Body Structure / Function / Physical Skills: ADL, Flexibility, ROM, UE functional use, Scar mobility,  FMC, Edema, Pain, Strength, IADL       Visit Diagnosis: Stiffness of left hand, not elsewhere classified  Muscle weakness (generalized)  Stiffness of left wrist, not elsewhere classified  Pain in left hand  Scar condition and fibrosis of skin    Problem List There are no active problems to display for this patient.   Oletta CohnuPreez, Quy Lotts OTR/L,CLT 02/10/2019, 10:41 AM  [place] [facility] [address] [place], [place], [address] Phone: [phone]   Fax:  [phone]  Name: Brian Kramer MRN: [id number] Date of Birth: 13-Jul-1945

## 2019-02-10 NOTE — Patient Instructions (Signed)
Same HEP- but focus on lat and 3 point grip - into teal putty   2 sets of 10-12 - pain free  And cont gripping 2 sets of 12  And increase to 3rd set in 3 days if pain free  Cont functional strength -and appt with surgeon next week

## 2019-02-22 ENCOUNTER — Other Ambulatory Visit: Payer: Self-pay

## 2019-02-22 ENCOUNTER — Ambulatory Visit: Payer: Medicare Other | Admitting: Occupational Therapy

## 2019-02-22 DIAGNOSIS — M25632 Stiffness of left wrist, not elsewhere classified: Secondary | ICD-10-CM

## 2019-02-22 DIAGNOSIS — M79642 Pain in left hand: Secondary | ICD-10-CM

## 2019-02-22 DIAGNOSIS — M25642 Stiffness of left hand, not elsewhere classified: Secondary | ICD-10-CM

## 2019-02-22 DIAGNOSIS — M6281 Muscle weakness (generalized): Secondary | ICD-10-CM

## 2019-02-22 NOTE — Patient Instructions (Signed)
Pt to keep splint on per MD order- and do gentle AROM for digits and thumb in shower

## 2019-02-22 NOTE — Therapy (Signed)
[place] [facility] [address] [place], [address] Phone: [phone]   Fax:  [phone]  Occupational Therapy Treatment  Patient Details  Name: Brian Kramer MRN: [id number] Date of Birth: 25-Dec-1945 Referring Provider (OT): Dr Rosita Kea   Encounter Date: 02/22/2019  OT End of Session - 02/22/19 0810    Visit Number  12    Number of Visits  12    Date for OT Re-Evaluation  02/22/19    OT Start Time  0731    OT Stop Time  0757    OT Time Calculation (min)  26 min    Activity Tolerance  Patient tolerated treatment well    Behavior During Therapy  [facility] for tasks assessed/performed       Past Medical History:  Diagnosis Date  . Anginal pain (HCC)   . Arthritis   . Coronary artery disease   . Diabetes mellitus without complication (HCC)    Took Metformin for a couple of years. Had bariatric surgery and no longer requires it. A1-C was 5.5 in October 2018.  Marland Kitchen GERD (gastroesophageal reflux disease)   . Headache   . History of hiatal hernia   . Insomnia   . Myocardial infarction (HCC)   . Peripheral neuropathy    neuropathy  . PUD (peptic ulcer disease)   . RLS (restless legs syndrome)   . Sleep apnea    No requires the use of CPAP    Past Surgical History:  Procedure Laterality Date  . arthroscpic knee 2005 Right   . Cardiac Stent 1997    . Cardiac Stent 2000    . CARPOMETACARPAL ([facility]) FUSION OF THUMB Right 04/08/2018   Procedure: CARPOMETACARPAL [facility]) FUSION OF THUMB;  Surgeon: Kennedy Bucker, MD;  Location: [facility] ORS;  Service: Orthopedics;  Laterality: Right;  . CARPOMETACARPAL ([facility]) FUSION OF THUMB Left 12/02/2018   Procedure: CARPOMETACARPAL ([facility]) FUSION OF  LEFT THUMB, DIABETIC;  Surgeon: Kennedy Bucker, MD;  Location: [facility] ORS;  Service: Orthopedics;  Laterality: Left;  . COLONOSCOPY    . COLONOSCOPY WITH PROPOFOL N/A 05/19/2018   Procedure: COLONOSCOPY WITH PROPOFOL;  Surgeon: Toledo, Boykin Nearing, MD;   Location: [facility];  Service: Gastroenterology;  Laterality: N/A;  . CORONARY ANGIOPLASTY    . EYE SURGERY  2015   Bilateral cataract  . Gastric Sleeve Bariatric Surgery  2017  . HAMMER TOE SURGERY Right 01/15/2018   Procedure: HAMMER TOE CORRECTION-2ND & 3RD;  Surgeon: Recardo Evangelist, DPM;  Location: [facility] ORS;  Service: Podiatry;  Laterality: Right;  . HERNIA REPAIR Bilateral    inguinal  . JOINT REPLACEMENT  2007   Left knee  . JOINT REPLACEMENT  2015   Right Knee  . left knee arthroscopy 2006 Left   . Right Heel Spur Resection 2012    . Right shoulder AC separation surgery in 1995 Right   . Stem Cells to left heel  2017  . TONSILLECTOMY    . UPPER GI ENDOSCOPY  2017  . Vasectomy 1970's    . WEIL OSTEOTOMY Right 01/15/2018   Procedure: WEIL OSTEOTOMY-2ND & 3RD;  Surgeon: Recardo Evangelist, DPM;  Location: [facility] ORS;  Service: Podiatry;  Laterality: Right;    There were no vitals filed for this visit.  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  Yes    Pain Score  3     Pain Location  Wrist    Pain Orientation  Left    Pain Descriptors / Indicators  Aching;Shooting    Pain Type  Acute pain    Pain Onset  More than a month ago    Aggravating Factors   With wrist flexion , extention         [facility] OT Assessment - 02/22/19 0001      AROM   Left Wrist Extension  35 Degrees    Left Wrist Flexion  65 Degrees    Left Wrist Radial Deviation  25 Degrees    Left Wrist Ulnar Deviation  34 Degrees      Strength   Left Hand Lateral Pinch  8 lbs    Left Hand 3 Point Pinch  8  lbs       Pt arrive in wrist splint on L wrist - per MD order to wear until end of month - had Cortizone shot by Dr Candelaria Stagers with last visit to surgeon THumb rehab pt doing very well - no pain  Strength in thumb PA and RA 5/5  Prehension strength same in wrist splint - did not decrease  Hold off on grip assessment   Wrist AROM decrease with increase pain in flexion , extention -but could be stiffness too  Pt to follow up end of month with surgeon   Pt to contact me if need to be seen again - under wise will discharge              OT Education - 02/22/19 0810    Education Details  progress , discharge instructions    Person(s) Educated  Patient    Methods  Explanation;Demonstration;Handout    Comprehension  Verbalized understanding;Returned demonstration       OT Short Term Goals - 02/22/19 0818      OT SHORT TERM GOAL #1   Title  Pt to be ind in HEP to decrease scar tissue, edema and pain and increase AROM to use L hand in 50% or more of ADL's    Status  Achieved      OT SHORT TERM GOAL #2   Title  Pain on PRWHE improve with more than 20 points     Baseline  Pain on PRWHE at eval 34/50 - and now 9/50 for thumb -still some wrist pain    Status  Achieved      OT SHORT TERM GOAL #3   Title  R hand digits flexion improve for pt to touch palm without increase syptoms     Status  Achieved        OT Long Term Goals - 02/22/19 0818      OT LONG TERM GOAL #1   Title  Lthumb AROM improve in PA and RA and flexion to grap empy cup and retrieve 1 inch object out of palm withou increase symptoms    Status  Achieved      OT LONG TERM GOAL #2   Title  L  wrist AROM improve for pt to use R hand in more than 50%  bath L side of body, apply deodorant and dry with towel    Baseline  pain at wrist - pt under MD care    Status  Deferred      OT LONG TERM GOAL #3   Title  L Grip strength and prehension improve to more than 50%  compare to R  hand to do buttons, cut food,  carry more than 5 lbs without increase symptoms    Baseline  NT - pt had shot and in wris splint    Status  Deferred      OT LONG TERM GOAL #4   Title  Function on PRWHE improve with more than 20 points     Baseline  function score on PRWHE 33.5/50- NT - in wrist splint    Status  Deferred            Plan - 02/22/19 0812    Clinical Impression Statement  Pt is 11 wks s/p L CMC arthroplasty - pt had shot in the wrist about week ago for wrist pain - pt in wrist splint - per MD order to keep on until end of month - still pain with wrist flexion , extention - thumb doing very well - [facility]    OT Occupational Profile and History  Problem Focused Assessment - Including review of records relating to presenting problem    Occupational performance deficits (Please refer to evaluation for details):  ADL's;IADL's;Play;Leisure;Social Participation;Rest and Sleep    Body Structure / Function / Physical Skills  ADL;Flexibility;ROM;UE functional use;Scar mobility;FMC;Edema;Pain;Strength;IADL    Rehab Potential  Good    Clinical Decision Making  Limited treatment options, no task modification necessary    Comorbidities Affecting Occupational Performance:  None    Modification or Assistance to Complete Evaluation   No modification of tasks or assist necessary to complete eval    Consulted and Agree with Plan of Care  Patient       Patient will benefit from skilled therapeutic intervention in order to improve the following deficits and impairments:   Body Structure / Function / Physical Skills: ADL, Flexibility, ROM, UE functional use, Scar mobility, FMC, Edema, Pain, Strength, IADL       Visit Diagnosis: Stiffness of left hand, not elsewhere classified  Muscle weakness (generalized)  Stiffness of left wrist, not elsewhere classified  Pain in left hand    Problem List There are no active problems to display for this  patient.   Oletta CohnuPreez, Creed Kail OTR/L,CLT 02/22/2019, 8:20 AM  [place] [facility] [address] [place], [place], [address] Phone: [phone]   Fax:  [phone]  Name: Learta CoddingClinton Lund MRN: [id number] Date of Birth: 01/16/1946

## 2019-03-30 ENCOUNTER — Other Ambulatory Visit: Payer: Self-pay | Admitting: Orthopedic Surgery

## 2019-04-07 ENCOUNTER — Encounter
Admission: RE | Admit: 2019-04-07 | Discharge: 2019-04-07 | Disposition: A | Discharge status: Home / Self Care | Payer: Medicare Other | Source: Ambulatory Visit | Attending: Orthopedic Surgery | Admitting: Orthopedic Surgery

## 2019-04-07 ENCOUNTER — Other Ambulatory Visit: Payer: Self-pay

## 2019-04-07 DIAGNOSIS — Z01812 Encounter for preprocedural laboratory examination: Secondary | ICD-10-CM | POA: Insufficient documentation

## 2019-04-07 NOTE — Patient Instructions (Signed)
INSTRUCTIONS FOR SURGERY     Your surgery is scheduled for:   Thursday, November 5TH, 2020     To find out your arrival time for the day of surgery,          please call [phone] between 1 pm and 3 pm on :  Wednesday, November 4TH     When you arrive for surgery, report to the SECOND FLOOR OF THE MEDICAL MALL.       Do NOT stop on the first floor to register.    REMEMBER: Instructions that are not followed completely may result in serious medical risk,  up to and including death, or upon the discretion of your surgeon and anesthesiologist,            your surgery may need to be rescheduled.  __X__ 1. Do not eat food after midnight the night before your procedure.                    No gum, candy, lozenger, tic tacs, tums or hard candies.                  ABSOLUTELY NOTHING SOLID IN YOUR MOUTH AFTER MIDNIGHT                    You may drink unlimited clear liquids up to 2 hours before you are scheduled to arrive for surgery.                   Do not drink anything within those 2 hours unless you need to take medicine, then take the                   smallest amount you need.  Clear liquids include:  water, apple juice without pulp,                   any flavor Gatorade, Black coffee, black tea.  Sugar may be added but no dairy/ honey /lemon.                        Broth and jello is not considered a clear liquid.  __x__  2. On the morning of surgery, please brush your teeth with toothpaste and water. You may rinse with                  mouthwash if you wish but DO NOT SWALLOW TOOTHPASTE OR MOUTHWASH  __X___3. NO alcohol for 24 hours before or after surgery.  __x___ 4.  Do NOT smoke or use e-cigarettes for 24 HOURS PRIOR TO SURGERY.                      DO NOT Use any chewable tobacco products for at least 6 hours prior to surgery.  __x___ 5. If you start any new medication after this appointment and prior to surgery, please                  Bring it with you on the day of surgery.  ___x__ 6. Notify your doctor if there is any change in your  medical condition, such as fever, infection, vomitting,                   Diarrhea or any open sores.  __x___ 7.  USE the CHG SOAP as instructed, the night before surgery and the day of surgery.                   Once you have washed with this soap, do NOT use any of the following: Powders, perfumes                    or lotions. Please do not wear make up, hairpins, clips or nail polish. You MAY wear deodorant.                   Men may shave their face and neck.  Women need to shave 48 hours prior to surgery.                   DO NOT wear ANY jewelry on the day of surgery. If there are rings that are too tight to                    remove easily, please address this prior to the surgery day. Piercings need to be removed.                                                                     NO METAL ON YOUR BODY.                    Do NOT bring any valuables.  If you came to Pre-Admit testing then you will not need license,                     insurance card or credit card.  If you will be staying overnight, please either leave your things in                     the car or have your family be responsible for these items.                      IS NOT RESPONSIBLE FOR BELONGINGS OR VALUABLES.  ___X__ 8. DO NOT wear contact lenses on surgery day.  You may not have dentures,                     Hearing aides, contacts or glasses in the operating room. These items can be                    Placed in the Recovery Room to receive immediately after surgery.  __x___ 9. IF YOU ARE SCHEDULED TO GO HOME ON THE SAME DAY, YOU MUST                   Have someone to drive you home and to stay with you  for the first 24 hours.                    Have an arrangement prior to arriving on surgery day.  ___x__ 10. Take the following medications on the  morning of surgery with a sip of  water:                              1.  LYRICA                     2.                     3.                     4.                     5.                     6.  _____ 11.  Follow any instructions provided to you by your surgeon.                        Such as enema, clear liquid bowel prep  __X__  12. STOP  ASPIRIN AS OF:  TODAY                       THIS INCLUDES BC POWDERS / GOODIES POWDER  __x___ 13. STOP Anti-inflammatories as of: TODAY                      This includes IBUPROFEN / MOTRIN / ADVIL / ALEVE/ NAPROXYN                    YOU MAY TAKE TYLENOL ANY TIME PRIOR TO SURGERY.  _ ______17.  Continue to take the following medications but do not take on the morning of surgery:                      LISINOPRIL AND MULTIVITAMINS  ______18. If staying overnight, please have appropriate shoes to wear to be able to walk around the unit.                   Wear clean and comfortable clothing to the hospital.  TAKE ALL YOUR EVENING MEDICATIONS AS USUAL.

## 2019-04-11 ENCOUNTER — Other Ambulatory Visit
Admission: RE | Admit: 2019-04-11 | Discharge: 2019-04-11 | Disposition: A | Discharge status: Home / Self Care | Payer: Medicare Other | Source: Ambulatory Visit | Attending: Orthopedic Surgery | Admitting: Orthopedic Surgery

## 2019-04-11 ENCOUNTER — Other Ambulatory Visit: Payer: Self-pay

## 2019-04-11 DIAGNOSIS — Z01812 Encounter for preprocedural laboratory examination: Secondary | ICD-10-CM | POA: Insufficient documentation

## 2019-04-11 DIAGNOSIS — Z20828 Contact with and (suspected) exposure to other viral communicable diseases: Secondary | ICD-10-CM | POA: Diagnosis not present

## 2019-04-11 LAB — SARS CORONAVIRUS 2 (TAT 6-24 HRS): SARS Coronavirus 2: NEGATIVE

## 2019-04-13 MED ORDER — CLINDAMYCIN PHOSPHATE 900 MG/50ML IV SOLN
900.0000 mg | Freq: Once | INTRAVENOUS | Status: AC
  Administered 2019-04-14: 07:00:00 900 mg via INTRAVENOUS

## 2019-04-14 ENCOUNTER — Other Ambulatory Visit: Payer: Self-pay

## 2019-04-14 ENCOUNTER — Ambulatory Visit: Payer: Medicare Other | Admitting: Anesthesiology

## 2019-04-14 ENCOUNTER — Encounter
Admission: RE | Disposition: A | Discharge status: Home / Self Care | Payer: Self-pay | Source: Home / Self Care | Attending: Orthopedic Surgery

## 2019-04-14 ENCOUNTER — Ambulatory Visit
Admission: RE | Admit: 2019-04-14 | Discharge: 2019-04-14 | Disposition: A | Discharge status: Home / Self Care | Payer: Medicare Other | Attending: Orthopedic Surgery | Admitting: Orthopedic Surgery

## 2019-04-14 DIAGNOSIS — G5622 Lesion of ulnar nerve, left upper limb: Secondary | ICD-10-CM | POA: Insufficient documentation

## 2019-04-14 DIAGNOSIS — Z96653 Presence of artificial knee joint, bilateral: Secondary | ICD-10-CM | POA: Diagnosis not present

## 2019-04-14 DIAGNOSIS — Z7982 Long term (current) use of aspirin: Secondary | ICD-10-CM | POA: Diagnosis not present

## 2019-04-14 DIAGNOSIS — E1142 Type 2 diabetes mellitus with diabetic polyneuropathy: Secondary | ICD-10-CM | POA: Insufficient documentation

## 2019-04-14 DIAGNOSIS — Z9884 Bariatric surgery status: Secondary | ICD-10-CM | POA: Insufficient documentation

## 2019-04-14 DIAGNOSIS — M1990 Unspecified osteoarthritis, unspecified site: Secondary | ICD-10-CM | POA: Diagnosis not present

## 2019-04-14 DIAGNOSIS — E785 Hyperlipidemia, unspecified: Secondary | ICD-10-CM | POA: Insufficient documentation

## 2019-04-14 DIAGNOSIS — Z880 Allergy status to penicillin: Secondary | ICD-10-CM | POA: Diagnosis not present

## 2019-04-14 DIAGNOSIS — I252 Old myocardial infarction: Secondary | ICD-10-CM | POA: Insufficient documentation

## 2019-04-14 DIAGNOSIS — I10 Essential (primary) hypertension: Secondary | ICD-10-CM | POA: Insufficient documentation

## 2019-04-14 DIAGNOSIS — Z955 Presence of coronary angioplasty implant and graft: Secondary | ICD-10-CM | POA: Diagnosis not present

## 2019-04-14 DIAGNOSIS — I2510 Atherosclerotic heart disease of native coronary artery without angina pectoris: Secondary | ICD-10-CM | POA: Insufficient documentation

## 2019-04-14 DIAGNOSIS — G2581 Restless legs syndrome: Secondary | ICD-10-CM | POA: Insufficient documentation

## 2019-04-14 DIAGNOSIS — Z79899 Other long term (current) drug therapy: Secondary | ICD-10-CM | POA: Insufficient documentation

## 2019-04-14 DIAGNOSIS — Z87891 Personal history of nicotine dependence: Secondary | ICD-10-CM | POA: Diagnosis not present

## 2019-04-14 HISTORY — PX: ULNAR TUNNEL RELEASE: SHX820

## 2019-04-14 LAB — GLUCOSE, CAPILLARY: Glucose-Capillary: 102 mg/dL — ABNORMAL HIGH (ref 70–99)

## 2019-04-14 SURGERY — RELEASE, CUBITAL TUNNEL
Anesthesia: General | Laterality: Left

## 2019-04-14 MED ORDER — CLINDAMYCIN PHOSPHATE 900 MG/50ML IV SOLN
INTRAVENOUS | Status: AC
  Filled 2019-04-14: qty 50

## 2019-04-14 MED ORDER — NEOMYCIN-POLYMYXIN B GU 40-200000 IR SOLN
Status: DC | PRN
  Administered 2019-04-14: 2 mL

## 2019-04-14 MED ORDER — ONDANSETRON HCL 4 MG/2ML IJ SOLN
INTRAMUSCULAR | Status: DC | PRN
  Administered 2019-04-14: 4 mg via INTRAVENOUS

## 2019-04-14 MED ORDER — MIDAZOLAM HCL 2 MG/2ML IJ SOLN
INTRAMUSCULAR | Status: AC
  Filled 2019-04-14: qty 2

## 2019-04-14 MED ORDER — SUCCINYLCHOLINE CHLORIDE 20 MG/ML IJ SOLN
INTRAMUSCULAR | Status: DC | PRN
  Administered 2019-04-14: 100 mg via INTRAVENOUS

## 2019-04-14 MED ORDER — ACETAMINOPHEN 325 MG PO TABS: 325.0000 mg | ORAL_TABLET | Freq: Four times a day (QID) | ORAL | Status: DC | PRN

## 2019-04-14 MED ORDER — FENTANYL CITRATE (PF) 100 MCG/2ML IJ SOLN
INTRAMUSCULAR | Status: DC | PRN
  Administered 2019-04-14: 25 ug via INTRAVENOUS
  Administered 2019-04-14: 50 ug via INTRAVENOUS
  Administered 2019-04-14: 25 ug via INTRAVENOUS

## 2019-04-14 MED ORDER — NEOMYCIN-POLYMYXIN B GU 40-200000 IR SOLN
Status: AC
  Filled 2019-04-14: qty 2

## 2019-04-14 MED ORDER — FENTANYL CITRATE (PF) 100 MCG/2ML IJ SOLN
INTRAMUSCULAR | Status: AC
  Filled 2019-04-14: qty 2

## 2019-04-14 MED ORDER — PROPOFOL 10 MG/ML IV BOLUS
INTRAVENOUS | Status: AC
  Filled 2019-04-14: qty 20

## 2019-04-14 MED ORDER — OXYCODONE HCL 5 MG PO TABS
10.0000 mg | ORAL_TABLET | ORAL | Status: DC | PRN
  Filled 2019-04-14: qty 2

## 2019-04-14 MED ORDER — OXYCODONE-ACETAMINOPHEN 5-325 MG PO TABS: 1.0000 | ORAL_TABLET | ORAL | 0 refills | Status: AC | PRN

## 2019-04-14 MED ORDER — OXYCODONE HCL 5 MG PO TABS
5.0000 mg | ORAL_TABLET | ORAL | Status: DC | PRN
  Administered 2019-04-14: 5 mg via ORAL
  Filled 2019-04-14 (×2): qty 1

## 2019-04-14 MED ORDER — PROPOFOL 10 MG/ML IV BOLUS
INTRAVENOUS | Status: DC | PRN
  Administered 2019-04-14: 130 mg via INTRAVENOUS

## 2019-04-14 MED ORDER — ROCURONIUM BROMIDE 100 MG/10ML IV SOLN
INTRAVENOUS | Status: DC | PRN
  Administered 2019-04-14: 40 mg via INTRAVENOUS
  Administered 2019-04-14: 10 mg via INTRAVENOUS

## 2019-04-14 MED ORDER — BUPIVACAINE HCL (PF) 0.5 % IJ SOLN
INTRAMUSCULAR | Status: AC
  Filled 2019-04-14: qty 30

## 2019-04-14 MED ORDER — HYDROMORPHONE HCL 1 MG/ML IJ SOLN: 0.5000 mg | INTRAMUSCULAR | Status: DC | PRN

## 2019-04-14 MED ORDER — SUGAMMADEX SODIUM 200 MG/2ML IV SOLN
INTRAVENOUS | Status: DC | PRN
  Administered 2019-04-14: 277 mg via INTRAVENOUS

## 2019-04-14 MED ORDER — ONDANSETRON HCL 4 MG/2ML IJ SOLN: 4.0000 mg | Freq: Once | INTRAMUSCULAR | Status: DC | PRN

## 2019-04-14 MED ORDER — GLYCOPYRROLATE 0.2 MG/ML IJ SOLN
INTRAMUSCULAR | Status: DC | PRN
  Administered 2019-04-14: 0.2 mg via INTRAVENOUS

## 2019-04-14 MED ORDER — OXYCODONE HCL 5 MG PO TABS
ORAL_TABLET | ORAL | Status: AC
  Filled 2019-04-14: qty 1

## 2019-04-14 MED ORDER — DEXAMETHASONE SODIUM PHOSPHATE 10 MG/ML IJ SOLN
INTRAMUSCULAR | Status: DC | PRN
  Administered 2019-04-14: 10 mg via INTRAVENOUS

## 2019-04-14 MED ORDER — METOCLOPRAMIDE HCL 5 MG/ML IJ SOLN: 5.0000 mg | Freq: Three times a day (TID) | INTRAMUSCULAR | Status: DC | PRN

## 2019-04-14 MED ORDER — FENTANYL CITRATE (PF) 100 MCG/2ML IJ SOLN: 25.0000 ug | INTRAMUSCULAR | Status: DC | PRN

## 2019-04-14 MED ORDER — ACETAMINOPHEN 500 MG PO TABS: 1000.0000 mg | ORAL_TABLET | Freq: Four times a day (QID) | ORAL | Status: DC

## 2019-04-14 MED ORDER — SEVOFLURANE IN SOLN
RESPIRATORY_TRACT | Status: AC
  Filled 2019-04-14: qty 250

## 2019-04-14 MED ORDER — PHENYLEPHRINE HCL (PRESSORS) 10 MG/ML IV SOLN
INTRAVENOUS | Status: DC | PRN
  Administered 2019-04-14 (×5): 100 ug via INTRAVENOUS

## 2019-04-14 MED ORDER — MIDAZOLAM HCL 2 MG/2ML IJ SOLN
INTRAMUSCULAR | Status: DC | PRN
  Administered 2019-04-14: 2 mg via INTRAVENOUS

## 2019-04-14 MED ORDER — ACETAMINOPHEN 10 MG/ML IV SOLN
INTRAVENOUS | Status: DC | PRN
  Administered 2019-04-14: 1000 mg via INTRAVENOUS

## 2019-04-14 MED ORDER — METOCLOPRAMIDE HCL 10 MG PO TABS: 5.0000 mg | ORAL_TABLET | Freq: Three times a day (TID) | ORAL | Status: DC | PRN

## 2019-04-14 MED ORDER — ONDANSETRON HCL 4 MG PO TABS: 4.0000 mg | ORAL_TABLET | Freq: Four times a day (QID) | ORAL | Status: DC | PRN

## 2019-04-14 MED ORDER — ONDANSETRON HCL 4 MG/2ML IJ SOLN: 4.0000 mg | Freq: Four times a day (QID) | INTRAMUSCULAR | Status: DC | PRN

## 2019-04-14 MED ORDER — LACTATED RINGERS IV SOLN
INTRAVENOUS | Status: DC
  Administered 2019-04-14: 07:00:00 via INTRAVENOUS

## 2019-04-14 MED ORDER — SODIUM CHLORIDE 0.9 % IV SOLN: INTRAVENOUS | Status: DC

## 2019-04-14 MED ORDER — LIDOCAINE HCL (CARDIAC) PF 100 MG/5ML IV SOSY
PREFILLED_SYRINGE | INTRAVENOUS | Status: DC | PRN
  Administered 2019-04-14: 100 mg via INTRAVENOUS

## 2019-04-14 SURGICAL SUPPLY — 41 items
BNDG COHESIVE 4X5 TAN STRL (GAUZE/BANDAGES/DRESSINGS) IMPLANT
BNDG ELASTIC 4X5.8 VLCR STR LF (GAUZE/BANDAGES/DRESSINGS) ×2 IMPLANT
BNDG ESMARK 4X12 TAN STRL LF (GAUZE/BANDAGES/DRESSINGS) ×3 IMPLANT
CANISTER SUCT 1200ML W/VALVE (MISCELLANEOUS) ×3 IMPLANT
CHLORAPREP W/TINT 26 (MISCELLANEOUS) ×3 IMPLANT
CLOSURE WOUND 1/2 X4 (GAUZE/BANDAGES/DRESSINGS)
COVER WAND RF STERILE (DRAPES) ×3 IMPLANT
CUFF TOURN 18 STER (MISCELLANEOUS) IMPLANT
CUFF TOURN 24 STER (MISCELLANEOUS) ×2 IMPLANT
ELECT CAUTERY BLADE 6.4 (BLADE) ×3 IMPLANT
ELECT REM PT RETURN 9FT ADLT (ELECTROSURGICAL) ×3
ELECTRODE REM PT RTRN 9FT ADLT (ELECTROSURGICAL) ×1 IMPLANT
GAUZE SPONGE 4X4 12PLY STRL (GAUZE/BANDAGES/DRESSINGS) ×3 IMPLANT
GAUZE XEROFORM 1X8 LF (GAUZE/BANDAGES/DRESSINGS) ×3 IMPLANT
GLOVE SURG SYN 9.0  PF PI (GLOVE) ×2
GLOVE SURG SYN 9.0 PF PI (GLOVE) ×1 IMPLANT
GOWN SRG 2XL LVL 4 RGLN SLV (GOWNS) ×1 IMPLANT
GOWN STRL NON-REIN 2XL LVL4 (GOWNS) ×2
GOWN STRL REUS W/ TWL LRG LVL3 (GOWN DISPOSABLE) ×1 IMPLANT
GOWN STRL REUS W/TWL LRG LVL3 (GOWN DISPOSABLE) ×2
KIT TURNOVER KIT A (KITS) ×3 IMPLANT
LOOP RED MAXI  1X406MM (MISCELLANEOUS) ×4
LOOP VESSEL MAXI 1X406 RED (MISCELLANEOUS) ×1 IMPLANT
NDL FILTER BLUNT 18X1 1/2 (NEEDLE) ×1 IMPLANT
NEEDLE FILTER BLUNT 18X 1/2SAF (NEEDLE) ×2
NEEDLE FILTER BLUNT 18X1 1/2 (NEEDLE) ×1 IMPLANT
NS IRRIG 500ML POUR BTL (IV SOLUTION) ×3 IMPLANT
PACK EXTREMITY ARMC (MISCELLANEOUS) ×3 IMPLANT
PAD ABD DERMACEA PRESS 5X9 (GAUZE/BANDAGES/DRESSINGS) ×3 IMPLANT
PAD CAST CTTN 4X4 STRL (SOFTGOODS) ×2 IMPLANT
PADDING CAST COTTON 4X4 STRL (SOFTGOODS) ×2
SCALPEL PROTECTED #15 DISP (BLADE) ×4 IMPLANT
SPONGE LAP 18X18 RF (DISPOSABLE) ×3 IMPLANT
STOCKINETTE IMPERVIOUS 9X36 MD (GAUZE/BANDAGES/DRESSINGS) IMPLANT
STRIP CLOSURE SKIN 1/2X4 (GAUZE/BANDAGES/DRESSINGS) ×1 IMPLANT
SUT ETHILON 4-0 (SUTURE) ×2
SUT ETHILON 4-0 FS2 18XMFL BLK (SUTURE) ×1
SUT VIC AB 2-0 SH 27 (SUTURE)
SUT VIC AB 2-0 SH 27XBRD (SUTURE) IMPLANT
SUT VIC AB 3-0 PS2 18 (SUTURE) ×3 IMPLANT
SUTURE ETHLN 4-0 FS2 18XMF BLK (SUTURE) ×1 IMPLANT

## 2019-04-14 NOTE — Anesthesia Preprocedure Evaluation (Signed)
Anesthesia Evaluation  Patient identified by MRN, date of birth, ID band Patient awake    Reviewed: Allergy & Precautions, H&P , NPO status , Patient's Chart, lab work & pertinent test results  History of Anesthesia Complications Negative for: history of anesthetic complications  Airway Mallampati: III  TM Distance: >3 FB Neck ROM: full    Dental  (+) Edentulous Upper, Edentulous Lower   Pulmonary neg shortness of breath, sleep apnea , former smoker,           Cardiovascular Exercise Tolerance: Good (-) angina+ CAD, + Past MI and + Cardiac Stents  (-) DOE      Neuro/Psych  Headaches,  Neuromuscular disease negative psych ROS   GI/Hepatic Neg liver ROS, hiatal hernia, PUD, GERD  Medicated and Controlled,  Endo/Other  diabetes, Type 2  Renal/GU      Musculoskeletal  (+) Arthritis ,   Abdominal   Peds  Hematology negative hematology ROS (+)   Anesthesia Other Findings Past Medical History: No date: Anginal pain (HCC) No date: Arthritis No date: Coronary artery disease No date: Diabetes mellitus without complication (HCC)     Comment:  Took Metformin for a couple of years. Had bariatric               surgery and no longer requires it. A1-C was 5.5 in               October 2018. No date: GERD (gastroesophageal reflux disease) No date: Headache No date: History of hiatal hernia No date: Insomnia No date: Myocardial infarction ([place]) No date: Peripheral neuropathy     Comment:  neuropathy No date: PUD (peptic ulcer disease) No date: RLS (restless legs syndrome) No date: Sleep apnea     Comment:  No requires the use of CPAP  Past Surgical History: No date: arthroscpic knee 2005; Right No date: Cardiac Stent 1997 No date: Cardiac Stent 2000 04/08/2018: CARPOMETACARPAL ([place]) FUSION OF THUMB; Right     Comment:  Procedure: CARPOMETACARPAL [facility]) FUSION OF THUMB;                Surgeon: Hessie Knows, MD;   Location: [facility] ORS;                Service: Orthopedics;  Laterality: Right; No date: COLONOSCOPY 05/19/2018: COLONOSCOPY WITH PROPOFOL; N/A     Comment:  Procedure: COLONOSCOPY WITH PROPOFOL;  Surgeon: Tol[place],               Benay Pike, MD;  Location: [facility] ENDOS[facility];  Service:               Gastroenterology;  Laterality: N/A; No date: CORONARY ANGIOPLASTY 2015: EYE SURGERY     Comment:  Bilateral cataract 2017: Gastric Sleeve Bariatric Surgery 01/15/2018: HAMMER TOE SURGERY; Right     Comment:  Procedure: HAMMER TOE CORRECTION-2ND & 3RD;  Surgeon:               Albertine Patricia, DPM;  Location: [facility] ORS;  Service:               Podiatry;  Laterality: Right; No date: HERNIA REPAIR; Bilateral     Comment:  inguinal 2007: JOINT REPLACEMENT     Comment:  Left knee 2015: JOINT REPLACEMENT     Comment:  Right Knee No date: left knee arthroscopy 2006; Left No date: Right Heel Spur Resection 2012 No date: Right shoulder AC separation surgery in 1995; Right 2017: Stem Cells to left heel No date:  TONSILLECTOMY 2017: UPPER GI ENDOSCOPY No date: Vasectomy 1970's 01/15/2018: WEIL OSTEOTOMY; Right     Comment:  Procedure: WEIL OSTEOTOMY-2ND & 3RD;  Surgeon: Recardo Evangelist, DPM;  Location: [facility] ORS;  Service: Podiatry;                Laterality: Right;  BMI    Body Mass Index: 42.70 kg/m      Reproductive/Obstetrics negative OB ROS                             Anesthesia Physical  Anesthesia Plan  ASA: III  Anesthesia Plan: General LMA   Post-op Pain Management:    Induction: Intravenous  PONV Risk Score and Plan: Dexamethasone, Ondansetron, Midazolam and Treatment may vary due to age or medical condition  Airway Management Planned: Oral ETT  Additional Equipment:   Intra-op Plan:   Post-operative Plan: Extubation in OR  Informed Consent: I have reviewed the patients History and Physical, chart, labs and discussed the procedure  including the risks, benefits and alternatives for the proposed anesthesia with the patient or authorized representative who has indicated his/her understanding and acceptance.     Dental Advisory Given  Plan Discussed with: Anesthesiologist, CRNA and Surgeon  Anesthesia Plan Comments: (Patient consented for risks of anesthesia including but not limited to:  - adverse reactions to medications - damage to teeth, lips or other oral mucosa - sore throat or hoarseness - Damage to heart, brain, lungs or loss of life  Patient voiced understanding.)        Anesthesia Quick Evaluation

## 2019-04-14 NOTE — Discharge Instructions (Addendum)
Try not to use the arm too much but it is okay to bend the elbow.  Loosen Ace wrap if fingers swell but leave the underlying cotton wrap in place.  Keep dressing clean and dry.  Pain medicine as directed.  Call office if you are having problems.  AMBULATORY SURGERY  DISCHARGE INSTRUCTIONS   1) The drugs that you were given will stay in your system until tomorrow so for the next 24 hours you should not:  A) Drive an automobile B) Make any legal decisions C) Drink any alcoholic beverage   2) You may resume regular meals tomorrow.  Today it is better to start with liquids and gradually work up to solid foods.  You may eat anything you prefer, but it is better to start with liquids, then soup and crackers, and gradually work up to solid foods.   3) Please notify your doctor immediately if you have any unusual bleeding, trouble breathing, redness and pain at the surgery site, drainage, fever, or pain not relieved by medication.    4) Additional Instructions:        Please contact your physician with any problems or Same Day Surgery at [phone], Monday through Friday 6 am to 4 pm, or [place] [facility] number at [phone].

## 2019-04-14 NOTE — Transfer of Care (Signed)
Immediate Anesthesia Transfer of Care Note  Patient: Brian Kramer  Procedure(s) Performed: LEFT CUBITAL TUNNEL RELEASE (Left )  Patient Location: PACU  Anesthesia Type:General  Level of Consciousness: sedated  Airway & Oxygen Therapy: Patient Spontanous Breathing and Patient connected to face mask oxygen  Post-op Assessment: Report given to RN and Post -op Vital signs reviewed and stable  Post vital signs: Reviewed and stable  Last Vitals:  Vitals Value Taken Time  BP    Temp    Pulse 57 04/14/19 0825  Resp 20 04/14/19 0825  SpO2 95 % 04/14/19 0825  Vitals shown include unvalidated device data.  Last Pain:  Vitals:   04/14/19 0608  TempSrc: Oral  PainSc: 0-No pain         Complications: No apparent anesthesia complications

## 2019-04-14 NOTE — Op Note (Signed)
04/14/2019  8:26 AM  PATIENT:  Brian Kramer  73 y.o. male  PRE-OPERATIVE DIAGNOSIS:  CUBITAL TUNNEL SYNDROME, LEFT  POST-OPERATIVE DIAGNOSIS:  LEFT CUBITAL TUNNEL SYNDROME  PROCEDURE:  Procedure(s): LEFT CUBITAL TUNNEL RELEASE (Left)  SURGEON: Laurene Footman, MD  ASSISTANTS: None  ANESTHESIA:   general  EBL:  No intake/output data recorded.  BLOOD ADMINISTERED:none  DRAINS: none   LOCAL MEDICATIONS USED:  NONE  SPECIMEN:  No Specimen  DISPOSITION OF SPECIMEN:  N/A  COUNTS:  YES  TOURNIQUET:   Total Tourniquet Time Documented: Upper Arm (Left) - 33 minutes Total: Upper Arm (Left) - 33 minutes   IMPLANTS: None  DICTATION: .Dragon Dictation patient was brought to the operating room and after adequate general anesthesia was obtained the left arm was prepped and draped in usual sterile fashion.  After patient identification and timeout procedure were completed and application of a sterile tourniquet above the elbow tourniquet was raised.  Incision was made posterior to the epicondyle and subcutaneous tissue spread with the ulnar nerve identified just posterior to the medial epicondyle going proximally there not appear to be any compression release carried out approximately 5 cm proximal going distally there appeared to be tight band at the head of the muscle and after release there the nerve appeared to be decompressed when this was completed and on range of motion of the ulnar nerve appeared stable and did not slip out of its groove the wound was irrigated and then closed with 2-0 Vicryl subcutaneously and 4-0 nylon in a simple interrupted fashion.  Xeroform 4 x 4 web roll and Ace wrap applied tourniquet let down the close the case.  PLAN OF CARE: Discharge to home after PACU  PATIENT DISPOSITION:  PACU - hemodynamically stable.

## 2019-04-14 NOTE — H&P (Signed)
Reviewed paper H+P, will be scanned into chart. No changes noted.  

## 2019-04-14 NOTE — Anesthesia Procedure Notes (Signed)
Procedure Name: Intubation Date/Time: 04/14/2019 7:52 AM Performed by: Nelda Marseille, CRNA Pre-anesthesia Checklist: Patient identified, Patient being monitored, Timeout performed, Emergency Drugs available and Suction available Patient Re-evaluated:Patient Re-evaluated prior to induction Oxygen Delivery Method: Circle system utilized Preoxygenation: Pre-oxygenation with 100% oxygen Induction Type: IV induction Ventilation: Mask ventilation without difficulty Laryngoscope Size: Mac, 3 and McGraph Grade View: Grade II Tube type: Oral Tube size: 7.5 mm Number of attempts: 1 Airway Equipment and Method: Stylet Placement Confirmation: ETT inserted through vocal cords under direct vision,  positive ETCO2 and breath sounds checked- equal and bilateral Secured at: 21 cm Tube secured with: Tape Dental Injury: Teeth and Oropharynx as per pre-operative assessment

## 2019-04-14 NOTE — Anesthesia Postprocedure Evaluation (Signed)
Anesthesia Post Note  Patient: Brian Kramer  Procedure(s) Performed: LEFT CUBITAL TUNNEL RELEASE (Left )  Patient location during evaluation: PACU Anesthesia Type: General Level of consciousness: awake and alert and oriented Pain management: pain level controlled Vital Signs Assessment: post-procedure vital signs reviewed and stable Respiratory status: spontaneous breathing Cardiovascular status: blood pressure returned to baseline Anesthetic complications: no     Last Vitals:  Vitals:   04/14/19 0907 04/14/19 0933  BP: (!) 126/50 114/65  Pulse: 70 70  Resp: 16 16  Temp:    SpO2: 93% 94%    Last Pain:  Vitals:   04/14/19 0933  TempSrc:   PainSc: 0-No pain                 Angelisse Riso

## 2019-04-14 NOTE — Anesthesia Post-op Follow-up Note (Signed)
Anesthesia QCDR form completed.        

## 2019-04-15 ENCOUNTER — Encounter: Payer: Self-pay | Admitting: Orthopedic Surgery

## 2019-05-16 ENCOUNTER — Ambulatory Visit: Payer: Medicare Other | Attending: Orthopedic Surgery | Admitting: Speech Pathology

## 2019-05-16 ENCOUNTER — Other Ambulatory Visit: Payer: Self-pay

## 2019-05-16 DIAGNOSIS — R41841 Cognitive communication deficit: Secondary | ICD-10-CM | POA: Diagnosis present

## 2019-05-17 ENCOUNTER — Encounter: Payer: Self-pay | Admitting: Speech Pathology

## 2019-05-17 ENCOUNTER — Other Ambulatory Visit: Payer: Self-pay

## 2019-05-17 NOTE — Therapy (Signed)
[facility] [address] [place], [place], [address] Phone: [phone]   Fax:  [phone]  Speech Language Pathology Evaluation  Patient Details  Name: Brian CoddingClinton Kramer MRN: [id number] Date of Birth: 1946-01-02 Referring Provider (SLP): Dr. Malvin JohnsPotter   Encounter Date: 05/16/2019  End of Session - 05/17/19 1438    Visit Number  1    Number of Visits  9    Date for SLP Re-Evaluation  06/13/19    Authorization Type  Medicare    Authorization Time Period  Start 05/16/2019    Authorization - Visit Number  1    Authorization - Number of Visits  10    SLP Start Time  1500    SLP Stop Time   1545    SLP Time Calculation (min)  45 min    Activity Tolerance  Patient tolerated treatment well       Past Medical History:  Diagnosis Date  . Anginal pain (HCC)    rare  . Arthritis   . Coronary artery disease   . Diabetes mellitus without complication (HCC)    no meds or treatment since bariatric surgery in 2017  . GERD (gastroesophageal reflux disease)    NOT since gastric sleeve  . Headache    has resolved for the most part  . History of hiatal hernia 2004   repaired   . Insomnia   . Myocardial infarction (HCC) 1996  . Peripheral neuropathy    neuropathy  . PUD (peptic ulcer disease)   . RLS (restless legs syndrome)   . Sleep apnea    no osa now since gastric sleeve.  no cpap    Past Surgical History:  Procedure Laterality Date  . arthroscpic knee 2005 Right   . Cardiac Stent 1997    . Cardiac Stent 2000    . CARPOMETACARPAL ([facility]) FUSION OF THUMB Right 04/08/2018   Procedure: CARPOMETACARPAL [facility] FUSION OF THUMB;  Surgeon: Kennedy BuckerMenz, Michael, MD;  Location: [facility] OR[facility];  Service: Orthopedics;  Laterality: Right;  . CARPOMETACARPAL ([facility]) FUSION OF THUMB Left 12/02/2018   Procedure: CARPOMETACARPAL ([facility]) FUSION OF  LEFT THUMB, DIABETIC;  Surgeon: Kennedy BuckerMenz, Michael, MD;  Location: [facility] OR[facility];  Service: Orthopedics;  Laterality: Left;   . COLONOSCOPY    . COLONOSCOPY WITH PROPOFOL N/A 05/19/2018   Procedure: COLONOSCOPY WITH PROPOFOL;  Surgeon: Toledo, Boykin Nearingeodoro K, MD;  Location: [facility];  Service: Gastroenterology;  Laterality: N/A;  . CORONARY ANGIOPLASTY     2 stents  . EYE SURGERY  2015   Bilateral cataract  . Gastric Sleeve Bariatric Surgery  2017  . HAMMER TOE SURGERY Right 01/15/2018   Procedure: HAMMER TOE CORRECTION-2ND & 3RD;  Surgeon: Recardo Evangelistroxler, Matthew, DPM;  Location: [facility] OR[facility];  Service: Podiatry;  Laterality: Right;  . HERNIA REPAIR Bilateral    inguinal  . JOINT REPLACEMENT  2007   Left knee  . JOINT REPLACEMENT  2015   Right Knee  . left knee arthroscopy 2006 Left   . Right Heel Spur Resection 2012    . Right shoulder AC separation surgery in 1995 Right   . Stem Cells to left heel  2017  . TONSILLECTOMY    . ULNAR TUNNEL RELEASE Left 04/14/2019   Procedure: LEFT CUBITAL TUNNEL RELEASE;  Surgeon: Kennedy BuckerMenz, Michael, MD;  Location: [facility] OR[facility];  Service: Orthopedics;  Laterality: Left;  . UPPER GI ENDOSCOPY  2017  . Vasectomy 1970's    . WEIL OSTEOTOMY Right 01/15/2018  Procedure: [place];  Surgeon: Albertine Patricia, DPM;  Location: [facility] OR[facility];  Service: Podiatry;  Laterality: Right;    There were no vitals filed for this visit.      SLP Evaluation [facility] - 05/17/19 1439      SLP Visit Information   SLP Received On  05/16/19    Referring Provider (SLP)  Dr. Melrose Nakayama    Onset Date  05/02/2019    Medical Diagnosis  Memory loss      Subjective   Subjective  The patient is irritated by his difficulty recalling proper nouns    Patient/Family Stated Goal  Tools to aid recall of words/names      Pain Assessment   Currently in Pain?  No/denies      General Information   HPI  Brian Kramer is a 73 year old man referred for cognitive training secondary self-reported memory loss and word finding difficulties (almost exclusively proper nouns/names).  The patient reports minimal impact on  function- he is responsible for paying the bills and cooking.  Both patient and his wife report no significant change in these abilities.      Prior Functional Status   Cognitive/Linguistic Baseline  Within functional limits      Cognition   Overall Cognitive Status  Impaired/Different from baseline      Auditory Comprehension   Overall Auditory Comprehension  Appears within functional limits for tasks assessed      Reading Comprehension   Reading Status  Within funtional limits      Verbal Expression   Overall Verbal Expression  Impaired    Other Verbal Expression Comments  Primarily anomia      Written Expression   Written Expression  Within Functional Limits   Poor handwriting but functional use of electronic devices     Oral Motor/Sensory Function   Overall Oral Motor/Sensory Function  Appears within functional limits for tasks assessed      Motor Speech   Overall Motor Speech  Appears within functional limits for tasks assessed      Standardized Assessments   Standardized Assessments   Montreal Cognitive Assessment (MOCA)       Montreal Cognitive Assessment (MOCA) Version: 8.1 Visuospatial/Executive Alternating trail making       1/1 Visuoconstruction Skills (copy 3-d design) 0/1 Draw a clock     3/3 Naming     3/3 Attention Forward digit span    1/1 Backward digit span    1/1 Vigilance     1/1 Serial 7's     3/3 Language  Verbal Fluency     0/1 Repetition     2/2 Abstraction     2/2 Delayed Recall     3/5  Memory Index Score   9/15 Orientation     6/6 TOTAL      26/30       Normal  ? 26/30   Montreal Cognitive Assessment (MOCA) Version: 8.1 Visuospatial/Executive Alternating trail making       1/1 Visuoconstruction Skills (copy 3-d design) 0/1 Draw a clock     3/3 Naming     3/3 Attention Forward digit span    1/1 Backward digit span    1/1 Vigilance     1/1 Serial 7's     3/3 Language  Verbal  Fluency     0/1 Repetition     2/2 Abstraction     2/2 Delayed Recall    3/5  Memory Index Score  9/15 Orientation  6/6 TOTAL      26/30       Normal  ? 26/30     SLP Education - 05/17/19 1437    Education Details  Results and recommendations    Person(s) Educated  Patient;Spouse    Methods  Explanation    Comprehension  Verbalized understanding         SLP Long Term Goals - 05/17/19 1444      SLP LONG TERM GOAL #1   Title  Patient will demonstrate comprehension of written memory strategies.    Time  4    Period  Weeks    Status  New    Target Date  06/13/19      SLP LONG TERM GOAL #2   Title  Patient will demonstrate functional strategies for anomia events.    Time  4    Period  Weeks    Status  New    Target Date  06/13/19       Plan - 05/17/19 1439    Clinical Impression Statement  This 73 year old man is presenting with mild cognitive communication deficits characterized by short term memory impairment, word finding deficits, difficulty recalling names and reduced word fluency.  The patient demonstrates strong language skills (with the exception of mild anomia).  He has functional reading skills for use in compensatory strategies and functional written skills (using electronic devices due to poor handwriting).  The patient will benefit from skilled speech therapy to identify compensatory strategies for deficits.  Recommend neuropsychology testing for more in-depth testing for differential diagnosis.    Speech Therapy Frequency  2x / week    Duration  4 weeks    Treatment/Interventions  Cognitive reorganization;Compensatory strategies;Patient/family education    Potential to Achieve Goals  Good    Potential Considerations  Ability to learn/carryover information;Pain level;Family/community support;Previous level of function;Cooperation/participation level;Severity of impairments;Medical prognosis    Consulted and Agree with Plan of Care  Patient;Family  member/caregiver    Family Member Consulted  Spouse       Patient will benefit from skilled therapeutic intervention in order to improve the following deficits and impairments:   Cognitive communication deficit - Plan: SLP plan of care cert/re-cert    Problem List There are no active problems to display for this patient.  Dollene Primrose, MS/CCC- SLP  Leandrew Koyanagi 05/17/2019, 2:49 PM  [place] [facility] MAIN [facility] SERVICES [address] [place], [place], [address] Phone: [phone]   Fax:  [phone]  Name: Dhruva Orndoff MRN: [id number] Date of Birth: 09-12-1945

## 2019-05-18 ENCOUNTER — Other Ambulatory Visit: Payer: Self-pay

## 2019-05-18 ENCOUNTER — Ambulatory Visit: Payer: Medicare Other | Admitting: Speech Pathology

## 2019-05-18 DIAGNOSIS — R41841 Cognitive communication deficit: Secondary | ICD-10-CM | POA: Diagnosis not present

## 2019-05-19 ENCOUNTER — Encounter: Payer: Self-pay | Admitting: Speech Pathology

## 2019-05-19 NOTE — Therapy (Signed)
[place] MAIN [facility] SERVICES [address] [place], [place], [address] Phone: [phone]   Fax:  [phone]  Speech Language Pathology Treatment/Discharge Summary  Patient Details  Name: Brian Kramer MRN: [id number] Date of Birth: 1945-09-12 Referring Provider (SLP): Dr. Melrose Nakayama   Encounter Date: 05/18/2019  End of Session - 05/19/19 1245    Visit Number  2    Number of Visits  9    Date for SLP Re-Evaluation  06/13/19    Authorization Type  Medicare    Authorization Time Period  Start 05/16/2019    Authorization - Visit Number  2    Authorization - Number of Visits  10    SLP Start Time  1500    SLP Stop Time   1545    SLP Time Calculation (min)  45 min    Activity Tolerance  Patient tolerated treatment well       Past Medical History:  Diagnosis Date  . Anginal pain ([place])    rare  . Arthritis   . Coronary artery disease   . Diabetes mellitus without complication ([place])    no meds or treatment since bariatric surgery in 2017  . GERD (gastroesophageal reflux disease)    NOT since gastric sleeve  . Headache    has resolved for the most part  . History of hiatal hernia 2004   repaired   . Insomnia   . Myocardial infarction ([place]) 1996  . Peripheral neuropathy    neuropathy  . PUD (peptic ulcer disease)   . RLS (restless legs syndrome)   . Sleep apnea    no osa now since gastric sleeve.  no cpap    Past Surgical History:  Procedure Laterality Date  . arthroscpic knee 2005 Right   . Cardiac Stent 1997    . Cardiac Stent 2000    . CARPOMETACARPAL ([place]) FUSION OF THUMB Right 04/08/2018   Procedure: CARPOMETACARPAL [facility]) FUSION OF THUMB;  Surgeon: Hessie Knows, MD;  Location: [facility] ORS;  Service: Orthopedics;  Laterality: Right;  . CARPOMETACARPAL ([place]) FUSION OF THUMB Left 12/02/2018   Procedure: CARPOMETACARPAL ([place]) FUSION OF  LEFT THUMB, DIABETIC;  Surgeon: Hessie Knows, MD;  Location: [facility] ORS;  Service: Orthopedics;   Laterality: Left;  . COLONOSCOPY    . COLONOSCOPY WITH PROPOFOL N/A 05/19/2018   Procedure: COLONOSCOPY WITH PROPOFOL;  Surgeon: Toledo, Benay Pike, MD;  Location: [facility] ENDOSCOPY;  Service: Gastroenterology;  Laterality: N/A;  . CORONARY ANGIOPLASTY     2 stents  . EYE SURGERY  2015   Bilateral cataract  . Gastric Sleeve Bariatric Surgery  2017  . HAMMER TOE SURGERY Right 01/15/2018   Procedure: HAMMER TOE CORRECTION-2ND & 3RD;  Surgeon: Albertine Patricia, DPM;  Location: [facility] ORS;  Service: Podiatry;  Laterality: Right;  . HERNIA REPAIR Bilateral    inguinal  . JOINT REPLACEMENT  2007   Left knee  . JOINT REPLACEMENT  2015   Right Knee  . left knee arthroscopy 2006 Left   . Right Heel Spur Resection 2012    . Right shoulder AC separation surgery in 1995 Right   . Stem Cells to left heel  2017  . TONSILLECTOMY    . ULNAR TUNNEL RELEASE Left 04/14/2019   Procedure: LEFT CUBITAL TUNNEL RELEASE;  Surgeon: Hessie Knows, MD;  Location: [facility] ORS;  Service: Orthopedics;  Laterality: Left;  . UPPER GI ENDOSCOPY  2017  . Vasectomy 1970's    . WEIL OSTEOTOMY Right 01/15/2018  Procedure: [place];  Surgeon: [place]ine Patricia, DPM;  Location: [facility] ORS;  Service: Podiatry;  Laterality: Right;    There were no vitals filed for this visit.  Subjective Assessment - 05/19/19 1244    Subjective  Patient feels that he is already retrieving words better            ADULT SLP TREATMENT - 05/19/19 0001      General Information   Behavior/Cognition  Alert;Cooperative;Pleasant mood    HPI  This 73 year old man is presenting with mild cognitive communication deficits characterized by short term memory impairment, word finding deficits, difficulty recalling names and reduced word fluency.  The patient demonstrates strong language skills (with the exception of mild anomia).  He has functional reading skills for use in compensatory strategies and functional written skills (using electronic  devices due to poor handwriting).  The patient will benefit from skilled speech therapy to identify compensatory strategies for deficits.  Recommend neuropsychology testing for more in-depth testing for differential diagnosis.      Treatment Provided   Treatment provided  Cognitive-Linquistic      Pain Assessment   Pain Assessment  No/denies pain      Cognitive-Linquistic Treatment   Treatment focused on  Cognition;Aphasia    Skilled Treatment   Goal 1: Patient will demonstrate comprehension of written memory strategies.  Patient provided with written material RE: memory strategies.  Patient demonstrates good mastery of recommendations.  Goal 2: Patient will demonstrate functional strategies for anomia events. Patient provided with written strategies for word retrieval and activities to promote word retrieval.  The patient demonstrates good mastery of strategies; he reports improved word retrieval since discussion at our evaluation.      Assessment / Recommendations / Plan   Plan  Discharge SLP treatment due to (comment);All goals met      Progression Toward Goals   Progression toward goals  Goals met, education completed, patient discharged from [place] Education - 05/19/19 1244    Education Details  Word retrieval strategies and activities, helpful memory strategies    Person(s) Educated  Patient    Methods  Explanation;Handout    Comprehension  Verbalized understanding         SLP Long Term Goals - 05/19/19 1246      SLP LONG TERM GOAL #1   Title  Patient will demonstrate comprehension of written memory strategies.    Status  Achieved      SLP LONG TERM GOAL #2   Title  Patient will demonstrate functional strategies for anomia events.    Status  Achieved       Plan - 05/19/19 1245    Clinical Impression Statement  The patient has met his goals and demonstrates mastery of use of compensatory strategies.  He states he feels confident that he can continue to implement  the strategies and increase his independence at home.   Will discharge for speech therapy at this time.    Speech Therapy Frequency  2x / week    Duration  4 weeks    Treatment/Interventions  Cognitive reorganization;Compensatory strategies;Patient/family education    Potential to Achieve Goals  Good    Potential Considerations  Ability to learn/carryover information;Pain level;Family/community support;Previous level of function;Cooperation/participation level;Severity of impairments;Medical prognosis    SLP Home Exercise Plan  Provided    Consulted and Agree with Plan of Care  Patient       Patient will benefit from skilled therapeutic  intervention in order to improve the following deficits and impairments:   Cognitive communication deficit    Problem List There are no problems to display for this patient.  Leroy Sea, MS/CCC- SLP  Lou Miner 05/19/2019, 12:47 PM  [place] MAIN [facility] SERVICES [address] [place], [place], [address] Phone: [phone]   Fax:  [phone]   Name: Brian Kramer MRN: [id number] Date of Birth: February 24, 1946

## 2019-05-24 ENCOUNTER — Ambulatory Visit: Payer: Medicare Other | Admitting: Speech Pathology

## 2019-05-26 ENCOUNTER — Encounter: Payer: Medicare Other | Admitting: Speech Pathology

## 2019-05-31 ENCOUNTER — Encounter: Payer: Medicare Other | Admitting: Speech Pathology

## 2019-06-02 ENCOUNTER — Encounter: Payer: Medicare Other | Admitting: Speech Pathology

## 2019-06-07 ENCOUNTER — Encounter: Payer: Medicare Other | Admitting: Speech Pathology

## 2019-06-09 ENCOUNTER — Encounter: Payer: Medicare Other | Admitting: Speech Pathology

## 2019-06-13 ENCOUNTER — Encounter: Payer: Medicare Other | Admitting: Speech Pathology

## 2019-06-15 ENCOUNTER — Encounter: Payer: Medicare Other | Admitting: Speech Pathology

## 2019-06-20 ENCOUNTER — Encounter: Payer: Medicare Other | Admitting: Speech Pathology

## 2019-06-22 ENCOUNTER — Encounter: Payer: Medicare Other | Admitting: Speech Pathology

## 2019-08-30 ENCOUNTER — Other Ambulatory Visit: Payer: Self-pay | Admitting: Family Medicine

## 2019-08-31 ENCOUNTER — Other Ambulatory Visit: Payer: Self-pay | Admitting: Family Medicine

## 2019-08-31 DIAGNOSIS — Z87891 Personal history of nicotine dependence: Secondary | ICD-10-CM

## 2019-09-28 ENCOUNTER — Ambulatory Visit
Admission: RE | Admit: 2019-09-28 | Discharge: 2019-09-28 | Disposition: A | Discharge status: Home / Self Care | Payer: Medicare PPO | Source: Ambulatory Visit | Attending: Family Medicine | Admitting: Family Medicine

## 2019-09-28 ENCOUNTER — Other Ambulatory Visit: Payer: Self-pay

## 2019-09-28 DIAGNOSIS — Z136 Encounter for screening for cardiovascular disorders: Secondary | ICD-10-CM | POA: Insufficient documentation

## 2019-09-28 DIAGNOSIS — Z87891 Personal history of nicotine dependence: Secondary | ICD-10-CM | POA: Diagnosis not present

## 2019-10-25 IMAGING — MR MR BRAIN/IAC WO/W
10 of 13 series · 27 of 48 positions shown · IV contrast (gadavist)
Comparison: None.

CLINICAL DATA: Asymmetric right sensorineural hearing loss

EXAM:
MRI HEAD WITHOUT AND WITH CONTRAST
TECHNIQUE: Multiplanar, multiecho pulse sequences of the brain and surrounding
structures were obtained without and with intravenous contrast.
CONTRAST:  10 cc Gadavist intravenous

[Series 5: T1 · sagittal · 5.0mm · 0.62mm/px · 3 of 25 slices shown (1 of 3)]
[im 1/25]
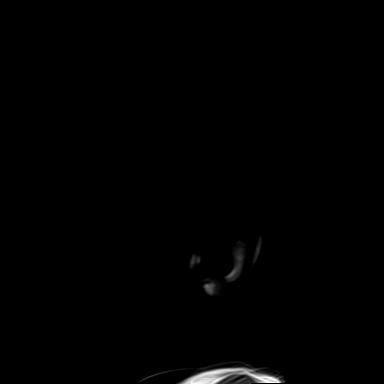
[im 13/25]
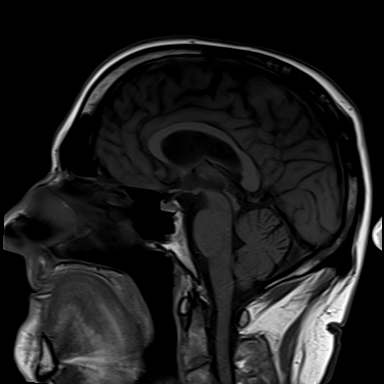
[im 25/25]
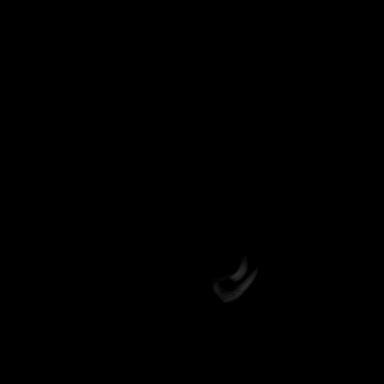

[Series 6: ax dwi_tracew · axial · 3.0mm · 0.73mm/px · z∈[-54,+107]mm · 4 of 55 slices shown]
[im 1/55]
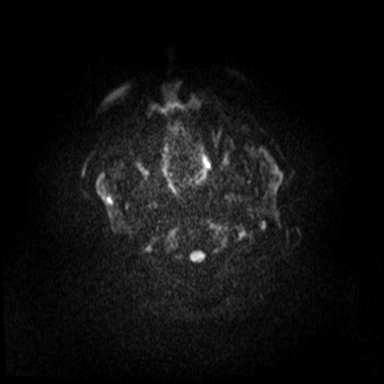
[im 19/55]
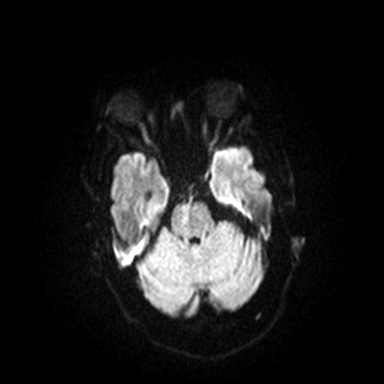
[im 37/55]
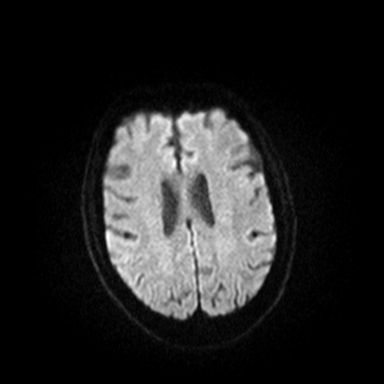
[im 55/55]
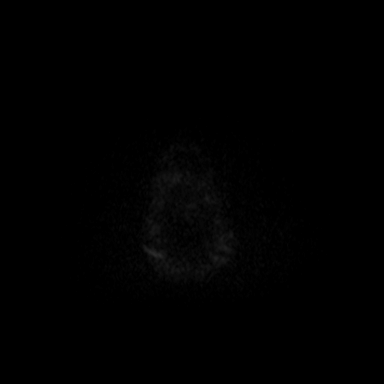

[Series 7: ax dwi_adc · axial · 3.0mm · 0.73mm/px · 1 of 55 slices shown]
[im 1/55]
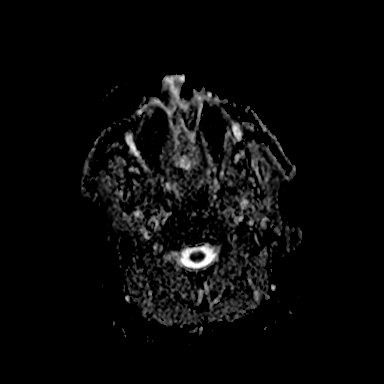

[Series 8: T2 · axial · 5.0mm · 0.53mm/px · z∈[-54,+102]mm · 2 of 27 slices shown]
[im 1/27]
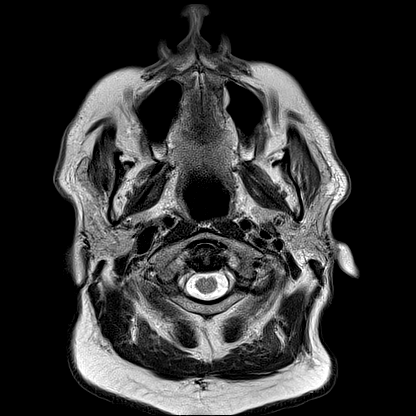
[im 27/27]
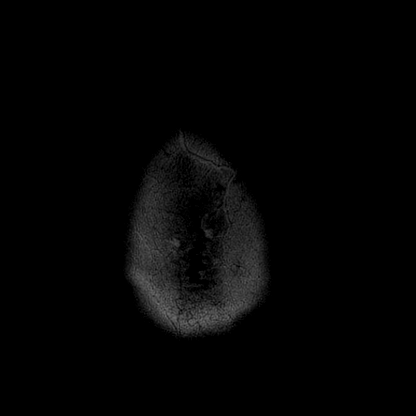

[Series 11: FLAIR · axial · 3.0mm · 0.53mm/px · z∈[-57,+105]mm · 4 of 55 slices shown]
[im 1/55]
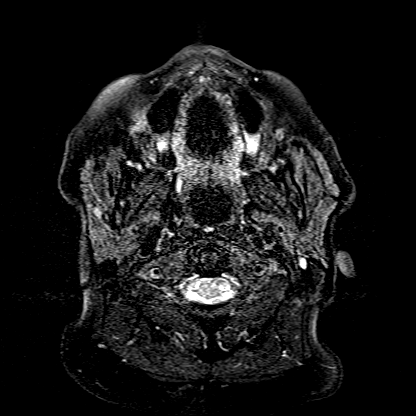
[im 19/55]
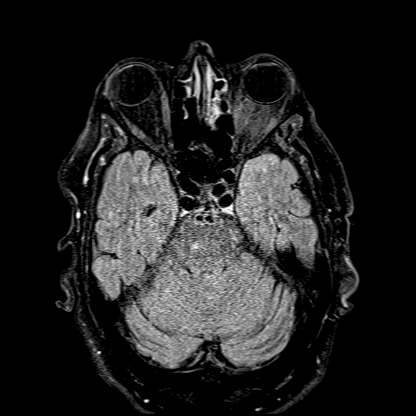
[im 37/55]
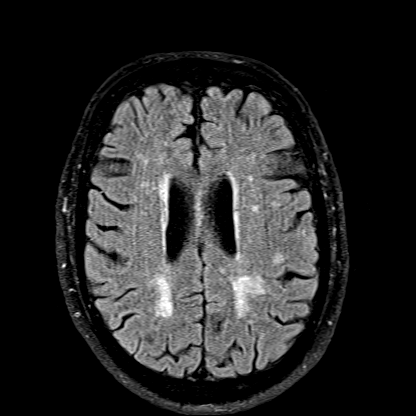
[im 55/55]
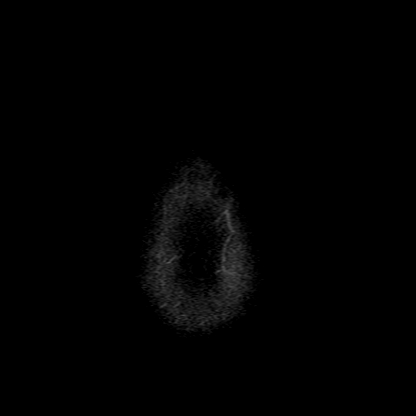

[Series 12: T1 · coronal · non-contrast · 3.0mm · 0.21mm/px · 1 of 13 slices shown (2 of 3)]
[im 1/13]
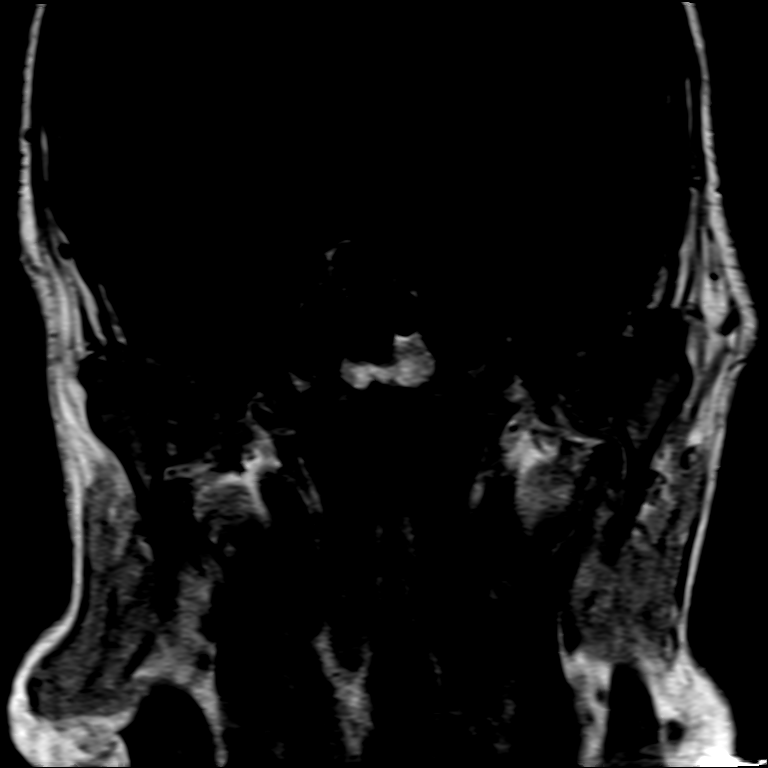

[Series 14: T1 · axial · non-contrast · 3.0mm · 0.21mm/px · 1 of 15 slices shown (3 of 3)]
[im 1/15]
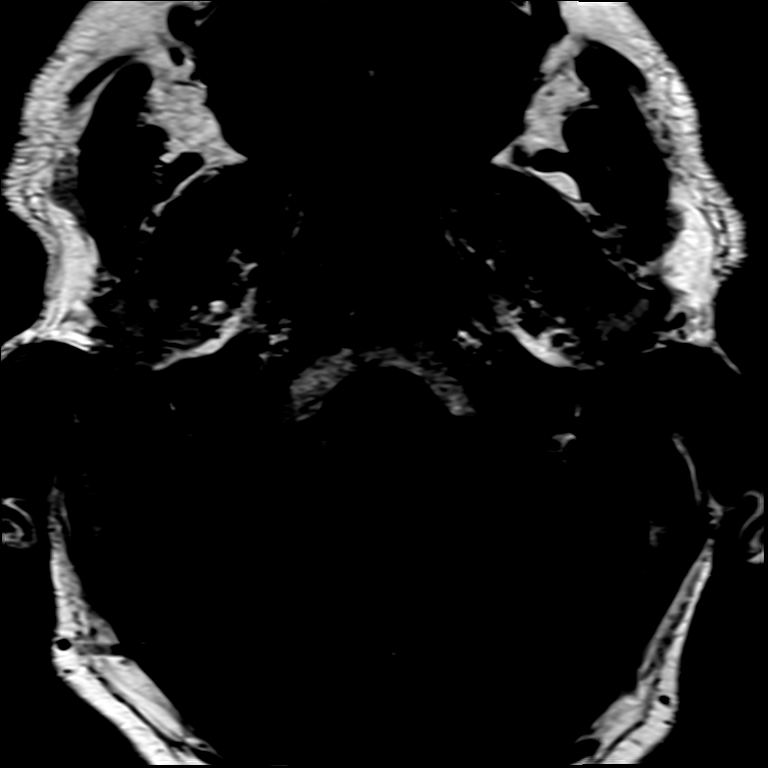

[Series 15: T1 post-contrast · axial · 3.0mm · 0.21mm/px · 1 of 15 slices shown (1 of 3)]
[im 1/15]
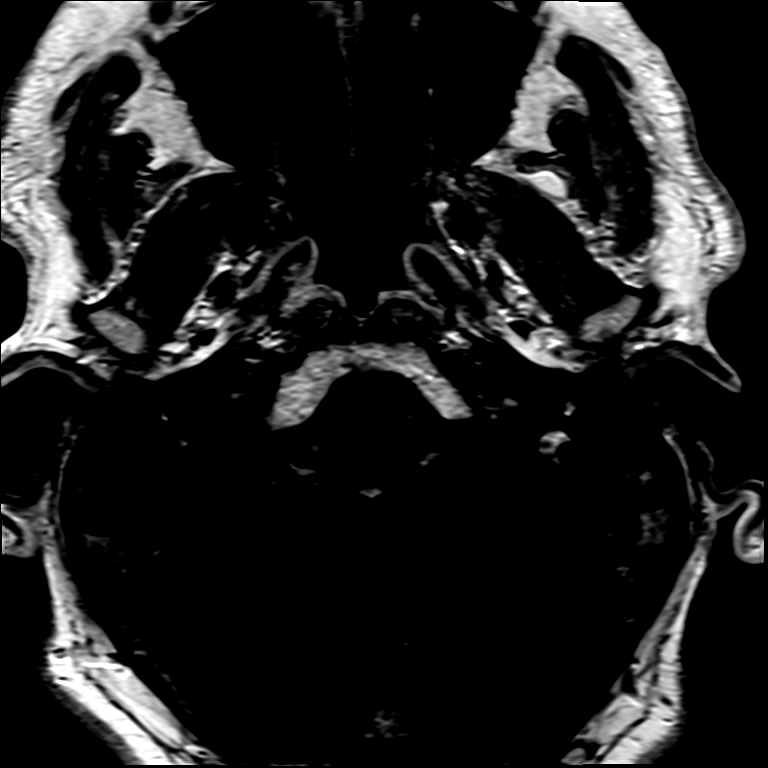

[Series 16: T1 post-contrast · coronal · 3.0mm · 0.21mm/px · 1 of 13 slices shown (2 of 3)]
[im 1/13]
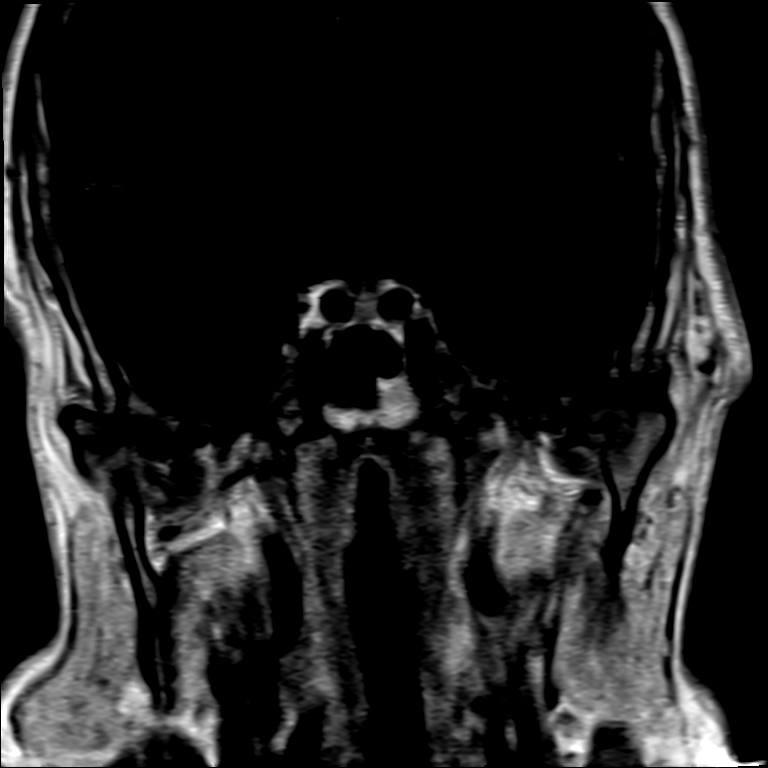

[Series 17: T1 post-contrast · axial · 1.0mm · 0.98mm/px · z∈[-62,+112]mm · 9 of 175 slices shown (3 of 3)]
[im 1/175]
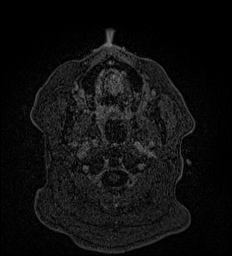
[im 30/175]
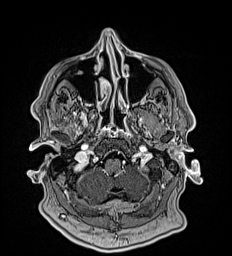
[im 59/175]
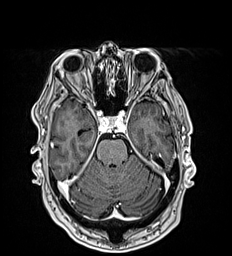
[im 73/175]
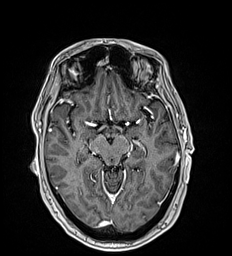
[im 88/175]
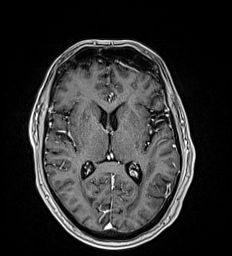
[im 102/175]
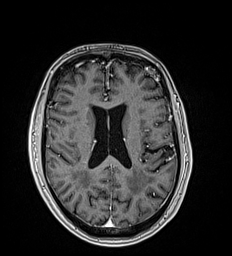
[im 117/175]
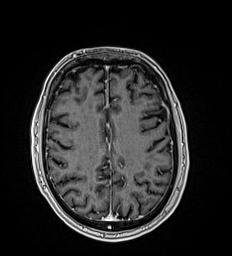
[im 146/175]
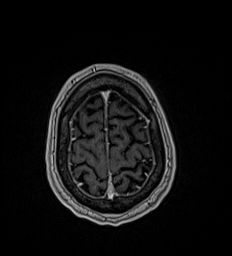
[im 175/175]
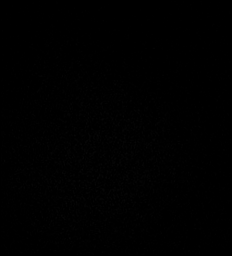

[27 of 48 positions shown; findings below may reference images not displayed]

FINDINGS: Brain: Symmetric normal labyrinthine signal. Normal appearance of
the vestibulocochlear nerves, cisterns, and brainstem.

Chronic small vessel ischemia to a mild/moderate degree in the
cerebral white matter.

No infarct, hemorrhage, hydrocephalus, or collection.

Vascular: Major flow voids are preserved

Skull and upper cervical spine: There is enhancing focus in the left
frontal bone without overlying dural thickening, measuring 17 mm in
diameter. This appears to have a fairly well-defined rim on axial T2
weighted imaging.

Sinuses/Orbits: Bilateral cataract resection.
IMPRESSION: 1. No explanation for hearing loss.
2. Mild to moderate chronic small vessel ischemia.
3. Enhancing lesion in the left frontal bone favored benign in
isolation, possibly hemangioma. If there is history of malignancy or
associated pain, noncontrast head CT is recommended to evaluate
margins.

## 2020-01-12 ENCOUNTER — Other Ambulatory Visit: Payer: Self-pay | Admitting: Student

## 2020-01-12 DIAGNOSIS — R0609 Other forms of dyspnea: Secondary | ICD-10-CM

## 2020-01-12 DIAGNOSIS — I25118 Atherosclerotic heart disease of native coronary artery with other forms of angina pectoris: Secondary | ICD-10-CM

## 2020-01-26 ENCOUNTER — Encounter
Admission: RE | Admit: 2020-01-26 | Discharge: 2020-01-26 | Disposition: A | Discharge status: Home / Self Care | Payer: Medicare PPO | Source: Ambulatory Visit | Attending: Student | Admitting: Student

## 2020-01-26 ENCOUNTER — Ambulatory Visit
Admission: RE | Admit: 2020-01-26 | Discharge: 2020-01-26 | Disposition: A | Discharge status: Home / Self Care | Payer: Medicare PPO | Source: Ambulatory Visit | Attending: Student | Admitting: Student

## 2020-01-26 ENCOUNTER — Other Ambulatory Visit: Payer: Self-pay

## 2020-01-26 DIAGNOSIS — I252 Old myocardial infarction: Secondary | ICD-10-CM | POA: Diagnosis not present

## 2020-01-26 DIAGNOSIS — I25118 Atherosclerotic heart disease of native coronary artery with other forms of angina pectoris: Secondary | ICD-10-CM | POA: Insufficient documentation

## 2020-01-26 DIAGNOSIS — R0600 Dyspnea, unspecified: Secondary | ICD-10-CM | POA: Insufficient documentation

## 2020-01-26 DIAGNOSIS — R0609 Other forms of dyspnea: Secondary | ICD-10-CM

## 2020-01-26 LAB — ECHOCARDIOGRAM COMPLETE: S' Lateral: 3.42 cm

## 2020-01-26 LAB — NM MYOCAR MULTI W/SPECT W/WALL MOTION / EF
Estimated workload: 1 METS
Exercise duration (min): 1 min
Exercise duration (sec): 17 s
LV dias vol: 125 mL (ref 62–150)
LV sys vol: 41 mL
Peak HR: 83 {beats}/min
Percent HR: 83 %
Rest HR: 61 {beats}/min
SDS: 0
SRS: 22
SSS: 15
TID: 1.21

## 2020-01-26 MED ORDER — REGADENOSON 0.4 MG/5ML IV SOLN
0.4000 mg | Freq: Once | INTRAVENOUS | Status: AC
  Administered 2020-01-26: 0.4 mg via INTRAVENOUS

## 2020-01-26 MED ORDER — TECHNETIUM TC 99M TETROFOSMIN IV KIT
10.0000 | PACK | Freq: Once | INTRAVENOUS | Status: AC | PRN
  Administered 2020-01-26: 10.2 via INTRAVENOUS

## 2020-01-26 MED ORDER — TECHNETIUM TC 99M TETROFOSMIN IV KIT
30.0000 | PACK | Freq: Once | INTRAVENOUS | Status: AC | PRN
  Administered 2020-01-26: 30.28 via INTRAVENOUS

## 2020-01-26 NOTE — Progress Notes (Signed)
*  PRELIMINARY RESULTS* Echocardiogram 2D Echocardiogram has been performed.  Cristela Blue 01/26/2020, 10:09 AM

## 2020-02-29 ENCOUNTER — Other Ambulatory Visit (HOSPITAL_COMMUNITY): Payer: Self-pay | Admitting: Neurology

## 2020-02-29 ENCOUNTER — Other Ambulatory Visit: Payer: Self-pay | Admitting: Neurology

## 2020-02-29 DIAGNOSIS — R4189 Other symptoms and signs involving cognitive functions and awareness: Secondary | ICD-10-CM

## 2020-02-29 DIAGNOSIS — Z79899 Other long term (current) drug therapy: Secondary | ICD-10-CM

## 2020-03-12 ENCOUNTER — Ambulatory Visit (HOSPITAL_COMMUNITY)
Admission: RE | Admit: 2020-03-12 | Discharge: 2020-03-12 | Disposition: A | Discharge status: Home / Self Care | Payer: Medicare PPO | Source: Ambulatory Visit | Attending: Neurology | Admitting: Neurology

## 2020-03-12 ENCOUNTER — Other Ambulatory Visit: Payer: Self-pay

## 2020-03-12 DIAGNOSIS — Z79899 Other long term (current) drug therapy: Secondary | ICD-10-CM | POA: Diagnosis present

## 2020-03-12 DIAGNOSIS — R4189 Other symptoms and signs involving cognitive functions and awareness: Secondary | ICD-10-CM | POA: Diagnosis present

## 2020-03-12 MED ORDER — GADOBUTROL 1 MMOL/ML IV SOLN
10.0000 mL | Freq: Once | INTRAVENOUS | Status: AC | PRN
  Administered 2020-03-12: 10 mL via INTRAVENOUS

## 2020-04-18 ENCOUNTER — Other Ambulatory Visit
Admission: RE | Admit: 2020-04-18 | Discharge: 2020-04-18 | Disposition: A | Discharge status: Home / Self Care | Payer: Medicare PPO | Source: Ambulatory Visit | Attending: Student | Admitting: Student

## 2020-04-18 DIAGNOSIS — R0789 Other chest pain: Secondary | ICD-10-CM | POA: Insufficient documentation

## 2020-04-18 LAB — BRAIN NATRIURETIC PEPTIDE: B Natriuretic Peptide: 29.4 pg/mL (ref 0.0–100.0)

## 2020-04-27 ENCOUNTER — Other Ambulatory Visit: Payer: Self-pay

## 2020-04-27 ENCOUNTER — Other Ambulatory Visit
Admission: RE | Admit: 2020-04-27 | Discharge: 2020-04-27 | Disposition: A | Discharge status: Home / Self Care | Payer: Medicare PPO | Source: Ambulatory Visit | Attending: Internal Medicine | Admitting: Internal Medicine

## 2020-04-27 DIAGNOSIS — Z20822 Contact with and (suspected) exposure to covid-19: Secondary | ICD-10-CM | POA: Diagnosis not present

## 2020-04-27 DIAGNOSIS — Z01812 Encounter for preprocedural laboratory examination: Secondary | ICD-10-CM | POA: Insufficient documentation

## 2020-04-27 LAB — SARS CORONAVIRUS 2 (TAT 6-24 HRS): SARS Coronavirus 2: NEGATIVE

## 2020-05-01 ENCOUNTER — Encounter: Payer: Self-pay | Admitting: Internal Medicine

## 2020-05-01 ENCOUNTER — Ambulatory Visit
Admission: RE | Admit: 2020-05-01 | Discharge: 2020-05-01 | Disposition: A | Discharge status: Home / Self Care | Payer: Medicare PPO | Attending: Internal Medicine | Admitting: Internal Medicine

## 2020-05-01 ENCOUNTER — Other Ambulatory Visit: Payer: Self-pay

## 2020-05-01 ENCOUNTER — Encounter
Admission: RE | Disposition: A | Discharge status: Home / Self Care | Payer: Self-pay | Source: Home / Self Care | Attending: Internal Medicine

## 2020-05-01 DIAGNOSIS — Z91013 Allergy to seafood: Secondary | ICD-10-CM | POA: Diagnosis not present

## 2020-05-01 DIAGNOSIS — Z6841 Body Mass Index (BMI) 40.0 and over, adult: Secondary | ICD-10-CM | POA: Diagnosis not present

## 2020-05-01 DIAGNOSIS — Z880 Allergy status to penicillin: Secondary | ICD-10-CM | POA: Insufficient documentation

## 2020-05-01 DIAGNOSIS — I272 Pulmonary hypertension, unspecified: Secondary | ICD-10-CM | POA: Diagnosis not present

## 2020-05-01 DIAGNOSIS — Z79899 Other long term (current) drug therapy: Secondary | ICD-10-CM | POA: Diagnosis not present

## 2020-05-01 DIAGNOSIS — E785 Hyperlipidemia, unspecified: Secondary | ICD-10-CM | POA: Diagnosis not present

## 2020-05-01 DIAGNOSIS — R079 Chest pain, unspecified: Secondary | ICD-10-CM | POA: Diagnosis not present

## 2020-05-01 DIAGNOSIS — Z955 Presence of coronary angioplasty implant and graft: Secondary | ICD-10-CM | POA: Insufficient documentation

## 2020-05-01 DIAGNOSIS — Z7982 Long term (current) use of aspirin: Secondary | ICD-10-CM | POA: Insufficient documentation

## 2020-05-01 DIAGNOSIS — I25118 Atherosclerotic heart disease of native coronary artery with other forms of angina pectoris: Secondary | ICD-10-CM | POA: Diagnosis not present

## 2020-05-01 DIAGNOSIS — R0609 Other forms of dyspnea: Secondary | ICD-10-CM | POA: Insufficient documentation

## 2020-05-01 DIAGNOSIS — Z888 Allergy status to other drugs, medicaments and biological substances status: Secondary | ICD-10-CM | POA: Diagnosis not present

## 2020-05-01 HISTORY — PX: RIGHT AND LEFT HEART CATH: CATH118262

## 2020-05-01 LAB — COMPREHENSIVE METABOLIC PANEL
ALT: 15 U/L (ref 0–44)
AST: 20 U/L (ref 15–41)
Albumin: 3.8 g/dL (ref 3.5–5.0)
Alkaline Phosphatase: 79 U/L (ref 38–126)
Anion gap: 7 (ref 5–15)
BUN: 18 mg/dL (ref 8–23)
CO2: 28 mmol/L (ref 22–32)
Calcium: 8.9 mg/dL (ref 8.9–10.3)
Chloride: 104 mmol/L (ref 98–111)
Creatinine, Ser: 1.2 mg/dL (ref 0.61–1.24)
GFR, Estimated: >60 mL/min (ref >60)
Glucose, Bld: 111 mg/dL — ABNORMAL HIGH (ref 70–99)
Potassium: 4.8 mmol/L (ref 3.5–5.1)
Sodium: 139 mmol/L (ref 135–145)
Total Bilirubin: 0.7 mg/dL (ref 0.3–1.2)
Total Protein: 6.8 g/dL (ref 6.5–8.1)

## 2020-05-01 SURGERY — RIGHT AND LEFT HEART CATH
Anesthesia: Moderate Sedation | Laterality: Left

## 2020-05-01 MED ORDER — HEPARIN (PORCINE) IN NACL 1000-0.9 UT/500ML-% IV SOLN
INTRAVENOUS | Status: AC
  Filled 2020-05-01: qty 1000

## 2020-05-01 MED ORDER — SODIUM CHLORIDE 0.9 % WEIGHT BASED INFUSION: 1.0000 mL/kg/h | INTRAVENOUS | Status: DC

## 2020-05-01 MED ORDER — ACETAMINOPHEN 325 MG PO TABS: 650.0000 mg | ORAL_TABLET | ORAL | Status: DC | PRN

## 2020-05-01 MED ORDER — MIDAZOLAM HCL 2 MG/2ML IJ SOLN
INTRAMUSCULAR | Status: AC
  Filled 2020-05-01: qty 2

## 2020-05-01 MED ORDER — SODIUM CHLORIDE 0.9% FLUSH: 3.0000 mL | Freq: Two times a day (BID) | INTRAVENOUS | Status: DC

## 2020-05-01 MED ORDER — ASPIRIN 81 MG PO CHEW: 81.0000 mg | CHEWABLE_TABLET | ORAL | Status: DC

## 2020-05-01 MED ORDER — FENTANYL CITRATE (PF) 100 MCG/2ML IJ SOLN
INTRAMUSCULAR | Status: DC | PRN
  Administered 2020-05-01: 50 ug via INTRAVENOUS

## 2020-05-01 MED ORDER — DIPHENHYDRAMINE HCL 50 MG/ML IJ SOLN
INTRAMUSCULAR | Status: DC | PRN
  Administered 2020-05-01: 50 mg via INTRAVENOUS

## 2020-05-01 MED ORDER — METHYLPREDNISOLONE SODIUM SUCC 125 MG IJ SOLR
INTRAMUSCULAR | Status: DC | PRN
  Administered 2020-05-01: 125 mg via INTRAVENOUS

## 2020-05-01 MED ORDER — LABETALOL HCL 5 MG/ML IV SOLN: 10.0000 mg | INTRAVENOUS | Status: DC | PRN

## 2020-05-01 MED ORDER — HYDRALAZINE HCL 20 MG/ML IJ SOLN: 10.0000 mg | INTRAMUSCULAR | Status: DC | PRN

## 2020-05-01 MED ORDER — SODIUM CHLORIDE 0.9 % WEIGHT BASED INFUSION: 3.0000 mL/kg/h | INTRAVENOUS | Status: AC

## 2020-05-01 MED ORDER — SODIUM CHLORIDE 0.9 % IV SOLN: 250.0000 mL | INTRAVENOUS | Status: DC | PRN

## 2020-05-01 MED ORDER — HEPARIN (PORCINE) IN NACL 1000-0.9 UT/500ML-% IV SOLN
INTRAVENOUS | Status: DC | PRN
  Administered 2020-05-01: 500 mL

## 2020-05-01 MED ORDER — LIDOCAINE HCL (PF) 1 % IJ SOLN
INTRAMUSCULAR | Status: AC
  Filled 2020-05-01: qty 30

## 2020-05-01 MED ORDER — SODIUM CHLORIDE 0.9 % WEIGHT BASED INFUSION
3.0000 mL/kg/h | INTRAVENOUS | Status: AC
  Administered 2020-05-01: 3 mL/kg/h via INTRAVENOUS

## 2020-05-01 MED ORDER — SODIUM CHLORIDE 0.9% FLUSH: 3.0000 mL | INTRAVENOUS | Status: DC | PRN

## 2020-05-01 MED ORDER — ASPIRIN 81 MG PO CHEW
CHEWABLE_TABLET | ORAL | Status: AC
  Filled 2020-05-01: qty 1

## 2020-05-01 MED ORDER — METHYLPREDNISOLONE SODIUM SUCC 125 MG IJ SOLR
INTRAMUSCULAR | Status: AC
  Filled 2020-05-01: qty 2

## 2020-05-01 MED ORDER — FENTANYL CITRATE (PF) 100 MCG/2ML IJ SOLN
INTRAMUSCULAR | Status: AC
  Filled 2020-05-01: qty 2

## 2020-05-01 MED ORDER — ONDANSETRON HCL 4 MG/2ML IJ SOLN: 4.0000 mg | Freq: Four times a day (QID) | INTRAMUSCULAR | Status: DC | PRN

## 2020-05-01 MED ORDER — IOHEXOL 300 MG/ML  SOLN
INTRAMUSCULAR | Status: DC | PRN
  Administered 2020-05-01: 55 mL

## 2020-05-01 MED ORDER — LIDOCAINE HCL (PF) 1 % IJ SOLN
INTRAMUSCULAR | Status: DC | PRN
  Administered 2020-05-01: 30 mL

## 2020-05-01 MED ORDER — MIDAZOLAM HCL 2 MG/2ML IJ SOLN
INTRAMUSCULAR | Status: DC | PRN
  Administered 2020-05-01: 1 mg via INTRAVENOUS

## 2020-05-01 MED ORDER — DIPHENHYDRAMINE HCL 50 MG/ML IJ SOLN
INTRAMUSCULAR | Status: AC
  Filled 2020-05-01: qty 1

## 2020-05-01 SURGICAL SUPPLY — 12 items
CATH INFINITI 5FR JL4 (CATHETERS) ×3 IMPLANT
CATH INFINITI JR4 5F (CATHETERS) ×3 IMPLANT
CATH SWAN GANZ 7F STRAIGHT (CATHETERS) ×3 IMPLANT
DEVICE CLOSURE MYNXGRIP 5F (Vascular Products) ×3 IMPLANT
DEVICE CLOSURE MYNXGRIP 6/7F (Vascular Products) ×3 IMPLANT
GUIDEWIRE EMER 3M J .025X150CM (WIRE) ×3 IMPLANT
KIT MANI 3VAL PERCEP (MISCELLANEOUS) ×3 IMPLANT
NEEDLE PERC 18GX7CM (NEEDLE) ×3 IMPLANT
PACK CARDIAC CATH (CUSTOM PROCEDURE TRAY) ×3 IMPLANT
SHEATH AVANTI 5FR X 11CM (SHEATH) ×3 IMPLANT
SHEATH AVANTI 7FRX11 (SHEATH) ×3 IMPLANT
WIRE GUIDERIGHT .035X150 (WIRE) ×3 IMPLANT

## 2020-05-01 NOTE — Discharge Instructions (Signed)
Angiogram, Care After This sheet gives you information about how to care for yourself after your procedure. Your health care provider may also give you more specific instructions. If you have problems or questions, contact your health care provider. What can I expect after the procedure? After the procedure, it is common to have bruising and tenderness at the catheter insertion area. Follow these instructions at home: Insertion site care  Follow instructions from your health care provider about how to take care of your insertion site. Make sure you: ? Wash your hands with soap and water before you change your bandage (dressing). If soap and water are not available, use hand sanitizer. ? Change your dressing as told by your health care provider. ? Leave stitches (sutures), skin glue, or adhesive strips in place. These skin closures may need to stay in place for 2 weeks or longer. If adhesive strip edges start to loosen and curl up, you may trim the loose edges. Do not remove adhesive strips completely unless your health care provider tells you to do that.  Do not take baths, swim, or use a hot tub until your health care provider approves.  You may shower 24-48 hours after the procedure or as told by your health care provider. ? Gently wash the site with plain soap and water. ? Pat the area dry with a clean towel. ? Do not rub the site. This may cause bleeding.  Do not apply powder or lotion to the site. Keep the site clean and dry.  Check your insertion site every day for signs of infection. Check for: ? Redness, swelling, or pain. ? Fluid or blood. ? Warmth. ? Pus or a bad smell. Activity  Rest as told by your health care provider, usually for 1-2 days.  Do not lift anything that is heavier than 10 lbs. (4.5 kg) or as told by your health care provider.  Do not drive for 24 hours if you were given a medicine to help you relax (sedative).  Do not drive or use heavy machinery while  taking prescription pain medicine. General instructions   Return to your normal activities as told by your health care provider, usually in about a week. Ask your health care provider what activities are safe for you.  If the catheter site starts bleeding, lie flat and put pressure on the site. If the bleeding does not stop, get help right away. This is a medical emergency.  Drink enough fluid to keep your urine clear or pale yellow. This helps flush the contrast dye from your body.  Take over-the-counter and prescription medicines only as told by your health care provider.  Keep all follow-up visits as told by your health care provider. This is important. Contact a health care provider if:  You have a fever or chills.  You have redness, swelling, or pain around your insertion site.  You have fluid or blood coming from your insertion site.  The insertion site feels warm to the touch.  You have pus or a bad smell coming from your insertion site.  You have bruising around the insertion site.  You notice blood collecting in the tissue around the catheter site (hematoma). The hematoma may be painful to the touch. Get help right away if:  You have severe pain at the catheter insertion area.  The catheter insertion area swells very fast.  The catheter insertion area is bleeding, and the bleeding does not stop when you hold steady pressure on the area.    The area near or just beyond the catheter insertion site becomes pale, cool, tingly, or numb. These symptoms may represent a serious problem that is an emergency. Do not wait to see if the symptoms will go away. Get medical help right away. Call your local emergency services (911 in the [place] Do not drive yourself to the hospital. Summary  After the procedure, it is common to have bruising and tenderness at the catheter insertion area.  After the procedure, it is important to rest and drink plenty of fluids.  Do not take baths,  swim, or use a hot tub until your health care provider says it is okay to do so. You may shower 24-48 hours after the procedure or as told by your health care provider.  If the catheter site starts bleeding, lie flat and put pressure on the site. If the bleeding does not stop, get help right away. This is a medical emergency. This information is not intended to replace advice given to you by your health care provider. Make sure you discuss any questions you have with your health care provider. Document Revised: 05/08/2017 Document Reviewed: 04/30/2016 Elsevier Patient Education  2020 Elsevier Inc.  

## 2020-06-05 ENCOUNTER — Other Ambulatory Visit: Payer: Self-pay | Admitting: Neurology

## 2020-06-05 DIAGNOSIS — R278 Other lack of coordination: Secondary | ICD-10-CM

## 2020-06-05 DIAGNOSIS — R4189 Other symptoms and signs involving cognitive functions and awareness: Secondary | ICD-10-CM

## 2020-06-05 DIAGNOSIS — R0602 Shortness of breath: Secondary | ICD-10-CM

## 2020-06-05 DIAGNOSIS — G253 Myoclonus: Secondary | ICD-10-CM

## 2020-06-18 ENCOUNTER — Other Ambulatory Visit: Payer: Self-pay

## 2020-06-18 ENCOUNTER — Other Ambulatory Visit
Admission: RE | Admit: 2020-06-18 | Discharge: 2020-06-18 | Disposition: A | Discharge status: Home / Self Care | Payer: Medicare PPO | Source: Ambulatory Visit | Attending: Specialist | Admitting: Specialist

## 2020-06-18 DIAGNOSIS — Z01812 Encounter for preprocedural laboratory examination: Secondary | ICD-10-CM | POA: Insufficient documentation

## 2020-06-18 DIAGNOSIS — Z20822 Contact with and (suspected) exposure to covid-19: Secondary | ICD-10-CM | POA: Diagnosis not present

## 2020-06-18 LAB — SARS CORONAVIRUS 2 (TAT 6-24 HRS): SARS Coronavirus 2: NEGATIVE

## 2020-06-19 ENCOUNTER — Other Ambulatory Visit: Payer: Self-pay

## 2020-06-19 ENCOUNTER — Ambulatory Visit: Payer: Medicare PPO | Attending: Neurology

## 2020-06-19 DIAGNOSIS — G253 Myoclonus: Secondary | ICD-10-CM | POA: Diagnosis not present

## 2020-06-19 DIAGNOSIS — R4189 Other symptoms and signs involving cognitive functions and awareness: Secondary | ICD-10-CM | POA: Diagnosis not present

## 2020-06-19 DIAGNOSIS — R0602 Shortness of breath: Secondary | ICD-10-CM | POA: Diagnosis present

## 2020-06-19 DIAGNOSIS — R278 Other lack of coordination: Secondary | ICD-10-CM | POA: Diagnosis not present

## 2020-07-25 LAB — BLOOD GAS, ARTERIAL
Acid-Base Excess: 1.9 mmol/L (ref 0.0–2.0)
Bicarbonate: 28.2 mmol/L — ABNORMAL HIGH (ref 20.0–28.0)
FIO2: 0.21
O2 Saturation: 92.2 %
Patient temperature: 37
pCO2 arterial: 50 mmHg — ABNORMAL HIGH (ref 32.0–48.0)
pH, Arterial: 7.36 (ref 7.350–7.450)
pO2, Arterial: 67 mmHg — ABNORMAL LOW (ref 83.0–108.0)

## 2021-04-05 ENCOUNTER — Other Ambulatory Visit (INDEPENDENT_AMBULATORY_CARE_PROVIDER_SITE_OTHER): Payer: Self-pay | Admitting: Nurse Practitioner

## 2021-04-05 DIAGNOSIS — R0989 Other specified symptoms and signs involving the circulatory and respiratory systems: Secondary | ICD-10-CM

## 2021-04-08 ENCOUNTER — Encounter (INDEPENDENT_AMBULATORY_CARE_PROVIDER_SITE_OTHER): Payer: Self-pay | Admitting: Vascular Surgery

## 2021-04-08 ENCOUNTER — Other Ambulatory Visit: Payer: Self-pay

## 2021-04-08 ENCOUNTER — Ambulatory Visit (INDEPENDENT_AMBULATORY_CARE_PROVIDER_SITE_OTHER): Payer: Medicare PPO

## 2021-04-08 ENCOUNTER — Ambulatory Visit (INDEPENDENT_AMBULATORY_CARE_PROVIDER_SITE_OTHER): Payer: Medicare PPO | Admitting: Vascular Surgery

## 2021-04-08 DIAGNOSIS — I739 Peripheral vascular disease, unspecified: Secondary | ICD-10-CM | POA: Insufficient documentation

## 2021-04-08 DIAGNOSIS — M79606 Pain in leg, unspecified: Secondary | ICD-10-CM | POA: Insufficient documentation

## 2021-04-08 DIAGNOSIS — R0989 Other specified symptoms and signs involving the circulatory and respiratory systems: Secondary | ICD-10-CM

## 2021-04-08 DIAGNOSIS — M79604 Pain in right leg: Secondary | ICD-10-CM

## 2021-04-08 DIAGNOSIS — I2510 Atherosclerotic heart disease of native coronary artery without angina pectoris: Secondary | ICD-10-CM | POA: Insufficient documentation

## 2021-04-08 DIAGNOSIS — I10 Essential (primary) hypertension: Secondary | ICD-10-CM | POA: Diagnosis not present

## 2021-04-08 DIAGNOSIS — E782 Mixed hyperlipidemia: Secondary | ICD-10-CM | POA: Diagnosis not present

## 2021-04-08 DIAGNOSIS — M79605 Pain in left leg: Secondary | ICD-10-CM

## 2021-04-08 DIAGNOSIS — E785 Hyperlipidemia, unspecified: Secondary | ICD-10-CM | POA: Insufficient documentation

## 2021-04-08 HISTORY — DX: Essential (primary) hypertension: I10

## 2021-04-08 HISTORY — DX: Peripheral vascular disease, unspecified: I73.9

## 2021-04-08 NOTE — Progress Notes (Signed)
MRN : [id number]  Brian Kramer is a 75 y.o. (03-May-1946) male who presents with chief complaint of check circulation.  History of Present Illness:    The patient is seen for evaluation of painful lower extremities. Patient notes the pain is variable and not always associated with activity.  The pain is somewhat consistent day to day occurring on most days. The patient notes the pain also occurs with standing and routinely seems worse as the day wears on. The pain has been progressive over the past several years. The patient states these symptoms are causing  a profound negative impact on quality of life and daily activities.  The patient denies rest pain or dangling of an extremity off the side of the bed during the night for relief. No open wounds or sores at this time. No history of DVT or phlebitis. No prior interventions or surgeries.  There is a  history of back problems and DJD of the lumbar and sacral spine.  He has had at least one epidural injection but this was many years ago.  ABI's are normal bilaterally with triphasic Doppler signals in the PT and DP bilaterally   No outpatient medications have been marked as taking for the 04/08/21 encounter (Appointment) with Gilda Crease, Latina Craver, MD.    Past Medical History:  Diagnosis Date   Anginal pain ([facility])    rare   Arthritis    Coronary artery disease    Diabetes mellitus without complication ([facility])    no meds or treatment since bariatric surgery in 2017   GERD (gastroesophageal reflux disease)    NOT since gastric sleeve   Headache    has resolved for the most part   History of hiatal hernia 2004   repaired    Insomnia    Myocardial infarction ([facility]) 1996   Peripheral neuropathy    neuropathy   PUD (peptic ulcer disease)    RLS (restless legs syndrome)    Sleep apnea    no osa now since gastric sleeve.  no cpap    Past Surgical History:  Procedure Laterality Date   arthroscpic knee 2005 Right    Cardiac  Stent 1997     Cardiac Stent 2000     CARPOMETACARPAL [facility]) FUSION OF THUMB Right 04/08/2018   Procedure: CARPOMETACARPAL [facility]) FUSION OF THUMB;  Surgeon: Kennedy Bucker, MD;  Location: [facility] ORS;  Service: Orthopedics;  Laterality: Right;   CARPOMETACARPAL ([facility]) FUSION OF THUMB Left 12/02/2018   Procedure: CARPOMETACARPAL ([facility]) FUSION OF  LEFT THUMB, DIABETIC;  Surgeon: Kennedy Bucker, MD;  Location: [facility] ORS;  Service: Orthopedics;  Laterality: Left;   COLONOSCOPY     COLONOSCOPY WITH PROPOFOL N/A 05/19/2018   Procedure: COLONOSCOPY WITH PROPOFOL;  Surgeon: Toledo, Boykin Nearing, MD;  Location: [facility] ENDOSCOPY;  Service: Gastroenterology;  Laterality: N/A;   CORONARY ANGIOPLASTY     2 stents   EYE SURGERY  2015   Bilateral cataract   Gastric Sleeve Bariatric Surgery  2017   HAMMER TOE SURGERY Right 01/15/2018   Procedure: HAMMER TOE CORRECTION-2ND & 3RD;  Surgeon: Recardo Evangelist, DPM;  Location: [facility] ORS;  Service: Podiatry;  Laterality: Right;   HERNIA REPAIR Bilateral    inguinal   JOINT REPLACEMENT  2007   Left knee   JOINT REPLACEMENT  2015   Right Knee   left knee arthroscopy 2006 Left    RIGHT AND LEFT HEART CATH Left 05/01/2020   Procedure: RIGHT AND LEFT HEART CATH;  Surgeon: Juliann Pares,  Bobbie Stack, MD;  Location: [facility] INVASIVE CV LAB;  Service: Cardiovascular;  Laterality: Left;   Right Heel Spur Resection 2012     Right shoulder AC separation surgery in 1995 Right    Stem Cells to left heel  2017   TONSILLECTOMY     ULNAR TUNNEL RELEASE Left 04/14/2019   Procedure: LEFT CUBITAL TUNNEL RELEASE;  Surgeon: Kennedy Bucker, MD;  Location: [facility] ORS;  Service: Orthopedics;  Laterality: Left;   UPPER GI ENDOSCOPY  2017   Vasectomy 1970's     WEIL OSTEOTOMY Right 01/15/2018   Procedure: WEIL OSTEOTOMY-2ND & 3RD;  Surgeon: Recardo Evangelist, DPM;  Location: [facility] ORS;  Service: Podiatry;  Laterality: Right;    Social History Social History   Tobacco Use   Smoking status: Former    Types:  Cigarettes    Quit date: 03/09/1994    Years since quitting: 27.1   Smokeless tobacco: Never  Vaping Use   Vaping Use: Never used  Substance Use Topics   Alcohol use: Yes    Comment: Very rarely 1 drink.   Drug use: Never    Family History No family history on file.  Allergies  Allergen Reactions   Tape Other (See Comments)    Round, white EKG pads only - "ate holes in my skin" 1997 when stays on skin for longer period Any tape OK if it doesn't stay on too long   Penicillins Itching, Rash and Other (See Comments)    Has patient had a PCN reaction causing immediate rash, facial/tongue/throat swelling, SOB or lightheadedness with hypotension: Yes Has patient had a PCN reaction causing severe rash involving mucus membranes or skin necrosis: No Has patient had a PCN reaction that required hospitalization: No Has patient had a PCN reaction occurring within the last 10 years: No If all of the above answers are "NO", then may proceed with Cephalosporin use.    Shellfish Allergy Rash and Other (See Comments)    PT CAN NOT EAT CRAB or lobster, BUT CAN EAT shrimp. No problems with betadine or iodine     REVIEW OF SYSTEMS (Negative unless checked)  Constitutional: [] Weight loss  [] Fever  [] Chills Cardiac: [] Chest pain   [] Chest pressure   [] Palpitations   [] Shortness of breath when laying flat   [] Shortness of breath with exertion. Vascular:  [] Pain in legs with walking   [] Pain in legs at rest  [] History of DVT   [] Phlebitis   [] Swelling in legs   [] Varicose veins   [] Non-healing ulcers Pulmonary:   [] Uses home oxygen   [] Productive cough   [] Hemoptysis   [] Wheeze  [] COPD   [] Asthma Neurologic:  [] Dizziness   [] Seizures   [] History of stroke   [] History of TIA  [] Aphasia   [] Vissual changes   [] Weakness or numbness in arm   [] Weakness or numbness in leg Musculoskeletal:   [] Joint swelling   [] Joint pain   [] Low back pain Hematologic:  [] Easy bruising  [] Easy bleeding   [] Hypercoagulable  state   [] Anemic Gastrointestinal:  [] Diarrhea   [] Vomiting  [] Gastroesophageal reflux/heartburn   [] Difficulty swallowing. Genitourinary:  [] Chronic kidney disease   [] Difficult urination  [] Frequent urination   [] Blood in urine Skin:  [] Rashes   [] Ulcers  Psychological:  [] History of anxiety   []  History of major depression.  Physical Examination  There were no vitals filed for this visit. There is no height or weight on file to calculate BMI. Gen: WD/WN, NAD Head: [place]/AT, No temporalis wasting.  Ear/Nose/Throat:  Hearing grossly intact, nares w/o erythema or drainage Eyes: PER, EOMI, sclera nonicteric.  Neck: Supple, no masses.  No bruit or JVD.  Pulmonary:  Good air movement, no audible wheezing, no use of accessory muscles.  Cardiac: RRR, normal S1, S2, no Murmurs. Vascular:  scattered varicosities present bilaterally.  Mild venous stasis changes to the legs bilaterally.  1+ soft pitting edema  Vessel Right Left  Radial Palpable Palpable  Carotid Palpable Palpable  PT Palpable Palpable  DP Trace Palpable Trace Palpable  Gastrointestinal: soft, non-distended. No guarding/no peritoneal signs.  Musculoskeletal: M/S 5/5 throughout.  No visible deformity.  Neurologic: CN 2-12 intact. Pain and light touch intact in extremities.  Symmetrical.  Speech is fluent. Motor exam as listed above. Psychiatric: Judgment intact, Mood & affect appropriate for pt's clinical situation. Dermatologic: No rashes or ulcers noted.  No changes consistent with cellulitis.   CBC Lab Results  Component Value Date   WBC 5.9 01/12/2018   HGB 13.0 01/12/2018   HCT 38.8 (L) 01/12/2018   MCV 92.5 01/12/2018   PLT 148 (L) 01/12/2018    BMET    Component Value Date/Time   NA 139 05/01/2020 0949   K 4.8 05/01/2020 0949   CL 104 05/01/2020 0949   CO2 28 05/01/2020 0949   GLUCOSE 111 (H) 05/01/2020 0949   BUN 18 05/01/2020 0949   CREATININE 1.20 05/01/2020 0949   CALCIUM 8.9 05/01/2020 0949   GFRNONAA  >60 05/01/2020 0949   GFRAA >60 01/12/2018 1450   CrCl cannot be calculated (Patient's most recent lab result is older than the maximum 21 days allowed.).  COAG No results found for: INR, PROTIME  Radiology No results found.   Assessment/Plan 1. Pain in both lower extremities Recommend:  I do not find evidence of Vascular pathology that would explain the patient's symptoms  The patient has atypical pain symptoms for vascular disease  I do not find evidence of Vascular pathology that would explain the patient's symptoms and I suspect the patient is c/o pseudoclaudication.  Patient should have an evaluation of his LS spine which I defer to the Spine service.  Noninvasive studies of the legs do not identify significant vascular problems  The patient should continue walking and begin a more formal exercise program. The patient should continue his antiplatelet therapy and aggressive treatment of the lipid abnormalities. The patient should begin wearing graduated compression socks 15-20 mmHg strength to control her mild edema.  Patient will follow-up with me on a PRN basis  Further work-up of her lower extremity pain is deferred to the Spine service     - Ambulatory referral to Neurosurgery  2. Essential hypertension Continue antihypertensive medications as already ordered, these medications have been reviewed and there are no changes at this time.   3. Coronary artery disease involving native coronary artery of native heart, unspecified whether angina present Continue cardiac and antihypertensive medications as already ordered and reviewed, no changes at this time.  Continue statin as ordered and reviewed, no changes at this time  Nitrates PRN for chest pain   4. Mixed hyperlipidemia Continue statin as ordered and reviewed, no changes at this time    Levora Dredge, MD  04/08/2021 8:55 AM

## 2021-05-27 ENCOUNTER — Other Ambulatory Visit: Payer: Self-pay | Admitting: Neurosurgery

## 2021-05-27 DIAGNOSIS — M545 Low back pain, unspecified: Secondary | ICD-10-CM

## 2021-05-27 DIAGNOSIS — M5136 Other intervertebral disc degeneration, lumbar region: Secondary | ICD-10-CM

## 2021-05-27 DIAGNOSIS — G6289 Other specified polyneuropathies: Secondary | ICD-10-CM

## 2021-06-17 ENCOUNTER — Ambulatory Visit
Admission: RE | Admit: 2021-06-17 | Discharge: 2021-06-17 | Disposition: A | Discharge status: Home / Self Care | Payer: Medicare PPO | Source: Ambulatory Visit | Attending: Neurosurgery | Admitting: Neurosurgery

## 2021-06-17 ENCOUNTER — Other Ambulatory Visit: Payer: Self-pay

## 2021-06-17 DIAGNOSIS — M5136 Other intervertebral disc degeneration, lumbar region: Secondary | ICD-10-CM | POA: Insufficient documentation

## 2021-06-17 DIAGNOSIS — M545 Low back pain, unspecified: Secondary | ICD-10-CM | POA: Insufficient documentation

## 2021-06-17 DIAGNOSIS — G8929 Other chronic pain: Secondary | ICD-10-CM | POA: Diagnosis present

## 2021-06-17 DIAGNOSIS — G6289 Other specified polyneuropathies: Secondary | ICD-10-CM | POA: Diagnosis present

## 2021-09-05 DIAGNOSIS — I5189 Other ill-defined heart diseases: Secondary | ICD-10-CM

## 2021-09-05 HISTORY — DX: Other ill-defined heart diseases: I51.89

## 2021-09-12 ENCOUNTER — Other Ambulatory Visit: Payer: TRICARE For Life (TFL)

## 2021-09-24 ENCOUNTER — Other Ambulatory Visit: Payer: Self-pay | Admitting: Podiatry

## 2021-09-24 ENCOUNTER — Other Ambulatory Visit: Payer: Self-pay

## 2021-09-24 ENCOUNTER — Encounter
Admission: RE | Admit: 2021-09-24 | Discharge: 2021-09-24 | Disposition: A | Discharge status: Home / Self Care | Payer: TRICARE For Life (TFL) | Source: Ambulatory Visit | Attending: Podiatry | Admitting: Podiatry

## 2021-09-24 VITALS — Ht 69.0 in | Wt 265.0 lb

## 2021-09-24 DIAGNOSIS — E875 Hyperkalemia: Secondary | ICD-10-CM

## 2021-09-24 HISTORY — DX: Pulmonary hypertension, unspecified: I27.20

## 2021-09-24 NOTE — Pre-Procedure Instructions (Signed)
Copied and pasted from 09/24/21 Dr.Herbon Meredeth Ide office visit. ? ? ?He does have pulmonary hypertension, moderate obstruction. Over weight, activity is limited as a result. Desat with activity. On oxygen. Suspect he may also have sleep apnea. All of which are risk factors for pulmonary hypertension. On letaris 5 mg q day. Cleared for the foot ( left) surgery.  ?

## 2021-09-24 NOTE — Patient Instructions (Addendum)
Your procedure is scheduled on: Friday 10/04/21 ?Report to the Registration Desk on the 1st floor of the [facility]. ?To find out your arrival time, please call [phone] between 1PM - 3PM on: Thursday 10/03/21 ? ?REMEMBER: ?Instructions that are not followed completely may result in serious medical risk, up to and including death; or upon the discretion of your surgeon and anesthesiologist your surgery may need to be rescheduled. ? ?Do not eat any food after midnight the night before surgery.  ?No gum chewing, lozengers or hard candies. ? ?You may however, drink CLEAR liquids up to 2 hours before you are scheduled to arrive for your surgery. Do not drink anything within 2 hours of your scheduled arrival time. ? ?Clear liquids include: ?-water ?-apple juice without pulp ?-gatorade (not red colors) ?-black coffee or tea (Do not add milk or creamers to the coffee or tea) ?Do NOT drink anything that is not on this list. ? ?In addition. Your doctor has ordered for you to drink the provided ?Ensure Pre-Surgery Clear Carbohydrate Drink ?Drinking this carbohydrate drink up to two hours before surgery helps to reduce insulin resistance and improve patient outcome. Please complete drinking 2 hours prior to scheduled arrival time. ? ?TAKE THESE MEDICATIONS THE MORNING OF SURGERY WITH A SIP OF WATER: ?ambrisentan (LETAIRIS) 5 MG tablet ?DULoxetine HCl 30 MG CSDR ?LYRICA 200 MG capsule ? ? ?Use Fluticasone-Umeclidin-Vilant (TRELEGY ELLIPTA) 100-62.5-25 MCG/ACT AEPB inhalers on the day of surgery and bring to the hospital. ? ?Hold Aspirin for 5 days prior to surgery. Last dose on Saturday 4/22. ?  ?One week prior to surgery: ?Stop Anti-inflammatories (NSAIDS) such as Advil, Aleve, Ibuprofen, Motrin, Naproxen, Naprosyn and Aspirin based products such as Excedrin, Goodys Powder, BC Powder. ? ?Stop taking your Multiple Vitamins-Minerals (BARIATRIC MULTIVITAMINS/IRON PO, Calcium 500-100 MG-UNIT CHEW, ferrous sulfate 325 (65 FE)  MG tablet, ANY OVER THE COUNTER supplements until after surgery. ? ?You may however, continue to take Tylenol if needed for pain up until the day of surgery. ? ?No Alcohol for 24 hours before or after surgery. ? ?No Smoking including e-cigarettes for 24 hours prior to surgery.  ?No chewable tobacco products for at least 6 hours prior to surgery.  ?No nicotine patches on the day of surgery. ? ?Do not use any "recreational" drugs for at least a week prior to your surgery.  ?Please be advised that the combination of cocaine and anesthesia may have negative outcomes, up to and including death. ?If you test positive for cocaine, your surgery will be cancelled. ? ?On the morning of surgery brush your teeth with toothpaste and water, you may rinse your mouth with mouthwash if you wish. ?Do not swallow any toothpaste or mouthwash. ? ?Use CHG Soap as directed on instruction sheet. ? ?Do not wear jewelry. ? ?Do not wear lotions, powders, or colognes.  ? ?Do not shave body from the neck down 48 hours prior to surgery just in case you cut yourself which could leave a site for infection.  ?Also, freshly shaved skin may become irritated if using the CHG soap. ? ?Do not bring valuables to the hospital. [facility] is not responsible for any missing/lost belongings or valuables.  ? ?Notify your doctor if there is any change in your medical condition (cold, fever, infection). ? ?Wear comfortable clothing (specific to your surgery type) to the hospital. ? ?If you are being discharged the day of surgery, you will not be allowed to drive home. ?You will need a responsible  adult (18 years or older) to drive you home and stay with you that night.  ? ?If you are taking public transportation, you will need to have a responsible adult (18 years or older) with you. ?Please confirm with your physician that it is acceptable to use public transportation.  ? ?Please call the [facility] at [phone] if you have any  questions about these instructions. ? ?Surgery Visitation Policy: ? ?Patients undergoing a surgery or procedure may have two family members or support persons with them as long as the person is not COVID-19 positive or experiencing its symptoms.  ? ?Inpatient Visitation:   ? ?Visiting hours are 7 a.m. to 8 p.m. ?Up to four visitors are allowed at one time in a patient room, including children. The visitors may rotate out with other people during the day. One designated support person (adult) may remain overnight.  ?

## 2021-09-24 NOTE — Pre-Procedure Instructions (Signed)
Copied and pasted from 09/12/21 Delicia White's(NP) office visit. ? ? ? ?8. Preoperative clearance ?The patient is cleared for phalanx surgery scheduled for 10/04/2021 as a moderate risk for cardiovascular complications. He has been optimized for surgery. ? ?Return in about 3 months (around 12/12/2021). Discussed return precautions with patient for acute and chronic cardiovascular issues. ? ?Attestation Statement:  ? ?I personally performed the service, non-incident to. [facility])  ? ?DELICIA MARIA WHITE, NP ?

## 2021-09-26 ENCOUNTER — Other Ambulatory Visit
Admission: RE | Admit: 2021-09-26 | Discharge: 2021-09-26 | Disposition: A | Discharge status: Home / Self Care | Payer: Medicare PPO | Source: Ambulatory Visit | Attending: Podiatry | Admitting: Podiatry

## 2021-09-26 DIAGNOSIS — G4733 Obstructive sleep apnea (adult) (pediatric): Secondary | ICD-10-CM | POA: Diagnosis not present

## 2021-09-26 DIAGNOSIS — Z9981 Dependence on supplemental oxygen: Secondary | ICD-10-CM | POA: Insufficient documentation

## 2021-09-26 DIAGNOSIS — E875 Hyperkalemia: Secondary | ICD-10-CM | POA: Diagnosis not present

## 2021-09-26 DIAGNOSIS — J449 Chronic obstructive pulmonary disease, unspecified: Secondary | ICD-10-CM | POA: Diagnosis not present

## 2021-09-26 DIAGNOSIS — I272 Pulmonary hypertension, unspecified: Secondary | ICD-10-CM | POA: Diagnosis not present

## 2021-09-26 DIAGNOSIS — I071 Rheumatic tricuspid insufficiency: Secondary | ICD-10-CM | POA: Insufficient documentation

## 2021-09-26 DIAGNOSIS — I5020 Unspecified systolic (congestive) heart failure: Secondary | ICD-10-CM | POA: Diagnosis not present

## 2021-09-26 DIAGNOSIS — Z9884 Bariatric surgery status: Secondary | ICD-10-CM | POA: Diagnosis not present

## 2021-09-26 DIAGNOSIS — Z01818 Encounter for other preprocedural examination: Secondary | ICD-10-CM | POA: Insufficient documentation

## 2021-09-26 DIAGNOSIS — Z87891 Personal history of nicotine dependence: Secondary | ICD-10-CM | POA: Diagnosis not present

## 2021-09-26 LAB — POTASSIUM: Potassium: 5 mmol/L (ref 3.5–5.1)

## 2021-09-26 NOTE — Pre-Procedure Instructions (Signed)
Patient Instructions ?- documented in this encounter ?Table of Contents for Patient Instructions  ?Patient Instructions  ?Attachments  ?  ?Patient Instructions ?Maryellen PileWhite, Delicia Maria, NP - 09/12/2021 8:00 AM EDT   ?Formatting of this note might be different from the original. ?Please hold Aspirin 5 days prior to surgery and restart 2 days after surgery.  ?Electronically signed by Maryellen PileWhite, Delicia Maria, NP at 09/12/2021 8:34 AM EDT ?  ?Back to top of Patient Instructions ?Attachments ?The following attachments cannot be sent through Care Everywhere.   ?Healthy Diet: Heart (English) ?Fall Prevention (English) ?Back to top of Patient Instructions ?Progress Notes ?- documented in this encounter ?Maryellen PileWhite, Delicia Maria, NP - 09/12/2021 8:00 AM EDT ?Formatting of this note is different from the original. ?Established Patient Visit  ? ?Chief Complaint: ?Chief Complaint  ?Patient presents with  ? followup echo  ?Date of Service: 09/12/2021 ?Date of Birth: 10-26-1945 ?PCP: Marisue IvanLinthavong, [place], MD  ?History of Present Illness: Mr. Samuella Cotarice is a 76 y.o.male patient with PMH significant for CAD, s/p PCI/DES proximal Circumflex, hyperkalemia, hyperlipidemia, diabetes mellitus type 2, renal insufficiency, COPD, mild pulmonary hypertension, cognitive impairment, neuropathy, RLS, anemia, thrombocytopenia, obesity, s/p laparoscopic sleeve gastrectomy, osteoarthritis and depression who presents today for follow-up from recent echocardiogram and requesting preoperative clearance prior to surgical repair of the phalanx. ? ?The echocardiogram from 09/05/2021 revealed normal LV & RV systolic function with estimated EF of 50-55%, mild tricuspid and mitral valve insufficiency, mild RV & biatrial enlargement and mild pulmonary hypertension.  ? ?Today the patient is feeling fairly well on today he presents without any acute cardiac complaints. He continues to have chronic dyspnea without worsening and chronic, intermittent, left-sided chest pain that is  also without worsening. The patient describes the chest pain as a "dull, grasping" pain that occurs randomly while at rest. The pain can last anywhere between "15 minutes to 2 hours;" however, he attributes this pain to his muscles and pulmonary hypertension. Furthermore, he states that he manages the pain by placing a heating pad to the site which resolves the pain completely in "less than 15 minutes."  ? ?He endorses postural dizziness that occurs with swift positional changes, in addition to his unsteady gait in which he attributes to the complications with his feet. He is scheduled for foot surgery on 4/28. He is going for the left, but also has to have the right foot done in the future.  ? ?Significant visit summaries/events: ?(08/13/2021)-the patient was seen for 5655-month routine follow-up visit with complaints of intermittent chest pain, chronic dyspnea and unsteady gait. At that time the patient was scheduled for an echocardiogram for further evaluation. ? ?Past Medical and Surgical History  ?Past Medical History ?Past Medical History:  ?Diagnosis Date  ? Allergy  ?Penicillin,shellfish,some adhesives  ? Coronary atherosclerosis  ? Diabetes mellitus type 2, uncomplicated (CMS-HCC)  ? Essential hypertension  ? History of cataract  ? History of MI (myocardial infarction) 03/1995  ? History of myocardial infarction 03/1995  ? Hyperlipidemia  ? Insomnia  ? Migraine headache  ? Moderate major depression (CMS-HCC)  ? Neuropathy  ? Obesity  ? OSA (obstructive sleep apnea)  ? Osteoarthrosis involving lower leg  ? Overactive bladder  ? Presbycusis  ? PUD (peptic ulcer disease)  ? Renal insufficiency (Cr 1.3 and GFR 54 - 02/08/20) 02/17/2020  ? RLS (restless legs syndrome)  ? Thrombocytopenia (Plt 115 - 02/21/21) 02/28/2021  ? Tremor on keppra - followed by Dr. Sherryll BurgerShah 02/28/2021  ? ?Past Surgical History ?He  has a past surgical history that includes Laproscopic vertical sleeve gastropathy (01/18/2017); Bilateral knee replacement  surgeries; Right foot tendon repair; Right heel spur repair; Cataract extraction (Bilateral); Stent x 2- RCA; Hernia repair (Bilateral, 2004); Right shoulder surgery; Colonoscopy (10/28/2012); egd (01/28/2016); CARPOMETACARPAL (CMC) FUSION OF THUMB (Right) MC arthroplasty not fusion right thumb (Right, 04/08/2018); Colonoscopy (05/19/2018); CARPOMETACARPAL (CMC) FUSION OF LEFT THUMB, DIABETIC (Left) (Left, 12/02/2018); Coronary angioplasty with stent; Joint replacement; Left elbow ulnar nerve release (Left, 04/14/2019); Tonsillectomy; and Knee arthroscopy.  ? ?Medications and Allergies  ?Current Medications ? ?Current Outpatient Medications  ?Medication Sig Dispense Refill  ? albuterol 90 mcg/actuation inhaler INHALE 2 INHALATIONS INTO THE LUNGS EVERY 6 HOURS AS NEEDED FOR WHEEZING 1 each 11  ? ambrisentan (LETAIRIS) 5 MG tablet TAKE 1 TABLET (5MG ) BY MOUTH ONCE DAILY 30 tablet 4  ? aspirin 81 MG EC tablet Take 81 mg by mouth every evening  ? azelastine (ASTELIN) 137 mcg nasal spray Place 2 sprays into both nostrils 2 (two) times daily  ? calcium carbonate 500 mg calcium (1,250 mg) chewable tablet Take 1 tablet by mouth 2 (two) times daily With IRON  ? DULoxetine (CYMBALTA) 30 MG DR capsule Take 3 capsules (90 mg total) by mouth once daily 180 capsule 3  ? fluticasone-umeclidinium-vilanterol (TRELEGY ELLIPTA) 100-62.5-25 mcg inhaler Inhale 1 inhalation into the lungs once daily 1 each 6  ? ipratropium (ATROVENT) 0.03 % nasal spray Place 2 sprays into both nostrils 3 (three) times daily as needed for Rhinitis  ? lisinopriL (ZESTRIL) 5 MG tablet TAKE 1 TABLET BY MOUTH EVERY DAY 90 tablet 3  ? mometasone (ELOCON) 0.1 % lotion INSTILL 4 DROPS INTO EACH EAR EVERY NIGHT AT BEDTIME AS NEEDED FOR DRY SKIN AND ITCHING  ? multivitamin capsule Take 1 capsule by mouth once daily Bariatic multivitamin with iron- 1 tablet by mouth once daily  ? mupirocin (BACTROBAN) 2 % ointment APPLY WITH QTIP TWICE DAILY AS NEEDED FOR NASAL  DRYNESS AND INFLAMMATION AS DIRECTED  ? nitroGLYcerin (NITROSTAT) 0.4 MG SL tablet Place 1 tablet (0.4 mg total) under the tongue every 5 (five) minutes as needed for Chest pain May take up to 3 doses. 25 tablet 5  ? QUEtiapine (SEROQUEL) 100 MG tablet Take 1 tablet (100 mg total) by mouth at bedtime 90 tablet 3  ? rOPINIRole (REQUIP XL) 4 MG XL tablet Take 1 tablet (4 mg total) by mouth at bedtime 90 tablet 3  ? rosuvastatin (CRESTOR) 5 MG tablet Take 1 tablet (5 mg total) by mouth once daily 90 tablet 3  ? FUROsemide (LASIX) 20 MG tablet Take 1 tablet (20 mg total) by mouth once daily as needed for Edema 30 tablet 11  ? pregabalin (LYRICA) 200 MG capsule Take 1 capsule (200 mg total) by mouth 3 (three) times daily for 30 days 90 capsule 5  ? ?No current facility-administered medications for this visit.  ? ?Allergies: Adhesive, Penicillins, and Shellfish containing products ? ?Social and Family History  ?Social History ?reports that he quit smoking about 27 years ago. His smoking use included cigarettes. He has a 40.00 pack-year smoking history. He has been exposed to tobacco smoke. He has never used smokeless tobacco. He reports current alcohol use. He reports that he does not use drugs. ? ?Family History ?Family History  ?Problem Relation Age of Onset  ? No Known Problems Mother  ? Stroke Father  ? Asthma Daughter  ? ?Review of Systems  ? ?Review of Systems  ?Constitutional: Negative.  ?  HENT: Negative.  ?Eyes: Negative.  ?Respiratory: Positive for shortness of breath.  ?Cardiovascular: Positive for chest pain.  ?Gastrointestinal: Negative.  ?Genitourinary: Negative.  ?Musculoskeletal:  ?Unsteady gait  ?Skin: Negative.  ?Neurological: Positive for dizziness.  ?Endo/Heme/Allergies: Negative.  ?Psychiatric/Behavioral: Negative.  ?All other systems reviewed and are negative. ? ?Physical Examination  ? ?Vitals:BP 120/78  Pulse 64  Resp 16  Ht 175.3 cm (5\' 9" )  Wt (!) 120.2 kg (265 lb)  SpO2 98%  BMI 39.13 kg/m?   ?Ht:175.3 cm (5\' 9" ) Wt:(!) 120.2 kg (265 lb) surface area is 2.42 meters squared. ?Body mass index is 39.13 kg/m?. ? ?General: Alert and oriented. Well-appearing. No acute distress. ?HEENT: Pupils

## 2021-09-26 NOTE — Pre-Procedure Instructions (Signed)
Echo complete ? ?Anatomical Region Laterality Modality  ?-- -- Ultrasound  ? ?Narrative ? ?                       CARDIOLOGY DEPARTMENT                 Antolin, [facility]                                    [id number]  ?          [facility]                           Acct #: [id number]  ?          [address] [place], [place] [address]       Date: 09/05/2021 08: 69 AM  ?                                                             Adult   Male  Age: 76 yrs  ?          ECHOCARDIOGRAM REPORT                              Outpatient  ?                                                             [facility]  ?     STUDY:CHEST WALL               TAPE:0000: 00: 0: 00: 00 MD1: WHITE, [place]  ?      ECHO:Yes    DOPPLER:Yes       FILE:0000-000-000        BP: 120/88 mmHg  ?     COLOR:Yes   CONTRAST:No     MACHINE:Philips  ? RV BIOPSY:No          3D:No  SOUND QLTY:Moderate            Height: 69 in  ?    MEDIUM:None                                              Weight: 264 lb  ?                                                             BSA: 2.3 m2  ?_________________________________________________________________________________________  ?              HISTORY:  DOE  ?               REASON: Assess, LV function  ?           INDICATION: I27.20 Mild pulmonary hypertension, R06.02 Shortness of breath,  ?                       R06.09 Other forms of dyspnea  ?_________________________________________________________________________________________  ?ECHOCARDIOGRAPHIC MEASUREMENTS  ?2D DIMENSIONS  ?AORTA                  Values   Normal Range   MAIN PA         Values    Normal Range  ?              Annulus: nm*          [2.3-2.9]         PA Main: nm*       [1.5-2.1]  ?            Aorta Sin: 3.4 cm       [3.1-3.7]    RIGHT VENTRICLE  ?          ST Junction: nm*          [2.6-3.2]         RV Base: nm*       [<4.2]  ?            Asc.Aorta: nm*          [2.6-3.4]          RV Mid: 3.9 cm    [<  3.5]  ?LEFT VENTRICLE                                      RV Length: nm*       [<8.6]  ?                LVIDd: 6.3 cm       [4.2-5.9]    INFERIOR VENA CAVA  ?                LVIDs: 4.2 cm                        Max. IVC: nm*       [<=2.1]  ?                   FS: 33.3 %       [>25]            Min. IVC: nm*  ?                  SWT: 0.89 cm      [0.6-1.0]    ------------------  ?                  PWT: 0.94 cm      [0.6-1.0]    nm* - not measured  ?LEFT ATRIUM  ?              LA Diam: 4.6 cm       [3.0-4.0]  ?          LA A4C Area: nm*          [<20]  ?            LA Volume: nm*          [  18-58]  ?_________________________________________________________________________________________  ?ECHOCARDIOGRAPHIC DESCRIPTIONS  ?AORTIC ROOT  ?                 Size: Normal  ?           Dissection: INDETERM FOR DISSECTION  ?AORTIC VALVE  ?             Leaflets: Tricuspid                   Morphology: Normal  ?             Mobility: Fully mobile  ?LEFT VENTRICLE  ?                 Size: Normal                        Anterior: Normal  ?          Contraction: Normal                         Lateral: Normal  ?           Closest EF: 50% (Estimated)                 Septal: Normal  ?            LV Masses: No Masses                       Apical: Normal  ?                  LVH: None                          Inferior: Normal  ?                                                    Posterior: Normal  ?         Dias.FxClass: (Grade 1) relaxation abnormal, E/A reversal  ?MITRAL VALVE  ?             Leaflets: Normal                        Mobility: Fully mobile  ?           Morphology: ANNULAR CALC  ?LEFT ATRIUM  ?                 Size: MILDLY ENLARGED              LA Masses: No masses  ?            IA Septum: Normal IAS  ?MAIN PA  ?                 Size: Normal  ?PULMONIC VALVE  ?           Morphology: Normal                        Mobility: Fully mobile  ?RIGHT VENTRICLE  ?                 Size: MILDLY ENLARGED  Free Wall: Normal  ?           Contraction: Normal                       RV Masses: No Masses  ?TRICUSPID VALVE  ?             Leaflets: Normal                        Mobility: Fully mobile  ?           Morphology: Normal  ?RIGHT ATRIUM  ?                 Size: MILDLY ENLARGED               RA Other: None  ?              RA Mass: No masses  ?PERICARDIUM  ?                Fluid: No effusion  ?INFERIOR VENACAVA  ?                 Size: Normal Normal respiratory collapse  ?_________________________________________________________________________________________  ? ?DOPPLER ECHO and OTHER SPECIAL PROCEDURES  ?               Aortic: No AR                      No AS  ?                       188.0 cm/sec peak vel      14.1 mmHg peak grad  ?                       7.1 mmHg mean grad         2.1 cm^2 by DOPPLER  ?               Mitral: MILD MR                    No MS  ?                       MV Inflow E Vel = 117.0 cm/sec      MV Annulus E'Vel = nm*  ?                       E/E'Ratio = nm*  ?            Tricuspid: MILD TR                    No TS  ?                       333.9 cm/sec peak TR vel   49.6 mmHg peak RV pressure  ?            Pulmonary: TRIVIAL PR                 No PS  ?_________________________________________________________________________________________  ?INTERPRETATION  ?NORMAL LEFT VENTRICULAR SYSTOLIC FUNCTION WITH AN ESTIMATED EF = 50-55 %  ?NORMAL RIGHT VENTRICULAR SYSTOLIC FUNCTION  ?MILD TRICUSPID AND MITRAL VALVE INSUFFICIENCY  ?NO VALVULAR STENOSIS  ?MILD RV ENLARGEMENT  ?MILD BIATRIAL ENLARGEMENT  ?MILD PULMONARY HYPERTENSION  WITH AN RVSP = 50 mmHg)(RAP = 0-5 mmHg)  ?_________________________________________________________________________________________  ?Electronically signed by    Lujean Amel, MD on 09/06/2021 02: 29 PM  ?         Performed By: Johnathan Hausen, RDCS, RVT  ?   Ordering Physician: WHITE, DELICIA  ?_________________________________________________________________________________________ ?Procedure  Note ? ?Jobe Gibbon, MD - 09/06/2021  ?Formatting of this note might be different from the original.  ?                       [place], [place]                                    [id number]  ?          [facility]                           Acct #: [id number]  ?          [address] [place], [place] [address]       Date: 09/05/2021 08: 33 AM  ?                                                             Adult   Male  Age: 66 yrs  ?          ECHOCARDIOGRAM REPORT                              Outpatient  ?                                                             [facility]  ?     STUDY:CHEST WALL               TAPE:0000: 00: 0: 00: 00 MD1: WHITE, [place]  ?      ECHO:Yes    DOPPLER:Yes       FILE:0000-000-000        BP: 120/88 mmHg  ?     COLOR:Yes   CONTRAST:No     MACHINE:Philips  ? RV BIOPSY:No          3D:No  SOUND QLTY:Moderate            Height: 69 in  ?    MEDIUM:None                                              Weight: 264 lb  ?  BSA: 2.3 m2  ?_________________________________________________________________________________________  ?              HISTORY: DOE  ?               REASON: Assess, LV function  ?           INDICATION: I27.20 Mild pulmonary hypertension, R06.02 Shortness of breath,  ?                       R06.09 Other forms of dyspnea  ?_________________________________________________________________________________________  ?ECHOCARDIOGRAPHIC MEASUREMENTS  ?2D DIMENSIONS  ?AORTA                  Values   Normal Range   MAIN PA         Values    Normal Range  ?              Annulus: nm*          [2.3-2.9]         PA Main: nm*       [1.5-2.1]  ?            Aorta Sin: 3.4 cm       [3.1-3.7]    RIGHT VENTRICLE  ?          ST Junction: nm*          [2.6-3.2]         RV Base: nm*       [<4.2]  ?            Asc.Aorta: nm*          [2.6-3.4]           RV Mid: 3.9 cm    [< 3.5]  ?LEFT VENTRICLE                                      RV Length: nm*       [<8.6]  ?                LVIDd: 6.3 cm       [4.2-5.9]    INFERIOR VENA CAVA  ?                LVI

## 2021-09-26 NOTE — Pre-Procedure Instructions (Signed)
Progress Notes ?- documented in this encounter ?Erby Pian, MD - 09/24/2021 8:30 AM EDT ?Formatting of this note is different from the original. ? ? ?Follow-up ? ?History of Present Illness: ?Brian Kramer is a 76 y.o. male presents to clinic for recheck. Moderate copd, osa, pulmonary htn, over weigh, on oxygen. S/p gastric btpass with significant weight loss. ( down from 370 lbs). He was on bipap initially. Off it now. He toss at night but no snoring. + RLS. He is sleeping on oxygen and per family he feels much better. Most mornings he is rested. No angina, swelling.  ? ?Current Medications:  ?Current Outpatient Medications  ?Medication Sig Dispense Refill  ? albuterol 90 mcg/actuation inhaler INHALE 2 INHALATIONS INTO THE LUNGS EVERY 6 HOURS AS NEEDED FOR WHEEZING 1 each 11  ? ambrisentan (LETAIRIS) 5 MG tablet TAKE 1 TABLET (5MG ) BY MOUTH ONCE DAILY 30 tablet 4  ? aspirin 81 MG EC tablet Take 81 mg by mouth every evening  ? azelastine (ASTELIN) 137 mcg nasal spray Place 2 sprays into both nostrils 2 (two) times daily  ? calcium carbonate 500 mg calcium (1,250 mg) chewable tablet Take 1 tablet by mouth 2 (two) times daily With IRON  ? DULoxetine (CYMBALTA) 30 MG DR capsule Take 3 capsules (90 mg total) by mouth once daily 180 capsule 3  ? fluticasone-umeclidinium-vilanterol (TRELEGY ELLIPTA) 100-62.5-25 mcg inhaler Inhale 1 inhalation into the lungs once daily 1 each 6  ? ipratropium (ATROVENT) 0.03 % nasal spray Place 2 sprays into both nostrils 3 (three) times daily as needed for Rhinitis  ? lisinopriL (ZESTRIL) 5 MG tablet TAKE 1 TABLET BY MOUTH EVERY DAY 90 tablet 3  ? mometasone (ELOCON) 0.1 % lotion INSTILL 4 DROPS INTO EACH EAR EVERY NIGHT AT BEDTIME AS NEEDED FOR DRY SKIN AND ITCHING  ? multivitamin capsule Take 1 capsule by mouth once daily Bariatic multivitamin with iron- 1 tablet by mouth once daily  ? mupirocin (BACTROBAN) 2 % ointment APPLY WITH QTIP TWICE DAILY AS NEEDED FOR NASAL DRYNESS  AND INFLAMMATION AS DIRECTED  ? nitroGLYcerin (NITROSTAT) 0.4 MG SL tablet Place 1 tablet (0.4 mg total) under the tongue every 5 (five) minutes as needed for Chest pain May take up to 3 doses. 25 tablet 5  ? QUEtiapine (SEROQUEL) 100 MG tablet Take 1 tablet (100 mg total) by mouth at bedtime 90 tablet 3  ? rOPINIRole (REQUIP XL) 4 MG XL tablet Take 1 tablet (4 mg total) by mouth at bedtime 90 tablet 3  ? rosuvastatin (CRESTOR) 5 MG tablet Take 1 tablet (5 mg total) by mouth once daily 90 tablet 3  ? FUROsemide (LASIX) 20 MG tablet Take 1 tablet (20 mg total) by mouth once daily as needed for Edema 30 tablet 11  ? pregabalin (LYRICA) 200 MG capsule Take 1 capsule (200 mg total) by mouth 3 (three) times daily for 30 days 90 capsule 5  ? ?No current facility-administered medications for this visit.  ? ?Problem List:  ?Patient Active Problem List  ?Diagnosis  ? Cataract  ? Coronary artery disease involving native coronary artery of native heart without angina pectoris - followed by Dr. Clayborn Bigness  ? Hyperlipidemia LDL goal <70 (LDL 37 - 08/21/21)  ? Osteoarthritis of right knee  ? Status post laparoscopic sleeve gastrectomy  ? Obesity, morbid (CMS-HCC)  ? 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 RLS (restless legs syndrome)  ? Insomnia  ? History of normocytic normochromic anemia (Hgb 11.3 -  08/21/21)  ? Medicare annual wellness visit, initial 03/17/18  ? DNR (do not resuscitate)  ? Acquired claw toe of right foot  ? Medicare annual wellness visit, subsequent 02/28/21  ? Moderate major depression (CMS-HCC) - followed by [facility] Manuella Ghazi)  ? Cognitive impairment on donepezil - followed by Dr. Manuella Ghazi  ? Tremor on keppra - followed by Dr. Manuella Ghazi  ? Thrombocytopenia (Plt 137 - 08/21/21)  ? CKD (chronic kidney disease) stage 3, Cr 1.5 and GFR 46 - 08/21/21  ? ?Allergies:  ?Adhesive, Penicillins, and Shellfish containing products ? ?History:  ?Past Medical History:  ?Diagnosis Date  ? Allergy  ?Penicillin,shellfish,some  adhesives  ? Coronary atherosclerosis  ? Diabetes mellitus type 2, uncomplicated (CMS-HCC)  ? Essential hypertension  ? History of cataract  ? History of MI (myocardial infarction) 03/1995  ? History of myocardial infarction 03/1995  ? Hyperlipidemia  ? Insomnia  ? Migraine headache  ? Moderate major depression (CMS-HCC)  ? Neuropathy  ? Obesity  ? OSA (obstructive sleep apnea)  ? Osteoarthrosis involving lower leg  ? Overactive bladder  ? Presbycusis  ? PUD (peptic ulcer disease)  ? Renal insufficiency (Cr 1.3 and GFR 54 - 02/08/20) 02/17/2020  ? RLS (restless legs syndrome)  ? Thrombocytopenia (Plt 115 - 02/21/21) 02/28/2021  ? Tremor on keppra - followed by Dr. Manuella Ghazi 02/28/2021  ? ?Past Surgical History:  ?Procedure Laterality Date  ? HERNIA REPAIR Bilateral 2004  ? COLONOSCOPY 10/28/2012  ?Dr. Bill Salinas @ [facility] - Tubular Adenoma  ? EGD 01/28/2016  ?DR. J. Colm  ? Laproscopic vertical sleeve gastropathy 01/18/2017  ?Done by Margreta Journey  ? CARPOMETACARPAL (CMC) FUSION OF THUMB (Right) MC arthroplasty not fusion right thumb Right 04/08/2018  ?Dr. Rudene Christians  ? COLONOSCOPY 05/19/2018  ?Diverticulosis/PHx CP/NO repeat due to age/TKT  ? CARPOMETACARPAL (CMC) FUSION OF LEFT THUMB, DIABETIC (Left) Left 12/02/2018  ?Dr. Rudene Christians  ? Left elbow ulnar nerve release Left 04/14/2019  ?Dr. Rudene Christians  ? Bilateral knee replacement surgeries  ? CATARACT EXTRACTION Bilateral  ?Right cataract- 11/23/13; left cataract- 12/21/13  ? CORONARY ANGIOPLASTY WITH STENT PLACEMENT  ? JOINT REPLACEMENT  ?Knees  ? KNEE ARTHROSCOPY  ? Right foot tendon repair  ? Right heel spur repair  ? Right shoulder surgery  ? Stent x 2- RCA  ? TONSILLECTOMY  ? ?Family History  ?Problem Relation Age of Onset  ? No Known Problems Mother  ? Stroke Father  ? Asthma Daughter  ? ?Shx: reports that he quit smoking about 27 years ago. His smoking use included cigarettes. He has a 40.00 pack-year smoking history. He has been exposed to tobacco smoke. He has never used smokeless  tobacco. He reports current alcohol use. He reports that he does not use drugs. ? ?Review of System: ?As per above.anterior chest wall pain. Neuro muscular. All systems were reviewed with him in totality. Again, no other cardiopulmonary symptoms. No nausea, vomiting, diarrhea, blood in stools,dysuria or flank pain. No GERD, dysphagia, choking spells, polyphagia, polydipsia, heat or cold intolerance, rashes, lymph nodes, bleeding, seizures, TIAs, passing out spells, suicidaly ideation, proximal muscle tenderness, joint effusions.  ? ?Physical Exam: ?BP 98/58  Pulse 58  SpO2 93% Comment: RA  ?93% wt 265 lbs ( weight at home) ?General: NAD. Able to speak in complete sentences without cough or dyspnea ?HEENT: Normocephalic, nontraumatic. Extraocular movements intact ?NECK: Supple. No JVD, nodes, thryomegaly ?CV: RRR no murmurs, gallops, rubs ?PULM: Normal respiratory effort, Clear to auscultation bilaterally without wheezing or crackles ?  EXTREMITIES: No significant edema, cyanosis or Homans'signs ?SKIN: Fair turgor. No rashes ?LYMPHATIC: No nodes ?NEURO: No gross deficits ?PSYCH: Appropriate affect  ? ?Diagnostics: ? ?INTERPRETATION ?NORMAL LEFT VENTRICULAR SYSTOLIC FUNCTION WITH AN ESTIMATED EF = 50-55 % ?NORMAL RIGHT VENTRICULAR SYSTOLIC FUNCTION ?MILD TRICUSPID AND MITRAL VALVE INSUFFICIENCY ?NO VALVULAR STENOSIS ?MILD RV ENLARGEMENT ?MILD BIATRIAL ENLARGEMENT ?MILD PULMONARY HYPERTENSION WITH AN RVSP = 50 mmHg)(RAP = 0-5 mmHg) ?_________________________________________________________________________________________ ?Electronically signed by Lujean Amel, MD on 09/06/2021 02: 29 PM ?Performed By: Johnathan Hausen, RDCS, RVT ?Ordering Physician: WHITE, DELICIA ? ?Impression: ?He does have pulmonary hypertension, moderate obstruction. Over weight, activity is limited as a result. Desat with activity. On oxygen. Suspect he may also have sleep apnea. All of which are risk factors for pulmonary hypertension. On  letaris 5 mg q day. Cleared for the foot ( left) surgery.  ?-prn albuterol ?-leteris 5 mg q day ?-Portable oxygen at 2 liters with activity ?-trelegy and albuterol ?-following cardiology recs ?-serial bnp, bmp, echo ?-f/u in

## 2021-10-03 MED ORDER — FAMOTIDINE 20 MG PO TABS
20.0000 mg | ORAL_TABLET | Freq: Once | ORAL | Status: AC
Start: 2021-10-03 — End: 2021-10-04

## 2021-10-03 MED ORDER — CHLORHEXIDINE GLUCONATE 0.12 % MT SOLN: 15.0000 mL | Freq: Once | OROMUCOSAL | Status: AC

## 2021-10-03 MED ORDER — ORAL CARE MOUTH RINSE: 15.0000 mL | Freq: Once | OROMUCOSAL | Status: AC

## 2021-10-03 MED ORDER — VANCOMYCIN HCL 1500 MG/300ML IV SOLN
1500.0000 mg | INTRAVENOUS | Status: AC
  Administered 2021-10-04: 1500 mg via INTRAVENOUS
  Filled 2021-10-03 (×2): qty 300

## 2021-10-03 MED ORDER — LACTATED RINGERS IV SOLN: INTRAVENOUS | Status: DC

## 2021-10-04 ENCOUNTER — Ambulatory Visit: Payer: Medicare PPO | Admitting: Anesthesiology

## 2021-10-04 ENCOUNTER — Encounter: Payer: Self-pay | Admitting: Podiatry

## 2021-10-04 ENCOUNTER — Ambulatory Visit: Payer: Medicare PPO

## 2021-10-04 ENCOUNTER — Other Ambulatory Visit: Payer: Self-pay

## 2021-10-04 ENCOUNTER — Ambulatory Visit
Admission: RE | Admit: 2021-10-04 | Discharge: 2021-10-04 | Disposition: A | Discharge status: Home / Self Care | Payer: Medicare PPO | Attending: Podiatry | Admitting: Podiatry

## 2021-10-04 ENCOUNTER — Encounter
Admission: RE | Disposition: A | Discharge status: Home / Self Care | Payer: Self-pay | Source: Home / Self Care | Attending: Podiatry

## 2021-10-04 DIAGNOSIS — I10 Essential (primary) hypertension: Secondary | ICD-10-CM | POA: Insufficient documentation

## 2021-10-04 DIAGNOSIS — E1142 Type 2 diabetes mellitus with diabetic polyneuropathy: Secondary | ICD-10-CM | POA: Insufficient documentation

## 2021-10-04 DIAGNOSIS — Z9861 Coronary angioplasty status: Secondary | ICD-10-CM | POA: Insufficient documentation

## 2021-10-04 DIAGNOSIS — G4730 Sleep apnea, unspecified: Secondary | ICD-10-CM | POA: Insufficient documentation

## 2021-10-04 DIAGNOSIS — I2510 Atherosclerotic heart disease of native coronary artery without angina pectoris: Secondary | ICD-10-CM | POA: Diagnosis not present

## 2021-10-04 DIAGNOSIS — Z87891 Personal history of nicotine dependence: Secondary | ICD-10-CM | POA: Diagnosis not present

## 2021-10-04 DIAGNOSIS — M2042 Other hammer toe(s) (acquired), left foot: Secondary | ICD-10-CM | POA: Insufficient documentation

## 2021-10-04 DIAGNOSIS — Z8711 Personal history of peptic ulcer disease: Secondary | ICD-10-CM | POA: Diagnosis not present

## 2021-10-04 DIAGNOSIS — G709 Myoneural disorder, unspecified: Secondary | ICD-10-CM | POA: Insufficient documentation

## 2021-10-04 DIAGNOSIS — I272 Pulmonary hypertension, unspecified: Secondary | ICD-10-CM | POA: Insufficient documentation

## 2021-10-04 DIAGNOSIS — M24575 Contracture, left foot: Secondary | ICD-10-CM | POA: Diagnosis not present

## 2021-10-04 DIAGNOSIS — I252 Old myocardial infarction: Secondary | ICD-10-CM | POA: Diagnosis not present

## 2021-10-04 DIAGNOSIS — I739 Peripheral vascular disease, unspecified: Secondary | ICD-10-CM | POA: Insufficient documentation

## 2021-10-04 HISTORY — PX: EXCISION PARTIAL PHALANX: SHX6617

## 2021-10-04 LAB — GLUCOSE, CAPILLARY
Glucose-Capillary: 64 mg/dL — ABNORMAL LOW (ref 70–99)
Glucose-Capillary: 94 mg/dL (ref 70–99)

## 2021-10-04 SURGERY — EXCISION, PHALANX, PARTIAL
Anesthesia: General | Site: Toe | Laterality: Left

## 2021-10-04 MED ORDER — FENTANYL CITRATE (PF) 100 MCG/2ML IJ SOLN: 25.0000 ug | INTRAMUSCULAR | Status: DC | PRN

## 2021-10-04 MED ORDER — SUCCINYLCHOLINE CHLORIDE 200 MG/10ML IV SOSY
PREFILLED_SYRINGE | INTRAVENOUS | Status: DC | PRN
Start: 2021-10-04 — End: 2021-10-04
  Administered 2021-10-04: 100 mg via INTRAVENOUS

## 2021-10-04 MED ORDER — FAMOTIDINE 20 MG PO TABS
ORAL_TABLET | ORAL | Status: AC
  Administered 2021-10-04: 20 mg via ORAL
  Filled 2021-10-04: qty 1

## 2021-10-04 MED ORDER — ACETAMINOPHEN 10 MG/ML IV SOLN
INTRAVENOUS | Status: DC | PRN
  Administered 2021-10-04: 1000 mg via INTRAVENOUS

## 2021-10-04 MED ORDER — EPHEDRINE 5 MG/ML INJ
INTRAVENOUS | Status: AC
  Filled 2021-10-04: qty 5

## 2021-10-04 MED ORDER — HYDROCODONE-ACETAMINOPHEN 5-325 MG PO TABS: 1.0000 | ORAL_TABLET | ORAL | 0 refills | Status: DC | PRN

## 2021-10-04 MED ORDER — ONDANSETRON HCL 4 MG/2ML IJ SOLN
INTRAMUSCULAR | Status: DC | PRN
  Administered 2021-10-04: 4 mg via INTRAVENOUS

## 2021-10-04 MED ORDER — EPHEDRINE SULFATE (PRESSORS) 50 MG/ML IJ SOLN
INTRAMUSCULAR | Status: DC | PRN
  Administered 2021-10-04 (×2): 10 mg via INTRAVENOUS

## 2021-10-04 MED ORDER — CHLORHEXIDINE GLUCONATE 0.12 % MT SOLN
OROMUCOSAL | Status: AC
  Administered 2021-10-04: 15 mL via OROMUCOSAL
  Filled 2021-10-04: qty 15

## 2021-10-04 MED ORDER — OXYCODONE HCL 5 MG PO TABS: 5.0000 mg | ORAL_TABLET | Freq: Once | ORAL | Status: DC | PRN

## 2021-10-04 MED ORDER — ACETAMINOPHEN 10 MG/ML IV SOLN
INTRAVENOUS | Status: AC
  Filled 2021-10-04: qty 100

## 2021-10-04 MED ORDER — PHENYLEPHRINE HCL (PRESSORS) 10 MG/ML IV SOLN
INTRAVENOUS | Status: DC | PRN
  Administered 2021-10-04 (×2): 80 ug via INTRAVENOUS

## 2021-10-04 MED ORDER — FENTANYL CITRATE (PF) 100 MCG/2ML IJ SOLN
INTRAMUSCULAR | Status: DC | PRN
  Administered 2021-10-04: 50 ug via INTRAVENOUS

## 2021-10-04 MED ORDER — DEXMEDETOMIDINE HCL IN NACL 200 MCG/50ML IV SOLN
INTRAVENOUS | Status: DC | PRN
  Administered 2021-10-04: 8 ug via INTRAVENOUS
  Administered 2021-10-04 (×2): 4 ug via INTRAVENOUS

## 2021-10-04 MED ORDER — BUPIVACAINE HCL 0.5 % IJ SOLN
INTRAMUSCULAR | Status: DC | PRN
  Administered 2021-10-04: 10 mL

## 2021-10-04 MED ORDER — PROPOFOL 10 MG/ML IV BOLUS
INTRAVENOUS | Status: AC
  Filled 2021-10-04: qty 40

## 2021-10-04 MED ORDER — DEXAMETHASONE SODIUM PHOSPHATE 10 MG/ML IJ SOLN
INTRAMUSCULAR | Status: AC
  Filled 2021-10-04: qty 1

## 2021-10-04 MED ORDER — BUPIVACAINE HCL (PF) 0.5 % IJ SOLN
INTRAMUSCULAR | Status: AC
  Filled 2021-10-04: qty 30

## 2021-10-04 MED ORDER — ONDANSETRON HCL 4 MG/2ML IJ SOLN
INTRAMUSCULAR | Status: AC
  Filled 2021-10-04: qty 2

## 2021-10-04 MED ORDER — PHENYLEPHRINE 80 MCG/ML (10ML) SYRINGE FOR IV PUSH (FOR BLOOD PRESSURE SUPPORT)
PREFILLED_SYRINGE | INTRAVENOUS | Status: AC
  Filled 2021-10-04: qty 10

## 2021-10-04 MED ORDER — LIDOCAINE HCL (CARDIAC) PF 100 MG/5ML IV SOSY
PREFILLED_SYRINGE | INTRAVENOUS | Status: DC | PRN
  Administered 2021-10-04: 50 mg via INTRAVENOUS

## 2021-10-04 MED ORDER — 0.9 % SODIUM CHLORIDE (POUR BTL) OPTIME
TOPICAL | Status: DC | PRN
Start: 2021-10-04 — End: 2021-10-04
  Administered 2021-10-04: 1000 mL

## 2021-10-04 MED ORDER — OXYCODONE HCL 5 MG/5ML PO SOLN: 5.0000 mg | Freq: Once | ORAL | Status: DC | PRN

## 2021-10-04 MED ORDER — PROPOFOL 10 MG/ML IV BOLUS
INTRAVENOUS | Status: DC | PRN
Start: 2021-10-04 — End: 2021-10-04
  Administered 2021-10-04: 150 mg via INTRAVENOUS

## 2021-10-04 MED ORDER — FENTANYL CITRATE (PF) 100 MCG/2ML IJ SOLN
INTRAMUSCULAR | Status: AC
  Filled 2021-10-04: qty 2

## 2021-10-04 MED ORDER — DEXAMETHASONE SODIUM PHOSPHATE 10 MG/ML IJ SOLN
INTRAMUSCULAR | Status: DC | PRN
  Administered 2021-10-04: 10 mg via INTRAVENOUS

## 2021-10-04 SURGICAL SUPPLY — 35 items
BLADE OSC/SAGITTAL MD 5.5X18 (BLADE) ×1 IMPLANT
BLADE SURG 15 STRL LF DISP TIS (BLADE) ×2 IMPLANT
BLADE SURG 15 STRL SS (BLADE) ×4
BLADE SURG MINI STRL (BLADE) ×2 IMPLANT
BNDG CONFORM 2 STRL LF (GAUZE/BANDAGES/DRESSINGS) ×2 IMPLANT
BNDG ELASTIC 4X5.8 VLCR STR LF (GAUZE/BANDAGES/DRESSINGS) ×2 IMPLANT
BNDG ESMARK 4X12 TAN STRL LF (GAUZE/BANDAGES/DRESSINGS) ×2 IMPLANT
BNDG GAUZE ELAST 4 BULKY (GAUZE/BANDAGES/DRESSINGS) ×2 IMPLANT
DRAPE FLUOR MINI C-ARM 54X84 (DRAPES) ×2 IMPLANT
DURAPREP 26ML APPLICATOR (WOUND CARE) ×2 IMPLANT
GAUZE SPONGE 4X4 12PLY STRL (GAUZE/BANDAGES/DRESSINGS) ×2 IMPLANT
GAUZE XEROFORM 1X8 LF (GAUZE/BANDAGES/DRESSINGS) ×2 IMPLANT
GLOVE SURG ENC MOIS LTX SZ7.5 (GLOVE) ×2 IMPLANT
GLOVE SURG UNDER LTX SZ8 (GLOVE) ×2 IMPLANT
GOWN STRL REUS W/ TWL LRG LVL3 (GOWN DISPOSABLE) ×2 IMPLANT
GOWN STRL REUS W/TWL LRG LVL3 (GOWN DISPOSABLE) ×4
KIT TURNOVER KIT A (KITS) ×2 IMPLANT
MANIFOLD NEPTUNE II (INSTRUMENTS) ×2 IMPLANT
NDL FILTER BLUNT 18X1 1/2 (NEEDLE) ×1 IMPLANT
NDL HYPO 25X1 1.5 SAFETY (NEEDLE) ×2 IMPLANT
NEEDLE FILTER BLUNT 18X 1/2SAF (NEEDLE) ×1
NEEDLE FILTER BLUNT 18X1 1/2 (NEEDLE) ×1 IMPLANT
NEEDLE HYPO 25X1 1.5 SAFETY (NEEDLE) ×4 IMPLANT
NS IRRIG 1000ML POUR BTL (IV SOLUTION) ×1 IMPLANT
PACK EXTREMITY ARMC (MISCELLANEOUS) ×2 IMPLANT
SOL PREP PVP 2OZ (MISCELLANEOUS) ×2
SOLUTION PREP PVP 2OZ (MISCELLANEOUS) ×1 IMPLANT
STOCKINETTE BIAS CUT 4 980044 (GAUZE/BANDAGES/DRESSINGS) ×2 IMPLANT
STOCKINETTE STRL 6IN 960660 (GAUZE/BANDAGES/DRESSINGS) ×2 IMPLANT
STRIP CLOSURE SKIN 1/4X4 (GAUZE/BANDAGES/DRESSINGS) ×2 IMPLANT
SUT ETHILON 5-0 (SUTURE) ×4
SUT ETHILON 5-0 C-3 18XMFL BLK (SUTURE) ×2
SUTURE ETHLN 5-0 C3 18XMF BLK (SUTURE) IMPLANT
SYR 10ML LL (SYRINGE) ×2 IMPLANT
WIRE Z .045 C-WIRE SPADE TIP (WIRE) ×3 IMPLANT

## 2021-10-04 NOTE — H&P (Signed)
Subjective: Patient presents today for surgery for arthroplasties of toes 2 3 and 4 of the left foot. ? ?Objective: Neurovascular status grossly intact.  Reducible hammertoe contractures noted on toes 2, 3, and 4 left foot.  No open lesions. ? ?Assessment: Hammertoe contractures left foot ? ?Plan: Medical history and physical in the chart reviewed.  No interval changes from previous office examination.  Patient stable to proceed with surgery planned for arthroplasties left second, third, and fourth toes ? ?

## 2021-10-04 NOTE — Anesthesia Postprocedure Evaluation (Signed)
Anesthesia Post Note ? ?Patient: Brian Kramer ? ?Procedure(s) Performed: EXCISION PARTIAL PHALANX - 2, 3, 4 (Left: Toe) ? ?Patient location during evaluation: PACU ?Anesthesia Type: General ?Level of consciousness: awake and alert ?Pain management: pain level controlled ?Vital Signs Assessment: post-procedure vital signs reviewed and stable ?Respiratory status: spontaneous breathing, nonlabored ventilation, respiratory function stable and patient connected to nasal cannula oxygen ?Cardiovascular status: blood pressure returned to baseline and stable ?Postop Assessment: no apparent nausea or vomiting ?Anesthetic complications: no ? ? ?No notable events documented. ? ? ?Last Vitals:  ?Vitals:  ? 10/04/21 1200 10/04/21 1214  ?BP: 125/64 123/81  ?Pulse: 62 68  ?Resp: 18 18  ?Temp: 36.7 ?C 36.6 ?C  ?SpO2: 92% 97%  ?  ?Last Pain:  ?Vitals:  ? 10/04/21 1214  ?TempSrc: Oral  ?PainSc: 0-No pain  ? ? ?  ?  ?  ?  ?  ?  ? ?Precious Haws Riki Berninger ? ? ? ? ?

## 2021-10-04 NOTE — Transfer of Care (Signed)
Immediate Anesthesia Transfer of Care Note ? ?Patient: Brian Kramer ? ?Procedure(s) Performed: Procedure(s): ?EXCISION PARTIAL PHALANX - 2, 3, 4 (Left) ? ?Patient Location: PACU ? ?Anesthesia Type:General ? ?Level of Consciousness: sedated ? ?Airway & Oxygen Therapy: Patient Spontanous Breathing and Patient connected to face mask oxygen ? ?Post-op Assessment: Report given to RN and Post -op Vital signs reviewed and stable ? ?Post vital signs: Reviewed and stable ? ?Last Vitals:  ?Vitals:  ? 10/04/21 0947 10/04/21 1131  ?BP: (!) 115/56 (!) 131/56  ?Pulse: (!) 55 66  ?Resp: 18 13  ?Temp: 36.6 ?C 36.8 ?C  ?SpO2: 94% 100%  ? ? ?Complications: No apparent anesthesia complications ?

## 2021-10-04 NOTE — Op Note (Signed)
Date of operation: 10/04/2021. ? ?Surgeon: Ricci Barker D.P.M. ? ?Preoperative diagnosis: Hammertoes left second, third, and fourth toes. ? ?Postoperative diagnosis: Same. ? ?Procedures: Arthroplasties left second, third, and fourth toes. ? ?Anesthesia: General i endotracheal with local. ? ?Hemostasis: Esmarch tourniquet left ankle. ? ?Estimated blood loss: Less than 5 cc. ? ?Materials: Three 0.045 inch K wires. ? ?Complications: None apparent. ? ?Operative indications: This is a 76 year old male with chronic problems with painful hammertoes on his left foot.  Elects for surgical intervention. ? ?Operative procedure: Patient was taken to the operating room and placed on the table in the supine position.  Following satisfactory general anesthesia toes 2, 3, and 4 on the left foot were anesthetized with a total of 10 cc 0.5% Marcaine plain.  Foot was prepped and draped in usual sterile fashion.  Foot was exsanguinated using a Esmarch bandage and this was left intact around the ankle to act as a tourniquet.  Attention was then directed to the dorsal aspect of toes 2, 3, and 4 where linear incisions were made over the proximal inner phalangeal joint area.  Incisions were deepened via sharp and blunt dissection down to the level of the joint where a transverse tenotomy was performed and the capsular and periosteal tissues reflected off of the head of the proximal phalanx which was then resected using sagittal saw and removed in toto.  The wounds were then flushed with copious amounts of sterile saline and a 0.045 inch K wire was then driven through the middle and distal phalanx of each of the 3 toes and then retrograded proximally into the proximal phalanx.  Intraoperative FluoroScan views revealed good resection of the bone with alignment of the digits.  The wounds were flushed again with copious amounts of sterile saline and closed using 4-0 Vicryl simple interrupted suture for tendon reapproximation followed by 5-0  nylon simple interrupted sutures for skin closure.  Xeroform 4 x 4's and conformer applied to the left foot.  Tourniquet was released and blood flow noted return to all digits.  A second conformer and Kerlix and Ace wrap then applied to the left lower extremity.  Patient was awakened and transported to the PACU having tolerated the anesthesia and procedures well. ? ? ?

## 2021-10-04 NOTE — Discharge Instructions (Addendum)
1.  Elevate the left lower extremity on pillows.  2.  Keep the bandage on the left foot clean, dry, and do not remove.  3.  Sponge bathe only left lower extremity.  4.  Wear surgical shoe on the left foot whenever walking or standing.  5.  Take 1 pain pill, Norco, every 4 hours only if needed for pain.  AMBULATORY SURGERY  DISCHARGE INSTRUCTIONS   The drugs that you were given will stay in your system until tomorrow so for the next 24 hours you should not:  Drive an automobile Make any legal decisions Drink any alcoholic beverage   You may resume regular meals tomorrow.  Today it is better to start with liquids and gradually work up to solid foods.  You may eat anything you prefer, but it is better to start with liquids, then soup and crackers, and gradually work up to solid foods.   Please notify your doctor immediately if you have any unusual bleeding, trouble breathing, redness and pain at the surgery site, drainage, fever, or pain not relieved by medication.    Additional Instructions:        Please contact your physician with any problems or Same Day Surgery at [phone], Monday through Friday 6 am to 4 pm, or [facility] Main number at [phone]. 

## 2021-10-04 NOTE — Progress Notes (Signed)
Pt's FSBS is 64. Notified Dr. Randa Ngo. No new orders at this time.  ?

## 2021-10-04 NOTE — Interval H&P Note (Signed)
History and Physical Interval Note: ? ?10/04/2021 ?10:00 AM ? ?[name]  has presented today for surgery, with the diagnosis of E11.42 - Type 2 DM with diabetic neuropathy ?M20.42 - Hammertoes of left foot.  The various methods of treatment have been discussed with the patient and family. After consideration of risks, benefits and other options for treatment, the patient has consented to  Procedure(s): ?EXCISION PARTIAL PHALANX - 2, 3, 4 (Left) as a surgical intervention.  The patient's history has been reviewed, patient examined, no change in status, stable for surgery.  I have reviewed the patient's chart and labs.  Questions were answered to the patient's satisfaction.   ? ? ?Brian Kramer ? ? ?

## 2021-10-04 NOTE — Anesthesia Procedure Notes (Signed)
Procedure Name: Intubation ?Date/Time: 10/04/2021 10:25 AM ?Performed by: Jonna Clark, CRNA ?Pre-anesthesia Checklist: Patient identified, Patient being monitored, Timeout performed, Emergency Drugs available and Suction available ?Patient Re-evaluated:Patient Re-evaluated prior to induction ?Oxygen Delivery Method: Circle system utilized ?Preoxygenation: Pre-oxygenation with 100% oxygen ?Induction Type: IV induction ?Ventilation: Mask ventilation without difficulty ?Laryngoscope Size: Mac and 4 ?Grade View: Grade I ?Tube type: Oral ?Tube size: 7.5 mm ?Number of attempts: 1 ?Airway Equipment and Method: Stylet ?Placement Confirmation: ETT inserted through vocal cords under direct vision, positive ETCO2 and breath sounds checked- equal and bilateral ?Secured at: 22 cm ?Tube secured with: Tape ?Dental Injury: Teeth and Oropharynx as per pre-operative assessment  ? ? ? ? ?

## 2021-10-04 NOTE — Anesthesia Preprocedure Evaluation (Signed)
Anesthesia Evaluation  ?Patient identified by MRN, date of birth, ID band ?Patient awake ? ? ? ?Reviewed: ?Allergy & Precautions, NPO status , Patient's Chart, lab work & pertinent test results ? ?History of Anesthesia Complications ?Negative for: history of anesthetic complications ? ?Airway ?Mallampati: III ? ?TM Distance: >3 FB ?Neck ROM: full ? ? ? Dental ? ?(+) Missing ?  ?Pulmonary ?neg shortness of breath, sleep apnea , former smoker,  ?  ? ?+ decreased breath sounds ? ? ? ? ? Cardiovascular ?Exercise Tolerance: Good ?hypertension, pulmonary hypertension(-) angina+ CAD, + Past MI, + Cardiac Stents and + Peripheral Vascular Disease  ?Normal cardiovascular exam ? ? ?  ?Neuro/Psych ? Headaches,  Neuromuscular disease negative psych ROS  ? GI/Hepatic ?Neg liver ROS, hiatal hernia, PUD, GERD  Controlled,  ?Endo/Other  ?negative endocrine ROS ? Renal/GU ?  ? ?  ?Musculoskeletal ? ?(+) Arthritis ,  ? Abdominal ?  ?Peds ? Hematology ?negative hematology ROS ?(+)   ?Anesthesia Other Findings ?Past Medical History: ?No date: Anginal pain (HCC) ?    Comment:  rare ?No date: Arthritis ?No date: Coronary artery disease ?04/08/2021: Essential hypertension ?No date: GERD (gastroesophageal reflux disease) ?    Comment:  NOT since gastric sleeve ?No date: Headache ?    Comment:  has resolved for the most part ?2004: History of hiatal hernia ?    Comment:  repaired  ?No date: Insomnia ?1996: Myocardial infarction [facility]) ?04/08/2021: PAD (peripheral artery disease) (HCC) ?No date: Peripheral neuropathy ?    Comment:  neuropathy ?No date: PUD (peptic ulcer disease) ?No date: Pulmonary hypertension (HCC) ?No date: RLS (restless legs syndrome) ?No date: Sleep apnea ?    Comment:  no osa now since gastric sleeve.  no cpap ? ?Past Surgical History: ?No date: arthroscpic knee 2005; Right ?No date: Cardiac Stent 1997 ?No date: Cardiac Stent 2000 ?04/08/2018: CARPOMETACARPAL (CMC) FUSION OF THUMB;  Right ?    Comment:  Procedure: CARPOMETACARPAL (CMC) FUSION OF THUMB;   ?             Surgeon: Kennedy Bucker, MD;  Location: [facility] ORS;   ?             Service: Orthopedics;  Laterality: Right; ?12/02/2018: CARPOMETACARPAL (CMC) FUSION OF THUMB; Left ?    Comment:  Procedure: CARPOMETACARPAL (CMC) FUSION OF  LEFT THUMB,  ?             DIABETIC;  Surgeon: Kennedy Bucker, MD;  Location: [facility]  ?             ORS;  Service: Orthopedics;  Laterality: Left; ?No date: COLONOSCOPY ?05/19/2018: COLONOSCOPY WITH PROPOFOL; N/A ?    Comment:  Procedure: COLONOSCOPY WITH PROPOFOL;  Surgeon: [place],  ?             Boykin Nearing, MD;  Location: [facility] ENDOSCOPY;  Service:  ?             Gastroenterology;  Laterality: N/A; ?No date: CORONARY ANGIOPLASTY ?    Comment:  2 stents ?2015: EYE SURGERY ?    Comment:  Bilateral cataract ?2017: Gastric Sleeve Bariatric Surgery ?01/15/2018: HAMMER TOE SURGERY; Right ?    Comment:  Procedure: HAMMER TOE CORRECTION-2ND & 3RD;  Surgeon:  ?             Recardo Evangelist, DPM;  Location: [facility] ORS;  Service:  ?             Podiatry;  Laterality: Right; ?No  date: HERNIA REPAIR; Bilateral ?    Comment:  inguinal ?2007: JOINT REPLACEMENT ?    Comment:  Left knee ?2015: JOINT REPLACEMENT ?    Comment:  Right Knee ?No date: left knee arthroscopy 2006; Left ?05/01/2020: RIGHT AND LEFT HEART CATH; Left ?    Comment:  Procedure: RIGHT AND LEFT HEART CATH;  Surgeon:  ?             Alwyn Pea, MD;  Location: [facility] INVASIVE CV LAB;  ?             Service: Cardiovascular;  Laterality: Left; ?No date: Right Heel Spur Resection 2012 ?No date: Right shoulder AC separation surgery in 1995; Right ?2017: Stem Cells to left heel ?No date: TONSILLECTOMY ?04/14/2019: ULNAR TUNNEL RELEASE; Left ?    Comment:  Procedure: LEFT CUBITAL TUNNEL RELEASE;  Surgeon: Rosita Kea,  ?             Casimiro Needle, MD;  Location: [facility] ORS;  Service: Orthopedics;  ?             Laterality: Left; ?2017: UPPER GI ENDOSCOPY ?No date: Vasectomy  1970's ?01/15/2018: WEIL OSTEOTOMY; Right ?    Comment:  Procedure: WEIL OSTEOTOMY-2ND & 3RD;  Surgeon: Orland Jarred,  ?             Molli Hazard DPM;  Location: [facility] ORS;  Service: Podiatry;   ?             Laterality: Right; ? ?BMI   ? Body Mass Index: 39.13 kg/m?  ?  ? ? Reproductive/Obstetrics ?negative OB ROS ? ?  ? ? ? ? ? ? ? ? ? ? ? ? ? ?  ?  ? ? ? ? ? ? ? ? ?Anesthesia Physical ?Anesthesia Plan ? ?ASA: 3 ? ?Anesthesia Plan: General ETT  ? ?Post-op Pain Management:   ? ?Induction: Intravenous ? ?PONV Risk Score and Plan: Ondansetron, Dexamethasone, Midazolam and Treatment may vary due to age or medical condition ? ?Airway Management Planned: Oral ETT ? ?Additional Equipment:  ? ?Intra-op Plan:  ? ?Post-operative Plan: Extubation in OR ? ?Informed Consent: I have reviewed the patients History and Physical, chart, labs and discussed the procedure including the risks, benefits and alternatives for the proposed anesthesia with the patient or authorized representative who has indicated his/her understanding and acceptance.  ? ? ? ?Dental Advisory Given ? ?Plan Discussed with: Anesthesiologist, CRNA and Surgeon ? ?Anesthesia Plan Comments: (Patient consented for risks of anesthesia including but not limited to:  ?- adverse reactions to medications ?- damage to eyes, teeth, lips or other oral mucosa ?- nerve damage due to positioning  ?- sore throat or hoarseness ?- Damage to heart, brain, nerves, lungs, other parts of body or loss of life ? ?Patient voiced understanding.)  ? ? ? ? ? ? ?Anesthesia Quick Evaluation ? ?

## 2021-12-04 ENCOUNTER — Other Ambulatory Visit: Payer: Self-pay | Admitting: Podiatry

## 2021-12-12 ENCOUNTER — Encounter
Admission: RE | Admit: 2021-12-12 | Discharge: 2021-12-12 | Disposition: A | Discharge status: Home / Self Care | Payer: TRICARE For Life (TFL) | Source: Ambulatory Visit | Attending: Podiatry | Admitting: Podiatry

## 2021-12-12 ENCOUNTER — Encounter: Payer: Self-pay | Admitting: Podiatry

## 2021-12-12 HISTORY — DX: Chronic obstructive pulmonary disease, unspecified: J44.9

## 2021-12-12 NOTE — Patient Instructions (Addendum)
Your procedure is scheduled on: Friday December 20, 2021. Report to Day [facility] inside [facility] second floor, stop by admissions desk before getting on elevator. To find out your arrival time please call [phone] between 1PM - 3PM on Thursday December 19, 2021.  Remember: Instructions that are not followed completely may result in serious medical risk,  up to and including death, or upon the discretion of your surgeon and anesthesiologist your  surgery may need to be rescheduled.     _X__ 1. Do not eat food after midnight the night before your procedure.                 No chewing gum or hard candies. You may drink clear liquids up to 2 hours                 before you are scheduled to arrive for your surgery- DO not drink clear                 liquids within 2 hours of the start of your surgery.                 Clear Liquids include:  water, apple juice without pulp, clear Gatorade, G2 or                  Gatorade Zero (avoid Red/Purple/Blue), Black Coffee or Tea (Do not add                 anything to coffee or tea).  __X__2.   Complete the "Ensure Clear Pre-surgery Clear Carbohydrate Drink" provided to you, 2 hours before arrival. **If you are diabetic you will be provided with an alternative drink, Gatorade Zero or G2.  __X__3.  On the morning of surgery brush your teeth with toothpaste and water, you                may rinse your mouth with mouthwash if you wish.  Do not swallow any toothpaste or mouthwash.     _X__ 4.  No Alcohol for 24 hours before or after surgery.   _X__ 5.  Do Not Smoke or use e-cigarettes For 24 Hours Prior to Your Surgery.                 Do not use any chewable tobacco products for at least 6 hours prior to                 Surgery.  _X__  6.  Do not use any recreational drugs (marijuana, cocaine, heroin, ecstasy, MDMA or other)                For at least one week prior to your surgery.  Combination of these drugs with anesthesia                 May have life threatening results.  ____  7.  Bring all medications with you on the day of surgery if instructed.   __X__  8.  Notify your doctor if there is any change in your medical condition      (cold, fever, infections).     Do not wear jewelry, make-up, hairpins, clips or nail polish. Do not wear lotions, powders, or perfumes. You may wear deodorant. Do not shave 48 hours prior to surgery. Men may shave face and neck. Do not bring valuables to the hospital.    [facility] is not responsible for any belongings or valuables.  Contacts, dentures or bridgework may not be worn into surgery. Leave your suitcase in the car. After surgery it may be brought to your room. For patients admitted to the hospital, discharge time is determined by your treatment team.   Patients discharged the day of surgery will not be allowed to drive home.   Make arrangements for someone to be with you for the first 24 hours of your Same Day Discharge.   __X__ Take these medicines the morning of surgery with A SIP OF WATER:    1. ambrisentan (LETAIRIS) 5 MG  2. DULoxetine HCl 90 MG  3. LYRICA 200 MG  4.   5.  6.  ____ Fleet Enema (as directed)   __X__ Use CHG Soap (or wipes) as directed  ____ Use Benzoyl Peroxide Gel as instructed  __X__ Use inhalers on the day of surgery  Fluticasone-Umeclidin-Vilant (TRELEGY ELLIPTA) 100-62.5-25 MCG/ACT AEPB  ____ Stop metformin 2 days prior to surgery    ____ Take 1/2 of usual insulin dose the night before surgery. No insulin the morning          of surgery.   __X__ Stop aspirin 81 mg 5 days before your procedure as instructed by your doctor.   __X__ One Week prior to surgery- Stop Anti-inflammatories such as Ibuprofen, Aleve, Advil, Motrin, meloxicam (MOBIC), diclofenac, etodolac, ketorolac, Toradol, Daypro, piroxicam, Goody's or BC powders. OK TO USE TYLENOL IF NEEDED   __X__ Stop supplements until after surgery.    ____ Bring C-Pap to  the hospital.    If you have any questions regarding your pre-procedure instructions,  Please call Pre-admit Testing at [phone]

## 2021-12-13 ENCOUNTER — Encounter: Payer: Self-pay | Admitting: Podiatry

## 2021-12-13 NOTE — Progress Notes (Addendum)
Perioperative Services  Pre-Admission/Anesthesia Testing Clinical Review  Date: 12/13/21  Patient Demographics:  Name: Brian Kramer DOB:   1946/05/30 MRN:   [id number]  Planned Surgical Procedure(s):    Case: [id number] Date/Time: 12/20/21 0715   Procedures:      AMPUTATION TOE - SECOND AND THIRD - [phone] (Right: Toe)     FLEXOR TENOTOMY - FOURTH TOE - [phone] (Right)   Anesthesia type: Choice   Pre-op diagnosis: M20.41 - Hammertoe   Location: [facility] OR ROOM 01 / [place] [facility]   Surgeons: Sharlotte Alamo, DPM   NOTE: Available PAT nursing documentation and vital signs have been reviewed. Clinical nursing staff has updated patient's PMH/PSHx, current medication list, and drug allergies/intolerances to ensure comprehensive history available to assist in medical decision making as it pertains to the aforementioned surgical procedure and anticipated anesthetic course. Extensive review of available clinical information performed. [place] [facility] and PSHx updated with any diagnoses/procedures that  may have been inadvertently omitted during his intake with the pre-admission testing department's nursing staff.  Clinical Discussion:  Brian Kramer is a 76 y.o. male who is submitted for pre-surgical anesthesia review and clearance prior to him undergoing the above procedure. Patient is a Former Smoker (quit 03/1994). Pertinent PMH includes: CAD, MI, pulmonary hypertension, angina, HTN, HLD, T2DM, GERD (no daily Tx), PUD, hiatal hernia (s/p repair), anemia, thrombocytopenia, peripheral neuropathy, depression, insomnia, cognitive impairment.  Patient is followed by cardiology Clayborn Bigness, MD). He was last seen in the cardiology clinic on 09/12/2021; notes reviewed.  At the time of his clinic visit, patient was reported to be feeling "fairly well".  Patient with chronic complaints of LEFT-sided chest pain and chronic dyspnea, both of which are longstanding complaints and reported to be  at baseline.  Patient reported that sensation in his chest was a "dull grasping" pain that is intermittent in nature and occurs at rest.  Episodes last between 15 minutes to 2 hours.  Symptom is resolved with the application of heat.  He denies any episodes of PND, orthopnea, palpitations, significant peripheral edema, or presyncope/syncope.  Patient does have episodes of postural dizziness.  PMH significant for cardiovascular diagnoses.  Of note, complete records regarding patient's cardiovascular history not available for review at time of consult.  Information gathered from patient and notes from his local cardiologist.  Patient reported to have suffered an MI in 1996.  From what I gather from the notes, patient underwent diagnostic left heart catheterization, which revealed no obstructive disease.  He did not undergo any type of PCI.  Patient underwent diagnostic left heart catheterization in 1997.  During the study, obstructive disease was noted.  PCI was subsequently performed placing a stent of (unknown type) in the RCA.  Repeat diagnostic left heart catheterization was performed in 2000 revealing obstructive CAD.  PCI subsequently performed placing a stent (unknown type) to the LCx.  Patient has subsequently undergone diagnostic left heart catheterizations on 04/02/2010 and on 04/30/2015 revealing widely patent stents and no evidence of obstructive CAD.  Recommendations were for continued medical management.  Myocardial perfusion imaging study performed on 01/26/2020 revealing a low normal ventricular systolic function with an EF of 50-55%.  There was a medium defect of moderate severity present in the basal inferolateral and mid inferolateral locations consistent with previous infarction versus soft tissue attenuation but no reversible ischemia.  There were no significant ECG changes noted during the study.  Study determined to be intermediate risk.  Diagnostic right/left heart catheterization  performed on 05/01/2020 revealed a normal left trickle systolic function with an EF of 60%.  Left heart catheterization was essentially normal revealing no obstructive CAD.  Mean PA pressure 30, mean PCWP 16, RVEDP 19, AO sat 95, PA sat 74, CO 8.2 L/min, PVR 147, SVR 736.  Findings consistent with pulmonary hypertension.  Last TTE was performed on 09/05/2021 revealing a low normal left ventricular systolic function with an EF of 50%. Diastolic Doppler parameters consistent with abnormal relaxation (G1DD).  There was mild biatrial and right ventricular enlargement.  There was trivial to mild mitral, tricuspid, and pulmonary valve regurgitation.  RVSP elevated at 50 mmHg consistent with known mild pulmonary hypertension diagnosis.  Blood pressure well controlled at 120/78 on currently prescribed ACEi monotherapy.  Patient has a supply of short acting nitrates (NTG) to use on an as needed basis; denied recent use.  Patient takes ambrisentan for his pulmonary hypertension diagnosis; doing well.  He is on a statin for his HLD and further ASCVD prevention.  Patient formally with an OSAH diagnosis, however following sleeve gastrectomy, patient reporting that he no longer requires nocturnal PAP therapy.  Patient managed jointly by pulmonary medicine Raul Del, MD), who feels as if patient may still have obstructive apnea and would benefit from continued PAP use.  T2DM is diet controlled at this point; last HgbA1c was 5.6% when checked on 08/21/2021.  Functional capacity limited by arthritides, problems with his feet, unsteady gait, supplemental oxygen requirements, and multiple medical comorbidities.  Patient is not felt to be able to achieve 4 METS of activity without angina/anginal equivalent symptoms.  No changes were made to his medication regimen during his visit with cardiology.  Patient to follow-up with outpatient cardiology providers in 3 months or sooner if needed.  Brian Kramer is scheduled for  AMPUTATIONS OF THE RIGHT SECOND AND THIRD TOES; RIGHT FOURTH TOE FLEXOR TENOTOMY on 12/20/2021 with Dr. Sharlotte Alamo, DPM.  Given patient's past medical history significant for cardiovascular diagnoses, presurgical clearances were sought from patient's primary cardiologist and pulmonologist by the performing surgeon's office and PAT team.  Specialty clearances were obtained as follows:   Per pulmonary medicine, "he does have pulmonary hypertension, moderate obstruction. Over weight, activity is limited as a result. Desats with activity. On supplemental oxygen. Suspect he may also have sleep apnea. All of which are risk factors for pulmonary hypertension. On letaris 5 mg daily. Cleared for the foot surgery".  Per cardiology, "this patient is optimized for surgery and may proceed with the planned procedural course with a MODERATE risk of significant perioperative cardiovascular complications". In review of his medication reconciliation, it is noted the patient is on daily antiplatelet therapy. He has been instructed on recommendations from his cardiologist for holding his daily low-dose ASA for 5 days prior to his procedure with plans to restart as soon as postoperative bleeding risk felt to be minimized by his primary attending surgeon. The patient is aware that his last dose of ASA should be on 12/14/2021.  Patient denies previous perioperative complications with anesthesia in the past. In review of the available records, it is noted that patient underwent a general anesthetic course here at [facility] ([facility]) in 09/2021 without documented complications.      12/12/2021    8:45 AM 10/04/2021   12:14 PM 10/04/2021   12:00 PM  Vitals with BMI  Height 5' 9"     Weight 250 lbs    BMI 69.6    Systolic  097 353  Diastolic  81 64  Pulse  68 62    Providers/Specialists:   NOTE: Primary physician provider listed below. Patient may have been seen by APP or partner within  same practice.   PROVIDER ROLE / SPECIALTY LAST Merry Proud, DPM Podiatry (Surgeon) 12/04/2021  Dion Body, MD Primary Care Provider 12/04/2021  Katrine Coho, MD Cardiology 09/12/2021  Wallene Huh, MD Pulmonary Medicine 09/24/2021   Allergies:  Tape, Penicillins, and Shellfish allergy  Current Home Medications:   No current facility-administered medications for this encounter.    ambrisentan (LETAIRIS) 5 MG tablet   aspirin EC 81 MG tablet   azelastine (ASTELIN) 0.1 % nasal spray   Calcium 500-100 MG-UNIT CHEW   DULoxetine HCl 30 MG CSDR   ferrous sulfate 325 (65 FE) MG tablet   Fluticasone-Umeclidin-Vilant (TRELEGY ELLIPTA) 100-62.5-25 MCG/ACT AEPB   HYDROcodone-acetaminophen (NORCO/VICODIN) 5-325 MG tablet   ipratropium (ATROVENT) 0.03 % nasal spray   lisinopril (PRINIVIL,ZESTRIL) 5 MG tablet   LYRICA 200 MG capsule   mometasone (ELOCON) 0.1 % lotion   Multiple Vitamins-Minerals (BARIATRIC MULTIVITAMINS/IRON PO)   nitroGLYCERIN (NITROSTAT) 0.4 MG SL tablet   QUEtiapine (SEROQUEL) 100 MG tablet   rOPINIRole (REQUIP XL) 4 MG 24 hr tablet   rosuvastatin (CRESTOR) 5 MG tablet   History:   Past Medical History:  Diagnosis Date   Anemia    Anginal pain (HCC)    Arthritis    CKD (chronic kidney disease), stage III (HCC)    Cognitive impairment    COPD (chronic obstructive pulmonary disease) (HCC)    Coronary artery disease    a.) MI 03/1995 - no PCI; b.) LHC/PCI 1997 - stent (unknown type) x 1 to RCA; c.) LHC/PCI 2000 - stent (unknown type) x 1 to LCx; d.) LHC 04/02/2010 - insig CAD with widely patent stents; medical mgmt; e.) LHC 04/30/2015 - patent stents; insig CAD; medical mgmt; f.) R/LHC 05/01/20: EF 60%, norm cors.   Depression    Diastolic dysfunction 29/92/4268   a.)  TTE 09/05/2021: EF 50%, mild BAE, trivial PR, mild MR/TR; pHTN (RVSP 50 mmHg), G1DD.   Essential hypertension 04/08/2021   GERD (gastroesophageal reflux disease)    a.) improved  s/p sleeve gastrectomy; no daily Tx   History of hiatal hernia 2004   a.) s/p repair   History of obstructive sleep apnea    a.) resolved s/p sleeve gastrectomy; no longer requires nocturnal PAP therapy   HLD (hyperlipidemia)    Insomnia    Migraines    Myocardial infarction ([place]) 03/1995   a.) full details unknown; LHC performed, however no PCI performed.   PAD (peripheral artery disease) ([place]) 04/08/2021   Peripheral neuropathy    Presbycusis    PUD (peptic ulcer disease)    Pulmonary hypertension ([place])    a.) takes ambrisentan; b.)  R/LHC 05/01/2020: EF 60%, mean PA 30, mean PCWP 16, RVEDP 19, Ao sat 95, Pa sat 74, CO 8.2, PVR 137, SVR 736; c.)  TTE 09/05/2021: EF 50%, RVSP 50 mmHg.   RLS (restless legs syndrome)    a.) takes ropinirole   Thrombocytopenia (HCC)    Tremor    Type 2 diabetes, diet controlled ([place])    Past Surgical History:  Procedure Laterality Date   CARPOMETACARPAL ([place]) FUSION OF THUMB Right 04/08/2018   Procedure: CARPOMETACARPAL [facility]) FUSION OF THUMB;  Surgeon: Hessie Knows, MD;  Location: [facility] ORS;  Service: Orthopedics;  Laterality: Right;   CARPOMETACARPAL ([facility]) FUSION OF THUMB Left  12/02/2018   Procedure: CARPOMETACARPAL ([place]) FUSION OF  LEFT THUMB, DIABETIC;  Surgeon: Hessie Knows, MD;  Location: [facility] ORS;  Service: Orthopedics;  Laterality: Left;   CATARACT EXTRACTION Bilateral 2015   COLONOSCOPY     COLONOSCOPY WITH PROPOFOL N/A 05/19/2018   Procedure: COLONOSCOPY WITH PROPOFOL;  Surgeon: [place], Benay Pike, MD;  Location: [facility] ENDOSCOPY;  Service: Gastroenterology;  Laterality: N/A;   CORONARY ANGIOPLASTY WITH STENT PLACEMENT Left 1997   Procedure: CORONARY ANGIOPLASTY WITH STENT PLACEMENT; stent x1 (unknown type) to RCA   CORONARY ANGIOPLASTY WITH STENT PLACEMENT Left 2000   Procedure: CORONARY ANGIOPLASTY WITH STENT PLACEMENT; stent x1 (unknown type) to LCx   EXCISION PARTIAL PHALANX Left 10/04/2021   Procedure: EXCISION PARTIAL PHALANX - 2, 3,  4;  Surgeon: Sharlotte Alamo, DPM;  Location: [facility] ORS;  Service: Podiatry;  Laterality: Left;   HAMMER TOE SURGERY Right 01/15/2018   Procedure: HAMMER TOE CORRECTION-2ND & 3RD;  Surgeon: Albertine Patricia, DPM;  Location: [facility] ORS;  Service: Podiatry;  Laterality: Right;   HEEL SPUR RESECTION  2012   INGUINAL HERNIA REPAIR Bilateral    KNEE ARTHROSCOPY Right 2005   KNEE ARTHROSCOPY Left 2006   LAPAROSCOPIC GASTRIC SLEEVE RESECTION N/A 03/17/2016   LEFT HEART CATH AND CORONARY ANGIOGRAPHY Left 04/02/2010   Procedure: LEFT HEART CATH AND CORONARY ANGIOGRAPHY; Location: [facility]; Surgeon: Truitt Merle, MD   LEFT HEART CATH AND CORONARY ANGIOGRAPHY Left 04/30/2015   Procedure: LEFT HEART CATH AND CORONARY ANGIOGRAPHY; Location: [facility]; Surgeon: Phineas Inches, MD   RIGHT AND LEFT HEART CATH Left 05/01/2020   Procedure: RIGHT AND LEFT HEART CATH;  Surgeon: Yolonda Kida, MD;  Location: [facility];  Service: Cardiovascular;  Laterality: Left;   Right shoulder AC separation surgery in 1995 Right    Stem Cells to left heel  2017   TONSILLECTOMY     TOTAL KNEE ARTHROPLASTY Left 2007   TOTAL KNEE ARTHROPLASTY Right 2015   ULNAR TUNNEL RELEASE Left 04/14/2019   Procedure: LEFT CUBITAL TUNNEL RELEASE;  Surgeon: Hessie Knows, MD;  Location: [facility] ORS;  Service: Orthopedics;  Laterality: Left;   UPPER GI ENDOSCOPY  2017   VASECTOMY     1970s   WEIL OSTEOTOMY Right 01/15/2018   Procedure: WEIL OSTEOTOMY-2ND & 3RD;  Surgeon: Albertine Patricia, DPM;  Location: [facility] ORS;  Service: Podiatry;  Laterality: Right;   No family history on file. Social History   Tobacco Use   Smoking status: Former    Types: Cigarettes    Quit date: 03/09/1994    Years since quitting: 27.7   Smokeless tobacco: Never  Vaping Use   Vaping Use: Never used  Substance Use Topics   Alcohol use: Yes    Alcohol/week: 1.0 - 2.0 standard drink of alcohol    Types: 1 - 2 Standard drinks or equivalent per week    Comment: 1-2  per week   Drug use: Never    Pertinent Clinical Results:  LABS: Labs reviewed: Acceptable for surgery.   Ref Range & Units 11/20/2021  WBC (White Blood Cell Count) 4.1 - 10.2 10^3/uL 5.9   RBC (Red Blood Cell Count) 4.69 - 6.13 10^6/uL 3.50 Low    Hemoglobin 14.1 - 18.1 gm/dL 10.9 Low    Hematocrit 40.0 - 52.0 % 34.3 Low    MCV (Mean Corpuscular Volume) 80.0 - 100.0 fl 98.0   MCH (Mean Corpuscular Hemoglobin) 27.0 - 31.2 pg 31.1   MCHC (Mean Corpuscular Hemoglobin Concentration) 32.0 - 36.0  gm/dL 31.8 Low    Platelet Count 150 - 450 10^3/uL 127 Low    RDW-CV (Red Cell Distribution Width) 11.6 - 14.8 % 14.0   MPV (Mean Platelet Volume) 9.4 - 12.4 fl 9.4   Neutrophils 1.50 - 7.80 10^3/uL 3.86   Lymphocytes 1.00 - 3.60 10^3/uL 0.96 Low    Monocytes 0.00 - 1.50 10^3/uL 0.56   Eosinophils 0.00 - 0.55 10^3/uL 0.49   Basophils 0.00 - 0.09 10^3/uL 0.03   Neutrophil % 32.0 - 70.0 % 65.3   Lymphocyte % 10.0 - 50.0 % 16.2   Monocyte % 4.0 - 13.0 % 9.5   Eosinophil % 1.0 - 5.0 % 8.3 High    Basophil% 0.0 - 2.0 % 0.5   Immature Granulocyte % <=0.7 % 0.2   Immature Granulocyte Count <=0.06 10^3/L 0.01   Specimen Collected: 11/20/21 07:24   Performed by: [place]: 11/20/21 09:03  Received From: [place]  Result Received: 12/04/21 14:57    Ref Range & Units 11/20/2021  Glucose 70 - 110 mg/dL 99   Sodium 136 - 145 mmol/L 143   Potassium 3.6 - 5.1 mmol/L 5.1   Chloride 97 - 109 mmol/L 109   Carbon Dioxide (CO2) 22.0 - 32.0 mmol/L 27.4   Urea Nitrogen (BUN) 7 - 25 mg/dL 33 High    Creatinine 0.7 - 1.3 mg/dL 1.3   Glomerular Filtration Rate (eGFR), MDRD Estimate >60 mL/min/1.73sq m 54 Low    Calcium 8.7 - 10.3 mg/dL 8.9   AST  8 - 39 U/L 17   ALT  6 - 57 U/L 10   Alk Phos (alkaline Phosphatase) 34 - 104 U/L 68   Albumin 3.5 - 4.8 g/dL 3.9   Bilirubin, Total 0.3 - 1.2 mg/dL 0.5   Protein, Total 6.1 - 7.9 g/dL 6.6   A/G Ratio 1.0 -  5.0 gm/dL 1.4   Specimen Collected: 11/20/21 07:24   Performed by: [place]: 11/20/21 10:59  Received From: [facility]  Result Received: 12/04/21 14:57    ECG: Date: 09/26/2021 Time ECG obtained: 0819 AM Rate: 64 bpm Rhythm: normal sinus Axis (leads I and aVF): Right axis deviation Intervals: PR 190 ms. QRS 112 ms. QTc 416 ms. ST segment and T wave changes: TWIs noted in leads III and aVF. Evidence of an age undetermined inferior-posterior infarct present Comparison: Previously obtained tracing on 11/29/2018 demonstrated nonspecific inferior T wave abnormalities.    IMAGING / PROCEDURES: X-RAY FOOT LEFT 3 PLUS VIEWS performed on 10/09/2021 Good rectus alignment of the toes K wires in good position with the exception of the fourth extending through the base of the proximal phalanx and entering the joint area.  TRANSTHORACIC ECHOCARDIOGRAM performed on 09/05/2021 Low normal left ventricular systolic function with an EF of 16% Diastolic Doppler parameters consistent with abnormal relaxation (G1DD). Mild BAE Mild RVE Trivial PR Mild MR and TR No AR  Pulmonary hypertension --> RVSP = 50 mmHg No pericardial effusion  RIGHT/LEFT HEART CATHETERIZATION AND CORONARY ANGIOGRAPHY performed on 05/01/2020 Essentially normal left heart cath Normal left ventricular systolic function with an EF of 60% Normal coronaries with no evidence of obstructive disease Previously placed stents widely patent Mean PA pressure = 30 mmHg Mean PCWP = 16 mmHg RVEDP = 19 mmHg Ao sat 95 PA sat 74 CO = 8.2 L/min PVR 137 SVR 736  MYOCARDIAL PERFUSION IMAGING STUDY (LEXISCAN) performed on 01/26/2020 Mildly reduced left  ventricular systolic function with an EF of 50-55% Medium defect of moderate intensity present in the basal inferolateral and mid inferolateral locations consistent with previous infarction versus soft tissue attenuation with no reversible  ischemia No EKG changes Intermediate risk study  Impression and Plan:  Vinicio Lynk has been referred for pre-anesthesia review and clearance prior to him undergoing the planned anesthetic and procedural courses. Available labs, pertinent testing, and imaging results were personally reviewed by me. This patient has been appropriately cleared by cardiology (MODERATE) and by pulmonary medicine (ACCEPTABLE) with the individually indicated risk of significant perioperative complications.  Based on clinical review performed today (12/13/21), barring any significant acute changes in the patient's overall condition, it is anticipated that he will be able to proceed with the planned surgical intervention. Any acute changes in clinical condition may necessitate his procedure being postponed and/or cancelled. Patient will meet with anesthesia team (MD and/or CRNA) on the day of his procedure for preoperative evaluation/assessment. Questions regarding anesthetic course will be fielded at that time.   Pre-surgical instructions were reviewed with the patient during his PAT appointment and questions were fielded by PAT clinical staff. Patient was advised that if any questions or concerns arise prior to his procedure then he should return a call to PAT and/or his surgeon's office to discuss.  Honor Loh, MSN, APRN, FNP-C, CEN [facility]  Peri-operative Services Nurse Practitioner Phone: [phone] Fax: [phone] 12/13/21 12:52 PM  NOTE: This note has been prepared using Dragon dictation software. Despite my best ability to proofread, there is always the potential that unintentional transcriptional errors may still occur from this process.

## 2021-12-20 ENCOUNTER — Encounter: Payer: Self-pay | Admitting: Podiatry

## 2021-12-20 ENCOUNTER — Ambulatory Visit
Admission: RE | Admit: 2021-12-20 | Discharge: 2021-12-20 | Disposition: A | Discharge status: Home / Self Care | Payer: Medicare PPO | Attending: Podiatry | Admitting: Podiatry

## 2021-12-20 ENCOUNTER — Ambulatory Visit: Payer: Medicare PPO | Admitting: Urgent Care

## 2021-12-20 ENCOUNTER — Other Ambulatory Visit: Payer: Self-pay

## 2021-12-20 ENCOUNTER — Encounter
Admission: RE | Disposition: A | Discharge status: Home / Self Care | Payer: Self-pay | Source: Home / Self Care | Attending: Podiatry

## 2021-12-20 DIAGNOSIS — Z87891 Personal history of nicotine dependence: Secondary | ICD-10-CM | POA: Diagnosis not present

## 2021-12-20 DIAGNOSIS — I2510 Atherosclerotic heart disease of native coronary artery without angina pectoris: Secondary | ICD-10-CM | POA: Diagnosis not present

## 2021-12-20 DIAGNOSIS — Z8711 Personal history of peptic ulcer disease: Secondary | ICD-10-CM | POA: Diagnosis not present

## 2021-12-20 DIAGNOSIS — I25119 Atherosclerotic heart disease of native coronary artery with unspecified angina pectoris: Secondary | ICD-10-CM | POA: Diagnosis not present

## 2021-12-20 DIAGNOSIS — I252 Old myocardial infarction: Secondary | ICD-10-CM | POA: Insufficient documentation

## 2021-12-20 DIAGNOSIS — E785 Hyperlipidemia, unspecified: Secondary | ICD-10-CM | POA: Diagnosis not present

## 2021-12-20 DIAGNOSIS — E1122 Type 2 diabetes mellitus with diabetic chronic kidney disease: Secondary | ICD-10-CM | POA: Diagnosis not present

## 2021-12-20 DIAGNOSIS — J449 Chronic obstructive pulmonary disease, unspecified: Secondary | ICD-10-CM | POA: Diagnosis not present

## 2021-12-20 DIAGNOSIS — E1151 Type 2 diabetes mellitus with diabetic peripheral angiopathy without gangrene: Secondary | ICD-10-CM | POA: Diagnosis not present

## 2021-12-20 DIAGNOSIS — I129 Hypertensive chronic kidney disease with stage 1 through stage 4 chronic kidney disease, or unspecified chronic kidney disease: Secondary | ICD-10-CM | POA: Insufficient documentation

## 2021-12-20 DIAGNOSIS — F32A Depression, unspecified: Secondary | ICD-10-CM | POA: Insufficient documentation

## 2021-12-20 DIAGNOSIS — N183 Chronic kidney disease, stage 3 unspecified: Secondary | ICD-10-CM | POA: Diagnosis not present

## 2021-12-20 DIAGNOSIS — E1142 Type 2 diabetes mellitus with diabetic polyneuropathy: Secondary | ICD-10-CM | POA: Insufficient documentation

## 2021-12-20 DIAGNOSIS — M205X1 Other deformities of toe(s) (acquired), right foot: Secondary | ICD-10-CM | POA: Diagnosis present

## 2021-12-20 DIAGNOSIS — M2041 Other hammer toe(s) (acquired), right foot: Secondary | ICD-10-CM | POA: Insufficient documentation

## 2021-12-20 DIAGNOSIS — I272 Pulmonary hypertension, unspecified: Secondary | ICD-10-CM | POA: Diagnosis not present

## 2021-12-20 HISTORY — DX: Anemia, unspecified: D64.9

## 2021-12-20 HISTORY — DX: Thrombocytopenia, unspecified: D69.6

## 2021-12-20 HISTORY — DX: Tremor, unspecified: R25.1

## 2021-12-20 HISTORY — DX: Other symptoms and signs involving cognitive functions and awareness: R41.89

## 2021-12-20 HISTORY — PX: AMPUTATION TOE: SHX6595

## 2021-12-20 HISTORY — PX: FLEXOR TENOTOMY: SHX6342

## 2021-12-20 HISTORY — DX: Depression, unspecified: F32.A

## 2021-12-20 HISTORY — DX: Migraine, unspecified, not intractable, without status migrainosus: G43.909

## 2021-12-20 HISTORY — DX: Type 2 diabetes mellitus without complications: E11.9

## 2021-12-20 HISTORY — DX: Personal history of other diseases of the nervous system and sense organs: Z86.69

## 2021-12-20 HISTORY — DX: Presbycusis, unspecified ear: H91.10

## 2021-12-20 HISTORY — DX: Chronic kidney disease, stage 3 unspecified: N18.30

## 2021-12-20 HISTORY — DX: Hyperlipidemia, unspecified: E78.5

## 2021-12-20 SURGERY — AMPUTATION, TOE
Anesthesia: General | Site: Toe | Laterality: Right

## 2021-12-20 MED ORDER — LIDOCAINE HCL (CARDIAC) PF 100 MG/5ML IV SOSY
PREFILLED_SYRINGE | INTRAVENOUS | Status: DC | PRN
  Administered 2021-12-20: 100 mg via INTRAVENOUS

## 2021-12-20 MED ORDER — CHLORHEXIDINE GLUCONATE 0.12 % MT SOLN: 15.0000 mL | Freq: Once | OROMUCOSAL | Status: AC

## 2021-12-20 MED ORDER — ORAL CARE MOUTH RINSE: 15.0000 mL | Freq: Once | OROMUCOSAL | Status: AC

## 2021-12-20 MED ORDER — SUCCINYLCHOLINE CHLORIDE 200 MG/10ML IV SOSY
PREFILLED_SYRINGE | INTRAVENOUS | Status: DC | PRN
  Administered 2021-12-20: 140 mg via INTRAVENOUS

## 2021-12-20 MED ORDER — HYDROCODONE-ACETAMINOPHEN 5-325 MG PO TABS: 1.0000 | ORAL_TABLET | ORAL | 0 refills | Status: DC | PRN

## 2021-12-20 MED ORDER — ONDANSETRON HCL 4 MG/2ML IJ SOLN
INTRAMUSCULAR | Status: DC | PRN
  Administered 2021-12-20: 4 mg via INTRAVENOUS

## 2021-12-20 MED ORDER — FAMOTIDINE 20 MG PO TABS: 20.0000 mg | ORAL_TABLET | Freq: Once | ORAL | Status: AC

## 2021-12-20 MED ORDER — SODIUM CHLORIDE 0.9 % IV SOLN: INTRAVENOUS | Status: DC

## 2021-12-20 MED ORDER — FENTANYL CITRATE (PF) 100 MCG/2ML IJ SOLN
INTRAMUSCULAR | Status: AC
  Filled 2021-12-20: qty 2

## 2021-12-20 MED ORDER — 0.9 % SODIUM CHLORIDE (POUR BTL) OPTIME
TOPICAL | Status: DC | PRN
  Administered 2021-12-20: 200 mL

## 2021-12-20 MED ORDER — PHENYLEPHRINE HCL (PRESSORS) 10 MG/ML IV SOLN
INTRAVENOUS | Status: DC | PRN
  Administered 2021-12-20 (×2): 160 ug via INTRAVENOUS
  Administered 2021-12-20: 80 ug via INTRAVENOUS

## 2021-12-20 MED ORDER — ACETAMINOPHEN 10 MG/ML IV SOLN
INTRAVENOUS | Status: DC | PRN
  Administered 2021-12-20: 1000 mg via INTRAVENOUS

## 2021-12-20 MED ORDER — OXYCODONE HCL 5 MG PO TABS: 5.0000 mg | ORAL_TABLET | Freq: Once | ORAL | Status: DC | PRN

## 2021-12-20 MED ORDER — BUPIVACAINE HCL (PF) 0.5 % IJ SOLN
INTRAMUSCULAR | Status: AC
  Filled 2021-12-20: qty 30

## 2021-12-20 MED ORDER — EPHEDRINE SULFATE (PRESSORS) 50 MG/ML IJ SOLN
INTRAMUSCULAR | Status: DC | PRN
  Administered 2021-12-20: 5 mg via INTRAVENOUS
  Administered 2021-12-20 (×2): 10 mg via INTRAVENOUS

## 2021-12-20 MED ORDER — PROPOFOL 10 MG/ML IV BOLUS
INTRAVENOUS | Status: AC
  Filled 2021-12-20: qty 60

## 2021-12-20 MED ORDER — FENTANYL CITRATE (PF) 100 MCG/2ML IJ SOLN
INTRAMUSCULAR | Status: DC | PRN
  Administered 2021-12-20: 50 ug via INTRAVENOUS

## 2021-12-20 MED ORDER — ACETAMINOPHEN 10 MG/ML IV SOLN
INTRAVENOUS | Status: AC
  Filled 2021-12-20: qty 100

## 2021-12-20 MED ORDER — PROPOFOL 10 MG/ML IV BOLUS
INTRAVENOUS | Status: DC | PRN
  Administered 2021-12-20: 150 mg via INTRAVENOUS

## 2021-12-20 MED ORDER — DEXAMETHASONE SODIUM PHOSPHATE 10 MG/ML IJ SOLN
INTRAMUSCULAR | Status: DC | PRN
  Administered 2021-12-20: 5 mg via INTRAVENOUS

## 2021-12-20 MED ORDER — VANCOMYCIN HCL 1500 MG/300ML IV SOLN
1500.0000 mg | INTRAVENOUS | Status: DC
  Filled 2021-12-20: qty 300

## 2021-12-20 MED ORDER — BUPIVACAINE HCL 0.5 % IJ SOLN
INTRAMUSCULAR | Status: DC | PRN
  Administered 2021-12-20: 6 mL

## 2021-12-20 MED ORDER — OXYCODONE HCL 5 MG/5ML PO SOLN: 5.0000 mg | Freq: Once | ORAL | Status: DC | PRN

## 2021-12-20 MED ORDER — NEOMYCIN-POLYMYXIN B GU 40-200000 IR SOLN
Status: AC
  Filled 2021-12-20: qty 20

## 2021-12-20 MED ORDER — GLYCOPYRROLATE 0.2 MG/ML IJ SOLN
INTRAMUSCULAR | Status: DC | PRN
  Administered 2021-12-20: .2 mg via INTRAVENOUS

## 2021-12-20 MED ORDER — CHLORHEXIDINE GLUCONATE 0.12 % MT SOLN
OROMUCOSAL | Status: AC
  Administered 2021-12-20: 15 mL via OROMUCOSAL
  Filled 2021-12-20: qty 15

## 2021-12-20 MED ORDER — FAMOTIDINE 20 MG PO TABS
ORAL_TABLET | ORAL | Status: AC
  Administered 2021-12-20: 20 mg via ORAL
  Filled 2021-12-20: qty 1

## 2021-12-20 MED ORDER — FENTANYL CITRATE (PF) 100 MCG/2ML IJ SOLN: 25.0000 ug | INTRAMUSCULAR | Status: DC | PRN

## 2021-12-20 MED ORDER — VANCOMYCIN HCL 1500 MG/300ML IV SOLN
1500.0000 mg | INTRAVENOUS | Status: AC
  Administered 2021-12-20: 1500 mg via INTRAVENOUS
  Filled 2021-12-20: qty 300

## 2021-12-20 SURGICAL SUPPLY — 35 items
BLADE SURG 15 STRL LF DISP TIS (BLADE) ×2 IMPLANT
BLADE SURG 15 STRL SS (BLADE) ×4
BLADE SURG MINI STRL (BLADE) ×2 IMPLANT
BNDG CMPR 75X21 PLY HI ABS (MISCELLANEOUS) ×1
BNDG ELASTIC 4X5.8 VLCR STR LF (GAUZE/BANDAGES/DRESSINGS) ×2 IMPLANT
BNDG ESMARK 4X12 TAN STRL LF (GAUZE/BANDAGES/DRESSINGS) ×2 IMPLANT
BNDG GAUZE DERMACEA FLUFF (GAUZE/BANDAGES/DRESSINGS) ×1
BNDG GAUZE DERMACEA FLUFF 4 (GAUZE/BANDAGES/DRESSINGS) ×1 IMPLANT
BNDG GZE DERMACEA 4 6PLY (GAUZE/BANDAGES/DRESSINGS) ×1
DURAPREP 26ML APPLICATOR (WOUND CARE) ×2 IMPLANT
GAUZE SPONGE 4X4 12PLY STRL (GAUZE/BANDAGES/DRESSINGS) ×2 IMPLANT
GAUZE STRETCH 2X75IN STRL (MISCELLANEOUS) ×2 IMPLANT
GAUZE XEROFORM 1X8 LF (GAUZE/BANDAGES/DRESSINGS) ×2 IMPLANT
GLOVE BIO SURGEON STRL SZ7.5 (GLOVE) ×4 IMPLANT
GLOVE SURG UNDER LTX SZ8 (GLOVE) ×4 IMPLANT
GOWN STRL REUS W/ TWL LRG LVL3 (GOWN DISPOSABLE) ×2 IMPLANT
GOWN STRL REUS W/TWL LRG LVL3 (GOWN DISPOSABLE) ×6
KIT TURNOVER KIT A (KITS) ×2 IMPLANT
LABEL OR SOLS (LABEL) ×2 IMPLANT
NDL FILTER BLUNT 18X1 1/2 (NEEDLE) ×1 IMPLANT
NDL HYPO 25X1 1.5 SAFETY (NEEDLE) ×2 IMPLANT
NEEDLE FILTER BLUNT 18X 1/2SAF (NEEDLE) ×1
NEEDLE FILTER BLUNT 18X1 1/2 (NEEDLE) ×1 IMPLANT
NEEDLE HYPO 25X1 1.5 SAFETY (NEEDLE) ×4 IMPLANT
NS IRRIG 500ML POUR BTL (IV SOLUTION) ×2 IMPLANT
PACK EXTREMITY ARMC (MISCELLANEOUS) ×2 IMPLANT
SOL PREP PVP 2OZ (MISCELLANEOUS) ×2
SOLUTION PREP PVP 2OZ (MISCELLANEOUS) ×1 IMPLANT
STOCKINETTE STRL 6IN 960660 (GAUZE/BANDAGES/DRESSINGS) ×2 IMPLANT
SUT ETHILON 3-0 FS-10 30 BLK (SUTURE) ×2
SUT ETHILON 4-0 (SUTURE) ×2
SUT ETHILON 4-0 FS2 18XMFL BLK (SUTURE) ×1
SUTURE EHLN 3-0 FS-10 30 BLK (SUTURE) ×1 IMPLANT
SUTURE ETHLN 4-0 FS2 18XMF BLK (SUTURE) IMPLANT
SYR 10ML LL (SYRINGE) ×2 IMPLANT

## 2021-12-20 NOTE — Anesthesia Procedure Notes (Signed)
Procedure Name: Intubation Date/Time: 12/20/2021 7:36 AM  Performed by: Henrietta Hoover, CRNAPre-anesthesia Checklist: Patient identified, Emergency Drugs available, Suction available and Patient being monitored Patient Re-evaluated:Patient Re-evaluated prior to induction Oxygen Delivery Method: Circle system utilized Preoxygenation: Pre-oxygenation with 100% oxygen Induction Type: IV induction Ventilation: Mask ventilation without difficulty Laryngoscope Size: McGraph and 4 Grade View: Grade I Tube type: Oral Tube size: 7.5 mm Number of attempts: 1 Airway Equipment and Method: Stylet and Video-laryngoscopy Placement Confirmation: ETT inserted through vocal cords under direct vision, positive ETCO2 and breath sounds checked- equal and bilateral Secured at: 22 cm Tube secured with: Tape Dental Injury: Teeth and Oropharynx as per pre-operative assessment

## 2021-12-20 NOTE — Transfer of Care (Signed)
Immediate Anesthesia Transfer of Care Note  Patient: Brian Kramer  Procedure(s) Performed: AMPUTATION TOE - SECOND AND THIRD - [phone] (Right: Toe) FLEXOR TENOTOMY - FOURTH TOE - 28010 (Right: Toe)  Patient Location: PACU  Anesthesia Type:General  Level of Consciousness: awake, drowsy and patient cooperative  Airway & Oxygen Therapy: Patient Spontanous Breathing and Patient connected to face mask oxygen  Post-op Assessment: Report given to RN and Post -op Vital signs reviewed and stable  Post vital signs: Reviewed and stable  Last Vitals:  Vitals Value Taken Time  BP 120/53 12/20/21 0821  Temp    Pulse 85 12/20/21 0826  Resp 15 12/20/21 0826  SpO2 98 % 12/20/21 0826  Vitals shown include unvalidated device data.  Last Pain:  Vitals:   12/20/21 0646  TempSrc: Oral  PainSc: 0-No pain         Complications: No notable events documented.

## 2021-12-20 NOTE — Interval H&P Note (Signed)
History and Physical Interval Note:  12/20/2021 7:13 AM  Brian Kramer  has presented today for surgery, with the diagnosis of M20.41 - Hammertoe.  The various methods of treatment have been discussed with the patient and family. After consideration of risks, benefits and other options for treatment, the patient has consented to  Procedure(s): AMPUTATION TOE - SECOND AND THIRD - [phone] (Right) FLEXOR TENOTOMY - FOURTH TOE - 28010 (Right) as a surgical intervention.  The patient's history has been reviewed, patient examined, no change in status, stable for surgery.  I have reviewed the patient's chart and labs.  Questions were answered to the patient's satisfaction.     Ricci Barker

## 2021-12-20 NOTE — Op Note (Signed)
Date of operation: 12/20/2021.  Surgeon: Ricci Barker D.P.M.  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  Postoperative diagnosis same.  Procedures: 1.  Distal amputation right second toe.  #2 distal amputation right third toe.  #3 percutaneous tenotomy right fourth toe.  Anesthesia: General with local.  Hemostasis: Esmarch tourniquet right ankle.  Estimated blood loss 5 cc.  Pathology: None.  Complications: None apparent.  Operative indications: This is a 76 year old male with previous hammertoe corrections on his right second and third toes.  Subsequently developed mallet toes distally as well as hammertoe on the fourth toe.  Previously has had implant placed for fusion at the PIPJ so decision is made for distal amputation of the second and third toes with tenotomy of the fourth toe.  Operative procedure: Patient was taken to the operating room and placed on the table in supine position.  Following satisfactory anesthesia the right second, third, and fourth toes were anesthetized with 6 cc of 0.5% Marcaine plain.  The foot was prepped and draped in the usual sterile fashion.  The foot was exsanguinated using an Esmarch which was then left around the ankle to act as a tourniquet.  Attention was directed to the distal aspect of the right second and third toes where a fishmouth type incision was made coursing medial to lateral just proximal to the nail plate dorsally and across the plantar pad.  Incision carried down to the level of the bone where the toe was disarticulated at the DIPJ and removed.  Both toes were flushed with copious amounts of sterile saline and then closed using 4-0 nylon simple interrupted sutures.  Tourniquet was released and there was noted to be adequate blood flow to all digits.  At this point an 18-gauge was placed into the plantar aspect of the distal fourth toe and used to incise the flexor tendon to  the fourth toe releasing the contracture in the digit.  Xeroform 4 x 4's gauze and conformer applied to the right foot followed by Kerlix and an Ace wrap.  Patient was awakened and transported the PACU having tolerated the anesthesia and procedures well.

## 2021-12-20 NOTE — Anesthesia Preprocedure Evaluation (Addendum)
Anesthesia Evaluation  Patient identified by MRN, date of birth, ID band Patient awake    Reviewed: Allergy & Precautions, NPO status , Patient's Chart, lab work & pertinent test results  Airway Mallampati: I  TM Distance: >3 FB Neck ROM: full    Dental  (+) Upper Dentures, Lower Dentures   Pulmonary neg pulmonary ROS, COPD,  oxygen dependent, former smoker,    Pulmonary exam normal        Cardiovascular hypertension, + CAD, + Past MI and + Peripheral Vascular Disease  negative cardio ROS Normal cardiovascular exam Rhythm:regular Rate:Normal     Neuro/Psych  Headaches, PSYCHIATRIC DISORDERS Depression negative neurological ROS  negative psych ROS   GI/Hepatic negative GI ROS, Neg liver ROS, hiatal hernia, GERD  ,  Endo/Other  negative endocrine ROSdiabetes  Renal/GU      Musculoskeletal   Abdominal   Peds  Hematology negative hematology ROS (+)   Anesthesia Other Findings Past Medical History: No date: Anemia No date: Anginal pain (HCC) No date: Arthritis No date: CKD (chronic kidney disease), stage III (HCC) No date: Cognitive impairment No date: COPD (chronic obstructive pulmonary disease) (HCC) No date: Coronary artery disease     Comment:  a.) MI 03/1995 - no PCI; b.) LHC/PCI 1997 - stent               (unknown type) x 1 to RCA; c.) LHC/PCI 2000 - stent               (unknown type) x 1 to LCx; d.) LHC 04/02/2010 - insig CAD              with widely patent stents; medical mgmt; e.) LHC               04/30/2015 - patent stents; insig CAD; medical mgmt; f.)               R/LHC 05/01/20: EF 60%, norm cors. No date: Depression 09/05/2021: Diastolic dysfunction     Comment:  a.)  TTE 09/05/2021: EF 50%, mild BAE, trivial PR, mild               MR/TR; pHTN (RVSP 50 mmHg), G1DD. 04/08/2021: Essential hypertension No date: GERD (gastroesophageal reflux disease)     Comment:  a.) improved s/p sleeve gastrectomy;  no daily Tx 2004: History of hiatal hernia     Comment:  a.) s/p repair No date: History of obstructive sleep apnea     Comment:  a.) resolved s/p sleeve gastrectomy; no longer requires               nocturnal PAP therapy No date: HLD (hyperlipidemia) No date: Insomnia No date: Migraines 03/1995: Myocardial infarction [facility])     Comment:  a.) full details unknown; LHC performed, however no PCI               performed. 04/08/2021: PAD (peripheral artery disease) (HCC) No date: Peripheral neuropathy No date: Presbycusis No date: PUD (peptic ulcer disease) No date: Pulmonary hypertension (HCC)     Comment:  a.) takes ambrisentan; b.)  R/LHC 05/01/2020: EF 60%,               mean PA 30, mean PCWP 16, RVEDP 19, Ao sat 95, Pa sat 74,              CO 8.2, PVR 137, SVR 736; c.)  TTE 09/05/2021: EF 50%,  RVSP 50 mmHg. No date: RLS (restless legs syndrome)     Comment:  a.) takes ropinirole No date: Thrombocytopenia (HCC) No date: Tremor No date: Type 2 diabetes, diet controlled (HCC)  Past Surgical History: 04/08/2018: CARPOMETACARPAL ([facility]) FUSION OF THUMB; Right     Comment:  Procedure: CARPOMETACARPAL [facility]) FUSION OF THUMB;                Surgeon: Kennedy Bucker, MD;  Location: [facility] ORS;                Service: Orthopedics;  Laterality: Right; 12/02/2018: CARPOMETACARPAL ([facility]) FUSION OF THUMB; Left     Comment:  Procedure: CARPOMETACARPAL ([facility]) FUSION OF  LEFT THUMB,               DIABETIC;  Surgeon: Kennedy Bucker, MD;  Location: [facility]               ORS;  Service: Orthopedics;  Laterality: Left; 2015: CATARACT EXTRACTION; Bilateral No date: COLONOSCOPY 05/19/2018: COLONOSCOPY WITH PROPOFOL; N/A     Comment:  Procedure: COLONOSCOPY WITH PROPOFOL;  Surgeon: [place],               Boykin Nearing, MD;  Location: [facility] ENDOSCOPY;  Service:               Gastroenterology;  Laterality: N/A; 1997: CORONARY ANGIOPLASTY WITH STENT PLACEMENT; Left     Comment:  Procedure: CORONARY  ANGIOPLASTY WITH STENT PLACEMENT;               stent x1 (unknown type) to RCA 2000: CORONARY ANGIOPLASTY WITH STENT PLACEMENT; Left     Comment:  Procedure: CORONARY ANGIOPLASTY WITH STENT PLACEMENT;               stent x1 (unknown type) to LCx 10/04/2021: EXCISION PARTIAL PHALANX; Left     Comment:  Procedure: EXCISION PARTIAL PHALANX - 2, 3, 4;  Surgeon:              Linus Galas, DPM;  Location: [facility] ORS;  Service:               Podiatry;  Laterality: Left; 01/15/2018: HAMMER TOE SURGERY; Right     Comment:  Procedure: HAMMER TOE CORRECTION-2ND & 3RD;  Surgeon:               Recardo Evangelist, DPM;  Location: [facility] ORS;  Service:               Podiatry;  Laterality: Right; 2012: HEEL SPUR RESECTION No date: INGUINAL HERNIA REPAIR; Bilateral 2005: KNEE ARTHROSCOPY; Right 2006: KNEE ARTHROSCOPY; Left 03/17/2016: LAPAROSCOPIC GASTRIC SLEEVE RESECTION; N/A 04/02/2010: LEFT HEART CATH AND CORONARY ANGIOGRAPHY; Left     Comment:  Procedure: LEFT HEART CATH AND CORONARY ANGIOGRAPHY;               Location: [facility]; Surgeon: Phil Dopp, MD 04/30/2015: LEFT HEART CATH AND CORONARY ANGIOGRAPHY; Left     Comment:  Procedure: LEFT HEART CATH AND CORONARY ANGIOGRAPHY;               Location: [facility]; Surgeon: Aletha Halim, MD 05/01/2020: RIGHT AND LEFT HEART CATH; Left     Comment:  Procedure: RIGHT AND LEFT HEART CATH;  Surgeon:               Alwyn Pea, MD;  Location: [facility] INVASIVE CV LAB;               Service: Cardiovascular;  Laterality:  Left; No date: Right shoulder AC separation surgery in 1995; Right 2017: Stem Cells to left heel No date: TONSILLECTOMY 2007: TOTAL KNEE ARTHROPLASTY; Left 2015: TOTAL KNEE ARTHROPLASTY; Right 04/14/2019: ULNAR TUNNEL RELEASE; Left     Comment:  Procedure: LEFT CUBITAL TUNNEL RELEASE;  Surgeon: Hessie Knows, MD;  Location: [facility] ORS;  Service: Orthopedics;               Laterality: Left; 2017: UPPER GI ENDOSCOPY No date: VASECTOMY      Comment:  1970s 01/15/2018: WEIL OSTEOTOMY; Right     Comment:  Procedure: [place];  Surgeon: Albertine Patricia, DPM;  Location: [facility] ORS;  Service: Podiatry;                Laterality: Right;  BMI    Body Mass Index: 36.92 kg/m    PMH of PHTN  Reproductive/Obstetrics negative OB ROS                           Anesthesia Physical Anesthesia Plan  ASA: 3  Anesthesia Plan: General/Spinal   Post-op Pain Management:    Induction: Intravenous  PONV Risk Score and Plan: 2 and Dexamethasone, Ondansetron, Midazolam and Treatment may vary due to age or medical condition  Airway Management Planned: Oral ETT  Additional Equipment:   Intra-op Plan:   Post-operative Plan: Extubation in OR  Informed Consent: I have reviewed the patients History and Physical, chart, labs and discussed the procedure including the risks, benefits and alternatives for the proposed anesthesia with the patient or authorized representative who has indicated his/her understanding and acceptance.     Dental Advisory Given  Plan Discussed with: Anesthesiologist, CRNA and Surgeon  Anesthesia Plan Comments: (Patient consented for risks of anesthesia including but not limited to:  - adverse reactions to medications - damage to eyes, teeth, lips or other oral mucosa - nerve damage due to positioning  - sore throat or hoarseness - Damage to heart, brain, nerves, lungs, other parts of body or loss of life  Patient voiced understanding.)     Anesthesia Quick Evaluation

## 2021-12-20 NOTE — Discharge Instructions (Addendum)
1.  Elevate the right lower extremity on pillows.  2.  Keep the bandage on the right foot clean, dry, and do not remove.  3.  Sponge bathe only right lower extremity.  4.  Wear surgical shoe on the right foot whenever walking or standing.  5.  Take 1 pain pill, Norco, every 4 hours only if needed for pain.   AMBULATORY SURGERY  DISCHARGE INSTRUCTIONS   The drugs that you were given will stay in your system until tomorrow so for the next 24 hours you should not:  Drive an automobile Make any legal decisions Drink any alcoholic beverage   You may resume regular meals tomorrow.  Today it is better to start with liquids and gradually work up to solid foods.  You may eat anything you prefer, but it is better to start with liquids, then soup and crackers, and gradually work up to solid foods.   Please notify your doctor immediately if you have any unusual bleeding, trouble breathing, redness and pain at the surgery site, drainage, fever, or pain not relieved by medication.    Additional Instructions:     Please contact your physician with any problems or Same Day Surgery at [phone], Monday through Friday 6 am to 4 pm, or [place] at [facility] number at [phone].

## 2021-12-20 NOTE — H&P (Signed)
Subjective: Patient presents today for surgery on the right foot.  Objective: Neurovascular status intact.  Chronically contracted mallet toes right second and third toes with hammertoe contracture of the fourth toe.  Previous hammertoe correction surgery on the second and third toes.  Assessment: Chronic hammertoe/mallet toe deformities right foot.  Plan: Medical history and physical in the chart reviewed.  No prior changes from office examination.  Patient stable to proceed with surgery for distal amputations right second and third toes and tenotomy fourth toe.

## 2021-12-20 NOTE — Anesthesia Postprocedure Evaluation (Signed)
Anesthesia Post Note  Patient: Kerri Asche  Procedure(s) Performed: AMPUTATION TOE - SECOND AND THIRD - [phone] (Right: Toe) FLEXOR TENOTOMY - FOURTH TOE - 28010 (Right: Toe)  Patient location during evaluation: PACU Anesthesia Type: Combined General/Spinal Level of consciousness: awake and alert Pain management: pain level controlled Vital Signs Assessment: post-procedure vital signs reviewed and stable Respiratory status: spontaneous breathing, nonlabored ventilation, respiratory function stable and patient connected to nasal cannula oxygen Cardiovascular status: blood pressure returned to baseline and stable Postop Assessment: no apparent nausea or vomiting Anesthetic complications: no   No notable events documented.   Last Vitals:  Vitals:   12/20/21 0830 12/20/21 0845  BP: (!) 111/56 (!) 103/50  Pulse: 88 80  Resp: 17 16  Temp:  36.5 C  SpO2: 94% 96%    Last Pain:  Vitals:   12/20/21 0845  TempSrc:   PainSc: 0-No pain                 Stephanie Coup

## 2021-12-20 NOTE — OR Nursing (Signed)
Esmark applied as tourniquet at 0744 and removed at [phone]

## 2022-01-02 DIAGNOSIS — I272 Pulmonary hypertension, unspecified: Secondary | ICD-10-CM | POA: Diagnosis present

## 2022-04-07 ENCOUNTER — Encounter (INDEPENDENT_AMBULATORY_CARE_PROVIDER_SITE_OTHER): Payer: Self-pay

## 2022-06-23 DIAGNOSIS — J449 Chronic obstructive pulmonary disease, unspecified: Secondary | ICD-10-CM | POA: Insufficient documentation

## 2022-06-25 ENCOUNTER — Inpatient Hospital Stay
Admission: EM | Admit: 2022-06-25 | Discharge: 2022-06-28 | DRG: 177 | Disposition: A | Discharge status: Home Health | Payer: Medicare PPO | Attending: Internal Medicine | Admitting: Internal Medicine

## 2022-06-25 ENCOUNTER — Emergency Department: Payer: Medicare PPO

## 2022-06-25 ENCOUNTER — Encounter: Payer: Self-pay | Admitting: Internal Medicine

## 2022-06-25 ENCOUNTER — Other Ambulatory Visit: Payer: Self-pay

## 2022-06-25 DIAGNOSIS — Z955 Presence of coronary angioplasty implant and graft: Secondary | ICD-10-CM | POA: Diagnosis not present

## 2022-06-25 DIAGNOSIS — F0390 Unspecified dementia without behavioral disturbance: Secondary | ICD-10-CM | POA: Diagnosis present

## 2022-06-25 DIAGNOSIS — R0602 Shortness of breath: Secondary | ICD-10-CM

## 2022-06-25 DIAGNOSIS — Z9884 Bariatric surgery status: Secondary | ICD-10-CM | POA: Diagnosis not present

## 2022-06-25 DIAGNOSIS — K219 Gastro-esophageal reflux disease without esophagitis: Secondary | ICD-10-CM | POA: Diagnosis present

## 2022-06-25 DIAGNOSIS — Z89421 Acquired absence of other right toe(s): Secondary | ICD-10-CM

## 2022-06-25 DIAGNOSIS — U071 COVID-19: Secondary | ICD-10-CM | POA: Diagnosis present

## 2022-06-25 DIAGNOSIS — I272 Pulmonary hypertension, unspecified: Secondary | ICD-10-CM | POA: Diagnosis present

## 2022-06-25 DIAGNOSIS — I959 Hypotension, unspecified: Secondary | ICD-10-CM | POA: Diagnosis present

## 2022-06-25 DIAGNOSIS — Z87891 Personal history of nicotine dependence: Secondary | ICD-10-CM

## 2022-06-25 DIAGNOSIS — Z91013 Allergy to seafood: Secondary | ICD-10-CM

## 2022-06-25 DIAGNOSIS — N1831 Chronic kidney disease, stage 3a: Secondary | ICD-10-CM | POA: Diagnosis present

## 2022-06-25 DIAGNOSIS — Z66 Do not resuscitate: Secondary | ICD-10-CM | POA: Diagnosis present

## 2022-06-25 DIAGNOSIS — J441 Chronic obstructive pulmonary disease with (acute) exacerbation: Secondary | ICD-10-CM | POA: Diagnosis present

## 2022-06-25 DIAGNOSIS — F0393 Unspecified dementia, unspecified severity, with mood disturbance: Secondary | ICD-10-CM | POA: Diagnosis present

## 2022-06-25 DIAGNOSIS — F32A Depression, unspecified: Secondary | ICD-10-CM | POA: Diagnosis present

## 2022-06-25 DIAGNOSIS — J1282 Pneumonia due to coronavirus disease 2019: Secondary | ICD-10-CM | POA: Diagnosis present

## 2022-06-25 DIAGNOSIS — I129 Hypertensive chronic kidney disease with stage 1 through stage 4 chronic kidney disease, or unspecified chronic kidney disease: Secondary | ICD-10-CM | POA: Diagnosis present

## 2022-06-25 DIAGNOSIS — E669 Obesity, unspecified: Secondary | ICD-10-CM | POA: Diagnosis present

## 2022-06-25 DIAGNOSIS — G2581 Restless legs syndrome: Secondary | ICD-10-CM | POA: Diagnosis present

## 2022-06-25 DIAGNOSIS — I252 Old myocardial infarction: Secondary | ICD-10-CM

## 2022-06-25 DIAGNOSIS — E785 Hyperlipidemia, unspecified: Secondary | ICD-10-CM | POA: Diagnosis present

## 2022-06-25 DIAGNOSIS — R54 Age-related physical debility: Secondary | ICD-10-CM | POA: Diagnosis present

## 2022-06-25 DIAGNOSIS — Z7951 Long term (current) use of inhaled steroids: Secondary | ICD-10-CM

## 2022-06-25 DIAGNOSIS — J9621 Acute and chronic respiratory failure with hypoxia: Secondary | ICD-10-CM | POA: Diagnosis present

## 2022-06-25 DIAGNOSIS — N179 Acute kidney failure, unspecified: Secondary | ICD-10-CM | POA: Diagnosis present

## 2022-06-25 DIAGNOSIS — I2510 Atherosclerotic heart disease of native coronary artery without angina pectoris: Secondary | ICD-10-CM | POA: Diagnosis present

## 2022-06-25 DIAGNOSIS — Z96653 Presence of artificial knee joint, bilateral: Secondary | ICD-10-CM | POA: Diagnosis present

## 2022-06-25 DIAGNOSIS — E1151 Type 2 diabetes mellitus with diabetic peripheral angiopathy without gangrene: Secondary | ICD-10-CM | POA: Diagnosis present

## 2022-06-25 DIAGNOSIS — Z7982 Long term (current) use of aspirin: Secondary | ICD-10-CM

## 2022-06-25 DIAGNOSIS — I739 Peripheral vascular disease, unspecified: Secondary | ICD-10-CM | POA: Diagnosis present

## 2022-06-25 DIAGNOSIS — Z79899 Other long term (current) drug therapy: Secondary | ICD-10-CM

## 2022-06-25 DIAGNOSIS — E1122 Type 2 diabetes mellitus with diabetic chronic kidney disease: Secondary | ICD-10-CM | POA: Diagnosis present

## 2022-06-25 DIAGNOSIS — Z6839 Body mass index (BMI) 39.0-39.9, adult: Secondary | ICD-10-CM

## 2022-06-25 DIAGNOSIS — J9612 Chronic respiratory failure with hypercapnia: Secondary | ICD-10-CM | POA: Diagnosis present

## 2022-06-25 DIAGNOSIS — Z880 Allergy status to penicillin: Secondary | ICD-10-CM

## 2022-06-25 DIAGNOSIS — I10 Essential (primary) hypertension: Secondary | ICD-10-CM | POA: Diagnosis present

## 2022-06-25 LAB — CBC
HCT: 36 % — ABNORMAL LOW (ref 39.0–52.0)
Hemoglobin: 11.7 g/dL — ABNORMAL LOW (ref 13.0–17.0)
MCH: 30.8 pg (ref 26.0–34.0)
MCHC: 32.5 g/dL (ref 30.0–36.0)
MCV: 94.7 fL (ref 80.0–100.0)
Platelets: 124 10*3/uL — ABNORMAL LOW (ref 150–400)
RBC: 3.8 MIL/uL — ABNORMAL LOW (ref 4.22–5.81)
RDW: 13.9 % (ref 11.5–15.5)
WBC: 7.4 10*3/uL (ref 4.0–10.5)
nRBC: 0 % (ref 0.0–0.2)

## 2022-06-25 LAB — URINALYSIS, COMPLETE (UACMP) WITH MICROSCOPIC
Bacteria, UA: NONE SEEN
Bilirubin Urine: NEGATIVE
Glucose, UA: NEGATIVE mg/dL
Hgb urine dipstick: NEGATIVE
Ketones, ur: 5 mg/dL — AB
Leukocytes,Ua: NEGATIVE
Nitrite: NEGATIVE
Protein, ur: 30 mg/dL — AB
Specific Gravity, Urine: 1.018 (ref 1.005–1.030)
Squamous Epithelial / HPF: NONE SEEN /HPF (ref 0–5)
pH: 5 (ref 5.0–8.0)

## 2022-06-25 LAB — BASIC METABOLIC PANEL
Anion gap: 9 (ref 5–15)
BUN: 25 mg/dL — ABNORMAL HIGH (ref 8–23)
CO2: 22 mmol/L (ref 22–32)
Calcium: 9.2 mg/dL (ref 8.9–10.3)
Chloride: 105 mmol/L (ref 98–111)
Creatinine, Ser: 1.39 mg/dL — ABNORMAL HIGH (ref 0.61–1.24)
GFR, Estimated: 52 mL/min — ABNORMAL LOW (ref >60)
Glucose, Bld: 142 mg/dL — ABNORMAL HIGH (ref 70–99)
Potassium: 4.7 mmol/L (ref 3.5–5.1)
Sodium: 136 mmol/L (ref 135–145)

## 2022-06-25 LAB — HEPATIC FUNCTION PANEL
ALT: 15 U/L (ref 0–44)
AST: 24 U/L (ref 15–41)
Albumin: 3.9 g/dL (ref 3.5–5.0)
Alkaline Phosphatase: 70 U/L (ref 38–126)
Bilirubin, Direct: <0.1 mg/dL (ref 0.0–0.2)
Total Bilirubin: 0.6 mg/dL (ref 0.3–1.2)
Total Protein: 7.2 g/dL (ref 6.5–8.1)

## 2022-06-25 LAB — RESP PANEL BY RT-PCR (RSV, FLU A&B, COVID)  RVPGX2
Influenza A by PCR: NEGATIVE
Influenza B by PCR: NEGATIVE
Resp Syncytial Virus by PCR: NEGATIVE
SARS Coronavirus 2 by RT PCR: POSITIVE — AB

## 2022-06-25 LAB — PROTIME-INR
INR: 1.1 (ref 0.8–1.2)
Prothrombin Time: 14.1 seconds (ref 11.4–15.2)

## 2022-06-25 LAB — PROCALCITONIN: Procalcitonin: <0.1 ng/mL

## 2022-06-25 LAB — LACTIC ACID, PLASMA: Lactic Acid, Venous: 0.8 mmol/L (ref 0.5–1.9)

## 2022-06-25 LAB — APTT: aPTT: 28 seconds (ref 24–36)

## 2022-06-25 MED ORDER — HALOPERIDOL LACTATE 5 MG/ML IJ SOLN: 0.5000 mg | Freq: Four times a day (QID) | INTRAMUSCULAR | Status: DC | PRN

## 2022-06-25 MED ORDER — GUAIFENESIN-DM 100-10 MG/5ML PO SYRP: 10.0000 mL | ORAL_SOLUTION | ORAL | Status: DC | PRN

## 2022-06-25 MED ORDER — AMBRISENTAN 5 MG PO TABS
5.0000 mg | ORAL_TABLET | Freq: Every day | ORAL | Status: DC
  Administered 2022-06-27 – 2022-06-28 (×2): 5 mg via ORAL
  Filled 2022-06-25 (×2): qty 1

## 2022-06-25 MED ORDER — AMBRISENTAN 5 MG PO TABS: 5.0000 mg | ORAL_TABLET | Freq: Every day | ORAL | Status: DC

## 2022-06-25 MED ORDER — VITAMIN C 500 MG PO TABS
500.0000 mg | ORAL_TABLET | Freq: Every day | ORAL | Status: DC
  Administered 2022-06-25 – 2022-06-28 (×4): 500 mg via ORAL
  Filled 2022-06-25 (×4): qty 1

## 2022-06-25 MED ORDER — METHYLPREDNISOLONE SODIUM SUCC 125 MG IJ SOLR
125.0000 mg | INTRAMUSCULAR | Status: AC
  Administered 2022-06-25: 125 mg via INTRAVENOUS
  Filled 2022-06-25: qty 2

## 2022-06-25 MED ORDER — PREDNISONE 20 MG PO TABS: 50.0000 mg | ORAL_TABLET | Freq: Every day | ORAL | Status: DC

## 2022-06-25 MED ORDER — ALBUTEROL SULFATE HFA 108 (90 BASE) MCG/ACT IN AERS
2.0000 | INHALATION_SPRAY | Freq: Four times a day (QID) | RESPIRATORY_TRACT | Status: DC
  Administered 2022-06-25 – 2022-06-28 (×9): 2 via RESPIRATORY_TRACT
  Filled 2022-06-25: qty 6.7

## 2022-06-25 MED ORDER — ONDANSETRON HCL 4 MG PO TABS: 4.0000 mg | ORAL_TABLET | Freq: Four times a day (QID) | ORAL | Status: DC | PRN

## 2022-06-25 MED ORDER — IPRATROPIUM-ALBUTEROL 0.5-2.5 (3) MG/3ML IN SOLN
3.0000 mL | Freq: Once | RESPIRATORY_TRACT | Status: AC
  Administered 2022-06-25: 3 mL via RESPIRATORY_TRACT
  Filled 2022-06-25: qty 3

## 2022-06-25 MED ORDER — ALBUTEROL SULFATE HFA 108 (90 BASE) MCG/ACT IN AERS: 1.0000 | INHALATION_SPRAY | RESPIRATORY_TRACT | Status: DC | PRN

## 2022-06-25 MED ORDER — ACETAMINOPHEN 325 MG PO TABS
650.0000 mg | ORAL_TABLET | Freq: Four times a day (QID) | ORAL | Status: DC | PRN
  Administered 2022-06-27 (×2): 650 mg via ORAL
  Filled 2022-06-25 (×2): qty 2

## 2022-06-25 MED ORDER — SODIUM CHLORIDE 0.9 % IV BOLUS
500.0000 mL | Freq: Once | INTRAVENOUS | Status: AC
  Administered 2022-06-25: 500 mL via INTRAVENOUS

## 2022-06-25 MED ORDER — HYDROCOD POLI-CHLORPHE POLI ER 10-8 MG/5ML PO SUER
5.0000 mL | Freq: Two times a day (BID) | ORAL | Status: DC | PRN
  Administered 2022-06-28: 5 mL via ORAL
  Filled 2022-06-25: qty 5

## 2022-06-25 MED ORDER — METHYLPREDNISOLONE SODIUM SUCC 125 MG IJ SOLR: 120.0000 mg | INTRAMUSCULAR | Status: DC

## 2022-06-25 MED ORDER — ENOXAPARIN SODIUM 60 MG/0.6ML IJ SOSY
60.0000 mg | PREFILLED_SYRINGE | Freq: Every day | INTRAMUSCULAR | Status: DC
  Administered 2022-06-25 – 2022-06-27 (×3): 60 mg via SUBCUTANEOUS
  Filled 2022-06-25 (×3): qty 0.6

## 2022-06-25 MED ORDER — ONDANSETRON HCL 4 MG/2ML IJ SOLN: 4.0000 mg | Freq: Four times a day (QID) | INTRAMUSCULAR | Status: DC | PRN

## 2022-06-25 MED ORDER — SODIUM CHLORIDE 0.9 % IV SOLN
100.0000 mg | Freq: Every day | INTRAVENOUS | Status: DC
  Filled 2022-06-25: qty 20

## 2022-06-25 MED ORDER — SODIUM CHLORIDE 0.9 % IV SOLN
200.0000 mg | Freq: Once | INTRAVENOUS | Status: AC
  Administered 2022-06-25: 200 mg via INTRAVENOUS
  Filled 2022-06-25: qty 40

## 2022-06-25 MED ORDER — ZINC SULFATE 220 (50 ZN) MG PO CAPS
220.0000 mg | ORAL_CAPSULE | Freq: Every day | ORAL | Status: DC
  Administered 2022-06-25 – 2022-06-28 (×4): 220 mg via ORAL
  Filled 2022-06-25 (×4): qty 1

## 2022-06-25 MED ORDER — ACETAMINOPHEN 500 MG PO TABS
1000.0000 mg | ORAL_TABLET | ORAL | Status: AC
  Administered 2022-06-25: 1000 mg via ORAL

## 2022-06-25 MED ORDER — ACETAMINOPHEN 500 MG PO TABS
ORAL_TABLET | ORAL | Status: AC
  Filled 2022-06-25: qty 2

## 2022-06-25 NOTE — Assessment & Plan Note (Addendum)
Restless leg syndrome Depression Continue quetiapine, ropinirole Delirium precautions Palliative care consult

## 2022-06-25 NOTE — ED Provider Notes (Signed)
[facility] Provider Note    Event Date/Time   First MD Initiated Contact with Patient 06/25/22 1800     (approximate)   History   Shortness of Breath and COVID+   HPI  Denham is a 77 y.o. male history of COPD and pulmonary hypertension  Started developing chills and some shortness of breath yesterday.  Is having shortness of breath today fatigue nausea.  Went to clinic today and was noted to have low oxygen saturation and referred to the ED.  He and his daughter reports that he had a positive COVID home test and that his wife is also at home and has a been diagnosed with coronavirus  No chest pain  Positive for fever slight shortness of breath.  Typically uses oxygen when he sleeps at night but is now on it today because of the shortness of breath       Physical Exam   Triage Vital Signs: ED Triage Vitals  Enc Vitals Group     BP 06/25/22 1744 139/63     Pulse Rate 06/25/22 1744 96     Resp 06/25/22 1744 20     Temp 06/25/22 1744 (!) 102.6 F (39.2 C)     Temp Source 06/25/22 1744 Oral     SpO2 06/25/22 1744 (!) 89 %     Weight --      Height --      Head Circumference --      Peak Flow --      Pain Score 06/25/22 1742 0     Pain Loc --      Pain Edu? --      Excl. in [place]? --     Most recent vital signs: Vitals:   06/25/22 1930 06/25/22 1941  BP: 132/70   Pulse: 100   Resp: 20   Temp:  (!) 101.5 F (38.6 C)  SpO2: 92%      General: Awake, no distress.  He does appear however just slightly dyspneic.  Oxygen saturation 95% on 2 L nasal cannula CV:  Good peripheral perfusion.  Normal tones and rate Resp:  Normal effort.  Lung sounds are clear bilaterally but diminished in the bases.  No frank crackles or rails.  He has slight tachypnea but no accessory muscle use.  There is no wheezing present Abd:  No distention.  Soft nontender nondistended denies pain to palpation Other:  Warm and well-perfused extremities   ED  Results / Procedures / Treatments   Labs (all labs ordered are listed, but only abnormal results are displayed) Labs Reviewed  RESP PANEL BY RT-PCR (RSV, FLU A&B, COVID)  RVPGX2 - Abnormal; Notable for the following components:      Result Value   SARS Coronavirus 2 by RT PCR POSITIVE (*)    All other components within normal limits  BASIC METABOLIC PANEL - Abnormal; Notable for the following components:   Glucose, Bld 142 (*)    BUN 25 (*)    Creatinine, Ser 1.39 (*)    GFR, Estimated 52 (*)    All other components within normal limits  CBC - Abnormal; Notable for the following components:   RBC 3.80 (*)    Hemoglobin 11.7 (*)    HCT 36.0 (*)    Platelets 124 (*)    All other components within normal limits  CULTURE, BLOOD (ROUTINE X 2)  CULTURE, BLOOD (ROUTINE X 2)  LACTIC ACID, PLASMA  PROTIME-INR  APTT  LACTIC  ACID, PLASMA  URINALYSIS, COMPLETE (UACMP) WITH MICROSCOPIC  HEPATIC FUNCTION PANEL  PROCALCITONIN     EKG  Interpreted by me at 1835 heart rate 90 QRS 120 QTc 430 Normal sinus rhythm no evidence of acute ischemia   RADIOLOGY  Chest x-ray interpreted by me as possible increased bronchitic pattern or vascular congestion.  No frank edema or infiltrate is noted.    DG Chest 1 View  Result Date: 06/25/2022 CLINICAL DATA:  Short of breath, COVID positive EXAM: CHEST  1 VIEW COMPARISON:  None Available. FINDINGS: Single frontal view of the chest demonstrates an unremarkable cardiac silhouette. No airspace disease, effusion, or pneumothorax. Mild diffuse interstitial prominence could reflect sequela of viral pneumonitis. Elevated right hemidiaphragm. No acute bony abnormalities. IMPRESSION: 1. Diffuse interstitial prominence consistent with viral pneumonitis given history of COVID positivity. 2. No acute airspace disease. Electronically Signed   By: Randa Ngo M.D.   On: 06/25/2022 18:35      PROCEDURES:  Critical Care performed:  No  Procedures   MEDICATIONS ORDERED IN ED: Medications  methylPREDNISolone sodium succinate (SOLU-MEDROL) 125 mg/2 mL injection 125 mg (125 mg Intravenous Given 06/25/22 1824)  ipratropium-albuterol (DUONEB) 0.5-2.5 (3) MG/3ML nebulizer solution 3 mL (3 mLs Nebulization Given 06/25/22 1825)  acetaminophen (TYLENOL) tablet 1,000 mg (1,000 mg Oral Given 06/25/22 1833)  sodium chloride 0.9 % bolus 500 mL (500 mLs Intravenous New Bag/Given 06/25/22 1949)     IMPRESSION / MDM / ASSESSMENT AND PLAN / ED COURSE  I reviewed the triage vital signs and the nursing notes.                              Differential diagnosis includes, but is not limited to, respiratory infection, coronavirus influenza pneumonia, sepsis etc.  He has a new oxygen requirement with cough and shortness of breath and a high fever.  His wife has COVID and he self-reports a positive COVID test at the house.  This certainly seems in keeping with that.  He does not have frank or obvious COPD exacerbation but given his underlying history of COPD and pulmonary hypertension, particularly his COPD I will trial use of a nebulizer and Solu-Medrol as well.  Risks and benefits of Paxlovid discussed with patient, he is agreeable with proceeding with antiviral treatment as guided by the hospitalist service.  I discussed with Dr. Damita Dunnings, patient accepted and admission after consulting with the hospitalist.  The hospitalist will ultimately decide on if antiviral specific treatments are  Patient's presentation is most consistent with acute presentation with potential threat to life or bodily function.   The patient is on the cardiac monitor to evaluate for evidence of arrhythmia and/or significant heart rate changes.       FINAL CLINICAL IMPRESSION(S) / ED DIAGNOSES   Final diagnoses:  COVID-19  SOB (shortness of breath)     Rx / DC Orders   ED Discharge Orders     None        Note:  This document was prepared using  Dragon voice recognition software and may include unintentional dictation errors.   Delman Kitten, MD 06/25/22 2005

## 2022-06-25 NOTE — Assessment & Plan Note (Addendum)
Continue lisinopril, aspirin, rosuvastatin and nitroglycerin.

## 2022-06-25 NOTE — Assessment & Plan Note (Addendum)
Continue aspirin and rosuvastatin.

## 2022-06-25 NOTE — ED Notes (Signed)
Family with pt.  Iv in place.  Pt sleeping intermittently.  Oxygen in place.  Nsr on monitor

## 2022-06-25 NOTE — Assessment & Plan Note (Addendum)
Continue lisinopril

## 2022-06-25 NOTE — ED Triage Notes (Addendum)
Pt comes with c/o covid+ and increased sob. Pt states he was dx with covid today with at home test. Wife is also positive. Pt has hx of copd and wears 2L at home. Pt's 02 was 88% on the 2L while at [facility] and HR-117.  [place] also states pt has shaking episode with them and dropped to lower 80s.  Pt has some labored breathing noted and accessory muscle use.

## 2022-06-25 NOTE — Assessment & Plan Note (Addendum)
Continue rosuvastatin. 

## 2022-06-25 NOTE — H&P (Addendum)
History and Physical    Patient: Brian Kramer WUX:[id number] DOB: 1946-02-05 DOA: 06/25/2022 DOS: the patient was seen and examined on 06/25/2022 PCP: Marisue Ivan, MD  Patient coming from: Home  Chief Complaint:  Chief Complaint  Patient presents with   Shortness of Breath   COVID+    HPI: Brian Kramer is a 77 y.o. male with medical history significant for CAD, s/p PCI/DES proximal Circumflex 2021, Dementia, CKD 3A, anemia and thrombocytopenia, RLS.CAD, COPD on nighttime oxygen, pulmonary hypertension on Ambrisentan, PAD, who tested positive for COVID at home and was sent to ED while at the [facility] clinic with tachycardia of 117 and hypoxia to the low 80s on room air.  He initially presented with labored breathing and fever and chills.  His wife is also positive for COVID.  History is limited due to dementia. ED course and data review: BP 139/63, Tmax up to 102.6, pulse up to 100 with respirations 24 and O2 sat 89% on 2 L.  Labs with WBC 7400 and lactic acid 0.8 and procalcitonin pending.  COVID-positive, flu and RSV negative.  Hemoglobin 11.7 (baseline 10.5-11.5 on Care Everywhere), platelets 1 24,000.  Creatinine at baseline at 1.39.  Urinalysis pending. EKG, independently viewed and interpreted showing sinus at 90 with nonspecific ST-T wave changes.  Chest x-ray showing the following: IMPRESSION: 1. Diffuse interstitial prominence consistent with viral pneumonitis given history of COVID positivity. 2. No acute airspace disease.  Patient treated with DuoNeb and methylprednisolone and given 1 L NS bolus.  Hospitalist consulted for admission.   Review of Systems: As mentioned in the history of present illness. All other systems reviewed and are negative.  Past Medical History:  Diagnosis Date   Anemia    Anginal pain (HCC)    Arthritis    CKD (chronic kidney disease), stage III (HCC)    Cognitive impairment    COPD (chronic obstructive pulmonary disease) (HCC)     Coronary artery disease    a.) MI 03/1995 - no PCI; b.) LHC/PCI 1997 - stent (unknown type) x 1 to RCA; c.) LHC/PCI 2000 - stent (unknown type) x 1 to LCx; d.) LHC 04/02/2010 - insig CAD with widely patent stents; medical mgmt; e.) LHC 04/30/2015 - patent stents; insig CAD; medical mgmt; f.) R/LHC 05/01/20: EF 60%, norm cors.   Depression    Diastolic dysfunction 09/05/2021   a.)  TTE 09/05/2021: EF 50%, mild BAE, trivial PR, mild MR/TR; pHTN (RVSP 50 mmHg), G1DD.   Essential hypertension 04/08/2021   GERD (gastroesophageal reflux disease)    a.) improved s/p sleeve gastrectomy; no daily Tx   History of hiatal hernia 2004   a.) s/p repair   History of obstructive sleep apnea    a.) resolved s/p sleeve gastrectomy; no longer requires nocturnal PAP therapy   HLD (hyperlipidemia)    Insomnia    Migraines    Myocardial infarction (HCC) 03/1995   a.) full details unknown; LHC performed, however no PCI performed.   PAD (peripheral artery disease) (HCC) 04/08/2021   Peripheral neuropathy    Presbycusis    PUD (peptic ulcer disease)    Pulmonary hypertension (HCC)    a.) takes ambrisentan; b.)  R/LHC 05/01/2020: EF 60%, mean PA 30, mean PCWP 16, RVEDP 19, Ao sat 95, Pa sat 74, CO 8.2, PVR 137, SVR 736; c.)  TTE 09/05/2021: EF 50%, RVSP 50 mmHg.   RLS (restless legs syndrome)    a.) takes ropinirole   Thrombocytopenia (HCC)  Tremor    Type 2 diabetes, diet controlled [facility])    Past Surgical History:  Procedure Laterality Date   AMPUTATION TOE Right 12/20/2021   Procedure: AMPUTATION TOE - SECOND AND THIRD - 28825;  Surgeon: Sharlotte Alamo, DPM;  Location: [facility] ORS;  Service: Podiatry;  Laterality: Right;   CARPOMETACARPAL ([place]) FUSION OF THUMB Right 04/08/2018   Procedure: CARPOMETACARPAL [facility]) FUSION OF THUMB;  Surgeon: Hessie Knows, MD;  Location: [facility];  Service: Orthopedics;  Laterality: Right;   CARPOMETACARPAL ([place]) FUSION OF THUMB Left 12/02/2018   Procedure: CARPOMETACARPAL  ([place]) FUSION OF  LEFT THUMB, DIABETIC;  Surgeon: Hessie Knows, MD;  Location: [facility];  Service: Orthopedics;  Laterality: Left;   CATARACT EXTRACTION Bilateral 2015   COLONOSCOPY     COLONOSCOPY WITH PROPOFOL N/A 05/19/2018   Procedure: COLONOSCOPY WITH PROPOFOL;  Surgeon: Tol[place], Benay Pike, MD;  Location: [facility];  Service: Gastroenterology;  Laterality: N/A;   CORONARY ANGIOPLASTY WITH STENT PLACEMENT Left 1997   Procedure: CORONARY ANGIOPLASTY WITH STENT PLACEMENT; stent x1 (unknown type) to RCA   CORONARY ANGIOPLASTY WITH STENT PLACEMENT Left 2000   Procedure: CORONARY ANGIOPLASTY WITH STENT PLACEMENT; stent x1 (unknown type) to LCx   EXCISION PARTIAL PHALANX Left 10/04/2021   Procedure: EXCISION PARTIAL PHALANX - 2, 3, 4;  Surgeon: Sharlotte Alamo, DPM;  Location: [facility];  Service: Podiatry;  Laterality: Left;   FLEXOR TENOTOMY  Right 12/20/2021   Procedure: FLEXOR TENOTOMY - FOURTH TOE - 52841;  Surgeon: Sharlotte Alamo, DPM;  Location: [facility];  Service: Podiatry;  Laterality: Right;   HAMMER TOE SURGERY Right 01/15/2018   Procedure: HAMMER TOE CORRECTION-2ND & 3RD;  Surgeon: Albertine Patricia, DPM;  Location: [facility];  Service: Podiatry;  Laterality: Right;   HEEL SPUR RESECTION  2012   INGUINAL HERNIA REPAIR Bilateral    KNEE ARTHROSCOPY Right 2005   KNEE ARTHROSCOPY Left 2006   LAPAROSCOPIC GASTRIC SLEEVE RESECTION N/A 03/17/2016   LEFT HEART CATH AND CORONARY ANGIOGRAPHY Left 04/02/2010   Procedure: LEFT HEART CATH AND CORONARY ANGIOGRAPHY; Location: [facility]; Surgeon: Truitt Merle, MD   LEFT HEART CATH AND CORONARY ANGIOGRAPHY Left 04/30/2015   Procedure: LEFT HEART CATH AND CORONARY ANGIOGRAPHY; Location: [facility]; Surgeon: Phineas Inches, MD   RIGHT AND LEFT HEART CATH Left 05/01/2020   Procedure: RIGHT AND LEFT HEART CATH;  Surgeon: Yolonda Kida, MD;  Location: [place] [facility];  Service: Cardiovascular;  Laterality: Left;   Right shoulder AC separation surgery in 1995  Right    Stem Cells to left heel  2017   TONSILLECTOMY     TOTAL KNEE ARTHROPLASTY Left 2007   TOTAL KNEE ARTHROPLASTY Right 2015   ULNAR TUNNEL RELEASE Left 04/14/2019   Procedure: LEFT CUBITAL TUNNEL RELEASE;  Surgeon: Hessie Knows, MD;  Location: [facility];  Service: Orthopedics;  Laterality: Left;   UPPER GI ENDOSCOPY  2017   VASECTOMY     1970s   WEIL OSTEOTOMY Right 01/15/2018   Procedure: WEIL OSTEOTOMY-2ND & 3RD;  Surgeon: Albertine Patricia, DPM;  Location: [facility];  Service: Podiatry;  Laterality: Right;   Social History:  reports that he quit smoking about 28 years ago. His smoking use included cigarettes. He has never used smokeless tobacco. He reports current alcohol use of about 1.0 - 2.0 standard drink of alcohol per week. He reports that he does not use drugs.  Allergies  Allergen Reactions   Tape Other (See Comments)    Round, white EKG pads only - "  ate holes in my skin" 1997 when stays on skin for longer period Any tape OK if it doesn't stay on too long   Penicillins Itching, Rash and Other (See Comments)    Has patient had a PCN reaction causing immediate rash, facial/tongue/throat swelling, SOB or lightheadedness with hypotension: Yes Has patient had a PCN reaction causing severe rash involving mucus membranes or skin necrosis: No Has patient had a PCN reaction that required hospitalization: No Has patient had a PCN reaction occurring within the last 10 years: No If all of the above answers are "NO", then may proceed with Cephalosporin use.    Shellfish Allergy Rash and Other (See Comments)    PT CAN NOT EAT CRAB or lobster, BUT CAN EAT shrimp. No problems with betadine or iodine    No family history on file.  Prior to Admission medications   Medication Sig Start Date End Date Taking? Authorizing Provider  albuterol (VENTOLIN HFA) 108 (90 Base) MCG/ACT inhaler Inhale 2 puffs into the lungs every 6 (six) hours as needed for wheezing or shortness of breath.   Yes  [provider]  ambrisentan (LETAIRIS) 5 MG tablet Take 5 mg by mouth daily. 02/21/21  Yes [provider]  aspirin EC 81 MG tablet Take 81 mg by mouth at bedtime.    Yes [provider]  azelastine (ASTELIN) 0.1 % nasal spray Place 1 spray into both nostrils 2 (two) times daily as needed for rhinitis or allergies. Use in each nostril as directed   Yes [provider]  Calcium 500-100 MG-UNIT CHEW Chew 500 mg by mouth 2 (two) times daily. Calcium is bariatric chewable with vitamin D included   Yes [provider]  DULoxetine HCl 30 MG CSDR Take 90 mg by mouth daily. 04/19/20  Yes [provider]  ferrous sulfate 325 (65 FE) MG tablet Take 325 mg by mouth daily with breakfast.   Yes [provider]  Fluticasone-Umeclidin-Vilant (TRELEGY ELLIPTA) 100-62.5-25 MCG/ACT AEPB Inhale 1 puff into the lungs daily. 01/24/21  Yes [provider]  ipratropium (ATROVENT) 0.03 % nasal spray Place 2 sprays into both nostrils 3 (three) times daily as needed for rhinitis.    Yes [provider]  lisinopril (PRINIVIL,ZESTRIL) 5 MG tablet Take 5 mg by mouth daily.    Yes [provider]  LYRICA 200 MG capsule Take 200 mg by mouth 3 (three) times daily.    Yes [provider]  mometasone (ELOCON) 0.1 % lotion 1-4 drops See admin instructions. 1-4 drops in each ear as needed for itching 08/19/21  Yes [provider]  Multiple Vitamins-Minerals (BARIATRIC MULTIVITAMINS/IRON PO) Take 1 tablet by mouth daily.   Yes [provider]  nitroGLYCERIN (NITROSTAT) 0.4 MG SL tablet Place 0.4 mg under the tongue every 5 (five) minutes as needed for chest pain.  10/21/12  Yes [provider]  QUEtiapine (SEROQUEL) 100 MG tablet Take 100 mg by mouth at bedtime.    Yes [provider]  rOPINIRole (REQUIP XL) 4 MG 24 hr tablet Take 4 mg by mouth at bedtime.   Yes [provider]  rosuvastatin  (CRESTOR) 5 MG tablet Take 5 mg by mouth daily.  08/22/17  Yes [provider]  HYDROcodone-acetaminophen (NORCO/VICODIN) 5-325 MG tablet Take 1 tablet by mouth every 4 (four) hours as needed for moderate pain. Patient not taking: Reported on 06/25/2022 12/20/21 12/20/22  Sharlotte Alamo, DPM    Physical Exam: Vitals:   06/25/22 1834 06/25/22  1900 06/25/22 1930 06/25/22 1941  BP: (!) 144/55 (!) 123/51 132/70   Pulse: 92 96 100   Resp: 15 (!) 24 20   Temp:    (!) 101.5 F (38.6 C)  TempSrc:    Oral  SpO2: 92% 95% 92%    Physical Exam Vitals and nursing note reviewed.  Constitutional:      General: He is sleeping. He is not in acute distress.    Interventions: Nasal cannula in place.     Comments: Elderly frail-appearing male, restless  HENT:     Head: Normocephalic and atraumatic.  Cardiovascular:     Rate and Rhythm: Normal rate and regular rhythm.     Heart sounds: Normal heart sounds.  Pulmonary:     Effort: Tachypnea present.     Breath sounds: Wheezing present.  Abdominal:     Palpations: Abdomen is soft.     Tenderness: There is no abdominal tenderness.  Neurological:     Mental Status: He is easily aroused. Mental status is at baseline.     Labs on Admission: I have personally reviewed following labs and imaging studies  CBC: Recent Labs  Lab 06/25/22 1809  WBC 7.4  HGB 11.7*  HCT 36.0*  MCV 94.7  PLT 124*   Basic Metabolic Panel: Recent Labs  Lab 06/25/22 1809  NA 136  K 4.7  CL 105  CO2 22  GLUCOSE 142*  BUN 25*  CREATININE 1.39*  CALCIUM 9.2   GFR: CrCl cannot be calculated (Unknown ideal weight.). Liver Function Tests: No results for input(s): "AST", "ALT", "ALKPHOS", "BILITOT", "PROT", "ALBUMIN" in the last 168 hours. No results for input(s): "LIPASE", "AMYLASE" in the last 168 hours. No results for input(s): "AMMONIA" in the last 168 hours. Coagulation Profile: Recent Labs  Lab 06/25/22 1809  INR 1.1   Cardiac Enzymes: No  results for input(s): "CKTOTAL", "CKMB", "CKMBINDEX", "TROPONINI" in the last 168 hours. BNP (last 3 results) No results for input(s): "PROBNP" in the last 8760 hours. HbA1C: No results for input(s): "HGBA1C" in the last 72 hours. CBG: No results for input(s): "GLUCAP" in the last 168 hours. Lipid Profile: No results for input(s): "CHOL", "HDL", "LDLCALC", "TRIG", "CHOLHDL", "LDLDIRECT" in the last 72 hours. Thyroid Function Tests: No results for input(s): "TSH", "T4TOTAL", "FREET4", "T3FREE", "THYROIDAB" in the last 72 hours. Anemia Panel: No results for input(s): "VITAMINB12", "FOLATE", "FERRITIN", "TIBC", "IRON", "RETICCTPCT" in the last 72 hours. Urine analysis: No results found for: "COLORURINE", "APPEARANCEUR", "LABSPEC", "PHURINE", "GLUCOSEU", "HGBUR", "BILIRUBINUR", "KETONESUR", "PROTEINUR", "UROBILINOGEN", "NITRITE", "LEUKOCYTESUR"  Radiological Exams on Admission: DG Chest 1 View  Result Date: 06/25/2022 CLINICAL DATA:  Short of breath, COVID positive EXAM: CHEST  1 VIEW COMPARISON:  None Available. FINDINGS: Single frontal view of the chest demonstrates an unremarkable cardiac silhouette. No airspace disease, effusion, or pneumothorax. Mild diffuse interstitial prominence could reflect sequela of viral pneumonitis. Elevated right hemidiaphragm. No acute bony abnormalities. IMPRESSION: 1. Diffuse interstitial prominence consistent with viral pneumonitis given history of COVID positivity. 2. No acute airspace disease. Electronically Signed   By: Sharlet Salina M.D.   On: 06/25/2022 18:35     Data Reviewed: Relevant notes from primary care and specialist visits, past discharge summaries as available in EHR, including Care Everywhere. Prior diagnostic testing as pertinent to current admission diagnoses Updated medications and problem lists for reconciliation ED course, including vitals, labs, imaging, treatment and response to treatment Triage notes, nursing and pharmacy notes and  ED provider's notes Notable results as noted in  HPI   Assessment and Plan: * Pneumonia due to COVID-19 virus Acute on chronic respiratory failure secondary to COVID Possible sepsis COPD exacerbation Patient desaturating to the low 80s while on O2 at his open flow rate with increased work of breathing Possible sepsis.  Patient has fever, tachycardia and tachypnea with respiratory failure Remdesivir, methylprednisolone Scheduled and as needed albuterol nebulizer Antitussives, incentive spirometry and supportive care Daily biomarkers  Frailty Discussed with wife: Patient has been having balance issues PT eval  Dementia without behavioral disturbance (HCC) Restless leg syndrome Depression Continue quetiapine, ropinirole Delirium precautions  Mild pulmonary hypertension (HCC) Continue Ambrisentan  Hyperlipidemia Continue rosuvastatin  CAD (coronary artery disease) Continue lisinopril, aspirin, rosuvastatin and nitroglycerin  Essential hypertension Continue lisinopril  PAD (peripheral artery disease) (HCC) Continue aspirin and rosuvastatin    DVT prophylaxis: Lovenox  Consults: none  Advance Care Planning:   Code Status: DNR   Family Communication: Spoke with wife, Sabastian Raimondi over the phone.  Discussed plan of care and she voiced understanding and agreement.  Also discussed with daughter at bedside  Disposition Plan: Back to previous home environment  Severity of Illness: The appropriate patient status for this patient is OBSERVATION. Observation status is judged to be reasonable and necessary in order to provide the required intensity of service to ensure the patient's safety. The patient's presenting symptoms, physical exam findings, and initial radiographic and laboratory data in the context of their medical condition is felt to place them at decreased risk for further clinical deterioration. Furthermore, it is anticipated that the patient will be medically  stable for discharge from the hospital within 2 midnights of admission.   Author: Andris Baumann, MD 06/25/2022 8:51 PM  For on call review [email or web address].

## 2022-06-25 NOTE — Assessment & Plan Note (Addendum)
Discussed with wife: Patient has been having balance disease PT and OT eval-recommends home health

## 2022-06-25 NOTE — Assessment & Plan Note (Addendum)
Continue Ambrisentan.  Followed by Dr. Fleming. 

## 2022-06-25 NOTE — Assessment & Plan Note (Deleted)
Continue duloxetine

## 2022-06-25 NOTE — Assessment & Plan Note (Addendum)
Acute on chronic respiratory failure secondary to COVID Sepsis ruled out COPD exacerbation Patient initially was desaturating to the low 80s while on O2 at his open flow rate with increased work of breathing but now saturating over 95% on 2 L oxygen Ruled out sepsis.   I have stopped remdesivir, methylprednisolone as patient is on his baseline 2 L oxygen Continue scheduled and as needed albuterol nebulizer, Antitussives, incentive spirometry and supportive care

## 2022-06-25 NOTE — IPAL (Signed)
  Interdisciplinary Goals of Care Family Meeting   Date carried out: 06/25/2022  Location of the meeting: Phone conference  Member's involved: Physician and Family Member or next of kin, wife, Brian Kramer  Durable Power of Attorney or acting medical decision maker: Brian Kramer, wife  Discussion: We discussed goals of care for [employer] .   I have reviewed medical records including EPIC notes, labs and imaging, assessed the patient and then met with patient to discuss major active diagnoses, plan of care, natural trajectory, prognosis, GOC, EOL wishes, disposition and options including Full code/DNI/DNR and the concept of comfort care if DNR is elected. Questions and concerns were addressed. They are  in agreement to continue current plan of care . Election for DNR status.  Wife states the DNR papers at home.   Code status:   Code Status: DNR   Disposition: Home  Time spent for the meeting: [place], MD  06/25/2022, 9:31 PM

## 2022-06-26 ENCOUNTER — Encounter: Payer: Self-pay | Admitting: Internal Medicine

## 2022-06-26 DIAGNOSIS — U071 COVID-19: Principal | ICD-10-CM

## 2022-06-26 DIAGNOSIS — F0390 Unspecified dementia without behavioral disturbance: Secondary | ICD-10-CM | POA: Diagnosis not present

## 2022-06-26 DIAGNOSIS — R54 Age-related physical debility: Secondary | ICD-10-CM | POA: Diagnosis not present

## 2022-06-26 DIAGNOSIS — N179 Acute kidney failure, unspecified: Secondary | ICD-10-CM | POA: Diagnosis present

## 2022-06-26 DIAGNOSIS — J1282 Pneumonia due to coronavirus disease 2019: Secondary | ICD-10-CM

## 2022-06-26 DIAGNOSIS — J9621 Acute and chronic respiratory failure with hypoxia: Secondary | ICD-10-CM

## 2022-06-26 LAB — COMPREHENSIVE METABOLIC PANEL
ALT: 11 U/L (ref 0–44)
AST: 32 U/L (ref 15–41)
Albumin: 3.6 g/dL (ref 3.5–5.0)
Alkaline Phosphatase: 65 U/L (ref 38–126)
Anion gap: 8 (ref 5–15)
BUN: 29 mg/dL — ABNORMAL HIGH (ref 8–23)
CO2: 21 mmol/L — ABNORMAL LOW (ref 22–32)
Calcium: 8.6 mg/dL — ABNORMAL LOW (ref 8.9–10.3)
Chloride: 106 mmol/L (ref 98–111)
Creatinine, Ser: 1.51 mg/dL — ABNORMAL HIGH (ref 0.61–1.24)
GFR, Estimated: 47 mL/min — ABNORMAL LOW (ref >60)
Glucose, Bld: 177 mg/dL — ABNORMAL HIGH (ref 70–99)
Potassium: 5.3 mmol/L — ABNORMAL HIGH (ref 3.5–5.1)
Sodium: 135 mmol/L (ref 135–145)
Total Bilirubin: 0.7 mg/dL (ref 0.3–1.2)
Total Protein: 6.9 g/dL (ref 6.5–8.1)

## 2022-06-26 LAB — CBC WITH DIFFERENTIAL/PLATELET
Abs Immature Granulocytes: 0.02 10*3/uL (ref 0.00–0.07)
Basophils Absolute: 0 10*3/uL (ref 0.0–0.1)
Basophils Relative: 0 %
Eosinophils Absolute: 0 10*3/uL (ref 0.0–0.5)
Eosinophils Relative: 0 %
HCT: 34.5 % — ABNORMAL LOW (ref 39.0–52.0)
Hemoglobin: 11 g/dL — ABNORMAL LOW (ref 13.0–17.0)
Immature Granulocytes: 0 %
Lymphocytes Relative: 7 %
Lymphs Abs: 0.4 10*3/uL — ABNORMAL LOW (ref 0.7–4.0)
MCH: 31.5 pg (ref 26.0–34.0)
MCHC: 31.9 g/dL (ref 30.0–36.0)
MCV: 98.9 fL (ref 80.0–100.0)
Monocytes Absolute: 0.1 10*3/uL (ref 0.1–1.0)
Monocytes Relative: 1 %
Neutro Abs: 4.7 10*3/uL (ref 1.7–7.7)
Neutrophils Relative %: 92 %
Platelets: 115 10*3/uL — ABNORMAL LOW (ref 150–400)
RBC: 3.49 MIL/uL — ABNORMAL LOW (ref 4.22–5.81)
RDW: 13.8 % (ref 11.5–15.5)
WBC: 5.2 10*3/uL (ref 4.0–10.5)
nRBC: 0 % (ref 0.0–0.2)

## 2022-06-26 LAB — BASIC METABOLIC PANEL
Anion gap: 7 (ref 5–15)
BUN: 35 mg/dL — ABNORMAL HIGH (ref 8–23)
CO2: 22 mmol/L (ref 22–32)
Calcium: 8.8 mg/dL — ABNORMAL LOW (ref 8.9–10.3)
Chloride: 106 mmol/L (ref 98–111)
Creatinine, Ser: 1.68 mg/dL — ABNORMAL HIGH (ref 0.61–1.24)
GFR, Estimated: 42 mL/min — ABNORMAL LOW (ref >60)
Glucose, Bld: 153 mg/dL — ABNORMAL HIGH (ref 70–99)
Potassium: 4.7 mmol/L (ref 3.5–5.1)
Sodium: 135 mmol/L (ref 135–145)

## 2022-06-26 LAB — LACTIC ACID, PLASMA: Lactic Acid, Venous: 1.6 mmol/L (ref 0.5–1.9)

## 2022-06-26 LAB — C-REACTIVE PROTEIN: CRP: 4.1 mg/dL — ABNORMAL HIGH (ref <1.0)

## 2022-06-26 LAB — MAGNESIUM: Magnesium: 1.9 mg/dL (ref 1.7–2.4)

## 2022-06-26 LAB — PHOSPHORUS: Phosphorus: 4.7 mg/dL — ABNORMAL HIGH (ref 2.5–4.6)

## 2022-06-26 MED ORDER — DULOXETINE HCL 30 MG PO CPEP
90.0000 mg | ORAL_CAPSULE | Freq: Every day | ORAL | Status: DC
  Administered 2022-06-26 – 2022-06-28 (×3): 90 mg via ORAL
  Filled 2022-06-26 (×3): qty 3

## 2022-06-26 MED ORDER — QUETIAPINE FUMARATE 25 MG PO TABS
100.0000 mg | ORAL_TABLET | Freq: Every day | ORAL | Status: DC
  Administered 2022-06-26 – 2022-06-27 (×2): 100 mg via ORAL
  Filled 2022-06-26 (×2): qty 4

## 2022-06-26 MED ORDER — SODIUM CHLORIDE 0.9 % IV SOLN: INTRAVENOUS | Status: AC

## 2022-06-26 MED ORDER — ASPIRIN 81 MG PO TBEC
81.0000 mg | DELAYED_RELEASE_TABLET | Freq: Every day | ORAL | Status: DC
  Administered 2022-06-26 – 2022-06-28 (×3): 81 mg via ORAL
  Filled 2022-06-26 (×3): qty 1

## 2022-06-26 MED ORDER — ROPINIROLE HCL ER 4 MG PO TB24
4.0000 mg | ORAL_TABLET | Freq: Every day | ORAL | Status: DC
  Administered 2022-06-27: 4 mg via ORAL
  Filled 2022-06-26 (×2): qty 1

## 2022-06-26 NOTE — Care Management Important Message (Signed)
Important Message  Patient Details  Name: Brian Kramer MRN: [id number] Date of Birth: 1946-04-15   Medicare Important Message Given:  N/A - LOS <3 / Initial given by admissions     Dannette Barbara 06/26/2022, 1:16 PM

## 2022-06-26 NOTE — Assessment & Plan Note (Signed)
Likely prerenal as patient has very poor p.o. intake.  Start IV hydration and monitor kidney function.  Avoid nephrotoxic medications Lab Results  Component Value Date   CREATININE 1.51 (H) 06/26/2022   CREATININE 1.39 (H) 06/25/2022   CREATININE 1.20 05/01/2020

## 2022-06-26 NOTE — Progress Notes (Signed)
Pharmacy Consult for Pulmonary Hypertension Treatment   Indication - Continuation of prior to admission medication   Patient is 77 y.o.  with history of PAH on chronic Ambrisentan (Letairis) PTA and will be continued while hospitalized.   Continuing this medication order as an inpatient requires that monitoring parameters per REMS requirements must be met.  Chronic therapy is under the supervision of Dr. Wallene Huh (REMS ID [id number]) who is enrolled in the REMS program and is being notified of continuation of therapy. A staff message in EPIC has been sent notifying the certified prescriber.  Per patient report has previously been educated on Hepatotoxicity . On admission pregnancy risk has been assessed and no monitoring required. Hepatic function has been evaluated. AST/ALT appropriate to continue medication at this time.     Latest Ref Rng & Units 06/26/2022    4:25 AM 06/25/2022    6:09 PM 05/01/2020    9:49 AM  Hepatic Function  Total Protein 6.5 - 8.1 g/dL 6.9  7.2  6.8   Albumin 3.5 - 5.0 g/dL 3.6  3.9  3.8   AST 15 - 41 U/L 32  24  20   ALT 0 - 44 U/L 11  15  15    Alk Phosphatase 38 - 126 U/L 65  70  79   Total Bilirubin 0.3 - 1.2 mg/dL 0.7  0.6  0.7   Bilirubin, Direct 0.0 - 0.2 mg/dL  <0.1      If any question arise or pregnancy is identified during hospitalization, contact for bosentan: [phone]; macitentan: [phone]; ambrisentan: [phone].  Thank for you allowing [place] to participate in the care of this patient.   Benita Gutter 06/26/22

## 2022-06-26 NOTE — Assessment & Plan Note (Signed)
He is not wheezing much at this point.  Will continue inhalers and nebulizer but stop steroids as this could make his agitation/behavior worse

## 2022-06-26 NOTE — Assessment & Plan Note (Signed)
Now on 2 L.  Likely will be able to wean him off to room air soon

## 2022-06-26 NOTE — Evaluation (Signed)
Physical Therapy Evaluation Patient Details Name: Brian Kramer MRN: [id number] DOB: [birth year] Today's Date: 06/26/2022  History of Present Illness  Brian Kramer is a 40yoM who comes to [facility] on 06/25/22 c progressive SOB, recent (+) COVID home test. In ED pt Is noted to be febrile, have increased O2 need, and increased WOB. PMH: COPD on 2L at home, CAD, s/p PCI/DES proximal Circumflex 2021, Dementia, CKD 3A, anemia and thrombocytopenia, RLS.CAD.  Clinical Impression  Pt in ED room, EOB, DTR at bedside, handoff from OT who gives report. Pt agreeable to PT session. He's on 2L/min VSS. Pt able to perform all mobility without physical assist. He uses RW today versus SPC baseline device. Sounds like SPC is not meeting needs all the time, discussion regarding use of walker more often at home. Pt able to AMB in hallway with O2 donned 3L, no desat, mild dysnea with activity. Pt reports no frank changes noted to strength and balance with illness, neither here nor at home. Will continue to follow.      Recommendations for follow up therapy are one component of a multi-disciplinary discharge planning process, led by the attending physician.  Recommendations may be updated based on patient status, additional functional criteria and insurance authorization.  Follow Up Recommendations Outpatient PT (Continue with [facility] OPPT as prior to admission.)      Assistance Recommended at Discharge Set up Supervision/Assistance  Patient can return home with the following  Help with stairs or ramp for entrance;Assist for transportation    Equipment Recommendations None recommended by PT  Recommendations for Other Services       Functional Status Assessment Patient has had a recent decline in their functional status and demonstrates the ability to make significant improvements in function in a reasonable and predictable amount of time.     Precautions / Restrictions Precautions Precautions:  Fall Restrictions Weight Bearing Restrictions: No      Mobility  Bed Mobility                    Transfers Overall transfer level: Needs assistance Equipment used: Rolling walker (2 wheels) Transfers: Sit to/from Stand Sit to Stand: Min guard           General transfer comment: effort for balance, effort to rise    Ambulation/Gait Ambulation/Gait assistance: Min guard, Supervision Gait Distance (Feet): 180 Feet Assistive device: Rolling walker (2 wheels) Gait Pattern/deviations: Step-through pattern       General Gait Details: safe steady use of RW compared to baseline use of SPC with what sounds like heavy Rt trendelenburg per DTR description; Pt on 3L for AMB, mild dyspnea without desaturation.  Stairs            Wheelchair Mobility    Modified Rankin (Stroke Patients Only)       Balance                                             Pertinent Vitals/Pain Pain Assessment Pain Assessment: No/denies pain    Home Living Family/patient expects to be discharged to:: Private residence Living Arrangements: Spouse/significant other Available Help at Discharge: Family;Available 24 hours/day Type of Home: House Home Access: Level entry       Home Layout: One level Home Equipment: Cane - single point;Crutches;Shower seat;Rollator (4 wheels);Standard Walker      Prior Function Prior Level  of Function : Independent/Modified Independent;History of Falls (last six months)             Mobility Comments: Pt reports 15 falls in the last 6 months ADLs Comments: Per pt reports, he utilizes [facility] for mobility and reports Ind in self care and shares IADLs with wife at home.     Hand Dominance   Dominant Hand: Right    Extremity/Trunk Assessment   Upper Extremity Assessment Upper Extremity Assessment: Overall WFL for tasks assessed    Lower Extremity Assessment Lower Extremity Assessment: Overall WFL for tasks assessed        Communication   Communication: No difficulties  Cognition Arousal/Alertness: Awake/alert Behavior During Therapy: WFL for tasks assessed/performed Overall Cognitive Status: Within Functional Limits for tasks assessed                                          General Comments      Exercises     Assessment/Plan    PT Assessment Patient needs continued PT services  PT Problem List Cardiopulmonary status limiting activity;Decreased mobility;Decreased activity tolerance       PT Treatment Interventions DME instruction;Gait training;Stair training;Functional mobility training;Therapeutic activities;Therapeutic exercise;Balance training    PT Goals (Current goals can be found in the Care Plan section)  Acute Rehab PT Goals Patient Stated Goal: safe return to home, return to OPPT PT Goal Formulation: With patient/family Time For Goal Achievement: 07/10/22 Potential to Achieve Goals: Good    Frequency Min 2X/week     Co-evaluation               AM-PAC PT "6 Clicks" Mobility  Outcome Measure Help needed turning from your back to your side while in a flat bed without using bedrails?: None Help needed moving from lying on your back to sitting on the side of a flat bed without using bedrails?: None Help needed moving to and from a bed to a chair (including a wheelchair)?: A Little Help needed standing up from a chair using your arms (e.g., wheelchair or bedside chair)?: A Little Help needed to walk in hospital room?: A Little Help needed climbing 3-5 steps with a railing? : A Little 6 Click Score: 20    End of Session   Activity Tolerance: Patient tolerated treatment well;No increased pain;Patient limited by fatigue Patient left: in bed;with call bell/phone within reach;with family/visitor present Nurse Communication: Mobility status PT Visit Diagnosis: Unsteadiness on feet (R26.81);Other abnormalities of gait and mobility (R26.89)    Time:  0912-0949 PT Time Calculation (min) (ACUTE ONLY): 37 min   Charges:   PT Evaluation $PT Eval Low Complexity: 1 Low PT Treatments $Therapeutic Activity: 8-22 mins       11:41 AM, 06/26/22 Etta Grandchild, PT, DPT Physical Therapist - [facility]  [phone] ([place])    Breianna Delfino C 06/26/2022, 11:38 AM

## 2022-06-26 NOTE — Hospital Course (Addendum)
77 y.o. male with medical history significant for CAD, s/p PCI/DES proximal Circumflex 2021, Dementia, CKD 3A, anemia and thrombocytopenia, RLS.CAD, COPD on nighttime oxygen, pulmonary hypertension on Ambrisentan, PAD, who tested positive for COVID at home and was sent to ED while at the [facility] clinic with tachycardia of 117 and hypoxia to the low 80s on room air.   1/18: Feeling better, stopped remdesivir and steroids as patient is at his baseline oxygen (2 L) 1/19: BP low, hypoxic in 85 with minimal ambulation

## 2022-06-26 NOTE — Progress Notes (Signed)
  Progress Note   Patient: Brian Kramer ONG:[id number] DOB: 01/01/1946 DOA: 06/25/2022     1 DOS: the patient was seen and examined on 06/26/2022   Brief hospital course: 77 y.o. male with medical history significant for CAD, s/p PCI/DES proximal Circumflex 2021, Dementia, CKD 3A, anemia and thrombocytopenia, RLS.CAD, COPD on nighttime oxygen, pulmonary hypertension on Ambrisentan, PAD, who tested positive for COVID at home and was sent to ED while at the [facility] clinic with tachycardia of 117 and hypoxia to the low 80s on room air.   1/18: Feeling better, stopped remdesivir and steroids as patient is at his baseline oxygen (2 L)  Assessment and Plan: * Pneumonia due to COVID-19 virus Acute on chronic respiratory failure secondary to COVID Sepsis ruled out COPD exacerbation Patient initially was desaturating to the low 80s while on O2 at his open flow rate with increased work of breathing but now saturating over 95% on 2 L oxygen Ruled out sepsis.   I have stopped remdesivir, methylprednisolone as patient is on his baseline 2 L oxygen Continue scheduled and as needed albuterol nebulizer, Antitussives, incentive spirometry and supportive care  Acute on chronic respiratory failure with hypoxia ([place]) Now on 2 L.  Likely will be able to wean him off to room air soon  COPD with acute exacerbation [facility]) He is not wheezing much at this point.  Will continue inhalers and nebulizer but stop steroids as this could make his agitation/behavior worse  AKI (acute kidney injury) ([place]) Likely prerenal as patient has very poor p.o. intake.  Start IV hydration and monitor kidney function.  Avoid nephrotoxic medications Lab Results  Component Value Date   CREATININE 1.51 (H) 06/26/2022   CREATININE 1.39 (H) 06/25/2022   CREATININE 1.20 05/01/2020     Frailty Discussed with wife: Patient has been having balance disease PT and OT eval-recommends home health  Dementia without behavioral  disturbance (HCC) Restless leg syndrome Depression Continue quetiapine, ropinirole Delirium precautions Palliative care consult  Mild pulmonary hypertension ([place]) Continue Ambrisentan.  Followed by Dr. Raul Del  Hyperlipidemia Continue rosuvastatin.  CAD (coronary artery disease) Continue lisinopril, aspirin, rosuvastatin and nitroglycerin.  Essential hypertension Continue lisinopril.  PAD (peripheral artery disease) (HCC) Continue aspirin and rosuvastatin.        Subjective: Feeling much better.  Some cough.  Daughter at bedside  Physical Exam: Vitals:   06/26/22 0800 06/26/22 0830 06/26/22 0831 06/26/22 0900  BP: (!) 109/52 (!) 114/52  (!) 121/47  Pulse: 60 60  80  Resp: 17 17  18   Temp: 98 F (36.7 C)     TempSrc:      SpO2: 96% 99%  98%  Weight:   120.2 kg   Height:   5\' 9"  (1.16 m)    77 year old male lying in the bed comfortably without any acute distress Lungs decreased breath sounds at the bases Cardiovascular regular rate and rhythm Abdomen soft, benign Neuro alert and awake, nonfocal Skin no rash or lesion Psych normal mood and affect Data Reviewed:  Potassium 5.3, creatinine 1.51  Family Communication: Updated daughter at bedside  Disposition: Status is: Inpatient Remains inpatient appropriate because: Management of AKI, COVID-pneumonia  Planned Discharge Destination: Home with Home Health   DVT prophylaxis-Lovenox Time spent: 35 minutes  Author: Max Sane, MD 06/26/2022 1:43 PM  For on call review [email or web address].

## 2022-06-26 NOTE — Evaluation (Signed)
Occupational Therapy Evaluation Patient Details Name: Brian Kramer MRN: [id number] DOB: [birth year] Today's Date: 06/26/2022   History of Present Illness 77 y.o. male with medical history significant for CAD, s/p PCI/DES proximal Circumflex 2021, Dementia, CKD 3A, anemia and thrombocytopenia, RLS.CAD, COPD on nighttime oxygen, pulmonary hypertension on Ambrisentan, PAD, who tested positive for COVID at home and was sent to ED while at the [facility] clinic with tachycardia of 117 and hypoxia to the low 80s on room air.  He initially presented with labored breathing and fever and chills.  His wife is also positive for COVID.  History is limited due to dementia.   Clinical Impression   Patient presenting with decreased Ind in self care, balance, functional mobility/transfers, endurance, and safety awareness. Patient reports being mod I at baseline with use of SPC for mobility and living with wife. He endorses shared responsibilities for IADLs and Ind in self care. Pt reports having 15 falls in the last 6 months at home. Daughter present to confirm baseline and home set up. Pt is on 2Ls O2 via [place] at night only at baseline and currently on 2Ls during assessment with O2 saturation remaining at or above 95% during session. Pt dons B shoes with set up A and pt ambulates ~ 40 feet while pushing IV pole in room.  OT recommends pt utilize rollator at home to conserve energy  and to decrease fall risk. Patient will benefit from acute OT to increase overall independence in the areas of ADLs, functional mobility, and safety awareness in order to safely discharge home with family.      Recommendations for follow up therapy are one component of a multi-disciplinary discharge planning process, led by the attending physician.  Recommendations may be updated based on patient status, additional functional criteria and insurance authorization.   Follow Up Recommendations  Home health OT     Assistance Recommended  at Discharge Intermittent Supervision/Assistance  Patient can return home with the following A little help with walking and/or transfers;A little help with bathing/dressing/bathroom;Help with stairs or ramp for entrance;Assist for transportation;Assistance with cooking/housework;Direct supervision/assist for financial management;Direct supervision/assist for medications management    Functional Status Assessment  Patient has had a recent decline in their functional status and demonstrates the ability to make significant improvements in function in a reasonable and predictable amount of time.  Equipment Recommendations  Other (comment) (Has all needed equipment.)       Precautions / Restrictions Precautions Precautions: Fall      Mobility Bed Mobility Overal bed mobility: Modified Independent             General bed mobility comments: HOB elevated and increased time to perform but no physical assistance    Transfers Overall transfer level: Needs assistance Equipment used: 1 person hand held assist Transfers: Sit to/from Stand, Bed to chair/wheelchair/BSC Sit to Stand: Min guard     Step pivot transfers: Min guard            Balance Overall balance assessment: Needs assistance, History of Falls Sitting-balance support: Feet supported Sitting balance-Leahy Scale: Good     Standing balance support: During functional activity, Single extremity supported Standing balance-Leahy Scale: Fair                             ADL either performed or assessed with clinical judgement   ADL Overall ADL's : Needs assistance/impaired  Lower Body Dressing: Set up;Supervision/safety;Sitting/lateral leans Lower Body Dressing Details (indicate cue type and reason): to don B shoes Toilet Transfer: Min Psychiatric nurse Details (indicate cue type and reason): simulated         Functional mobility during ADLs: Supervision/safety;Min guard        Vision Patient Visual Report: No change from baseline              Pertinent Vitals/Pain Pain Assessment Pain Assessment: No/denies pain     Hand Dominance Right   Extremity/Trunk Assessment Upper Extremity Assessment Upper Extremity Assessment: Overall WFL for tasks assessed   Lower Extremity Assessment Lower Extremity Assessment: Overall WFL for tasks assessed       Communication Communication Communication: No difficulties   Cognition Arousal/Alertness: Awake/alert Behavior During Therapy: WFL for tasks assessed/performed Overall Cognitive Status: Within Functional Limits for tasks assessed                                                  Home Living Family/patient expects to be discharged to:: Private residence Living Arrangements: Spouse/significant other Available Help at Discharge: Family;Available 24 hours/day Type of Home: House Home Access: Level entry     Home Layout: One level     Bathroom Shower/Tub: Occupational psychologist: Standard     Home Equipment: Cane - single point;Crutches;Shower seat;Rollator (4 wheels);Standard Walker          Prior Functioning/Environment Prior Level of Function : Independent/Modified Independent;History of Falls (last six months)             Mobility Comments: Pt reports 15 falls in the last 6 months ADLs Comments: Per pt reports, he utilizes [facility] for mobility and reports Ind in self care and shares IADLs with wife at home.        OT Problem List: Decreased strength;Decreased activity tolerance;Impaired balance (sitting and/or standing);Decreased knowledge of use of DME or AE;Decreased safety awareness;Cardiopulmonary status limiting activity      OT Treatment/Interventions: Self-care/ADL training;Therapeutic exercise;Therapeutic activities;DME and/or AE instruction;Manual therapy;Balance training;Patient/family education;Energy conservation    OT Goals(Current goals  can be found in the care plan section) Acute Rehab OT Goals Patient Stated Goal: to return home OT Goal Formulation: With patient/family Time For Goal Achievement: 07/10/22 Potential to Achieve Goals: Fair  OT Frequency: Min 2X/week       AM-PAC OT "6 Clicks" Daily Activity     Outcome Measure Help from another person eating meals?: None Help from another person taking care of personal grooming?: None Help from another person toileting, which includes using toliet, bedpan, or urinal?: A Little Help from another person bathing (including washing, rinsing, drying)?: A Little Help from another person to put on and taking off regular upper body clothing?: None Help from another person to put on and taking off regular lower body clothing?: A Little 6 Click Score: 21   End of Session Nurse Communication: Mobility status  Activity Tolerance: Patient tolerated treatment well Patient left: in bed;with call bell/phone within reach;with family/visitor present  OT Visit Diagnosis: Unsteadiness on feet (R26.81);Repeated falls (R29.6);Muscle weakness (generalized) (M62.81)                Time: 5643-3295 OT Time Calculation (min): 21 min Charges:  OT General Charges $OT Visit: 1 Visit OT Evaluation $OT Eval Moderate Complexity: 1 Mod OT Treatments $  Therapeutic Activity: 8-22 mins  Darleen Crocker, MS, OTR/L , CBIS ascom [phone]  06/26/22, 10:52 AM

## 2022-06-26 NOTE — ED Notes (Signed)
Informed RN bed assigned 

## 2022-06-26 NOTE — ED Notes (Signed)
Wife given update via phone.

## 2022-06-26 NOTE — Assessment & Plan Note (Addendum)
Discussed with wife: Patient has been having balance issues PT and OT eval-recommends home health Will add midodrine as his blood pressure has been running low chronically

## 2022-06-27 DIAGNOSIS — U071 COVID-19: Secondary | ICD-10-CM | POA: Diagnosis not present

## 2022-06-27 DIAGNOSIS — J441 Chronic obstructive pulmonary disease with (acute) exacerbation: Secondary | ICD-10-CM | POA: Diagnosis not present

## 2022-06-27 DIAGNOSIS — N179 Acute kidney failure, unspecified: Secondary | ICD-10-CM | POA: Diagnosis not present

## 2022-06-27 DIAGNOSIS — J9621 Acute and chronic respiratory failure with hypoxia: Secondary | ICD-10-CM | POA: Diagnosis not present

## 2022-06-27 LAB — CBC WITH DIFFERENTIAL/PLATELET
Abs Immature Granulocytes: 0.02 10*3/uL (ref 0.00–0.07)
Basophils Absolute: 0 10*3/uL (ref 0.0–0.1)
Basophils Relative: 0 %
Eosinophils Absolute: 0 10*3/uL (ref 0.0–0.5)
Eosinophils Relative: 1 %
HCT: 34.4 % — ABNORMAL LOW (ref 39.0–52.0)
Hemoglobin: 11 g/dL — ABNORMAL LOW (ref 13.0–17.0)
Immature Granulocytes: 0 %
Lymphocytes Relative: 8 %
Lymphs Abs: 0.4 10*3/uL — ABNORMAL LOW (ref 0.7–4.0)
MCH: 30.6 pg (ref 26.0–34.0)
MCHC: 32 g/dL (ref 30.0–36.0)
MCV: 95.6 fL (ref 80.0–100.0)
Monocytes Absolute: 0.8 10*3/uL (ref 0.1–1.0)
Monocytes Relative: 14 %
Neutro Abs: 4.4 10*3/uL (ref 1.7–7.7)
Neutrophils Relative %: 77 %
Platelets: 107 10*3/uL — ABNORMAL LOW (ref 150–400)
RBC: 3.6 MIL/uL — ABNORMAL LOW (ref 4.22–5.81)
RDW: 13.7 % (ref 11.5–15.5)
WBC: 5.7 10*3/uL (ref 4.0–10.5)
nRBC: 0 % (ref 0.0–0.2)

## 2022-06-27 LAB — MAGNESIUM: Magnesium: 1.8 mg/dL (ref 1.7–2.4)

## 2022-06-27 LAB — COMPREHENSIVE METABOLIC PANEL
ALT: 17 U/L (ref 0–44)
AST: 35 U/L (ref 15–41)
Albumin: 3.6 g/dL (ref 3.5–5.0)
Alkaline Phosphatase: 60 U/L (ref 38–126)
Anion gap: 3 — ABNORMAL LOW (ref 5–15)
BUN: 31 mg/dL — ABNORMAL HIGH (ref 8–23)
CO2: 25 mmol/L (ref 22–32)
Calcium: 8.2 mg/dL — ABNORMAL LOW (ref 8.9–10.3)
Chloride: 109 mmol/L (ref 98–111)
Creatinine, Ser: 1.54 mg/dL — ABNORMAL HIGH (ref 0.61–1.24)
GFR, Estimated: 46 mL/min — ABNORMAL LOW (ref >60)
Glucose, Bld: 105 mg/dL — ABNORMAL HIGH (ref 70–99)
Potassium: 4.4 mmol/L (ref 3.5–5.1)
Sodium: 137 mmol/L (ref 135–145)
Total Bilirubin: 0.5 mg/dL (ref 0.3–1.2)
Total Protein: 6.6 g/dL (ref 6.5–8.1)

## 2022-06-27 LAB — PHOSPHORUS: Phosphorus: 3 mg/dL (ref 2.5–4.6)

## 2022-06-27 LAB — C-REACTIVE PROTEIN: CRP: 3.7 mg/dL — ABNORMAL HIGH (ref <1.0)

## 2022-06-27 MED ORDER — MIDODRINE HCL 5 MG PO TABS
5.0000 mg | ORAL_TABLET | Freq: Three times a day (TID) | ORAL | Status: DC
  Administered 2022-06-27 – 2022-06-28 (×3): 5 mg via ORAL
  Filled 2022-06-27 (×3): qty 1

## 2022-06-27 MED ORDER — SODIUM CHLORIDE 0.9 % IV SOLN: INTRAVENOUS | Status: AC

## 2022-06-27 NOTE — Assessment & Plan Note (Signed)
He is not wheezing much at this point. continue inhalers and nebulizer but stop steroids as this could make his agitation/behavior worse

## 2022-06-27 NOTE — Assessment & Plan Note (Signed)
Continue aspirin, rosuvastatin and nitroglycerin.  Holding lisinopril as blood pressure has been running low

## 2022-06-27 NOTE — TOC Initial Note (Addendum)
Transition of Care [facility]) - Initial/Assessment Note    Patient Details  Name: Brian Kramer MRN: [id number] Date of Birth: 12/06/1945  [facility] CM/SW Contact:    Magnus Ivan, LCSW Phone Number: 06/27/2022, 2:15 PM  Clinical Narrative:      CSW spoke to patient's spouse regarding PT and OT recs.  [place] lives home with his wife who provides transportation. PCP is Dr. Netty Starring. Pharmacy is [facility] in [place]. Patient has crutches, a cane, a shower chair, a rollator, and a walker at home.  Patient's wife reported he recently went to [place] [facility] [place] but has since finished OPPT. She stated they prefer Home Health if possible. No agency preference and confirmed home address on file. HH referral accepted by Tommi Rumps with [facility] who is aware of DC home tomorrow. Asked MD for orders.            Also sent secure encrypted email to [place] with [facility] and [place] with [facility] to inquire if VA would assist with [facility] benefits as well per family request.   Expected Discharge Plan: [place] Barriers to Discharge: Continued Medical Work up   Patient Goals and CMS Choice Patient states their goals for this hospitalization and ongoing recovery are:: home with home health CMS Medicare.gov Compare Post Acute Care list provided to:: Patient Represenative (must comment) Choice offered to / list presented to : Spouse      Expected Discharge Plan and Services       Living arrangements for the past 2 months: Single Family Home                           HH Arranged: PT, OT HH Agency: [place] Date [facility] Agency Contacted: 06/27/22   Representative spoke with at [place]: Tommi Rumps  Prior Living Arrangements/Services Living arrangements for the past 2 months: [place] Lives with:: Spouse Patient language and need for interpreter reviewed:: Yes Do you feel safe going back to the place where you live?: Yes      Need for Family Participation  in Patient Care: Yes (Comment) Care giver support system in place?: Yes (comment) Current home services: DME Criminal Activity/Legal Involvement Pertinent to Current Situation/Hospitalization: No - Comment as needed  Activities of Daily Living Home Assistive Devices/Equipment: None ADL Screening (condition at time of admission) Patient's cognitive ability adequate to safely complete daily activities?: Yes Is the patient deaf or have difficulty hearing?: No Does the patient have difficulty seeing, even when wearing glasses/contacts?: No Does the patient have difficulty concentrating, remembering, or making decisions?: No Patient able to express need for assistance with ADLs?: Yes Does the patient have difficulty dressing or bathing?: No Independently performs ADLs?: Yes (appropriate for developmental age) Does the patient have difficulty walking or climbing stairs?: No Weakness of Legs: None Weakness of Arms/Hands: None  Permission Sought/Granted Permission sought to share information with : Chartered certified accountant granted to share information with : Yes, Verbal Permission Granted     Permission granted to share info w AGENCY: Home Health        Emotional Assessment         Alcohol / Substance Use: Not Applicable Psych Involvement: No (comment)  Admission diagnosis:  SOB (shortness of breath) [R06.02] Acute respiratory failure due to COVID-19 ([place]) [U07.1, J96.00] COVID-19 [U07.1] Patient Active Problem List   Diagnosis Date Noted   AKI (acute kidney injury) ([place])  06/26/2022   COPD with acute exacerbation ([place]) 06/25/2022   Acute on chronic respiratory failure with hypoxia ([place]) 06/25/2022   Pneumonia due to COVID-19 virus 06/25/2022   Dementia without behavioral disturbance ([place]) 06/25/2022   Frailty 06/25/2022   COPD, moderate ([place]) 06/23/2022   Mild pulmonary hypertension (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 [place] [address] Phone: [phone] Fax: [phone]     Social Determinants of Health (SDOH) Social History: SDOH Screenings   Food Insecurity: No Food Insecurity (06/26/2022)  Housing: Low Risk  (06/26/2022)  Transportation Needs: No Transportation Needs (06/26/2022)  Utilities: Not At Risk (06/26/2022)  Tobacco Use: Medium Risk (06/26/2022)   SDOH Interventions:     Readmission Risk Interventions     No data to display

## 2022-06-27 NOTE — Plan of Care (Signed)

## 2022-06-27 NOTE — Assessment & Plan Note (Signed)
Likely prerenal as patient has very poor p.o. intake.  Continue IV hydration and monitor kidney function.  Avoid nephrotoxic medications Lab Results  Component Value Date   CREATININE 1.54 (H) 06/27/2022   CREATININE 1.68 (H) 06/26/2022   CREATININE 1.51 (H) 06/26/2022

## 2022-06-27 NOTE — Progress Notes (Signed)
Progress Note   Patient: Irma Delancey EAV:[id number] DOB: August 11, 1945 DOA: 06/25/2022     2 DOS: the patient was seen and examined on 06/27/2022   Brief hospital course: 77 y.o. male with medical history significant for CAD, s/p PCI/DES proximal Circumflex 2021, Dementia, CKD 3A, anemia and thrombocytopenia, RLS.CAD, COPD on nighttime oxygen, pulmonary hypertension on Ambrisentan, PAD, who tested positive for COVID at home and was sent to ED while at the [facility] clinic with tachycardia of 117 and hypoxia to the low 80s on room air.   1/18: Feeling better, stopped remdesivir and steroids as patient is at his baseline oxygen (2 L) 1/19: BP low, hypoxic in 85 with minimal ambulation  Assessment and Plan: * Pneumonia due to COVID-19 virus Acute on chronic respiratory failure secondary to COVID Sepsis ruled out COPD exacerbation Patient initially was desaturating to the low 80s while on O2 at his open flow rate with increased work of breathing but now saturating over 95% on 2 L oxygen Ruled out sepsis.   I have stopped remdesivir, methylprednisolone as patient is on his baseline 2 L oxygen mainly at night, he may need 2 liters with ambulation now. Continue scheduled and as needed albuterol nebulizer, Antitussives, incentive spirometry and supportive care  Acute on chronic respiratory failure with hypoxia (HCC) Currently on 2 L.  Likely will be able to wean him off to room air soon.  He uses 2 L oxygen at night at home at baseline.  Will check his oxygen saturations at rest.  He desaturated to mid 80s on minimal ambulation while going to bathroom and return.  COPD with acute exacerbation ([place]) He is not wheezing much at this point. continue inhalers and nebulizer but stop steroids as this could make his agitation/behavior worse  AKI (acute kidney injury) ([place]) Likely prerenal as patient has very poor p.o. intake.  Continue IV hydration and monitor kidney function.  Avoid nephrotoxic  medications Lab Results  Component Value Date   CREATININE 1.54 (H) 06/27/2022   CREATININE 1.68 (H) 06/26/2022   CREATININE 1.51 (H) 06/26/2022     Frailty Discussed with wife: Patient has been having balance issues PT and OT eval-recommends home health Will add midodrine as his blood pressure has been running low chronically  Dementia without behavioral disturbance (HCC) Restless leg syndrome Depression Continue quetiapine, ropinirole Delirium precautions Palliative care consult for goals of care  Mild pulmonary hypertension ([place]) Continue Ambrisentan.  Followed by Dr. Raul Del.  Hyperlipidemia Continue rosuvastatin  CAD (coronary artery disease) Continue aspirin, rosuvastatin and nitroglycerin.  Holding lisinopril as blood pressure has been running low  Essential hypertension lisinopril stopped.  Added midodrine as blood pressure has been running low  PAD (peripheral artery disease) (HCC) Continue aspirin and rosuvastatin        Subjective: Wants to go home.  He understood the rationale in keeping him 1 more day.  He got hypoxic in mid 80s on minimal exertion just by going to the bathroom.  Son and daughter-in-law at bedside  Physical Exam: Vitals:   06/27/22 0626 06/27/22 0853 06/27/22 0945 06/27/22 1025  BP: (!) 114/42 (!) 90/38  (!) 94/36  Pulse: 76 69    Resp: 15 18  16   Temp: 100.1 F (37.8 C) 98.5 F (36.9 C)    TempSrc: Oral Oral    SpO2: 97% 94% (!) 88%   Weight:      Height:       77 year old male lying in the bed comfortably without any acute  distress Lungs decreased breath sounds at the bases Cardiovascular regular rate and rhythm Abdomen soft, benign Neuro alert and awake, nonfocal Skin no rash or lesion Psych normal mood and affect Data Reviewed:  Creatinine 1.54  Family Communication: Son and daughter-in-law updated at bedside  Disposition: Status is: Inpatient Remains inpatient appropriate because: Needing IV hydration for  improvement of renal function and low blood pressure.  Also requiring oxygen for now  Planned Discharge Destination: Home with Home Health   DVT prophylaxis-Lovenox Time spent: 35 minutes  Author: Max Sane, MD 06/27/2022 12:33 PM  For on call review [email or web address].

## 2022-06-27 NOTE — Plan of Care (Signed)
Patient AOX4, VSS throughout shift.  Pt woke up confused not knowing where he was.  Pt was reoriented.  All meds given on time as ordered.  Diminished lungs, IS encouraged.  Pt voided in urinal.  POC maintained, will continue to monitor.  Problem: Education: Goal: Knowledge of risk factors and measures for prevention of condition will improve Outcome: Progressing   Problem: Coping: Goal: Psychosocial and spiritual needs will be supported Outcome: Progressing   Problem: Respiratory: Goal: Will maintain a patent airway Outcome: Progressing Goal: Complications related to the disease process, condition or treatment will be avoided or minimized Outcome: Progressing   Problem: Education: Goal: Knowledge of General Education information will improve Description: Including pain rating scale, medication(s)/side effects and non-pharmacologic comfort measures Outcome: Progressing   Problem: Health Behavior/Discharge Planning: Goal: Ability to manage health-related needs will improve Outcome: Progressing   Problem: Clinical Measurements: Goal: Ability to maintain clinical measurements within normal limits will improve Outcome: Progressing Goal: Will remain free from infection Outcome: Progressing Goal: Diagnostic test results will improve Outcome: Progressing Goal: Respiratory complications will improve Outcome: Progressing Goal: Cardiovascular complication will be avoided Outcome: Progressing   Problem: Activity: Goal: Risk for activity intolerance will decrease Outcome: Progressing   Problem: Nutrition: Goal: Adequate nutrition will be maintained Outcome: Progressing   Problem: Coping: Goal: Level of anxiety will decrease Outcome: Progressing   Problem: Elimination: Goal: Will not experience complications related to bowel motility Outcome: Progressing Goal: Will not experience complications related to urinary retention Outcome: Progressing   Problem: Pain  Managment: Goal: General experience of comfort will improve Outcome: Progressing   Problem: Safety: Goal: Ability to remain free from injury will improve Outcome: Progressing   Problem: Skin Integrity: Goal: Risk for impaired skin integrity will decrease Outcome: Progressing

## 2022-06-27 NOTE — Assessment & Plan Note (Signed)
lisinopril stopped.  Added midodrine as blood pressure has been running low

## 2022-06-27 NOTE — Assessment & Plan Note (Signed)
Currently on 2 L.  Likely will be able to wean him off to room air soon.  He uses 2 L oxygen at night at home at baseline.  Will check his oxygen saturations at rest.  He desaturated to mid 80s on minimal ambulation while going to bathroom and return.

## 2022-06-27 NOTE — Assessment & Plan Note (Signed)
Continue rosuvastatin. 

## 2022-06-27 NOTE — Assessment & Plan Note (Signed)
Acute on chronic respiratory failure secondary to COVID Sepsis ruled out COPD exacerbation Patient initially was desaturating to the low 80s while on O2 at his open flow rate with increased work of breathing but now saturating over 95% on 2 L oxygen Ruled out sepsis.   I have stopped remdesivir, methylprednisolone as patient is on his baseline 2 L oxygen mainly at night, he may need 2 liters with ambulation now. Continue scheduled and as needed albuterol nebulizer, Antitussives, incentive spirometry and supportive care

## 2022-06-27 NOTE — Assessment & Plan Note (Signed)
Continue Ambrisentan.  Followed by Dr. Raul Del.

## 2022-06-27 NOTE — Assessment & Plan Note (Signed)
Restless leg syndrome Depression Continue quetiapine, ropinirole Delirium precautions Palliative care consult for goals of care

## 2022-06-27 NOTE — Assessment & Plan Note (Signed)
Continue aspirin and rosuvastatin ?

## 2022-06-28 ENCOUNTER — Encounter: Payer: Self-pay | Admitting: Internal Medicine

## 2022-06-28 DIAGNOSIS — J1282 Pneumonia due to coronavirus disease 2019: Secondary | ICD-10-CM | POA: Diagnosis not present

## 2022-06-28 DIAGNOSIS — R0602 Shortness of breath: Secondary | ICD-10-CM

## 2022-06-28 DIAGNOSIS — U071 COVID-19: Secondary | ICD-10-CM | POA: Diagnosis not present

## 2022-06-28 DIAGNOSIS — J9621 Acute and chronic respiratory failure with hypoxia: Secondary | ICD-10-CM | POA: Diagnosis not present

## 2022-06-28 HISTORY — DX: Shortness of breath: R06.02

## 2022-06-28 LAB — CBC WITH DIFFERENTIAL/PLATELET
Abs Immature Granulocytes: 0.03 10*3/uL (ref 0.00–0.07)
Basophils Absolute: 0 10*3/uL (ref 0.0–0.1)
Basophils Relative: 1 %
Eosinophils Absolute: 0 10*3/uL (ref 0.0–0.5)
Eosinophils Relative: 1 %
HCT: 31.4 % — ABNORMAL LOW (ref 39.0–52.0)
Hemoglobin: 10.3 g/dL — ABNORMAL LOW (ref 13.0–17.0)
Immature Granulocytes: 1 %
Lymphocytes Relative: 15 %
Lymphs Abs: 0.6 10*3/uL — ABNORMAL LOW (ref 0.7–4.0)
MCH: 31.1 pg (ref 26.0–34.0)
MCHC: 32.8 g/dL (ref 30.0–36.0)
MCV: 94.9 fL (ref 80.0–100.0)
Monocytes Absolute: 0.5 10*3/uL (ref 0.1–1.0)
Monocytes Relative: 13 %
Neutro Abs: 2.7 10*3/uL (ref 1.7–7.7)
Neutrophils Relative %: 69 %
Platelets: 114 10*3/uL — ABNORMAL LOW (ref 150–400)
RBC: 3.31 MIL/uL — ABNORMAL LOW (ref 4.22–5.81)
RDW: 14 % (ref 11.5–15.5)
WBC: 3.9 10*3/uL — ABNORMAL LOW (ref 4.0–10.5)
nRBC: 0 % (ref 0.0–0.2)

## 2022-06-28 LAB — C-REACTIVE PROTEIN: CRP: 4.6 mg/dL — ABNORMAL HIGH (ref <1.0)

## 2022-06-28 LAB — COMPREHENSIVE METABOLIC PANEL
ALT: 17 U/L (ref 0–44)
AST: 34 U/L (ref 15–41)
Albumin: 3.2 g/dL — ABNORMAL LOW (ref 3.5–5.0)
Alkaline Phosphatase: 51 U/L (ref 38–126)
Anion gap: 7 (ref 5–15)
BUN: 28 mg/dL — ABNORMAL HIGH (ref 8–23)
CO2: 21 mmol/L — ABNORMAL LOW (ref 22–32)
Calcium: 7.9 mg/dL — ABNORMAL LOW (ref 8.9–10.3)
Chloride: 109 mmol/L (ref 98–111)
Creatinine, Ser: 1.2 mg/dL (ref 0.61–1.24)
GFR, Estimated: >60 mL/min (ref >60)
Glucose, Bld: 95 mg/dL (ref 70–99)
Potassium: 4 mmol/L (ref 3.5–5.1)
Sodium: 137 mmol/L (ref 135–145)
Total Bilirubin: 0.6 mg/dL (ref 0.3–1.2)
Total Protein: 6.1 g/dL — ABNORMAL LOW (ref 6.5–8.1)

## 2022-06-28 LAB — MAGNESIUM: Magnesium: 1.8 mg/dL (ref 1.7–2.4)

## 2022-06-28 LAB — PHOSPHORUS: Phosphorus: 3.2 mg/dL (ref 2.5–4.6)

## 2022-06-28 MED ORDER — ZINC SULFATE 220 (50 ZN) MG PO CAPS: 220.0000 mg | ORAL_CAPSULE | Freq: Every day | ORAL | 0 refills | Status: AC

## 2022-06-28 MED ORDER — ASCORBIC ACID 500 MG PO TABS: 500.0000 mg | ORAL_TABLET | Freq: Every day | ORAL | 0 refills | Status: AC

## 2022-06-28 MED ORDER — MIDODRINE HCL 5 MG PO TABS: 5.0000 mg | ORAL_TABLET | Freq: Three times a day (TID) | ORAL | 0 refills | Status: AC

## 2022-06-28 NOTE — TOC Transition Note (Signed)
Transition of Care [facility]) - CM/SW Discharge Note   Patient Details  Name: Brian Kramer MRN: [id number] Date of Birth: [birth year]  Transition of Care [facility]) CM/SW Contact:  Magnus Ivan, LCSW Phone Number: 06/28/2022, 10:45 AM   Clinical Narrative:    Notified Cory with [facility] of patient being DC home today. Per [place] with [facility] patient does not have benefits.   Final next level of care: Home w Home Health Services Barriers to Discharge: Barriers Resolved   Patient Goals and CMS Choice CMS Medicare.gov Compare Post Acute Care list provided to:: Patient Represenative (must comment) Choice offered to / list presented to : Spouse  Discharge Placement                         Discharge Plan and Services Additional resources added to the After Visit Summary for                            [facility] Arranged: PT, OT [facility] Agency: [place] Date [place]: 06/28/22   Representative spoke with at [place]: [place] Determinants of Health ([place]) Interventions SDOH Screenings   Food Insecurity: No Food Insecurity (06/26/2022)  Housing: Low Risk  (06/26/2022)  Transportation Needs: No Transportation Needs (06/26/2022)  Utilities: Not At Risk (06/26/2022)  Tobacco Use: Medium Risk (06/28/2022)     Readmission Risk Interventions     No data to display

## 2022-06-29 NOTE — Discharge Summary (Signed)
Physician Discharge Summary   Patient: Brian Kramer MRN: [id number] DOB: 07-10-1945  Admit date:     06/25/2022  Discharge date: 06/28/2022  Discharge Physician: Max Sane   PCP: Dion Body, MD   Recommendations at discharge:   Follow-up with outpatient providers as requested  Discharge Diagnoses: Principal Problem:   Pneumonia due to COVID-19 virus Active Problems:   Acute on chronic respiratory failure with hypoxia (HCC)   COPD with acute exacerbation (HCC)   PAD (peripheral artery disease) ([place])   Essential hypertension   CAD (coronary artery disease)   Hyperlipidemia   Mild pulmonary hypertension (HCC)   Dementia without behavioral disturbance (HCC)   Frailty   AKI (acute kidney injury) ([place])   SOB (shortness of breath)  Hospital Course: 77 y.o. male with medical history significant for CAD, s/p PCI/DES proximal Circumflex 2021, Dementia, CKD 3A, anemia and thrombocytopenia, RLS.CAD, COPD on nighttime oxygen, pulmonary hypertension on Ambrisentan, PAD, who tested positive for COVID at home and was sent to ED while at the [facility] clinic with tachycardia of 117 and hypoxia to the low 80s on room air.   1/18: Feeling better, stopped remdesivir and steroids as patient is at his baseline oxygen (2 L) 1/19: BP low, hypoxic in 85 with minimal ambulation  Assessment and Plan: * Pneumonia due to COVID-19 virus Acute on chronic respiratory failure secondary to COVID Sepsis ruled out COPD exacerbation Patient initially was desaturating to the low 80s while on O2 at his open flow rate with increased work of breathing but now saturating over 95% on 2 L oxygen Ruled out sepsis.   Oxygenation improved quickly.  He remains at baseline.  Did not require remdesivir and steroids while in the hospital although he was given initially in the emergency department  Acute on chronic respiratory failure with hypoxia ([place]) Uses 2 L at night at baseline.  He may benefit from 2 L  on ambulation.  COPD with acute exacerbation (HCC) Improved with inhalers and nebulizer  AKI (acute kidney injury) ([place]) Likely prerenal as patient has very poor p.o. intake.  Resolved at discharge  Lab Results  Component Value Date   CREATININE 1.20 06/28/2022   CREATININE 1.54 (H) 06/27/2022   CREATININE 1.68 (H) 06/26/2022     Frailty Discussed with wife: Patient has been having balance issues PT and OT eval-recommends home health Started midodrine as his blood pressure has been running low chronically and stopped lisinopril  Dementia without behavioral disturbance (HCC) Restless leg syndrome Depression Continue quetiapine, ropinirole  Mild pulmonary hypertension (HCC) Continue Ambrisentan.  Followed by Dr. Raul Del.  Hyperlipidemia Continue rosuvastatin  CAD (coronary artery disease) Continue aspirin, rosuvastatin and nitroglycerin.  Holding lisinopril as blood pressure has been running low  Hypotension with a history of essential hypertension lisinopril stopped.  Added midodrine as blood pressure has been running low  PAD (peripheral artery disease) (HCC) Continue aspirin and rosuvastatin         Disposition: Home health Diet recommendation:  Discharge Diet Orders (From admission, onward)     Start     Ordered   06/28/22 0000  Diet - low sodium heart healthy        06/28/22 1042           Carb modified diet DISCHARGE MEDICATION: Allergies as of 06/28/2022       Reactions   Tape Other (See Comments)   Round, white EKG pads only - "ate holes in my skin" 1997 when stays on skin for longer  period Any tape OK if it doesn't stay on too long   Penicillins Itching, Rash, Other (See Comments)   Has patient had a PCN reaction causing immediate rash, facial/tongue/throat swelling, SOB or lightheadedness with hypotension: Yes Has patient had a PCN reaction causing severe rash involving mucus membranes or skin necrosis: No Has patient had a PCN reaction  that required hospitalization: No Has patient had a PCN reaction occurring within the last 10 years: No If all of the above answers are "NO", then may proceed with Cephalosporin use.   Shellfish Allergy Rash, Other (See Comments)   PT CAN NOT EAT CRAB or lobster, BUT CAN EAT shrimp. No problems with betadine or iodine        Medication List     STOP taking these medications    HYDROcodone-acetaminophen 5-325 MG tablet Commonly known as: NORCO/VICODIN   lisinopril 5 MG tablet Commonly known as: ZESTRIL       TAKE these medications    albuterol 108 (90 Base) MCG/ACT inhaler Commonly known as: VENTOLIN HFA Inhale 2 puffs into the lungs every 6 (six) hours as needed for wheezing or shortness of breath.   ambrisentan 5 MG tablet Commonly known as: LETAIRIS Take 5 mg by mouth daily.   ascorbic acid 500 MG tablet Commonly known as: VITAMIN C Take 1 tablet (500 mg total) by mouth daily.   aspirin EC 81 MG tablet Take 81 mg by mouth at bedtime.   azelastine 0.1 % nasal spray Commonly known as: ASTELIN Place 1 spray into both nostrils 2 (two) times daily as needed for rhinitis or allergies. Use in each nostril as directed   BARIATRIC MULTIVITAMINS/IRON PO Take 1 tablet by mouth daily.   Calcium 500-100 MG-UNIT Chew Chew 500 mg by mouth 2 (two) times daily. Calcium is bariatric chewable with vitamin D included   DULoxetine HCl 30 MG Csdr Take 90 mg by mouth daily.   ferrous sulfate 325 (65 FE) MG tablet Take 325 mg by mouth daily with breakfast.   ipratropium 0.03 % nasal spray Commonly known as: ATROVENT Place 2 sprays into both nostrils 3 (three) times daily as needed for rhinitis.   Lyrica 200 MG capsule Generic drug: pregabalin Take 200 mg by mouth 3 (three) times daily.   midodrine 5 MG tablet Commonly known as: PROAMATINE Take 1 tablet (5 mg total) by mouth 3 (three) times daily with meals.   mometasone 0.1 % lotion Commonly known as: ELOCON 1-4 drops  See admin instructions. 1-4 drops in each ear as needed for itching   nitroGLYCERIN 0.4 MG SL tablet Commonly known as: NITROSTAT Place 0.4 mg under the tongue every 5 (five) minutes as needed for chest pain.   QUEtiapine 100 MG tablet Commonly known as: SEROQUEL Take 100 mg by mouth at bedtime.   rOPINIRole 4 MG 24 hr tablet Commonly known as: REQUIP XL Take 4 mg by mouth at bedtime.   rosuvastatin 5 MG tablet Commonly known as: CRESTOR Take 5 mg by mouth daily.   Trelegy Ellipta 100-62.5-25 MCG/ACT Aepb Generic drug: Fluticasone-Umeclidin-Vilant Inhale 1 puff into the lungs daily.   zinc sulfate 220 (50 Zn) MG capsule Take 1 capsule (220 mg total) by mouth daily.        Follow-up Information     Marisue Ivan, MD. Schedule an appointment as soon as possible for a visit in 1 week(s).   Specialty: Family Medicine Why: [facility] Discharge F/UP Contact information: [address] [facility]  [place] [address] [phone]                Discharge Exam: Danley Danker Weights   06/26/22 0831  Weight: 73.41 kg   77 year old male lying in the bed comfortably without any acute distress Lungs decreased breath sounds at the bases Cardiovascular regular rate and rhythm Abdomen soft, benign Neuro alert and awake, nonfocal Skin no rash or lesion Psych normal mood and affect  Condition at discharge: fair  The results of significant diagnostics from this hospitalization (including imaging, microbiology, ancillary and laboratory) are listed below for reference.   Imaging Studies: DG Chest 1 View  Result Date: 06/25/2022 CLINICAL DATA:  Short of breath, COVID positive EXAM: CHEST  1 VIEW COMPARISON:  None Available. FINDINGS: Single frontal view of the chest demonstrates an unremarkable cardiac silhouette. No airspace disease, effusion, or pneumothorax. Mild diffuse interstitial prominence could reflect sequela of viral pneumonitis. Elevated  right hemidiaphragm. No acute bony abnormalities. IMPRESSION: 1. Diffuse interstitial prominence consistent with viral pneumonitis given history of COVID positivity. 2. No acute airspace disease. Electronically Signed   By: Randa Ngo M.D.   On: 06/25/2022 18:35    Microbiology: Results for orders placed or performed during the hospital encounter of 06/25/22  Blood Culture (routine x 2)     Status: None (Preliminary result)   Collection Time: 06/25/22  6:08 PM   Specimen: BLOOD  Result Value Ref Range Status   Specimen Description BLOOD RIGHT ANTECUBITAL  Final   Special Requests   Final    BOTTLES DRAWN AEROBIC AND ANAEROBIC Blood Culture adequate volume   Culture   Final    NO GROWTH 4 DAYS Performed at [facility], [address] [place], [place] [address]    Report Status PENDING  Incomplete  Resp panel by RT-PCR (RSV, Flu A&B, Covid) Anterior Nasal Swab     Status: Abnormal   Collection Time: 06/25/22  6:08 PM   Specimen: Anterior Nasal Swab  Result Value Ref Range Status   SARS Coronavirus 2 by RT PCR POSITIVE (A) NEGATIVE Final    Comment: (NOTE) SARS-CoV-2 target nucleic acids are DETECTED.  The SARS-CoV-2 RNA is generally detectable in upper respiratory specimens during the acute phase of infection. Positive results are indicative of the presence of the identified virus, but do not rule out bacterial infection or co-infection with other pathogens not detected by the test. Clinical correlation with patient history and other diagnostic information is necessary to determine patient infection status. The expected result is Negative.  Fact Sheet for Patients: EntrepreneurPulse.com.au  Fact Sheet for Healthcare Providers: IncredibleEmployment.be  This test is not yet approved or cleared by the [place] FDA and  has been authorized for detection and/or diagnosis of SARS-CoV-2 by FDA under an Emergency Use Authorization  (EUA).  This EUA will remain in effect (meaning this test can be used) for the duration of  the COVID-19 declaration under Section 564(b)(1) of the A ct, 21 U.S.C. section 360bbb-3(b)(1), unless the authorization is terminated or revoked sooner.     Influenza A by PCR NEGATIVE NEGATIVE Final   Influenza B by PCR NEGATIVE NEGATIVE Final    Comment: (NOTE) The Xpert Xpress SARS-CoV-2/FLU/RSV plus assay is intended as an aid in the diagnosis of influenza from Nasopharyngeal swab specimens and should not be used as a sole basis for treatment. Nasal washings and aspirates are unacceptable for Xpert Xpress SARS-CoV-2/FLU/RSV testing.  Fact Sheet for Patients: EntrepreneurPulse.com.au  Fact Sheet for Healthcare Providers: IncredibleEmployment.be  This test is not yet approved or cleared by the [place] and has been authorized for detection and/or diagnosis of SARS-CoV-2 by FDA under an Emergency Use Authorization (EUA). This EUA will remain in effect (meaning this test can be used) for the duration of the COVID-19 declaration under Section 564(b)(1) of the Act, 21 U.S.C. section 360bbb-3(b)(1), unless the authorization is terminated or revoked.     Resp Syncytial Virus by PCR NEGATIVE NEGATIVE Final    Comment: (NOTE) Fact Sheet for Patients: EntrepreneurPulse.com.au  Fact Sheet for Healthcare Providers: IncredibleEmployment.be  This test is not yet approved or cleared by the [place] FDA and has been authorized for detection and/or diagnosis of SARS-CoV-2 by FDA under an Emergency Use Authorization (EUA). This EUA will remain in effect (meaning this test can be used) for the duration of the COVID-19 declaration under Section 564(b)(1) of the Act, 21 U.S.C. section 360bbb-3(b)(1), unless the authorization is terminated or revoked.  Performed at [facility], [address]  [place], [place] [address]   Blood Culture (routine x 2)     Status: None (Preliminary result)   Collection Time: 06/25/22  6:10 PM   Specimen: BLOOD  Result Value Ref Range Status   Specimen Description BLOOD BLOOD RIGHT FOREARM  Final   Special Requests   Final    BOTTLES DRAWN AEROBIC AND ANAEROBIC Blood Culture results may not be optimal due to an excessive volume of blood received in culture bottles   Culture   Final    NO GROWTH 4 DAYS Performed at [facility], [address] [place], [place] [address]    Report Status PENDING  Incomplete    Labs: CBC: Recent Labs  Lab 06/25/22 1809 06/26/22 0425 06/27/22 0424 06/28/22 0334  WBC 7.4 5.2 5.7 3.9*  NEUTROABS  --  4.7 4.4 2.7  HGB 11.7* 11.0* 11.0* 10.3*  HCT 36.0* 34.5* 34.4* 31.4*  MCV 94.7 98.9 95.6 94.9  PLT 124* 115* 107* [id number]*   Basic Metabolic Panel: Recent Labs  Lab 06/25/22 1809 06/26/22 0425 06/26/22 1354 06/27/22 0424 06/28/22 0334  NA 136 135 135 137 137  K 4.7 5.3* 4.7 4.4 4.0  CL 105 106 106 109 109  CO2 22 21* 22 25 21*  GLUCOSE 142* 177* 153* 105* 95  BUN 25* 29* 35* 31* 28*  CREATININE 1.39* 1.51* 1.68* 1.54* 1.20  CALCIUM 9.2 8.6* 8.8* 8.2* 7.9*  MG  --  1.9  --  1.8 1.8  PHOS  --  4.7*  --  3.0 3.2   Liver Function Tests: Recent Labs  Lab 06/25/22 1809 06/26/22 0425 06/27/22 0424 06/28/22 0334  AST 24 32 35 34  ALT 15 11 17 17   ALKPHOS 70 65 60 51  BILITOT 0.6 0.7 0.5 0.6  PROT 7.2 6.9 6.6 6.1*  ALBUMIN 3.9 3.6 3.6 3.2*   CBG: No results for input(s): "GLUCAP" in the last 168 hours.  Discharge time spent: greater than 30 minutes.  Signed: Max Sane, MD [facility] 06/29/2022

## 2022-06-30 LAB — CULTURE, BLOOD (ROUTINE X 2)
Culture: NO GROWTH
Culture: NO GROWTH
Special Requests: ADEQUATE

## 2022-10-29 ENCOUNTER — Ambulatory Visit: Payer: TRICARE For Life (TFL) | Admitting: Urology

## 2022-11-19 ENCOUNTER — Other Ambulatory Visit: Payer: Self-pay | Admitting: Nephrology

## 2022-11-19 DIAGNOSIS — N1832 Chronic kidney disease, stage 3b: Secondary | ICD-10-CM

## 2022-11-28 ENCOUNTER — Ambulatory Visit
Admission: RE | Admit: 2022-11-28 | Discharge: 2022-11-28 | Disposition: A | Discharge status: Home / Self Care | Payer: Medicare PPO | Source: Ambulatory Visit | Attending: Nephrology | Admitting: Nephrology

## 2022-11-28 DIAGNOSIS — N1832 Chronic kidney disease, stage 3b: Secondary | ICD-10-CM | POA: Insufficient documentation

## 2023-01-28 ENCOUNTER — Other Ambulatory Visit: Payer: Self-pay | Admitting: Orthopedic Surgery

## 2023-01-28 DIAGNOSIS — M25562 Pain in left knee: Secondary | ICD-10-CM

## 2023-02-04 ENCOUNTER — Encounter
Admission: RE | Admit: 2023-02-04 | Discharge: 2023-02-04 | Disposition: A | Discharge status: Home / Self Care | Payer: Medicare PPO | Source: Ambulatory Visit | Attending: Orthopedic Surgery | Admitting: Orthopedic Surgery

## 2023-02-04 DIAGNOSIS — M25562 Pain in left knee: Secondary | ICD-10-CM | POA: Insufficient documentation

## 2023-02-04 MED ORDER — TECHNETIUM TC 99M MEDRONATE IV KIT
20.0000 | PACK | Freq: Once | INTRAVENOUS | Status: AC | PRN
  Administered 2023-02-04: 22.81 via INTRAVENOUS

## 2023-02-24 ENCOUNTER — Other Ambulatory Visit
Admission: RE | Admit: 2023-02-24 | Discharge: 2023-02-24 | Disposition: A | Discharge status: Home / Self Care | Payer: Medicare PPO | Source: Ambulatory Visit | Attending: Orthopedic Surgery | Admitting: Orthopedic Surgery

## 2023-02-24 DIAGNOSIS — T8484XA Pain due to internal orthopedic prosthetic devices, implants and grafts, initial encounter: Secondary | ICD-10-CM | POA: Insufficient documentation

## 2023-02-24 DIAGNOSIS — T84033A Mechanical loosening of internal left knee prosthetic joint, initial encounter: Secondary | ICD-10-CM | POA: Diagnosis present

## 2023-02-24 DIAGNOSIS — M25562 Pain in left knee: Secondary | ICD-10-CM | POA: Diagnosis present

## 2023-02-24 DIAGNOSIS — Z96652 Presence of left artificial knee joint: Secondary | ICD-10-CM | POA: Diagnosis present

## 2023-02-24 LAB — SYNOVIAL CELL COUNT + DIFF, W/ CRYSTALS
Crystals, Fluid: NONE SEEN
Eosinophils-Synovial: 0 %
Lymphocytes-Synovial Fld: 71 %
Monocyte-Macrophage-Synovial Fluid: 27 %
Neutrophil, Synovial: 2 %
WBC, Synovial: 145 /mm3 (ref 0–200)

## 2023-03-09 ENCOUNTER — Other Ambulatory Visit: Payer: Self-pay | Admitting: Orthopedic Surgery

## 2023-03-23 ENCOUNTER — Encounter
Admission: RE | Admit: 2023-03-23 | Discharge: 2023-03-23 | Disposition: A | Discharge status: Home / Self Care | Payer: Medicare PPO | Source: Ambulatory Visit | Attending: Orthopedic Surgery | Admitting: Orthopedic Surgery

## 2023-03-23 ENCOUNTER — Other Ambulatory Visit: Payer: Self-pay

## 2023-03-23 VITALS — BP 138/56 | HR 51 | Resp 16 | Wt 263.9 lb

## 2023-03-23 DIAGNOSIS — E875 Hyperkalemia: Secondary | ICD-10-CM | POA: Insufficient documentation

## 2023-03-23 DIAGNOSIS — Z01812 Encounter for preprocedural laboratory examination: Secondary | ICD-10-CM

## 2023-03-23 DIAGNOSIS — E119 Type 2 diabetes mellitus without complications: Secondary | ICD-10-CM

## 2023-03-23 DIAGNOSIS — Z01818 Encounter for other preprocedural examination: Secondary | ICD-10-CM | POA: Diagnosis present

## 2023-03-23 DIAGNOSIS — Z01810 Encounter for preprocedural cardiovascular examination: Secondary | ICD-10-CM | POA: Diagnosis not present

## 2023-03-23 HISTORY — DX: Mechanical loosening of other internal prosthetic joint, initial encounter: Z96.659

## 2023-03-23 HISTORY — DX: Presence of unspecified artificial knee joint: T84.038A

## 2023-03-23 HISTORY — DX: Pneumonia, unspecified organism: J18.9

## 2023-03-23 HISTORY — DX: Anxiety disorder, unspecified: F41.9

## 2023-03-23 LAB — TYPE AND SCREEN
ABO/RH(D): O POS
Antibody Screen: NEGATIVE

## 2023-03-23 LAB — COMPREHENSIVE METABOLIC PANEL
ALT: 15 U/L (ref 0–44)
AST: 21 U/L (ref 15–41)
Albumin: 3.9 g/dL (ref 3.5–5.0)
Alkaline Phosphatase: 59 U/L (ref 38–126)
Anion gap: 6 (ref 5–15)
BUN: 36 mg/dL — ABNORMAL HIGH (ref 8–23)
CO2: 26 mmol/L (ref 22–32)
Calcium: 8.9 mg/dL (ref 8.9–10.3)
Chloride: 108 mmol/L (ref 98–111)
Creatinine, Ser: 1.16 mg/dL (ref 0.61–1.24)
GFR, Estimated: >60 mL/min (ref >60)
Glucose, Bld: 109 mg/dL — ABNORMAL HIGH (ref 70–99)
Potassium: 5.8 mmol/L — ABNORMAL HIGH (ref 3.5–5.1)
Sodium: 140 mmol/L (ref 135–145)
Total Bilirubin: 0.4 mg/dL (ref 0.3–1.2)
Total Protein: 7.2 g/dL (ref 6.5–8.1)

## 2023-03-23 LAB — CBC WITH DIFFERENTIAL/PLATELET
Abs Immature Granulocytes: 0.02 10*3/uL (ref 0.00–0.07)
Basophils Absolute: 0 10*3/uL (ref 0.0–0.1)
Basophils Relative: 1 %
Eosinophils Absolute: 0.7 10*3/uL — ABNORMAL HIGH (ref 0.0–0.5)
Eosinophils Relative: 11 %
HCT: 32.4 % — ABNORMAL LOW (ref 39.0–52.0)
Hemoglobin: 10.5 g/dL — ABNORMAL LOW (ref 13.0–17.0)
Immature Granulocytes: 0 %
Lymphocytes Relative: 18 %
Lymphs Abs: 1.1 10*3/uL (ref 0.7–4.0)
MCH: 30.8 pg (ref 26.0–34.0)
MCHC: 32.4 g/dL (ref 30.0–36.0)
MCV: 95 fL (ref 80.0–100.0)
Monocytes Absolute: 0.5 10*3/uL (ref 0.1–1.0)
Monocytes Relative: 8 %
Neutro Abs: 3.9 10*3/uL (ref 1.7–7.7)
Neutrophils Relative %: 62 %
Platelets: 126 10*3/uL — ABNORMAL LOW (ref 150–400)
RBC: 3.41 MIL/uL — ABNORMAL LOW (ref 4.22–5.81)
RDW: 13.7 % (ref 11.5–15.5)
WBC: 6.1 10*3/uL (ref 4.0–10.5)
nRBC: 0 % (ref 0.0–0.2)

## 2023-03-23 LAB — URINALYSIS, ROUTINE W REFLEX MICROSCOPIC
Bilirubin Urine: NEGATIVE
Glucose, UA: NEGATIVE mg/dL
Hgb urine dipstick: NEGATIVE
Ketones, ur: NEGATIVE mg/dL
Leukocytes,Ua: NEGATIVE
Nitrite: NEGATIVE
Protein, ur: NEGATIVE mg/dL
Specific Gravity, Urine: 1.018 (ref 1.005–1.030)
pH: 5 (ref 5.0–8.0)

## 2023-03-23 LAB — SURGICAL PCR SCREEN
MRSA, PCR: NEGATIVE
Staphylococcus aureus: NEGATIVE

## 2023-03-23 NOTE — Progress Notes (Signed)
  [facility] Perioperative Services: Pre-Admission/Anesthesia Testing  Abnormal Lab Notification   Date: 03/23/23  Name: Brian Kramer MRN:   [id number]  Re: Abnormal labs noted during PAT appointment   Notified:    Provider Name Provider Role Notification Mode  Reinaldo Berber, MD Orthopedics Routed and/or faxed via [facility]   ABNORMAL LAB VALUE(S):   Lab Results  Component Value Date   K 5.8 (H) 03/23/2023   Clinical Information and Notes:  Brian Kramer is scheduled for a LEFT TOTAL KNEE REVISION, BOTH COMPONENTS on 04/06/2023.   Preoperative K+ elevated to 5.8 mmol/L. In speaking with lab, there was no concern for sample hemolysis. BUN 36 mg/dl with a serum creatinine of 1.16 mg/dL, effectively ruling out dehydration as a potential etiology. EKG was normal revealing SB with no ST to T waves changes.   In review of his medical list, patient is not on any type of K+ supplementation. There is nothing that stands out as being a causative factor in the noted hyperkalemia on today's labs. Quite the contrary, many of patient's prescribed medications are generally associated with the lowering of serum potassium levels.   Presumably dietary in nature. Order placed to have K+ rechecked on the day of surgery to ensure that patient is at a safe level to proceed with general anesthesia for his knee revision. Forwarding today's result to primary attending surgeon to make him aware, thereby allowing for review prior to upcoming surgery date.   Quentin Mulling, MSN, APRN, FNP-C, CEN [facility]  Perioperative Services Nurse Practitioner Phone: [phone] Fax: [phone] 03/23/23 1:39 PM

## 2023-03-23 NOTE — Patient Instructions (Addendum)
Your procedure is scheduled on: 04/06/23 - Monday Report to the Registration Desk on the 1st floor of [facility]. To find out your arrival time, please call [phone] between 1PM - 3PM on: 04/03/23 - Friday If your arrival time is 6:00 am, do not arrive before that time as the [facility] entrance doors do not open until 6:00 am.  REMEMBER: Instructions that are not followed completely may result in serious medical risk, up to and including death; or upon the discretion of your surgeon and anesthesiologist your surgery may need to be rescheduled.  Do not eat food after midnight the night before surgery.  No gum chewing or hard candies.  You may however, drink CLEAR liquids up to 2 hours before you are scheduled to arrive for your surgery. Do not drink anything within 2 hours of your scheduled arrival time.  Clear liquids include: - water  - apple juice without pulp - gatorade (not RED colors) - black coffee or tea (Do NOT add milk or creamers to the coffee or tea) Do NOT drink anything that is not on this list.   In addition, your doctor has ordered for you to drink the provided:  Gatorade G2 Drinking this carbohydrate drink up to two hours before surgery helps to reduce insulin resistance and improve patient outcomes. Please complete drinking 2 hours before scheduled arrival time.  One week prior to surgery beginning 03/30/23:   Stop Anti-inflammatories (NSAIDS) such as Advil, Aleve, Ibuprofen, Motrin, Naproxen, Naprosyn and Aspirin based products such as Excedrin, Goody's Powder, BC Powder. You may take Tylenol if needed for pain up until the day of surgery.  Stop ANY OVER THE COUNTER supplements until after surgery : Multiple Vitamins-Minerals , Calcium - Vit D,     ON THE DAY OF SURGERY ONLY TAKE THESE MEDICATIONS WITH SIPS OF WATER:  DULoxetine  TRELEGY ELLIPTA  LYRICA  ambrisentan (LETAIRIS)   Use inhaler albuterol (VENTOLIN HFA)  on the day of surgery and  bring to the hospital.   No Alcohol for 24 hours before or after surgery.  No Smoking including e-cigarettes for 24 hours before surgery.  No chewable tobacco products for at least 6 hours before surgery.  No nicotine patches on the day of surgery.  Do not use any "recreational" drugs for at least a week (preferably 2 weeks) before your surgery.  Please be advised that the combination of cocaine and anesthesia may have negative outcomes, up to and including death. If you test positive for cocaine, your surgery will be cancelled.  On the morning of surgery brush your teeth with toothpaste and water, you may rinse your mouth with mouthwash if you wish. Do not swallow any toothpaste or mouthwash.  Use CHG Soap or wipes as directed on instruction sheet.  Do not wear jewelry, make-up, hairpins, clips or nail polish.  For welded (permanent) jewelry: bracelets, anklets, waist bands, etc.  Please have this removed prior to surgery.  If it is not removed, there is a chance that hospital personnel will need to cut it off on the day of surgery.  Do not wear lotions, powders, or perfumes.   Do not shave body hair from the neck down 48 hours before surgery.  Contact lenses, hearing aids and dentures may not be worn into surgery.  Do not bring valuables to the hospital. [facility] is not responsible for any missing/lost belongings or valuables.   Notify your doctor if there is any change in your medical  condition (cold, fever, infection).  Wear comfortable clothing (specific to your surgery type) to the hospital.  After surgery, you can help prevent lung complications by doing breathing exercises.  Take deep breaths and cough every 1-2 hours. Your doctor may order a device called an Incentive Spirometer to help you take deep breaths. When coughing or sneezing, hold a pillow firmly against your incision with both hands. This is called "splinting." Doing this helps protect your incision. It also  decreases belly discomfort.  If you are being admitted to the hospital overnight, leave your suitcase in the car. After surgery it may be brought to your room.  In case of increased patient census, it may be necessary for you, the patient, to continue your postoperative care in the Same Day Surgery department.  If you are being discharged the day of surgery, you will not be allowed to drive home. You will need a responsible individual to drive you home and stay with you for 24 hours after surgery.   If you are taking public transportation, you will need to have a responsible individual with you.  Please call the Pre-admissions Testing Dept. at [phone] if you have any questions about these instructions.  Surgery Visitation Policy:  Patients having surgery or a procedure may have two visitors.  Children under the age of 24 must have an adult with them who is not the patient.  Inpatient Visitation:    Visiting hours are 7 a.m. to 8 p.m. Up to four visitors are allowed at one time in a patient room. The visitors may rotate out with other people during the day.  One visitor age 62 or older may stay with the patient overnight and must be in the room by 8 p.m.   Pre-operative 5 CHG Bath Instructions   You can play a key role in reducing the risk of infection after surgery. Your skin needs to be as free of germs as possible. You can reduce the number of germs on your skin by washing with CHG (chlorhexidine gluconate) soap before surgery. CHG is an antiseptic soap that kills germs and continues to kill germs even after washing.   DO NOT use if you have an allergy to chlorhexidine/CHG or antibacterial soaps. If your skin becomes reddened or irritated, stop using the CHG and notify one of our RNs at [phone].   Please shower with the CHG soap starting 4 days before surgery using the following schedule: 10/24 - 10/28.    Please keep in mind the following:  DO NOT shave, including  legs and underarms, starting the day of your first shower.   You may shave your face at any point before/day of surgery.  Place clean sheets on your bed the day you start using CHG soap. Use a clean washcloth (not used since being washed) for each shower. DO NOT sleep with pets once you start using the CHG.   CHG Shower Instructions:  If you choose to wash your hair and private area, wash first with your normal shampoo/soap.  After you use shampoo/soap, rinse your hair and body thoroughly to remove shampoo/soap residue.  Turn the water OFF and apply about 3 tablespoons (45 ml) of CHG soap to a CLEAN washcloth.  Apply CHG soap ONLY FROM YOUR NECK DOWN TO YOUR TOES (washing for 3-5 minutes)  DO NOT use CHG soap on face, private areas, open wounds, or sores.  Pay special attention to the area where your surgery is being performed.  If  you are having back surgery, having someone wash your back for you may be helpful. Wait 2 minutes after CHG soap is applied, then you may rinse off the CHG soap.  Pat dry with a clean towel  Put on clean clothes/pajamas   If you choose to wear lotion, please use ONLY the CHG-compatible lotions on the back of this paper.     Additional instructions for the day of surgery: DO NOT APPLY any lotions, deodorants, cologne, or perfumes.   Put on clean/comfortable clothes.  Brush your teeth.  Ask your nurse before applying any prescription medications to the skin.      CHG Compatible Lotions   Aveeno Moisturizing lotion  Cetaphil Moisturizing Cream  Cetaphil Moisturizing Lotion  Clairol Herbal Essence Moisturizing Lotion, Dry Skin  Clairol Herbal Essence Moisturizing Lotion, Extra Dry Skin  Clairol Herbal Essence Moisturizing Lotion, Normal Skin  Curel Age Defying Therapeutic Moisturizing Lotion with Alpha Hydroxy  Curel Extreme Care Body Lotion  Curel Soothing Hands Moisturizing Hand Lotion  Curel Therapeutic Moisturizing Cream, Fragrance-Free  Curel  Therapeutic Moisturizing Lotion, Fragrance-Free  Curel Therapeutic Moisturizing Lotion, Original Formula  Eucerin Daily Replenishing Lotion  Eucerin Dry Skin Therapy Plus Alpha Hydroxy Crme  Eucerin Dry Skin Therapy Plus Alpha Hydroxy Lotion  Eucerin Original Crme  Eucerin Original Lotion  Eucerin Plus Crme Eucerin Plus Lotion  Eucerin TriLipid Replenishing Lotion  Keri Anti-Bacterial Hand Lotion  Keri Deep Conditioning Original Lotion Dry Skin Formula Softly Scented  Keri Deep Conditioning Original Lotion, Fragrance Free Sensitive Skin Formula  Keri Lotion Fast Absorbing Fragrance Free Sensitive Skin Formula  Keri Lotion Fast Absorbing Softly Scented Dry Skin Formula  Keri Original Lotion  Keri Skin Renewal Lotion Keri Silky Smooth Lotion  Keri Silky Smooth Sensitive Skin Lotion  Nivea Body Creamy Conditioning Oil  Nivea Body Extra Enriched Lotion  Nivea Body Original Lotion  Nivea Body Sheer Moisturizing Lotion Nivea Crme  Nivea Skin Firming Lotion  NutraDerm 30 Skin Lotion  NutraDerm Skin Lotion  NutraDerm Therapeutic Skin Cream  NutraDerm Therapeutic Skin Lotion  ProShield Protective Hand Cream  Provon moisturizing lotion  How to Use an Incentive Spirometer  An incentive spirometer is a tool that measures how well you are filling your lungs with each breath. Learning to take long, deep breaths using this tool can help you keep your lungs clear and active. This may help to reverse or lessen your chance of developing breathing (pulmonary) problems, especially infection. You may be asked to use a spirometer: After a surgery. If you have a lung problem or a history of smoking. After a long period of time when you have been unable to move or be active. If the spirometer includes an indicator to show the highest number that you have reached, your health care provider or respiratory therapist will help you set a goal. Keep a log of your progress as told by your health care  provider. What are the risks? Breathing too quickly may cause dizziness or cause you to pass out. Take your time so you do not get dizzy or light-headed. If you are in pain, you may need to take pain medicine before doing incentive spirometry. It is harder to take a deep breath if you are having pain. How to use your incentive spirometer  Sit up on the edge of your bed or on a chair. Hold the incentive spirometer so that it is in an upright position. Before you use the spirometer, breathe out normally. Place the mouthpiece  in your mouth. Make sure your lips are closed tightly around it. Breathe in slowly and as deeply as you can through your mouth, causing the piston or the ball to rise toward the top of the chamber. Hold your breath for 3-5 seconds, or for as long as possible. If the spirometer includes a coach indicator, use this to guide you in breathing. Slow down your breathing if the indicator goes above the marked areas. Remove the mouthpiece from your mouth and breathe out normally. The piston or ball will return to the bottom of the chamber. Rest for a few seconds, then repeat the steps 10 or more times. Take your time and take a few normal breaths between deep breaths so that you do not get dizzy or light-headed. Do this every 1-2 hours when you are awake. If the spirometer includes a goal marker to show the highest number you have reached (best effort), use this as a goal to work toward during each repetition. After each set of 10 deep breaths, cough a few times. This will help to make sure that your lungs are clear. If you have an incision on your chest or abdomen from surgery, place a pillow or a rolled-up towel firmly against the incision when you cough. This can help to reduce pain while taking deep breaths and coughing. General tips When you are able to get out of bed: Walk around often. Continue to take deep breaths and cough in order to clear your lungs. Keep using the  incentive spirometer until your health care provider says it is okay to stop using it. If you have been in the hospital, you may be told to keep using the spirometer at home. Contact a health care provider if: You are having difficulty using the spirometer. You have trouble using the spirometer as often as instructed. Your pain medicine is not giving enough relief for you to use the spirometer as told. You have a fever. Get help right away if: You develop shortness of breath. You develop a cough with bloody mucus from the lungs. You have fluid or blood coming from an incision site after you cough. Summary An incentive spirometer is a tool that can help you learn to take long, deep breaths to keep your lungs clear and active. You may be asked to use a spirometer after a surgery, if you have a lung problem or a history of smoking, or if you have been inactive for a long period of time. Use your incentive spirometer as instructed every 1-2 hours while you are awake. If you have an incision on your chest or abdomen, place a pillow or a rolled-up towel firmly against your incision when you cough. This will help to reduce pain. Get help right away if you have shortness of breath, you cough up bloody mucus, or blood comes from your incision when you cough. This information is not intended to replace advice given to you by your health care provider. Make sure you discuss any questions you have with your health care provider.    POLAR CARE INFORMATION  MassAdvertisement.it  How to use [facility] Polar Care [facility] Therapy System?  YouTube   ShippingScam.co.uk  OPERATING INSTRUCTIONS  Start the product With dry hands, connect the transformer to the electrical connection located on the top of the cooler. Next, plug the transformer into an appropriate electrical outlet. The unit will automatically start running at this point.  To stop the pump, disconnect electrical power.  Unplug  to stop the product when not in use. Unplugging the Polar Care unit turns it off. Always unplug immediately after use. Never leave it plugged in while unattended. Remove pad.    FIRST ADD WATER TO FILL LINE, THEN ICE---Replace ice when existing ice is almost melted  1 Discuss Treatment with your Licensed Health Care Practitioner and Use Only as Prescribed 2 Apply Insulation Barrier & Cold Therapy Pad 3 Check for Moisture 4 Inspect Skin Regularly  Tips and Trouble Shooting Usage Tips 1. Use cubed or chunked ice for optimal performance. 2. It is recommended to drain the Pad between uses. To drain the pad, hold the Pad upright with the hose pointed toward the ground. Depress the black plunger and allow water to drain out. 3. You may disconnect the Pad from the unit without removing the pad from the affected area by depressing the silver tabs on the hose coupling and gently pulling the hoses apart. The Pad and unit will seal itself and will not leak. Note: Some dripping during release is normal. 4. DO NOT RUN PUMP WITHOUT WATER! The pump in this unit is designed to run with water. Running the unit without water will cause permanent damage to the pump. 5. Unplug unit before removing lid.  TROUBLESHOOTING GUIDE Pump not running, Water not flowing to the pad, Pad is not getting cold 1. Make sure the transformer is plugged into the wall outlet. 2. Confirm that the ice and water are filled to the indicated levels. 3. Make sure there are no kinks in the pad. 4. Gently pull on the blue tube to make sure the tube/pad junction is straight. 5. Remove the pad from the treatment site and ll it while the pad is lying at; then reapply. 6. Confirm that the pad couplings are securely attached to the unit. Listen for the double clicks (Figure 1) to confirm the pad couplings are securely attached.  Leaks    Note: Some condensation on the lines, controller, and pads is unavoidable, especially in warmer  climates. 1. If using a Breg Polar Care Cold Therapy unit with a detachable Cold Therapy Pad, and a leak exists (other than condensation on the lines) disconnect the pad couplings. Make sure the silver tabs on the couplings are depressed before reconnecting the pad to the pump hose; then confirm both sides of the coupling are properly clicked in. 2. If the coupling continues to leak or a leak is detected in the pad itself, stop using it and call [employer] Customer Care at [phone].  Cleaning After use, empty and dry the unit with a soft cloth. Warm water and mild detergent may be used occasionally to clean the pump and tubes.  WARNING: The Polar Care Cube can be cold enough to cause serious injury, including full skin necrosis. Follow these Operating Instructions, and carefully read the Product Insert (see pouch on side of unit) and the Cold Therapy Pad Fitting Instructions (provided with each Cold Therapy Pad) prior to use.

## 2023-04-01 ENCOUNTER — Encounter: Payer: Self-pay | Admitting: Orthopedic Surgery

## 2023-04-01 NOTE — Progress Notes (Signed)
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 Clinical nursing staff has updated patient's PMH/PSHx, current medication list, and drug allergies/intolerances to ensure comprehensive history available to assist in medical decision making as it pertains to the aforementioned surgical procedure and anticipated anesthetic course. Extensive review of available clinical information personally performed. Brian Kramer PMH and PSHx updated with any diagnoses/procedures that  may have been inadvertently omitted during his intake with the pre-admission testing department's nursing staff.  Clinical Discussion:  Brian Kramer is a 77 y.o. male who is submitted for pre-surgical anesthesia review and clearance prior to him undergoing the above procedure. Patient is a Former Smoker (quit 03/1994). Pertinent PMH includes: CAD, MI, diastolic dysfunction, pulmonary hypertension, angina, IRBBB, HTN, HLD, T2DM, dyspnea, CKD-III, OSAH (no nocturnal PAP therapy), GERD (no daily Tx), PUD, hiatal hernia (s/p repair), anemia, thrombocytopenia, peripheral neuropathy, OA, RLS, peripheral neuropathy, essential tremor, depression, insomnia, Lewy body dementia.  Patient is  followed by cardiology Juliann Pares, MD). He was last seen in the cardiology clinic on 11/04/2022; notes reviewed. At the time of his clinic visit, patient doing well overall from a cardiovascular perspective. Patient denied any chest pain, shortness of breath, PND, orthopnea, palpitations, significant peripheral edema, weakness, fatigue, vertiginous symptoms, or presyncope/syncope. Patient with a past medical history significant for cardiovascular diagnoses. Documented physical exam was grossly benign, providing no evidence of acute exacerbation and/or decompensation of the patient's known cardiovascular conditions. Of note, complete records regarding patient's cardiovascular history not available for review at time of consult. Information gathered from patient and notes from his local cardiologist.   Patient reported to have suffered an MI in 1996.  From what I gather from the notes, patient underwent diagnostic left heart catheterization, which revealed no obstructive disease.  He did not undergo any type of PCI.   Patient underwent diagnostic LEFT heart catheterization in 1997.  During the study, obstructive disease was noted.  PCI was subsequently performed placing a stent of (unknown type) in the RCA.   Repeat diagnostic LEFT heart catheterization was performed in 2000 revealing obstructive CAD.  PCI subsequently performed placing a stent (unknown type) to the LCx.   Patient has subsequently undergone diagnostic  LEFT heart catheterizations on 04/02/2010 and on 04/30/2015 revealing widely patent stents and no evidence of obstructive CAD.  Recommendations were for continued medical management.   Myocardial perfusion imaging study performed on 01/26/2020 revealing a low normal ventricular systolic function with an EF of 50-55%.  There was a medium defect of moderate severity present in the basal inferolateral and mid inferolateral locations consistent with previous infarction versus soft tissue  attenuation but no reversible ischemia.  There were no significant ECG changes noted during the study.  Study determined to be intermediate  risk.   Diagnostic RIGHT/LEFT heart catheterization performed on 05/01/2020 revealed a normal left trickle systolic function with an EF of 60%.  Left heart catheterization was essentially normal revealing no obstructive CAD.  Mean PA pressure = 30 mmHg, mean PCWP = 16 mmHg, RVEDP = 19 mmHg, AO sat = 95%, PA sat = 74%, CO = 8.2 L/min, CI = 3.37 L/min/m, PVR 147, SVR 736.  Findings consistent with pulmonary hypertension.   Last TTE was performed on 09/05/2021 revealing a low normal left ventricular systolic function with an EF of 50%. Diastolic Doppler parameters consistent with abnormal relaxation (G1DD).  There was mild biatrial and right ventricular enlargement.  There was trivial to mild mitral, tricuspid, and pulmonary valve regurgitation.  RVSP elevated at 50 mmHg consistent with known mild pulmonary hypertension diagnosis.All transvalvular gradients were noted to be normal providing no evidence suggestive of valvular stenosis. Aorta normal in size with no evidence of aneurysmal dilatation.  Blood pressure reasonably controlled at 130/72 mmHg without the use of pharmacological interventions. Patient is on ambrisentan for his pulmonary hypertension.  Patient is on rosuvastatin for his HLD diagnosis and ASCVD prevention. Patient has a supply of short acting nitrates to use on an as needed basis for recurrent angina/anginal equivalent symptoms; denied recent use. Patient is not diabetic. He does have an OSAH diagnosis, however he does not utilize nocturnal PAP therapy. Patient does require supplemental oxygen at night due to nocturnal desaturations/hypoxia. Functional capacity is somewhat limited by patient's age and multiple medical comorbidities.  With that said, patient is able to complete all of his ADL/IADLs without significant cardiovascular limitation.  Per the  [facility], patient is able to achieve at least 4 METS of physical activity without experiencing any significant degree of angina/anginal equivalent symptoms No changes were made to his medication regimen during his visit with cardiology.  Patient scheduled to follow-up with outpatient cardiology in 6 months or sooner if needed.  Brian Kramer is scheduled for an elective LEFT TOTAL KNEE REVISION, BOTH COMPONENTS on 04/06/2023 with Dr. Reinaldo Berber, MD.  Given patient's past medical history significant for cardiovascular and cardiopulmonary diagnoses, presurgical clearances were sought from patient's primary pulmonary medicine and cardiology teams.  Specialty clearances were obtained as follows.  Per pulmonary medicine Meredeth Ide, MD), "this patient is optimized for surgery, from a pulmonary perspective, and may proceed with surgery at a LOW risk of significant perioperative cardiopulmonary complications".  Per cardiology Juliann Pares, MD), "this patient is optimized for surgery and may proceed with the planned procedural course with a LOW/ACCEPTABLE risk of significant perioperative cardiovascular complications".  In review of his medication reconciliation, it is noted that patient is currently on prescribed daily antithrombotic therapy. He has been instructed on recommendations for holding his daily low dose ASA for 7 days prior to his procedure with plans to restart as soon as postoperative bleeding risk felt to be minimized by his attending surgeon. The patient has been instructed that his last dose of his ASA should be on 03/29/2023.  Patient denies previous perioperative complications with anesthesia in the past. In review of the available records, it is noted that patient underwent a general + neuraxial anesthetic course here at [facility] ([facility]) in 12/2021 without documented complications.      03/23/2023    9:11 AM 06/28/2022    8:27 AM 06/28/2022    5:38 AM   Vitals with BMI  Weight 263 lbs 14 oz    Systolic 138 97 110  Diastolic 56 40 40  Pulse 51 62 72    Providers/Specialists:   NOTE: Primary physician provider listed below. Patient may have been seen by APP or partner within same practice.   PROVIDER ROLE / SPECIALTY LAST Wilber Bihari, MD Orthopedics (Surgeon) 03/25/2023  Marisue Ivan, MD Primary Care Provider 12/24/2022  Rudean Hitt, MD Cardiology 11/04/2022  Cristopher Peru, MD Neurology 02/18/2023  Ned Clines, MD Pulmonary Medicine 01/12/2023  Mady Haagensen, MD Nephrology 12/23/2022   Allergies:  Tape, Penicillins, and Shellfish allergy  Current Home Medications:   No current facility-administered medications for this encounter.    albuterol (VENTOLIN HFA) 108 (90 Base) MCG/ACT inhaler   ambrisentan (LETAIRIS) 5 MG tablet   aspirin EC 81 MG tablet   azelastine (ASTELIN) 0.1 % nasal spray   Calcium 500-100 MG-UNIT CHEW   DULoxetine HCl 30 MG CSDR   ferrous sulfate 325 (65 FE) MG tablet   Fluticasone-Umeclidin-Vilant (TRELEGY ELLIPTA) 100-62.5-25 MCG/ACT AEPB   ipratropium (ATROVENT) 0.03 % nasal spray   LYRICA 200 MG capsule   mometasone (ELOCON) 0.1 % lotion   Multiple Vitamins-Minerals (BARIATRIC MULTIVITAMINS/IRON PO)   nitroGLYCERIN (NITROSTAT) 0.4 MG SL tablet   OXYGEN   QUEtiapine (SEROQUEL) 100 MG tablet   rOPINIRole (REQUIP XL) 4 MG 24 hr tablet   rosuvastatin (CRESTOR) 5 MG tablet   History:   Past Medical History:  Diagnosis Date   Anemia    Anginal pain (HCC)    Anxiety    Arthritis    Bilateral renal cysts    CKD (chronic kidney disease), stage III (HCC)    COPD (chronic obstructive pulmonary disease) (HCC)    Coronary artery disease    a.) MI 03/1995 - no PCI; b.) LHC/PCI 1997 - stent (unknown type) x 1 to RCA; c.) LHC/PCI 2000 - stent (unknown type) x 1 to LCx; d.) LHC 04/02/2010 - insig CAD with widely patent stents; medical mgmt; e.) LHC 04/30/2015 - patent stents;  insig CAD; medical mgmt; f.) R/LHC 05/01/20: EF 60%, norm cors.   Depression    Diastolic dysfunction 09/05/2021   a.)  TTE 09/05/2021: EF 50%, mild BAE, trivial PR, mild MR/TR; pHTN (RVSP 50 mmHg), G1DD.   Essential hypertension 04/08/2021   GERD (gastroesophageal reflux disease)    a.) improved s/p sleeve gastrectomy; no daily Tx   History of hiatal hernia 2004   a.) s/p repair   History of obstructive sleep apnea    a.) resolved s/p sleeve gastrectomy; no longer requires nocturnal PAP therapy   HLD (hyperlipidemia)    Incomplete right bundle branch block (RBBB)    Insomnia    Lewy body dementia (HCC)    Long term current use of aspirin    Loosening of knee joint prosthesis (HCC)    Migraines    Myocardial infarction (HCC) 03/1995   a.) full details unknown; LHC performed, however no PCI performed.   On supplemental oxygen by nasal cannula    a.) 2L/[place] PRN   PAD (peripheral artery disease) (HCC) 04/08/2021   Peripheral neuropathy    Pneumonia    Presbycusis    PUD (peptic ulcer disease)    Pulmonary hypertension (HCC)    a.) takes ambrisentan; b.)  R/LHC 05/01/2020: EF 60%, mean PA 30, mean PCWP 16, RVEDP 19, Ao sat 95, PA sat 74, CO 8.2, PVR 137, SVR 736; c.)  TTE 09/05/2021: EF 50%, RVSP 50 mmHg.   RLS (restless legs syndrome)    a.) takes ropinirole   SOB (  shortness of breath) 06/28/2022   Thrombocytopenia (HCC)    Tremor    Type 2 diabetes, diet controlled [facility])    Past Surgical History:  Procedure Laterality Date   AMPUTATION TOE Right 12/20/2021   Procedure: AMPUTATION TOE - SECOND AND THIRD - 28825;  Surgeon: Linus Galas, DPM;  Location: [facility] ORS;  Service: Podiatry;  Laterality: Right;   CARPOMETACARPAL ([facility]) FUSION OF THUMB Right 04/08/2018   Procedure: CARPOMETACARPAL [facility]) FUSION OF THUMB;  Surgeon: Kennedy Bucker, MD;  Location: [facility] ORS;  Service: Orthopedics;  Laterality: Right;   CARPOMETACARPAL ([facility]) FUSION OF THUMB Left 12/02/2018   Procedure:  CARPOMETACARPAL ([facility]) FUSION OF  LEFT THUMB, DIABETIC;  Surgeon: Kennedy Bucker, MD;  Location: [facility]S;  Service: Orthopedics;  Laterality: Left;   CATARACT EXTRACTION Bilateral 2015   COLONOSCOPY     COLONOSCOPY WITH PROPOFOL N/A 05/19/2018   Procedure: COLONOSCOPY WITH PROPOFOL;  Surgeon: Toledo, Boykin Nearing, MD;  Location: [facility];  Service: Gastroenterology;  Laterality: N/A;   CORONARY ANGIOPLASTY WITH STENT PLACEMENT Left 1997   Procedure: CORONARY ANGIOPLASTY WITH STENT PLACEMENT; stent x1 (unknown type) to RCA   CORONARY ANGIOPLASTY WITH STENT PLACEMENT Left 2000   Procedure: CORONARY ANGIOPLASTY WITH STENT PLACEMENT; stent x1 (unknown type) to LCx   EXCISION PARTIAL PHALANX Left 10/04/2021   Procedure: EXCISION PARTIAL PHALANX - 2, 3, 4;  Surgeon: Linus Galas, DPM;  Location: [facility];  Service: Podiatry;  Laterality: Left;   EYE SURGERY     cataract bilateral with lens   FLEXOR TENOTOMY  Right 12/20/2021   Procedure: FLEXOR TENOTOMY - FOURTH TOE - 16109;  Surgeon: Linus Galas, DPM;  Location: [facility];  Service: Podiatry;  Laterality: Right;   HAMMER TOE SURGERY Right 01/15/2018   Procedure: HAMMER TOE CORRECTION-2ND & 3RD;  Surgeon: Recardo Evangelist, DPM;  Location: [facility];  Service: Podiatry;  Laterality: Right;   HEEL SPUR RESECTION  2012   INGUINAL HERNIA REPAIR Bilateral    KNEE ARTHROSCOPY Right 2005   KNEE ARTHROSCOPY Left 2006   LAPAROSCOPIC GASTRIC SLEEVE RESECTION N/A 03/17/2016   LEFT HEART CATH AND CORONARY ANGIOGRAPHY Left 04/02/2010   Procedure: LEFT HEART CATH AND CORONARY ANGIOGRAPHY; Location: [facility]; Surgeon: Phil Dopp, MD   LEFT HEART CATH AND CORONARY ANGIOGRAPHY Left 04/30/2015   Procedure: LEFT HEART CATH AND CORONARY ANGIOGRAPHY; Location: [facility]; Surgeon: Aletha Halim, MD   RIGHT AND LEFT HEART CATH Left 05/01/2020   Procedure: RIGHT AND LEFT HEART CATH;  Surgeon: Alwyn Pea, MD;  Location: [facility];  Service: Cardiovascular;   Laterality: Left;   Right shoulder AC separation surgery in 1995 Right    Stem Cells to left heel  2017   TONSILLECTOMY     TOTAL KNEE ARTHROPLASTY Left 2007   TOTAL KNEE ARTHROPLASTY Right 2015   ULNAR TUNNEL RELEASE Left 04/14/2019   Procedure: LEFT CUBITAL TUNNEL RELEASE;  Surgeon: Kennedy Bucker, MD;  Location: [facility];  Service: Orthopedics;  Laterality: Left;   UPPER GI ENDOSCOPY  2017   VASECTOMY     1970s   WEIL OSTEOTOMY Right 01/15/2018   Procedure: WEIL OSTEOTOMY-2ND & 3RD;  Surgeon: Recardo Evangelist, DPM;  Location: [facility] ORS;  Service: Podiatry;  Laterality: Right;   No family history on file. Social History   Tobacco Use   Smoking status: Former    Current packs/day: 0.00    Types: Cigarettes    Quit date: 03/09/1994    Years since quitting: 29.0   Smokeless  tobacco: Never  Vaping Use   Vaping status: Never Used  Substance Use Topics   Alcohol use: Yes    Alcohol/week: 1.0 - 2.0 standard drink of alcohol    Types: 1 - 2 Standard drinks or equivalent per week    Comment: 1-2 per week   Drug use: Never    Pertinent Clinical Results:  LABS:   No visits with results within 3 Day(s) from this visit.  Latest known visit with results is:  Hospital Outpatient Visit on 03/23/2023  Component Date Value Ref Range Status   MRSA, PCR 03/23/2023 NEGATIVE  NEGATIVE Final   Staphylococcus aureus 03/23/2023 NEGATIVE  NEGATIVE Final   Comment: (NOTE) The Xpert SA Assay (FDA approved for NASAL specimens in patients 62 years of age and older), is one component of a comprehensive surveillance program. It is not intended to diagnose infection nor to guide or monitor treatment. Performed at [facility], [address] [place], [place] [address]    WBC 03/23/2023 6.1  4.0 - 10.5 K/uL Final   RBC 03/23/2023 3.41 (L)  4.22 - 5.81 MIL/uL Final   Hemoglobin 03/23/2023 10.5 (L)  13.0 - 17.0 g/dL Final   HCT 69/62/9528 32.4 (L)  39.0 - 52.0 % Final   MCV 03/23/2023  95.0  80.0 - 100.0 fL Final   MCH 03/23/2023 30.8  26.0 - 34.0 pg Final   MCHC 03/23/2023 32.4  30.0 - 36.0 g/dL Final   RDW 41/32/4401 13.7  11.5 - 15.5 % Final   Platelets 03/23/2023 126 (L)  150 - 400 K/uL Final   nRBC 03/23/2023 0.0  0.0 - 0.2 % Final   Neutrophils Relative % 03/23/2023 62  % Final   Neutro Abs 03/23/2023 3.9  1.7 - 7.7 K/uL Final   Lymphocytes Relative 03/23/2023 18  % Final   Lymphs Abs 03/23/2023 1.1  0.7 - 4.0 K/uL Final   Monocytes Relative 03/23/2023 8  % Final   Monocytes Absolute 03/23/2023 0.5  0.1 - 1.0 K/uL Final   Eosinophils Relative 03/23/2023 11  % Final   Eosinophils Absolute 03/23/2023 0.7 (H)  0.0 - 0.5 K/uL Final   Basophils Relative 03/23/2023 1  % Final   Basophils Absolute 03/23/2023 0.0  0.0 - 0.1 K/uL Final   Immature Granulocytes 03/23/2023 0  % Final   Abs Immature Granulocytes 03/23/2023 0.02  0.00 - 0.07 K/uL Final   Performed at [facility], [address] [place], [place] [address]   Sodium 03/23/2023 140  135 - 145 mmol/L Final   Potassium 03/23/2023 5.8 (H)  3.5 - 5.1 mmol/L Final   Chloride 03/23/2023 108  98 - 111 mmol/L Final   CO2 03/23/2023 26  22 - 32 mmol/L Final   Glucose, Bld 03/23/2023 109 (H)  70 - 99 mg/dL Final   Glucose reference range applies only to samples taken after fasting for at least 8 hours.   BUN 03/23/2023 36 (H)  8 - 23 mg/dL Final   Creatinine, Ser 03/23/2023 1.16  0.61 - 1.24 mg/dL Final   Calcium 36/64/4034 8.9  8.9 - 10.3 mg/dL Final   Total Protein 74/25/9563 7.2  6.5 - 8.1 g/dL Final   Albumin 87/56/4332 3.9  3.5 - 5.0 g/dL Final   AST 95/18/8416 21  15 - 41 U/L Final   ALT 03/23/2023 15  0 - 44 U/L Final   Alkaline Phosphatase 03/23/2023 59  38 - 126 U/L Final   Total Bilirubin 03/23/2023 0.4  0.3 - 1.2 mg/dL Final   GFR, Estimated 03/23/2023 >60  >60 mL/min Final   Comment: (NOTE) Calculated using the CKD-EPI Creatinine Equation (2021)    Anion gap 03/23/2023 6  5 - 15 Final    Performed at [facility], [address] [place], [place] [address]   Color, Urine 03/23/2023 YELLOW (A)  YELLOW Final   APPearance 03/23/2023 HAZY (A)  CLEAR Final   Specific Gravity, Urine 03/23/2023 1.018  1.005 - 1.030 Final   pH 03/23/2023 5.0  5.0 - 8.0 Final   Glucose, UA 03/23/2023 NEGATIVE  NEGATIVE mg/dL Final   Hgb urine dipstick 03/23/2023 NEGATIVE  NEGATIVE Final   Bilirubin Urine 03/23/2023 NEGATIVE  NEGATIVE Final   Ketones, ur 03/23/2023 NEGATIVE  NEGATIVE mg/dL Final   Protein, ur 11/91/4782 NEGATIVE  NEGATIVE mg/dL Final   Nitrite 95/62/1308 NEGATIVE  NEGATIVE Final   Leukocytes,Ua 03/23/2023 NEGATIVE  NEGATIVE Final   Performed at [facility], [address] [place], [place] [address]   ABO/RH(D) 03/23/2023 O POS   Final   Antibody Screen 03/23/2023 NEG   Final   Sample Expiration 03/23/2023 04/06/2023,2359   Final   Extend sample reason 03/23/2023    Final                   Value:NO TRANSFUSIONS OR PREGNANCY IN THE PAST 3 MONTHS Performed at [facility], [address] [place], [place] [address]     ECG: Date: 03/23/2023 Time ECG obtained: 0908 AM Rate: 55 bpm Rhythm: sinus bradycardia; IRBBB Axis (leads I and aVF): Right axis deviation Intervals: PR 178 ms. QRS 112 ms. QTc 411 ms. ST segment and T wave changes: No evidence of acute ST segment elevation or depression.   Comparison: Similar to previous tracing obtained on 06/25/2022   IMAGING / PROCEDURES: NM BONE SCAN 3 PHASE performed on 02/04/2023 Mild asymmetric increased uptake localizes to the lateral tibial plateau and around the stem of the tibial component of the left knee on the delayed phase images. Findings may reflect loosening around the tibial component of the left knee arthroplasty device. Increased uptake localizes to both patella on the delayed phase images which may be seen in the setting of patellofemoral joint dysfunction.   TRANSTHORACIC ECHOCARDIOGRAM  performed on 09/05/2021 Low normal left ventricular systolic function with an EF of 50% Diastolic Doppler parameters consistent with abnormal relaxation (G1DD). Mild BAE Mild RVE Trivial PR Mild MR and TR No AR  Pulmonary hypertension --> RVSP = 50 mmHg No pericardial effusion   RIGHT/LEFT HEART CATHETERIZATION AND CORONARY ANGIOGRAPHY performed on 05/01/2020 Essentially normal left heart cath Normal left ventricular systolic function with an EF of 60% Normal coronaries with no evidence of obstructive disease Previously placed stents widely patent Mean PA pressure = 30 mmHg Mean PCWP = 16 mmHg RVEDP = 19 mmHg Ao sat 95 PA sat 74 CO = 8.2 L/min PVR 137 SVR 736  MR BRAIN W WO CONTRAST performed on 03/12/2020 No acute intracranial abnormality. Largely stable MRI appearance of the brain since 2019, with signal changes suspicious for chronic small vessel disease. But there is a new focus of microhemorrhage or mild superficial siderosis at the inferior left parietal lobe. Stable small left anterior frontal bone lesion, favor benign hemangioma. NeuroQuant volumetric analysis of the brain, see details on [employer].  MYOCARDIAL PERFUSION IMAGING STUDY (LEXISCAN) performed on 01/26/2020 No EKG changes on lexiscan stress Mildly reduced LV function at 50-55% Fixed inferolateral defect c/w  infarction vs soft tissue attenuation with no reversible ischemia   Impression and Plan:  Brian Kramer has been referred for pre-anesthesia review and clearance prior to him undergoing the planned anesthetic and procedural courses. Available labs, pertinent testing, and imaging results were personally reviewed by me in preparation for upcoming operative/procedural course. [facility] medical record has been updated following extensive record review and patient interview with PAT staff.   This patient has been appropriately cleared by cardiology (LOW) and pulmonary medicine (LOW) with the  individually indicated risks of him experiencing significant perioperative cardiovascular/cardiopulmonary complications. Based on clinical review performed today (04/03/23), barring any significant acute changes in the patient's overall condition, it is anticipated that he will be able to proceed with the planned surgical intervention. Any acute changes in clinical condition may necessitate his procedure being postponed and/or cancelled. Patient will meet with anesthesia team (MD and/or CRNA) on the day of his procedure for preoperative evaluation/assessment. Questions regarding anesthetic course will be fielded at that time.   Pre-surgical instructions were reviewed with the patient during his PAT appointment, and questions were fielded to satisfaction by PAT clinical staff. He has been instructed on which medications that he will need to hold prior to surgery, as well as the ones that have been deemed safe/appropriate to take on the day of his procedure. As part of the general education provided by PAT, patient made aware both verbally and in writing, that he would need to abstain from the use of any illegal substances during his perioperative course.  He was advised that failure to follow the provided instructions could necessitate case cancellation or result in serious perioperative complications up to and including death. Patient encouraged to contact PAT and/or his surgeon's office to discuss any questions or concerns that may arise prior to surgery; verbalized understanding.   Brian Mulling, MSN, APRN, FNP-C, CEN [facility]  Perioperative Services Nurse Practitioner Phone: [phone] Fax: [phone] 04/03/23 2:47 PM  NOTE: This note has been prepared using Dragon dictation software. Despite my best ability to proofread, there is always the potential that unintentional transcriptional errors may still occur from this process.

## 2023-04-06 ENCOUNTER — Inpatient Hospital Stay: Payer: Medicare PPO | Admitting: Urgent Care

## 2023-04-06 ENCOUNTER — Encounter: Payer: Self-pay | Admitting: Orthopedic Surgery

## 2023-04-06 ENCOUNTER — Inpatient Hospital Stay
Admission: RE | Admit: 2023-04-06 | Discharge: 2023-04-09 | DRG: 467 | Disposition: A | Discharge status: Home Health | Payer: Medicare PPO | Attending: Orthopedic Surgery | Admitting: Orthopedic Surgery

## 2023-04-06 ENCOUNTER — Encounter
Admission: RE | Disposition: A | Discharge status: Home Health | Payer: Self-pay | Source: Home / Self Care | Attending: Orthopedic Surgery

## 2023-04-06 ENCOUNTER — Other Ambulatory Visit: Payer: Self-pay

## 2023-04-06 ENCOUNTER — Inpatient Hospital Stay: Payer: Medicare PPO

## 2023-04-06 DIAGNOSIS — K5900 Constipation, unspecified: Secondary | ICD-10-CM | POA: Diagnosis present

## 2023-04-06 DIAGNOSIS — N183 Chronic kidney disease, stage 3 unspecified: Secondary | ICD-10-CM | POA: Diagnosis present

## 2023-04-06 DIAGNOSIS — I4510 Unspecified right bundle-branch block: Secondary | ICD-10-CM | POA: Diagnosis present

## 2023-04-06 DIAGNOSIS — B370 Candidal stomatitis: Secondary | ICD-10-CM | POA: Diagnosis present

## 2023-04-06 DIAGNOSIS — Z9884 Bariatric surgery status: Secondary | ICD-10-CM

## 2023-04-06 DIAGNOSIS — Z89421 Acquired absence of other right toe(s): Secondary | ICD-10-CM

## 2023-04-06 DIAGNOSIS — I272 Pulmonary hypertension, unspecified: Secondary | ICD-10-CM | POA: Diagnosis present

## 2023-04-06 DIAGNOSIS — Z8249 Family history of ischemic heart disease and other diseases of the circulatory system: Secondary | ICD-10-CM | POA: Diagnosis not present

## 2023-04-06 DIAGNOSIS — E669 Obesity, unspecified: Secondary | ICD-10-CM | POA: Diagnosis present

## 2023-04-06 DIAGNOSIS — J449 Chronic obstructive pulmonary disease, unspecified: Secondary | ICD-10-CM | POA: Diagnosis present

## 2023-04-06 DIAGNOSIS — E1122 Type 2 diabetes mellitus with diabetic chronic kidney disease: Secondary | ICD-10-CM | POA: Diagnosis present

## 2023-04-06 DIAGNOSIS — N179 Acute kidney failure, unspecified: Secondary | ICD-10-CM | POA: Diagnosis present

## 2023-04-06 DIAGNOSIS — Z8616 Personal history of COVID-19: Secondary | ICD-10-CM | POA: Diagnosis not present

## 2023-04-06 DIAGNOSIS — E785 Hyperlipidemia, unspecified: Secondary | ICD-10-CM | POA: Diagnosis present

## 2023-04-06 DIAGNOSIS — R1310 Dysphagia, unspecified: Secondary | ICD-10-CM | POA: Diagnosis not present

## 2023-04-06 DIAGNOSIS — Z79899 Other long term (current) drug therapy: Secondary | ICD-10-CM

## 2023-04-06 DIAGNOSIS — Z87891 Personal history of nicotine dependence: Secondary | ICD-10-CM

## 2023-04-06 DIAGNOSIS — E860 Dehydration: Secondary | ICD-10-CM | POA: Diagnosis not present

## 2023-04-06 DIAGNOSIS — F321 Major depressive disorder, single episode, moderate: Secondary | ICD-10-CM | POA: Diagnosis present

## 2023-04-06 DIAGNOSIS — T84033A Mechanical loosening of internal left knee prosthetic joint, initial encounter: Principal | ICD-10-CM | POA: Diagnosis present

## 2023-04-06 DIAGNOSIS — E1142 Type 2 diabetes mellitus with diabetic polyneuropathy: Secondary | ICD-10-CM | POA: Diagnosis present

## 2023-04-06 DIAGNOSIS — N3281 Overactive bladder: Secondary | ICD-10-CM | POA: Diagnosis present

## 2023-04-06 DIAGNOSIS — I2510 Atherosclerotic heart disease of native coronary artery without angina pectoris: Secondary | ICD-10-CM | POA: Diagnosis present

## 2023-04-06 DIAGNOSIS — T8484XA Pain due to internal orthopedic prosthetic devices, implants and grafts, initial encounter: Secondary | ICD-10-CM | POA: Diagnosis present

## 2023-04-06 DIAGNOSIS — Z96652 Presence of left artificial knee joint: Principal | ICD-10-CM

## 2023-04-06 DIAGNOSIS — Z8711 Personal history of peptic ulcer disease: Secondary | ICD-10-CM

## 2023-04-06 DIAGNOSIS — Y831 Surgical operation with implant of artificial internal device as the cause of abnormal reaction of the patient, or of later complication, without mention of misadventure at the time of the procedure: Secondary | ICD-10-CM | POA: Diagnosis present

## 2023-04-06 DIAGNOSIS — I252 Old myocardial infarction: Secondary | ICD-10-CM

## 2023-04-06 DIAGNOSIS — Z880 Allergy status to penicillin: Secondary | ICD-10-CM

## 2023-04-06 DIAGNOSIS — Z823 Family history of stroke: Secondary | ICD-10-CM

## 2023-04-06 DIAGNOSIS — K219 Gastro-esophageal reflux disease without esophagitis: Secondary | ICD-10-CM | POA: Diagnosis present

## 2023-04-06 DIAGNOSIS — G2581 Restless legs syndrome: Secondary | ICD-10-CM | POA: Diagnosis present

## 2023-04-06 DIAGNOSIS — R059 Cough, unspecified: Secondary | ICD-10-CM | POA: Insufficient documentation

## 2023-04-06 DIAGNOSIS — Z9981 Dependence on supplemental oxygen: Secondary | ICD-10-CM

## 2023-04-06 DIAGNOSIS — E875 Hyperkalemia: Secondary | ICD-10-CM | POA: Diagnosis not present

## 2023-04-06 DIAGNOSIS — I129 Hypertensive chronic kidney disease with stage 1 through stage 4 chronic kidney disease, or unspecified chronic kidney disease: Secondary | ICD-10-CM | POA: Diagnosis present

## 2023-04-06 DIAGNOSIS — Z7982 Long term (current) use of aspirin: Secondary | ICD-10-CM

## 2023-04-06 DIAGNOSIS — R4189 Other symptoms and signs involving cognitive functions and awareness: Secondary | ICD-10-CM | POA: Diagnosis present

## 2023-04-06 DIAGNOSIS — Y792 Prosthetic and other implants, materials and accessory orthopedic devices associated with adverse incidents: Secondary | ICD-10-CM | POA: Diagnosis present

## 2023-04-06 DIAGNOSIS — F419 Anxiety disorder, unspecified: Secondary | ICD-10-CM | POA: Diagnosis present

## 2023-04-06 DIAGNOSIS — G43909 Migraine, unspecified, not intractable, without status migrainosus: Secondary | ICD-10-CM | POA: Diagnosis present

## 2023-04-06 DIAGNOSIS — G4733 Obstructive sleep apnea (adult) (pediatric): Secondary | ICD-10-CM | POA: Diagnosis present

## 2023-04-06 DIAGNOSIS — G3183 Dementia with Lewy bodies: Secondary | ICD-10-CM | POA: Diagnosis present

## 2023-04-06 DIAGNOSIS — E1151 Type 2 diabetes mellitus with diabetic peripheral angiopathy without gangrene: Secondary | ICD-10-CM | POA: Diagnosis present

## 2023-04-06 DIAGNOSIS — H9110 Presbycusis, unspecified ear: Secondary | ICD-10-CM | POA: Diagnosis present

## 2023-04-06 DIAGNOSIS — Z91013 Allergy to seafood: Secondary | ICD-10-CM

## 2023-04-06 DIAGNOSIS — E66812 Obesity, class 2: Secondary | ICD-10-CM | POA: Diagnosis present

## 2023-04-06 DIAGNOSIS — Z01812 Encounter for preprocedural laboratory examination: Secondary | ICD-10-CM

## 2023-04-06 DIAGNOSIS — Z6838 Body mass index (BMI) 38.0-38.9, adult: Secondary | ICD-10-CM

## 2023-04-06 DIAGNOSIS — F0283 Dementia in other diseases classified elsewhere, unspecified severity, with mood disturbance: Secondary | ICD-10-CM | POA: Diagnosis present

## 2023-04-06 DIAGNOSIS — Z825 Family history of asthma and other chronic lower respiratory diseases: Secondary | ICD-10-CM

## 2023-04-06 DIAGNOSIS — E119 Type 2 diabetes mellitus without complications: Secondary | ICD-10-CM

## 2023-04-06 DIAGNOSIS — I10 Essential (primary) hypertension: Secondary | ICD-10-CM | POA: Diagnosis present

## 2023-04-06 DIAGNOSIS — I739 Peripheral vascular disease, unspecified: Secondary | ICD-10-CM | POA: Diagnosis present

## 2023-04-06 DIAGNOSIS — Z96651 Presence of right artificial knee joint: Secondary | ICD-10-CM | POA: Diagnosis present

## 2023-04-06 DIAGNOSIS — Z91048 Other nonmedicinal substance allergy status: Secondary | ICD-10-CM

## 2023-04-06 DIAGNOSIS — Z955 Presence of coronary angioplasty implant and graft: Secondary | ICD-10-CM

## 2023-04-06 HISTORY — DX: Other specified health status: Z78.9

## 2023-04-06 HISTORY — DX: Long term (current) use of aspirin: Z79.82

## 2023-04-06 HISTORY — DX: Dementia in other diseases classified elsewhere, unspecified severity, without behavioral disturbance, psychotic disturbance, mood disturbance, and anxiety: F02.80

## 2023-04-06 HISTORY — PX: TOTAL KNEE REVISION: SHX996

## 2023-04-06 HISTORY — DX: Unspecified right bundle-branch block: I45.10

## 2023-04-06 HISTORY — DX: Cyst of kidney, acquired: N28.1

## 2023-04-06 LAB — POCT I-STAT, CHEM 8
BUN: 28 mg/dL — ABNORMAL HIGH (ref 8–23)
Calcium, Ion: 1.26 mmol/L (ref 1.15–1.40)
Chloride: 107 mmol/L (ref 98–111)
Creatinine, Ser: 1.5 mg/dL — ABNORMAL HIGH (ref 0.61–1.24)
Glucose, Bld: 99 mg/dL (ref 70–99)
HCT: 33 % — ABNORMAL LOW (ref 39.0–52.0)
Hemoglobin: 11.2 g/dL — ABNORMAL LOW (ref 13.0–17.0)
Potassium: 5.5 mmol/L — ABNORMAL HIGH (ref 3.5–5.1)
Sodium: 140 mmol/L (ref 135–145)
TCO2: 23 mmol/L (ref 22–32)

## 2023-04-06 LAB — ABO/RH: ABO/RH(D): O POS

## 2023-04-06 LAB — GLUCOSE, CAPILLARY: Glucose-Capillary: 150 mg/dL — ABNORMAL HIGH (ref 70–99)

## 2023-04-06 SURGERY — TOTAL KNEE REVISION
Anesthesia: General | Site: Knee | Laterality: Left

## 2023-04-06 MED ORDER — 0.9 % SODIUM CHLORIDE (POUR BTL) OPTIME
TOPICAL | Status: DC | PRN
  Administered 2023-04-06: 1000 mL

## 2023-04-06 MED ORDER — LIDOCAINE HCL (CARDIAC) PF 100 MG/5ML IV SOSY
PREFILLED_SYRINGE | INTRAVENOUS | Status: DC | PRN
  Administered 2023-04-06: 100 mg via INTRAVENOUS

## 2023-04-06 MED ORDER — METHYLENE BLUE (ANTIDOTE) 1 % IV SOLN
INTRAVENOUS | Status: AC
  Filled 2023-04-06: qty 10

## 2023-04-06 MED ORDER — GLYCOPYRROLATE 0.2 MG/ML IJ SOLN
INTRAMUSCULAR | Status: DC | PRN
  Administered 2023-04-06: .2 mg via INTRAVENOUS

## 2023-04-06 MED ORDER — ONDANSETRON HCL 4 MG/2ML IJ SOLN
INTRAMUSCULAR | Status: DC | PRN
  Administered 2023-04-06: 4 mg via INTRAVENOUS

## 2023-04-06 MED ORDER — ALBUTEROL SULFATE HFA 108 (90 BASE) MCG/ACT IN AERS
INHALATION_SPRAY | RESPIRATORY_TRACT | Status: DC | PRN
  Administered 2023-04-06: 4 via RESPIRATORY_TRACT

## 2023-04-06 MED ORDER — ACETAMINOPHEN 10 MG/ML IV SOLN
INTRAVENOUS | Status: AC
  Filled 2023-04-06: qty 100

## 2023-04-06 MED ORDER — DEXAMETHASONE SODIUM PHOSPHATE 10 MG/ML IJ SOLN
8.0000 mg | Freq: Once | INTRAMUSCULAR | Status: AC
  Administered 2023-04-06: 10 mg via INTRAVENOUS

## 2023-04-06 MED ORDER — ORAL CARE MOUTH RINSE: 15.0000 mL | Freq: Once | OROMUCOSAL | Status: AC

## 2023-04-06 MED ORDER — PROPOFOL 1000 MG/100ML IV EMUL
INTRAVENOUS | Status: AC
  Filled 2023-04-06: qty 100

## 2023-04-06 MED ORDER — FENTANYL CITRATE (PF) 100 MCG/2ML IJ SOLN
INTRAMUSCULAR | Status: AC
  Filled 2023-04-06: qty 2

## 2023-04-06 MED ORDER — OXYCODONE HCL 5 MG PO TABS
5.0000 mg | ORAL_TABLET | Freq: Once | ORAL | Status: AC | PRN
  Administered 2023-04-06: 5 mg via ORAL

## 2023-04-06 MED ORDER — OXYCODONE HCL 5 MG PO TABS
ORAL_TABLET | ORAL | Status: AC
  Filled 2023-04-06: qty 1

## 2023-04-06 MED ORDER — OXYCODONE HCL 5 MG/5ML PO SOLN: 5.0000 mg | Freq: Once | ORAL | Status: AC | PRN

## 2023-04-06 MED ORDER — HYDROMORPHONE HCL 1 MG/ML IJ SOLN
INTRAMUSCULAR | Status: AC
  Filled 2023-04-06: qty 1

## 2023-04-06 MED ORDER — ACETAMINOPHEN 10 MG/ML IV SOLN
INTRAVENOUS | Status: DC | PRN
  Administered 2023-04-06: 1000 mg via INTRAVENOUS

## 2023-04-06 MED ORDER — ALBUTEROL SULFATE HFA 108 (90 BASE) MCG/ACT IN AERS
INHALATION_SPRAY | RESPIRATORY_TRACT | Status: AC
  Filled 2023-04-06: qty 6.7

## 2023-04-06 MED ORDER — SODIUM CHLORIDE 0.9 % IV SOLN: INTRAVENOUS | Status: DC

## 2023-04-06 MED ORDER — HYDROMORPHONE HCL 1 MG/ML IJ SOLN
0.5000 mg | INTRAMUSCULAR | Status: AC | PRN
Start: 2023-04-06 — End: 2023-04-06
  Administered 2023-04-06 (×4): 0.5 mg via INTRAVENOUS

## 2023-04-06 MED ORDER — SODIUM CHLORIDE (PF) 0.9 % IJ SOLN
INTRAMUSCULAR | Status: DC | PRN
  Administered 2023-04-06: 70 mL

## 2023-04-06 MED ORDER — PROPOFOL 500 MG/50ML IV EMUL
INTRAVENOUS | Status: DC | PRN
  Administered 2023-04-06: 100 ug/kg/min via INTRAVENOUS
  Administered 2023-04-06: 150 mg via INTRAVENOUS

## 2023-04-06 MED ORDER — KETAMINE HCL 50 MG/5ML IJ SOSY
PREFILLED_SYRINGE | INTRAMUSCULAR | Status: AC
  Filled 2023-04-06: qty 5

## 2023-04-06 MED ORDER — CHLORHEXIDINE GLUCONATE 0.12 % MT SOLN
15.0000 mL | Freq: Once | OROMUCOSAL | Status: AC
  Administered 2023-04-06: 15 mL via OROMUCOSAL

## 2023-04-06 MED ORDER — BUPIVACAINE LIPOSOME 1.3 % IJ SUSP
INTRAMUSCULAR | Status: AC
  Filled 2023-04-06: qty 20

## 2023-04-06 MED ORDER — PROPOFOL 10 MG/ML IV BOLUS
INTRAVENOUS | Status: DC | PRN
  Administered 2023-04-06: 30 mg via INTRAVENOUS

## 2023-04-06 MED ORDER — SODIUM CHLORIDE FLUSH 0.9 % IV SOLN
INTRAVENOUS | Status: AC
  Filled 2023-04-06: qty 20

## 2023-04-06 MED ORDER — METHYLENE BLUE (ANTIDOTE) 1 % IV SOLN
INTRAVENOUS | Status: DC | PRN
  Administered 2023-04-06: 1 mL

## 2023-04-06 MED ORDER — MIDAZOLAM HCL 2 MG/2ML IJ SOLN
INTRAMUSCULAR | Status: AC
  Filled 2023-04-06: qty 2

## 2023-04-06 MED ORDER — PHENYLEPHRINE HCL-NACL 20-0.9 MG/250ML-% IV SOLN
INTRAVENOUS | Status: DC | PRN
  Administered 2023-04-06: 50 ug/min via INTRAVENOUS

## 2023-04-06 MED ORDER — CHLORHEXIDINE GLUCONATE 0.12 % MT SOLN
OROMUCOSAL | Status: AC
  Filled 2023-04-06: qty 15

## 2023-04-06 MED ORDER — MIDAZOLAM HCL 2 MG/2ML IJ SOLN
INTRAMUSCULAR | Status: DC | PRN
  Administered 2023-04-06: 2 mg via INTRAVENOUS

## 2023-04-06 MED ORDER — SODIUM CHLORIDE 0.9 % IR SOLN
Status: DC | PRN
  Administered 2023-04-06: 3000 mL

## 2023-04-06 MED ORDER — BUPIVACAINE-EPINEPHRINE (PF) 0.25% -1:200000 IJ SOLN
INTRAMUSCULAR | Status: AC
  Filled 2023-04-06: qty 30

## 2023-04-06 MED ORDER — CEFAZOLIN IN SODIUM CHLORIDE 3-0.9 GM/100ML-% IV SOLN
3.0000 g | INTRAVENOUS | Status: AC
  Administered 2023-04-06: 3 g via INTRAVENOUS
  Filled 2023-04-06 (×2): qty 100

## 2023-04-06 MED ORDER — FENTANYL CITRATE (PF) 100 MCG/2ML IJ SOLN
INTRAMUSCULAR | Status: DC | PRN
  Administered 2023-04-06 (×3): 50 ug via INTRAVENOUS

## 2023-04-06 MED ORDER — OXYCODONE HCL 5 MG PO TABS
5.0000 mg | ORAL_TABLET | Freq: Once | ORAL | Status: DC
Start: 2023-04-06 — End: 2023-04-06

## 2023-04-06 MED ORDER — FENTANYL CITRATE (PF) 100 MCG/2ML IJ SOLN
25.0000 ug | INTRAMUSCULAR | Status: DC | PRN
  Administered 2023-04-06 (×4): 25 ug via INTRAVENOUS

## 2023-04-06 MED ORDER — TRANEXAMIC ACID-NACL 1000-0.7 MG/100ML-% IV SOLN
INTRAVENOUS | Status: AC
  Filled 2023-04-06: qty 100

## 2023-04-06 MED ORDER — TRANEXAMIC ACID-NACL 1000-0.7 MG/100ML-% IV SOLN
1000.0000 mg | INTRAVENOUS | Status: AC
  Administered 2023-04-06 (×2): 1000 mg via INTRAVENOUS

## 2023-04-06 MED ORDER — SURGIPHOR WOUND IRRIGATION SYSTEM - OPTIME: TOPICAL | Status: DC | PRN

## 2023-04-06 MED ORDER — ROCURONIUM BROMIDE 10 MG/ML (PF) SYRINGE
PREFILLED_SYRINGE | INTRAVENOUS | Status: AC
  Filled 2023-04-06: qty 10

## 2023-04-06 MED ORDER — KETAMINE HCL 50 MG/5ML IJ SOSY
PREFILLED_SYRINGE | INTRAMUSCULAR | Status: DC | PRN
  Administered 2023-04-06 (×3): 10 mg via INTRAVENOUS
  Administered 2023-04-06: 20 mg via INTRAVENOUS

## 2023-04-06 MED ORDER — SUGAMMADEX SODIUM 200 MG/2ML IV SOLN
INTRAVENOUS | Status: DC | PRN
  Administered 2023-04-06: 200 mg via INTRAVENOUS

## 2023-04-06 MED ORDER — ROCURONIUM BROMIDE 100 MG/10ML IV SOLN
INTRAVENOUS | Status: DC | PRN
  Administered 2023-04-06: 70 mg via INTRAVENOUS

## 2023-04-06 SURGICAL SUPPLY — 88 items
ADH SKN CLS APL DERMABOND .7 (GAUZE/BANDAGES/DRESSINGS) ×1
APL PRP STRL LF DISP 70% ISPRP (MISCELLANEOUS) ×2
AUG FEM 9 9+ 5 STRL LF KN POST (Miscellaneous) ×2 IMPLANT
AUG TIB AB CONE CNTR STRL LF (Insert) ×1 IMPLANT
BLADE FLEX CHISEL 8 2.5 (MISCELLANEOUS) IMPLANT
BLADE OSCILLATING/SAGITTAL (BLADE) ×1
BLADE SAGITTAL WIDE XTHICK NO (BLADE) IMPLANT
BLADE SAW 90X13X1.19 OSCILLAT (BLADE) IMPLANT
BLADE SAW GIGLI 510 (BLADE) IMPLANT
BLADE SAW SAG 25X90X1.19 (BLADE) IMPLANT
BLADE SW THK.38XMED LNG THN (BLADE) IMPLANT
BOWL CEMENT MIX W/ADAPTER (MISCELLANEOUS) ×1 IMPLANT
CEMENT BONE R 1X40 (Cement) IMPLANT
CHLORAPREP W/TINT 26 (MISCELLANEOUS) ×2 IMPLANT
CNTNR URN SCR LID CUP LEK RST (MISCELLANEOUS) IMPLANT
COMP FEM TI MET KNEE 9 (Knees) ×1 IMPLANT
COMPONENT FEM TI MET KNEE 9 (Knees) IMPLANT
COMPONENT PSN TIB FIX SZ F LT (Miscellaneous) IMPLANT
CONT SPEC 4OZ STRL OR WHT (MISCELLANEOUS)
COOLER POLAR GLACIER W/PUMP (MISCELLANEOUS) ×1 IMPLANT
CUFF TOURN SGL QUICK 24 (TOURNIQUET CUFF)
CUFF TOURN SGL QUICK 30 (TOURNIQUET CUFF) ×1
CUFF TRNQT CYL 24X4X16.5-23 (TOURNIQUET CUFF) IMPLANT
CUFF TRNQT CYL 30X4X21-28X (TOURNIQUET CUFF) IMPLANT
DERMABOND ADVANCED .7 DNX12 (GAUZE/BANDAGES/DRESSINGS) IMPLANT
DRAPE SHEET LG 3/4 BI-LAMINATE (DRAPES) ×1 IMPLANT
DRSG MEPILEX SACRM 8.7X9.8 (GAUZE/BANDAGES/DRESSINGS) ×1 IMPLANT
DRSG OPSITE POSTOP 4X10 (GAUZE/BANDAGES/DRESSINGS) IMPLANT
DRSG OPSITE POSTOP 4X8 (GAUZE/BANDAGES/DRESSINGS) IMPLANT
ELECT REM PT RETURN 9FT ADLT (ELECTROSURGICAL) ×1
ELECTRODE REM PT RTRN 9FT ADLT (ELECTROSURGICAL) ×1 IMPLANT
EVACUATOR 1/8 PVC DRAIN (DRAIN) IMPLANT
GLOVE BIO SURGEON STRL SZ8 (GLOVE) ×1 IMPLANT
GLOVE BIOGEL PI IND STRL 8 (GLOVE) ×1 IMPLANT
GLOVE PI ORTHO PRO STRL 7.5 (GLOVE) ×2 IMPLANT
GLOVE PI ORTHO PRO STRL SZ8 (GLOVE) ×2 IMPLANT
GLOVE SURG SYN 7.5 E (GLOVE) ×1 IMPLANT
GLOVE SURG SYN 7.5 PF PI (GLOVE) ×1 IMPLANT
GOWN SRG XL LVL 3 NONREINFORCE (GOWNS) ×1 IMPLANT
GOWN STRL NON-REIN TWL XL LVL3 (GOWNS) ×1
GOWN STRL REUS W/ TWL LRG LVL3 (GOWN DISPOSABLE) ×1 IMPLANT
GOWN STRL REUS W/ TWL XL LVL3 (GOWN DISPOSABLE) ×1 IMPLANT
GOWN STRL REUS W/TWL LRG LVL3 (GOWN DISPOSABLE) ×1
GOWN STRL REUS W/TWL XL LVL3 (GOWN DISPOSABLE) ×1
HANDLE YANKAUER SUCT OPEN TIP (MISCELLANEOUS) ×1 IMPLANT
HOLDER FOLEY CATH W/STRAP (MISCELLANEOUS) ×1 IMPLANT
HOOD PEEL AWAY T7 (MISCELLANEOUS) ×2 IMPLANT
INSERT ASF VE PS 14 6-9/EF LT (Insert) IMPLANT
INSERT TIB PS CMTLS CC FX (Insert) IMPLANT
IV NS IRRIG 3000ML ARTHROMATIC (IV SOLUTION) ×1 IMPLANT
KIT TURNOVER KIT A (KITS) ×1 IMPLANT
MANIFOLD NEPTUNE II (INSTRUMENTS) ×1 IMPLANT
MARKER SKIN DUAL TIP RULER LAB (MISCELLANEOUS) ×1 IMPLANT
MAT ABSORB FLUID 56X50 GRAY (MISCELLANEOUS) ×1 IMPLANT
NDL FILTER BLUNT 18X1 1/2 (NEEDLE) IMPLANT
NDL HYPO 21X1.5 SAFETY (NEEDLE) IMPLANT
NDL SAFETY ECLIPSE 18X1.5 (NEEDLE) IMPLANT
NEEDLE FILTER BLUNT 18X1 1/2 (NEEDLE) IMPLANT
NEEDLE HYPO 21X1.5 SAFETY (NEEDLE) IMPLANT
NS IRRIG 500ML POUR BTL (IV SOLUTION) ×1 IMPLANT
OSTEOTOME THIN 10 5 (MISCELLANEOUS) IMPLANT
OSTEOTOME THIN 10.0 1.5 (INSTRUMENTS) IMPLANT
PACK TOTAL KNEE (MISCELLANEOUS) ×1 IMPLANT
PAD ARMBOARD 7.5X6 YLW CONV (MISCELLANEOUS) ×3 IMPLANT
PAD WRAPON POLAR KNEE (MISCELLANEOUS) ×1 IMPLANT
PENCIL SMOKE EVACUATOR (MISCELLANEOUS) ×1 IMPLANT
PENCIL SMOKE EVACUATOR COATED (MISCELLANEOUS) IMPLANT
PIN DRILL HDLS TROCAR 75 4PK (PIN) IMPLANT
PULSAVAC PLUS IRRIG FAN TIP (DISPOSABLE) ×1
SOLUTION IRRIG SURGIPHOR (IV SOLUTION) ×1 IMPLANT
STEM STRT SPLINE EXT PS 18X135 (Stem) IMPLANT
STEM STRT SPLINE PS 17X135 (Stem) IMPLANT
SUT DVC 2 QUILL PDO T11 36X36 (SUTURE) IMPLANT
SUT QUILL MONODERM 3-0 PS-2 (SUTURE) IMPLANT
SUT VIC AB 0 CT1 36 (SUTURE) IMPLANT
SUT VIC AB 1 CT1 36 (SUTURE) IMPLANT
SUT VIC AB 2-0 CT2 27 (SUTURE) IMPLANT
SWAB CULTURE AMIES ANAERIB BLU (MISCELLANEOUS) IMPLANT
SYR 30ML LL (SYRINGE) IMPLANT
SYR 3ML LL SCALE MARK (SYRINGE) IMPLANT
TAPE CLOTH 3X10 WHT NS LF (GAUZE/BANDAGES/DRESSINGS) ×1 IMPLANT
TIP FAN IRRIG PULSAVAC PLUS (DISPOSABLE) ×1 IMPLANT
TOWEL OR 17X26 4PK STRL BLUE (TOWEL DISPOSABLE) IMPLANT
TRAP FLUID SMOKE EVACUATOR (MISCELLANEOUS) ×1 IMPLANT
TRAY FOLEY SLVR 16FR LF STAT (SET/KITS/TRAYS/PACK) ×1 IMPLANT
WATER STERILE IRR 1000ML POUR (IV SOLUTION) ×1 IMPLANT
WEDGE FEM F/ARTHRO 9X5 (Miscellaneous) IMPLANT
WRAPON POLAR PAD KNEE (MISCELLANEOUS) ×1

## 2023-04-06 NOTE — Transfer of Care (Signed)
Immediate Anesthesia Transfer of Care Note  Patient: Brian Kramer  Procedure(s) Performed: Left total knee revision, both components (Left: Knee)  Patient Location: PACU  Anesthesia Type:General  Level of Consciousness: awake, patient cooperative, and responds to stimulation  Airway & Oxygen Therapy: Patient Spontanous Breathing and Patient connected to face mask oxygen  Post-op Assessment: Report given to RN and Post -op Vital signs reviewed and stable  Post vital signs: Reviewed and stable  Last Vitals:  Vitals Value Taken Time  BP 152/78 04/06/23 1704  Temp    Pulse 89 04/06/23 1708  Resp 24 04/06/23 1708  SpO2 59 % 04/06/23 1708  Vitals shown include unfiled device data.  Last Pain:  Vitals:   04/06/23 1113  TempSrc: Temporal  PainSc: 4          Complications: No notable events documented.

## 2023-04-06 NOTE — Anesthesia Postprocedure Evaluation (Signed)
Anesthesia Post Note  Patient: Brian Kramer  Procedure(s) Performed: Left total knee revision, both components (Left: Knee)  Patient location during evaluation: PACU Anesthesia Type: General Level of consciousness: awake and alert Pain management: pain level controlled Vital Signs Assessment: post-procedure vital signs reviewed and stable Respiratory status: spontaneous breathing, nonlabored ventilation, respiratory function stable and patient connected to nasal cannula oxygen Cardiovascular status: blood pressure returned to baseline and stable Postop Assessment: no apparent nausea or vomiting Anesthetic complications: no   No notable events documented.   Last Vitals:  Vitals:   04/06/23 1830 04/06/23 1907  BP: (!) 160/70 90/60  Pulse: 99 97  Resp: 13 (!) 8  Temp: 37.2 C 36.7 C  SpO2: 96% 96%    Last Pain:  Vitals:   04/06/23 1907  TempSrc: Oral  PainSc:                  [name]

## 2023-04-06 NOTE — Anesthesia Procedure Notes (Signed)
Procedure Name: Intubation Date/Time: 04/06/2023 1:46 PM  Performed by: Maryla Morrow., CRNAPre-anesthesia Checklist: Patient identified, Patient being monitored, Timeout performed, Emergency Drugs available and Suction available Patient Re-evaluated:Patient Re-evaluated prior to induction Oxygen Delivery Method: Circle system utilized Preoxygenation: Pre-oxygenation with 100% oxygen Induction Type: IV induction Ventilation: Mask ventilation without difficulty Laryngoscope Size: McGraph and 4 Grade View: Grade I Tube type: Oral Tube size: 7.5 mm Number of attempts: 1 Airway Equipment and Method: Stylet Placement Confirmation: ETT inserted through vocal cords under direct vision, positive ETCO2 and breath sounds checked- equal and bilateral Secured at: 21 cm Tube secured with: Tape Dental Injury: Teeth and Oropharynx as per pre-operative assessment  Comments: Filled cheeks with gauze to help mask pt. Two person mask

## 2023-04-06 NOTE — Op Note (Signed)
Patient Name: Brian Kramer  ZOX:[id number]  Pre-Operative Diagnosis: Painful left total knee replacement, loose left tibial component of total knee replacement,  Post-Operative Diagnosis: Painful left total knee replacement, loose left tibial component of total knee replacement, flexion and mid flexion instability knee  Procedure: Revision left total knee replacement both components  Components/Implants: Femur PRK 9+ w/ 135x58mm splined stem and 5mm posterior augments x2   Tibia PRK F w/ fixed tibia central cone and 135x33mm splined stem  Poly 14mm CPS  Patella unchanged  Date of Surgery: 04/06/2023  Surgeon: Reinaldo Berber MD  Assistant: Amador Cunas PA (present and scrubbed throughout the case, critical for assistance with exposure, retraction, instrumentation, and closure), Minor PAS   Anesthesiologist: Lorette Ang  Anesthesia: General   Tourniquet Time: 121 minutes  EBL: 100cc  IVF: 700cc  Complications: None Brief history: The patient is a 77 year old male with a history of left total knee replacement. The patient had increasing pain ongoing in their knee ever since  was worked up for painful total knee .  The patient was found to have loosening of the tibial component of their knee confirmed on three-phase bone scan.  Their knee underwent an aspiration and blood work-up which was negative for infection .  Despite multiple attempts at conservative management the patient had increasing pain and functional disability .   The risks and benefits of revision total knee arthroplasty as definitive surgical treatment were discussed with the patient, who opted to proceed with the operation.  All preoperative films were reviewed and an appropriate surgical plan was made prior to surgery. Preoperative range of motion was 0 to 115.   Description of procedure: The patient was brought to the operating room where laterality was confirmed by all those present to be the left side.    Spinal  anesthesia was administered and the patient received an intravenous dose of antibiotics for surgical prophylaxis and a dose of tranexamic acid.  Patient is positioned supine on the operating room table with all bony prominences well-padded.  A well-padded tourniquet was applied to the left thigh.  The knee was then prepped and draped in usual sterile fashion with multiple layers of adhesive and nonadhesive drapes.  All of those present in the operating room participated in a surgical timeout laterality and patient were confirmed.  After induction of anesthesia the knee was reexamined and found to have significant mid flexion and flexion instability with more than 10 mm of anterior drawer at 90 degrees of flexion.   An Esmarch was wrapped around the extremity and the leg was elevated and the knee flexed.  The tourniquet was inflated to a pressure of 275 mmHg. The Esmarch was removed and the leg was brought down to full extension.  The patella and tibial tubercle identified as well as the previous surgical incision and outlined using a marking pen and a full-thickness incision was made on the midline in continuation with the prior incision with a knife carried through the subcutaneous tissue down to the extensor retinaculum.  After exposure of the extensor mechanism the medial parapatellar arthrotomy was performed with a scalpel and electrocautery extending down medial and distal to the tibial tubercle taking care to avoid incising the patellar tendon.    A standard medial release was performed over the proximal tibia exposing the existing tibial baseplate and polyethylene.  The knee was brought into extension in order to excise the scar tissue around the patellar tendon with care taken to avoid injuring the patellar tendon .  Synovial fluid was found to be normal in appearance with no signs of infection.  A piece of synovium was removed and sent to pathology for evaluation under frozen section and found to have  no signs of inflammation.   Scar excision and synovectomy was performed in the medial and lateral gutters to aid in visualization and exposure of the components.  The interface around the femoral component and the tibial component and bone were cleared using electrocautery and rongeurs.  The interface between the femoral component and the bone was worked with microsagittal saw, a flexible osteotomes and a Gigli saw.  The chamfer interfaces were exposed and worked with a flexible osteotome.  After circumferential release of the interface between the bone and the femoral component the femoral component was tamped off with minimal bone loss.   Attention was then turned to the tibial component the interface between the tibial component the bone was worked with an oscillating saw and flexible osteotomes.  The component was then removed with an extraction tool.  There was found to be no bone loss with complete debonding of the implant around the I-beam.  This was consistent with loosening around the central portion of the tibial component.     Both the tibial and femoral canals were accessed at this time and were sequentially reamed up by hand reaching a size 17 mm reamer on the tibia with good fit and fill and a size 18 mm reamer on the femur with good fit and fill.  The tibia was then aligned with a cutting jig and a freshen up cut was made through the 0 hole.  Tibial baseplate size F was appropriately sized and fit well over the tibia with a right stem.  The tibia was also prepared at this time for a central cone due to central bone loss it was reamed and a fixed central cone trial was placed.  A full tibial trial was then placed and attention was turned back to the femur. A size 9 trial femur was put in place with freshen up cuts through the cut slots as needed specifically with overhang off the posterior condyles requiring a 5 mm posterior augment medially and laterally. The 9+ size trial femur was then placed  which provided for good posterior offset.  The box was opened with an oscillating saw and the trochlear component was placed.  A trial polyethylene was placed and the knee was taken through range of motion.  The knee was found to have good range of motion with full extension and flexion to 120 degrees with good stability through range of motion with a CPS trial component.   The patella was evaluated and found to have good fixation to bone with appropriate size and minimal scar overgrowth which was excised with cautery.  All the trial components were removed and placed on the back table to use with assembling the actual implants.   The correct final components for implantation were confirmed and opened by the circulator nurse.  The prepared surfaces of the  femur and tibia were cleaned with pulsatile lavage to remove all blood fat and other material and then the surfaces were dried.  2 bags of cement were mixed under vacuum and the components were cemented into place.  Excess cement was removed with curettes and forceps.  The final polyethylene tibial component was implanted and the knee was brought into full extension to allow the cement to set.  At this time the periarticular injection cocktail was  placed in the soft tissues surrounding the knee.  The knee was then irrigated with copious amount of normal saline via pulsatile lavage to remove all loose bodies and other debris.  The knee was then irrigated with surgiphor betadine based wash and reirrigated with saline.  The tourniquet was then dropped and all bleeding vessels were identified and coagulated.  The arthrotomy was approximated with #1 Vicryl and closed with #2 Quill suture.  The knee was brought into slight flexion and the subcutaneous tissues were closed with 0 Vicryl, 2-0 Vicryl and a running subcuticular 3-0 barbed monocryl based suture.  Skin was then glued with Dermabond.  A sterile adhesive dressing was then placed along with a sequential  compression device to the calf, a Ted stocking, and a cryotherapy cuff.     Sponge, needle, and Lap counts were all correct at the end of the case.   The patient was transferred off of the operating room table to a hospital bed, good pulses were found distally on the operative side.  The patient was transferred to the recovery room in stable condition.

## 2023-04-06 NOTE — Interval H&P Note (Signed)
Patient history and physical updated. Consent reviewed including risks, benefits, and alternatives to surgery. Patient agrees with above plan to proceed with revision left total knee arthroplasty. Plan to keep the patella if looks good and replace the tibia and femur components.

## 2023-04-06 NOTE — H&P (Signed)
History of Present Illness: The patient is an 77 y.o. male seen in clinic today for history and physical for left total knee revision with Dr. Audelia Acton on 04/06/2023. Patient had his left total knee replaced in 2004-2006 and his knee was doing well for years over the last 6-10 months he has had sharp stabs of pain over his anterior tibia that are severe and debilitating and increasing. The pains come on randomly and are not associated with any injuries or falls and can happen even without any motion. He has tried hinged knee bracing and home physical therapy with minimal improvement in pain or function. He is not having any ill episodes of instability or giving out of the knee but does report persistent pain up to a 4 or 5 out of 10. He reports at this point that the knee is functionally limiting him with these sharp stabbing pains. The patient denies fevers, chills, numbness, tingling, shortness of breath, chest pain, recent illness, or any trauma.  Patient has had a bone scan showing loosening of the components. He is also had negative aspiration of synovial fluid showing no culture growth and normal WBCs.  Patient is a non-smoker but is oxygen dependent due to pulmonary hypertension on 2 L nasal cannula at night. He is a nondiabetic well-controlled and his last A1c check. He has a BMI of 38.4.  Past Medical History: Past Medical History:  Diagnosis Date  Allergic rhinitis due to allergen  Allergy  Penicillin,shellfish,some adhesives  CKD (chronic kidney disease) stage 3, GFR 30-59 ml/min (CMS/HHS-HCC)  Cognitive impairment on donepezil - followed by Dr. Sherryll Burger 02/28/2021  COPD, moderate (CMS-HCC) - followed by Dr. Meredeth Ide 06/23/2022  Coronary atherosclerosis  Diabetes mellitus type 2, uncomplicated (CMS/HHS-HCC)  GERD (gastroesophageal reflux disease)  History of cataract  History of MI (myocardial infarction) 03/1995  History of myocardial infarction 03/1995  Hyperlipidemia  Insomnia   Migraine headache  Moderate major depression (CMS/HHS-HCC)  Neuropathy  Obesity  OSA (obstructive sleep apnea)  Osteoarthrosis involving lower leg  Overactive bladder  Presbycusis  PUD (peptic ulcer disease)  RLS (restless legs syndrome)  Thrombocytopenia (CMS-HCC) 02/28/2021  Tremor on keppra - followed by Dr. Sherryll Burger 02/28/2021   Past Surgical History: Past Surgical History:  Procedure Laterality Date  HERNIA REPAIR Bilateral 2004  COLONOSCOPY 10/28/2012  Dr. Jeanne Ivan @ [facility] - Tubular Adenoma  EGD 01/28/2016  DR. J. 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 Rosita Kea  COLONOSCOPY 05/19/2018  Diverticulosis/PHx CP/NO repeat due to age/TKT  CARPOMETACARPAL ([facility]) FUSION OF LEFT THUMB, DIABETIC (Left) Left 12/02/2018  Dr. Rosita Kea  Left elbow ulnar nerve release Left 04/14/2019  Dr. Rosita Kea  Bilateral knee replacement surgeries  CATARACT EXTRACTION Bilateral  Right cataract- 11/23/13; left cataract- 12/21/13  CORONARY ANGIOPLASTY WITH STENT PLACEMENT  JOINT REPLACEMENT  Knees  KNEE ARTHROSCOPY  Right foot tendon repair  Right heel spur repair  Right shoulder surgery  Stent x 2- RCA  TONSILLECTOMY   Past Family History: Family History  Problem Relation Age of Onset  No Known Problems Mother  Stroke Father  Asthma Daughter   Medications: Current Outpatient Medications  Medication Sig Dispense Refill  albuterol MDI, PROVENTIL, VENTOLIN, PROAIR, HFA 90 mcg/actuation inhaler INHALE 2 INHALATIONS INTO THE LUNGS EVERY 6 HOURS AS NEEDED FOR WHEEZING 18 g 5  ambrisentan (LETAIRIS) 5 MG tablet TAKE 1 TABLET (5MG ) BY MOUTH ONCE  DAILY 30 tablet 5  aspirin 81 MG EC tablet Take 81 mg by mouth every evening  azelastine (ASTELIN) 137 mcg nasal spray Place 2 sprays into both nostrils 2 (two) times daily as needed  calcium carbonate 500 mg  calcium (1,250 mg) chewable tablet Take 1 tablet by mouth 2 (two) times daily With Vitamin D  DULoxetine (CYMBALTA) 30 MG DR capsule Take 3 capsules (90 mg total) by mouth once daily 270 capsule 1  ferrous sulfate 325 (65 FE) MG EC tablet Take 325 mg by mouth daily with breakfast 60 tablet 11  fluticasone-umeclidinium-vilanterol (TRELEGY ELLIPTA) 100-62.5-25 mcg inhaler Inhale 1 Puff into the lungs once daily 60 each 2  ipratropium (ATROVENT) 0.03 % nasal spray Place 2 sprays into both nostrils 3 (three) times daily as needed for Rhinitis  lisinopriL (ZESTRIL) 2.5 MG tablet TAKE 1 TABLET(2.5 MG) BY MOUTH DAILY 30 tablet 3  mometasone (ELOCON) 0.1 % lotion INSTILL 4 DROPS INTO EACH EAR EVERY NIGHT AT BEDTIME AS NEEDED FOR DRY SKIN AND ITCHING  multivitamin capsule Take 1 capsule by mouth once daily Bariatic multivitamin with iron- 1 tablet by mouth once daily  multivitamin with minerals Cap Take 1 capsule by mouth once daily  mupirocin (BACTROBAN) 2 % ointment APPLY WITH QTIP TWICE DAILY AS NEEDED FOR NASAL DRYNESS AND INFLAMMATION AS DIRECTED  nitroGLYcerin (NITROSTAT) 0.4 MG SL tablet Place 1 tablet (0.4 mg total) under the tongue every 5 (five) minutes as needed for Chest pain May take up to 3 doses. 25 tablet 5  oxyBUTYnin (DITROPAN-XL) 5 MG XL tablet TAKE 1 TABLET(5 MG) BY MOUTH DAILY 30 tablet 3  pregabalin (LYRICA) 200 MG capsule Take 1 capsule (200 mg total) by mouth 3 (three) times daily for 30 days 90 capsule 5  QUEtiapine (SEROQUEL) 25 MG tablet Take 2 tablets (50 mg total) by mouth at bedtime 180 tablet 1  rOPINIRole (REQUIP XL) 4 MG XL tablet TAKE 1 TABLET(4 MG) BY MOUTH AT BEDTIME 90 tablet 3  rosuvastatin (CRESTOR) 5 MG tablet TAKE 1 TABLET(5 MG) BY MOUTH EVERY DAY 90 tablet 3   No current facility-administered medications for this visit.   Allergies: Allergies  Allergen Reactions  Adhesive Other (See Comments)  Round, white EKG pads only - "ate holes in my skin" 1997 when stays  on skin for longer period  Penicillins Hives and Rash  Shellfish Containing Products Hives and Rash  PT CAN NOT EAT CRAB BUT CAN EAT SHELLFISH.    Review of Systems:  A comprehensive 14 point ROS was performed, reviewed, and the pertinent orthopaedic findings are documented in the HPI.  Physical Exam: General:  Well developed, well nourished, no apparent distress, normal affect, presents in a wheelchair  HEENT: Head normocephalic, atraumatic, PERRL.   Abdomen: Soft, non tender, non distended, Bowel sounds present.  Heart: Examination of the heart reveals regular, rate, and rhythm. There is no murmur noted on ascultation. There is a normal apical pulse.  Lungs: Lungs are clear to auscultation. There is no wheeze, rhonchi, or crackles. There is normal expansion of bilateral chest walls.   Comprehensive Knee Exam: Gait Antalgic  Alignment Neutral   Inspection Left  Skin Normal appearance with no obvious deformity. No ecchymosis or erythema. Healed midline incision  Soft Tissue No focal soft tissue swelling  Quad Atrophy Mild   Palpation  Left  Tenderness No peripatellar, patellar tendon, quad tendon, medial/lateral joint line pain. Tenderness over the proximal medial tibia partially reproducible today.  Crepitus No patellofemoral  or tibiofemoral crepitus  Effusion None   Range of Motion Left  Flexion 0-115  Extension Full knee extension without hyperextension   Ligamentous Exam Left  AP motion in flexion 5-1mm  Varus/Valgus 0 1-22mm  Varus/Valgus 90 1-62mm    Neurovascular Left  Quadriceps Strength 5/5  Hamstring Strength 5/5  Hip Abductor Strength 4/5  Distal Motor Normal  Distal Sensory Normal light touch sensation  Distal Pulses Normal    Imaging Studies: I have reviewed AP, lateral, and sunrise weight bearing knee X-rays (3 views) of the left knee from 10/29/2022 patient is s/p total knee arthroplasty with CR style components with an anterior locking  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 There is a cemented replacement with a patellar resurfacing. No obvious periprosthetic loosening or component motion noted. There is a very small amount of lucency behind the femoral component and under the anterior flange. Otherwise the components appear well-fixed and the patella is tracking in the midline. Patient is also status post right total knee replacement with similar components to the left which appear in good position without any evidence of periprosthetic fracture or loosening.   Three-phase nuclear medicine bone scan performed on 02/04/2023 images reviewed by myself shows asymmetric uptake around the left tibial plateau concerning for tibial component loosening. Agree with radiologist interpretation.  CLINICAL DATA: Status post left knee arthroplasty in 2006 and right  knee arthroplasty in 2005. Complains of left knee pain for 6 months.  Denies trauma.   EXAM:  NUCLEAR MEDICINE 3-PHASE BONE SCAN   TECHNIQUE:  Radionuclide angiographic images, immediate static blood pool  images, and 3-hour delayed static images were obtained of the knees  after intravenous injection of radiopharmaceutical.   RADIOPHARMACEUTICALS: 22.8 mCi Tc-71m MDP IV   COMPARISON: None Available.   FINDINGS:  Vascular phase: No abnormal asymmetric increased uptake identified.   Blood pool phase: No abnormal asymmetric increased blood pool uptake  identified.   Delayed phase: There is mildly asymmetric increased uptake  localizing to the lateral tibial plateau of the left knee as well as  the stem of the tibial component. Bilateral and symmetric increased  uptake localizing to both patella.   IMPRESSION:  1. Mild asymmetric increased uptake localizes to the lateral tibial  plateau and around the stem of the tibial component of the left knee  on the delayed phase images. Findings may reflect loosening around  the tibial component of the left knee arthroplasty  device.  2. Increased uptake localizes to both patella on the delayed phase  images which may be seen in the setting of patellofemoral joint  dysfunction.   Electronically Signed  By: Signa Kell M.D.  On: 02/14/2023 10:11   Assessment:  Painful left total knee, loose left total knee  Plan: Brian Kramer is a 77 year old male who presents with a painful left total knee replacement with bone scan confirming tibial loosening. Synovial fluid aspirated from the left knee shows no signs of any infection. We have negative synovial fluid cultures. Patient's left total knee has become increasingly more painful and severely debilitating. His pain interferes with his quality of life and activities of daily living. He has pain with rest as well as with activity. Risks, benefits, complications of a left total knee revision have been discussed with the patient. Patient has agreed and consented the procedure with Dr. Audelia Acton on 04/06/2023.  The hospitalization and post-operative care and rehabilitation were also discussed. The use of perioperative antibiotics and DVT prophylaxis were discussed. The risk, benefits  and alternatives to a surgical intervention were discussed at length with the patient. The patient was also advised of risks related to the medical comorbidities and elevated body mass index (BMI). A lengthy discussion took place to review the most common complications including but not limited to: stiffness, loss of function, complex regional pain syndrome, deep vein thrombosis, pulmonary embolus, heart attack, stroke, infection, wound breakdown, numbness, intraoperative fracture, damage to nerves, tendon,muscles, arteries or other blood vessels, death and other possible complications from anesthesia. The patient was told that we will take steps to minimize these risks by using sterile technique, antibiotics and DVT prophylaxis when appropriate and follow the patient postoperatively in the office setting to  monitor progress. The possibility of recurrent pain, no improvement in pain and actual worsening of pain were also discussed with the patient. We did discuss the possibility of fracture while removing the prior components as well as the possibility of encountering infection during the surgery. No sign of infection on outpatient labs with borderline inflammatory markers. Discussed if signs of infection are found would proceed with temporary spacer placement and antibiotics.   All questions answered patient agrees with plan for revision left total knee arthroplasty.

## 2023-04-06 NOTE — Anesthesia Preprocedure Evaluation (Addendum)
Anesthesia Evaluation  Patient identified by MRN, date of birth, ID band Patient awake    Reviewed: Allergy & Precautions, NPO status , Patient's Chart, lab work & pertinent test results  Airway Mallampati: II  TM Distance: >3 FB Neck ROM: full    Dental  (+) Dental Advidsory Given, Edentulous Lower, Edentulous Upper   Pulmonary COPD,  oxygen dependent, former smoker   Pulmonary exam normal        Cardiovascular hypertension, + CAD, + Past MI and + Peripheral Vascular Disease  Normal cardiovascular exam+ dysrhythmias  Rhythm:regular Rate:Normal     Neuro/Psych  Headaches PSYCHIATRIC DISORDERS Anxiety Depression    negative neurological ROS  negative psych ROS   GI/Hepatic negative GI ROS, Neg liver ROS, hiatal hernia,GERD  ,,  Endo/Other  diabetes    Renal/GU CRFRenal disease     Musculoskeletal   Abdominal   Peds  Hematology negative hematology ROS (+)   Anesthesia Other Findings Patient with PMH of sinus bradycardia and a RBBB on his latest EKG. Patient presents today with a Potassium level of 5.5. Explained the patient that this level is our personal maximum for elective anesthesia and that it increases his risk for arrhythmias and other problems under anesthesia, including death. Patient stated he understood and agreed to proceed.  Past Medical History: No date: Anemia No date: Anginal pain (HCC) No date: Arthritis No date: CKD (chronic kidney disease), stage III (HCC) No date: Cognitive impairment No date: COPD (chronic obstructive pulmonary disease) (HCC) No date: Coronary artery disease     Comment:  a.) MI 03/1995 - no PCI; b.) LHC/PCI 1997 - stent               (unknown type) x 1 to RCA; c.) LHC/PCI 2000 - stent               (unknown type) x 1 to LCx; d.) LHC 04/02/2010 - insig CAD              with widely patent stents; medical mgmt; e.) LHC               04/30/2015 - patent stents; insig CAD; medical  mgmt; f.)               R/LHC 05/01/20: EF 60%, norm cors. No date: Depression 09/05/2021: Diastolic dysfunction     Comment:  a.)  TTE 09/05/2021: EF 50%, mild BAE, trivial PR, mild               MR/TR; pHTN (RVSP 50 mmHg), G1DD. 04/08/2021: Essential hypertension No date: GERD (gastroesophageal reflux disease)     Comment:  a.) improved s/p sleeve gastrectomy; no daily Tx 2004: History of hiatal hernia     Comment:  a.) s/p repair No date: History of obstructive sleep apnea     Comment:  a.) resolved s/p sleeve gastrectomy; no longer requires               nocturnal PAP therapy No date: HLD (hyperlipidemia) No date: Insomnia No date: Migraines 03/1995: Myocardial infarction [facility])     Comment:  a.) full details unknown; LHC performed, however no PCI               performed. 04/08/2021: PAD (peripheral artery disease) (HCC) No date: Peripheral neuropathy No date: Presbycusis No date: PUD (peptic ulcer disease) No date: Pulmonary hypertension (HCC)     Comment:  a.) takes ambrisentan; b.)  R/LHC 05/01/2020: EF 60%,  mean PA 30, mean PCWP 16, RVEDP 19, Ao sat 95, Pa sat 74,              CO 8.2, PVR 137, SVR 736; c.)  TTE 09/05/2021: EF 50%,               RVSP 50 mmHg. No date: RLS (restless legs syndrome)     Comment:  a.) takes ropinirole No date: Thrombocytopenia (HCC) No date: Tremor No date: Type 2 diabetes, diet controlled (HCC)  Past Surgical History: 04/08/2018: CARPOMETACARPAL ([facility]) FUSION OF THUMB; Right     Comment:  Procedure: CARPOMETACARPAL [facility]) FUSION OF THUMB;                Surgeon: Kennedy Bucker, MD;  Location: [facility] ORS;                Service: Orthopedics;  Laterality: Right; 12/02/2018: CARPOMETACARPAL ([facility]) FUSION OF THUMB; Left     Comment:  Procedure: CARPOMETACARPAL ([facility]) FUSION OF  LEFT THUMB,               DIABETIC;  Surgeon: Kennedy Bucker, MD;  Location: [facility]               ORS;  Service: Orthopedics;  Laterality: Left; 2015: CATARACT  EXTRACTION; Bilateral No date: COLONOSCOPY 05/19/2018: COLONOSCOPY WITH PROPOFOL; N/A     Comment:  Procedure: COLONOSCOPY WITH PROPOFOL;  Surgeon: [place],               Boykin Nearing, MD;  Location: [facility] ENDOSCOPY;  Service:               Gastroenterology;  Laterality: N/A; 1997: CORONARY ANGIOPLASTY WITH STENT PLACEMENT; Left     Comment:  Procedure: CORONARY ANGIOPLASTY WITH STENT PLACEMENT;               stent x1 (unknown type) to RCA 2000: CORONARY ANGIOPLASTY WITH STENT PLACEMENT; Left     Comment:  Procedure: CORONARY ANGIOPLASTY WITH STENT PLACEMENT;               stent x1 (unknown type) to LCx 10/04/2021: EXCISION PARTIAL PHALANX; Left     Comment:  Procedure: EXCISION PARTIAL PHALANX - 2, 3, 4;  Surgeon:              Linus Galas, DPM;  Location: [facility] ORS;  Service:               Podiatry;  Laterality: Left; 01/15/2018: HAMMER TOE SURGERY; Right     Comment:  Procedure: HAMMER TOE CORRECTION-2ND & 3RD;  Surgeon:               Recardo Evangelist, DPM;  Location: [facility] ORS;  Service:               Podiatry;  Laterality: Right; 2012: HEEL SPUR RESECTION No date: INGUINAL HERNIA REPAIR; Bilateral 2005: KNEE ARTHROSCOPY; Right 2006: KNEE ARTHROSCOPY; Left 03/17/2016: LAPAROSCOPIC GASTRIC SLEEVE RESECTION; N/A 04/02/2010: LEFT HEART CATH AND CORONARY ANGIOGRAPHY; Left     Comment:  Procedure: LEFT HEART CATH AND CORONARY ANGIOGRAPHY;               Location: [facility]; Surgeon: Phil Dopp, MD 04/30/2015: LEFT HEART CATH AND CORONARY ANGIOGRAPHY; Left     Comment:  Procedure: LEFT HEART CATH AND CORONARY ANGIOGRAPHY;               Location: [facility]; Surgeon: Aletha Halim, MD 05/01/2020: RIGHT AND LEFT HEART CATH; Left  Comment:  Procedure: RIGHT AND LEFT HEART CATH;  Surgeon:               Alwyn Pea, MD;  Location: [facility] IN[facility]IVE [facility];               Service: Cardiovascular;  Laterality: Left; No date: Right shoulder AC separation surgery in 1995; Right 2017: Stem Cells to left  heel No date: TONSILLECTOMY 2007: TOTAL KNEE ARTHROPLASTY; Left 2015: TOTAL KNEE ARTHROPLASTY; Right 04/14/2019: ULNAR TUNNEL RELEASE; Left     Comment:  Procedure: LEFT CUBITAL TUNNEL RELEASE;  Surgeon: Kennedy Bucker, MD;  Location: [facility] ORS;  Service: Orthopedics;               Laterality: Left; 2017: UPPER GI ENDOSCOPY No date: VASECTOMY     Comment:  1970s 01/15/2018: WEIL OSTEOTOMY; Right     Comment:  Procedure: WEIL OSTEOTOMY-2ND & 3RD;  Surgeon: Recardo Evangelist, DPM;  Location: [facility] ORS;  Service: Podiatry;                Laterality: Right;  BMI    Body Mass Index: 36.92 kg/m    PMH of PHTN  Reproductive/Obstetrics negative OB ROS                             Anesthesia Physical Anesthesia Plan  ASA: 3  Anesthesia Plan: General ETT and General   Post-op Pain Management:    Induction: Intravenous  PONV Risk Score and Plan: 2 and Ondansetron, Dexamethasone and Midazolam  Airway Management Planned: Oral ETT  Additional Equipment:   Intra-op Plan:   Post-operative Plan: Extubation in OR  Informed Consent: I have reviewed the patients History and Physical, chart, labs and discussed the procedure including the risks, benefits and alternatives for the proposed anesthesia with the patient or authorized representative who has indicated his/her understanding and acceptance.     Dental Advisory Given  Plan Discussed with: Anesthesiologist, CRNA and Surgeon  Anesthesia Plan Comments: (Patient consented for risks of anesthesia including but not limited to:  - adverse reactions to medications - damage to eyes, teeth, lips or other oral mucosa - nerve damage due to positioning  - sore throat or hoarseness - Damage to heart, brain, nerves, lungs, other parts of body or loss of life  Patient voiced understanding and assent.)       Anesthesia Quick Evaluation

## 2023-04-07 ENCOUNTER — Inpatient Hospital Stay: Payer: Medicare PPO

## 2023-04-07 ENCOUNTER — Encounter: Payer: Self-pay | Admitting: Orthopedic Surgery

## 2023-04-07 DIAGNOSIS — R059 Cough, unspecified: Secondary | ICD-10-CM | POA: Insufficient documentation

## 2023-04-07 DIAGNOSIS — B370 Candidal stomatitis: Secondary | ICD-10-CM | POA: Insufficient documentation

## 2023-04-07 DIAGNOSIS — Z96652 Presence of left artificial knee joint: Secondary | ICD-10-CM | POA: Diagnosis not present

## 2023-04-07 DIAGNOSIS — E875 Hyperkalemia: Secondary | ICD-10-CM

## 2023-04-07 LAB — HEPATIC FUNCTION PANEL
ALT: 13 U/L (ref 0–44)
AST: 24 U/L (ref 15–41)
Albumin: 3.8 g/dL (ref 3.5–5.0)
Alkaline Phosphatase: 58 U/L (ref 38–126)
Bilirubin, Direct: <0.1 mg/dL (ref 0.0–0.2)
Total Bilirubin: 0.6 mg/dL (ref 0.3–1.2)
Total Protein: 7.1 g/dL (ref 6.5–8.1)

## 2023-04-07 LAB — CBC
HCT: 28.2 % — ABNORMAL LOW (ref 39.0–52.0)
Hemoglobin: 9.2 g/dL — ABNORMAL LOW (ref 13.0–17.0)
MCH: 30.3 pg (ref 26.0–34.0)
MCHC: 32.6 g/dL (ref 30.0–36.0)
MCV: 92.8 fL (ref 80.0–100.0)
Platelets: 100 10*3/uL — ABNORMAL LOW (ref 150–400)
RBC: 3.04 MIL/uL — ABNORMAL LOW (ref 4.22–5.81)
RDW: 13.8 % (ref 11.5–15.5)
WBC: 5.7 10*3/uL (ref 4.0–10.5)
nRBC: 0 % (ref 0.0–0.2)

## 2023-04-07 LAB — CK: Total CK: 240 U/L (ref 49–397)

## 2023-04-07 LAB — BASIC METABOLIC PANEL
Anion gap: 3 — ABNORMAL LOW (ref 5–15)
Anion gap: 4 — ABNORMAL LOW (ref 5–15)
Anion gap: 7 (ref 5–15)
BUN: 29 mg/dL — ABNORMAL HIGH (ref 8–23)
BUN: 28 mg/dL — ABNORMAL HIGH (ref 8–23)
BUN: 26 mg/dL — ABNORMAL HIGH (ref 8–23)
CO2: 25 mmol/L (ref 22–32)
CO2: 23 mmol/L (ref 22–32)
CO2: 23 mmol/L (ref 22–32)
Calcium: 8.2 mg/dL — ABNORMAL LOW (ref 8.9–10.3)
Calcium: 8.2 mg/dL — ABNORMAL LOW (ref 8.9–10.3)
Calcium: 8.8 mg/dL — ABNORMAL LOW (ref 8.9–10.3)
Chloride: 109 mmol/L (ref 98–111)
Chloride: 111 mmol/L (ref 98–111)
Chloride: 110 mmol/L (ref 98–111)
Creatinine, Ser: 1.34 mg/dL — ABNORMAL HIGH (ref 0.61–1.24)
Creatinine, Ser: 1.33 mg/dL — ABNORMAL HIGH (ref 0.61–1.24)
Creatinine, Ser: 1.35 mg/dL — ABNORMAL HIGH (ref 0.61–1.24)
GFR, Estimated: 54 mL/min — ABNORMAL LOW (ref >60)
GFR, Estimated: 55 mL/min — ABNORMAL LOW (ref >60)
GFR, Estimated: 54 mL/min — ABNORMAL LOW (ref >60)
Glucose, Bld: 130 mg/dL — ABNORMAL HIGH (ref 70–99)
Glucose, Bld: 107 mg/dL — ABNORMAL HIGH (ref 70–99)
Glucose, Bld: 109 mg/dL — ABNORMAL HIGH (ref 70–99)
Potassium: 6.7 mmol/L (ref 3.5–5.1)
Potassium: 6 mmol/L — ABNORMAL HIGH (ref 3.5–5.1)
Potassium: 5.6 mmol/L — ABNORMAL HIGH (ref 3.5–5.1)
Sodium: 137 mmol/L (ref 135–145)
Sodium: 138 mmol/L (ref 135–145)
Sodium: 140 mmol/L (ref 135–145)

## 2023-04-07 LAB — PROCALCITONIN: Procalcitonin: <0.1 ng/mL

## 2023-04-07 LAB — MAGNESIUM: Magnesium: 2.2 mg/dL (ref 1.7–2.4)

## 2023-04-07 MED ORDER — CEFAZOLIN SODIUM-DEXTROSE 2-4 GM/100ML-% IV SOLN
2.0000 g | Freq: Four times a day (QID) | INTRAVENOUS | Status: AC
  Administered 2023-04-07 (×2): 2 g via INTRAVENOUS
  Filled 2023-04-07 (×2): qty 100

## 2023-04-07 MED ORDER — SODIUM CHLORIDE 0.9 % IV SOLN: INTRAVENOUS | Status: DC

## 2023-04-07 MED ORDER — TRAMADOL HCL 50 MG PO TABS: 50.0000 mg | ORAL_TABLET | Freq: Four times a day (QID) | ORAL | 0 refills | Status: DC | PRN

## 2023-04-07 MED ORDER — ASPIRIN 81 MG PO TBEC
81.0000 mg | DELAYED_RELEASE_TABLET | Freq: Every day | ORAL | Status: DC
  Administered 2023-04-08: 81 mg via ORAL
  Filled 2023-04-07: qty 1

## 2023-04-07 MED ORDER — UMECLIDINIUM BROMIDE 62.5 MCG/ACT IN AEPB
1.0000 | INHALATION_SPRAY | Freq: Every day | RESPIRATORY_TRACT | Status: DC
  Administered 2023-04-07 – 2023-04-09 (×3): 1 via RESPIRATORY_TRACT
  Filled 2023-04-07: qty 7

## 2023-04-07 MED ORDER — FLUCONAZOLE IN SODIUM CHLORIDE 200-0.9 MG/100ML-% IV SOLN
200.0000 mg | Freq: Once | INTRAVENOUS | Status: AC
  Administered 2023-04-07: 200 mg via INTRAVENOUS
  Filled 2023-04-07 (×2): qty 100

## 2023-04-07 MED ORDER — PREGABALIN 75 MG PO CAPS
200.0000 mg | ORAL_CAPSULE | Freq: Three times a day (TID) | ORAL | Status: DC
  Administered 2023-04-07 – 2023-04-09 (×5): 200 mg via ORAL
  Filled 2023-04-07 (×5): qty 1

## 2023-04-07 MED ORDER — SODIUM ZIRCONIUM CYCLOSILICATE 10 G PO PACK
10.0000 g | PACK | Freq: Three times a day (TID) | ORAL | Status: AC
  Filled 2023-04-07 (×3): qty 1

## 2023-04-07 MED ORDER — HYDROCODONE-ACETAMINOPHEN 5-325 MG PO TABS
1.0000 | ORAL_TABLET | ORAL | Status: DC | PRN
  Administered 2023-04-07: 1 via ORAL
  Filled 2023-04-07 (×2): qty 1
  Filled 2023-04-07: qty 2

## 2023-04-07 MED ORDER — FLUCONAZOLE 100MG IVPB
100.0000 mg | INTRAVENOUS | Status: DC
  Filled 2023-04-07: qty 50

## 2023-04-07 MED ORDER — ACETAMINOPHEN 500 MG PO TABS: 1000.0000 mg | ORAL_TABLET | Freq: Three times a day (TID) | ORAL | 0 refills | Status: DC

## 2023-04-07 MED ORDER — ALBUTEROL SULFATE HFA 108 (90 BASE) MCG/ACT IN AERS: 2.0000 | INHALATION_SPRAY | Freq: Four times a day (QID) | RESPIRATORY_TRACT | Status: DC | PRN

## 2023-04-07 MED ORDER — ENOXAPARIN SODIUM 30 MG/0.3ML IJ SOSY
30.0000 mg | PREFILLED_SYRINGE | Freq: Two times a day (BID) | INTRAMUSCULAR | Status: DC
  Administered 2023-04-07 – 2023-04-09 (×5): 30 mg via SUBCUTANEOUS
  Filled 2023-04-07 (×5): qty 0.3

## 2023-04-07 MED ORDER — ROPINIROLE HCL 1 MG PO TABS
2.0000 mg | ORAL_TABLET | Freq: Every day | ORAL | Status: DC
  Administered 2023-04-08 – 2023-04-09 (×2): 2 mg via ORAL
  Filled 2023-04-07 (×2): qty 2

## 2023-04-07 MED ORDER — METOCLOPRAMIDE HCL 5 MG PO TABS: 5.0000 mg | ORAL_TABLET | Freq: Three times a day (TID) | ORAL | Status: DC | PRN

## 2023-04-07 MED ORDER — TRAMADOL HCL 50 MG PO TABS
50.0000 mg | ORAL_TABLET | Freq: Four times a day (QID) | ORAL | Status: DC | PRN
  Administered 2023-04-08: 50 mg via ORAL
  Filled 2023-04-07: qty 1

## 2023-04-07 MED ORDER — ROSUVASTATIN CALCIUM 5 MG PO TABS
5.0000 mg | ORAL_TABLET | Freq: Every day | ORAL | Status: DC
  Administered 2023-04-07 – 2023-04-09 (×3): 5 mg via ORAL
  Filled 2023-04-07 (×3): qty 1

## 2023-04-07 MED ORDER — DULOXETINE HCL 30 MG PO CPEP
90.0000 mg | ORAL_CAPSULE | Freq: Every day | ORAL | Status: DC
  Administered 2023-04-07 – 2023-04-09 (×3): 90 mg via ORAL
  Filled 2023-04-07 (×3): qty 3

## 2023-04-07 MED ORDER — AMBRISENTAN 5 MG PO TABS: 5.0000 mg | ORAL_TABLET | Freq: Every day | ORAL | Status: DC

## 2023-04-07 MED ORDER — ALBUTEROL SULFATE (2.5 MG/3ML) 0.083% IN NEBU: 2.5000 mg | INHALATION_SOLUTION | Freq: Four times a day (QID) | RESPIRATORY_TRACT | Status: DC | PRN

## 2023-04-07 MED ORDER — MORPHINE SULFATE (PF) 2 MG/ML IV SOLN
0.5000 mg | INTRAVENOUS | Status: DC | PRN
  Administered 2023-04-07 – 2023-04-08 (×4): 1 mg via INTRAVENOUS
  Filled 2023-04-07 (×4): qty 1

## 2023-04-07 MED ORDER — ROPINIROLE HCL 1 MG PO TABS
1.0000 mg | ORAL_TABLET | Freq: Two times a day (BID) | ORAL | Status: DC
  Administered 2023-04-08 (×2): 1 mg via ORAL
  Filled 2023-04-07 (×2): qty 1

## 2023-04-07 MED ORDER — METOCLOPRAMIDE HCL 5 MG/ML IJ SOLN: 5.0000 mg | Freq: Three times a day (TID) | INTRAMUSCULAR | Status: DC | PRN

## 2023-04-07 MED ORDER — FERROUS SULFATE 325 (65 FE) MG PO TABS
325.0000 mg | ORAL_TABLET | Freq: Every day | ORAL | Status: DC
  Administered 2023-04-07 – 2023-04-09 (×3): 325 mg via ORAL
  Filled 2023-04-07 (×3): qty 1

## 2023-04-07 MED ORDER — ROPINIROLE HCL ER 4 MG PO TB24: 4.0000 mg | ORAL_TABLET | Freq: Every day | ORAL | Status: DC

## 2023-04-07 MED ORDER — QUETIAPINE FUMARATE 25 MG PO TABS
25.0000 mg | ORAL_TABLET | Freq: Every day | ORAL | Status: DC
  Administered 2023-04-07 – 2023-04-08 (×2): 25 mg via ORAL
  Filled 2023-04-07 (×2): qty 1

## 2023-04-07 MED ORDER — FLUTICASONE FUROATE-VILANTEROL 100-25 MCG/ACT IN AEPB
1.0000 | INHALATION_SPRAY | Freq: Every day | RESPIRATORY_TRACT | Status: DC
  Administered 2023-04-07 – 2023-04-09 (×3): 1 via RESPIRATORY_TRACT
  Filled 2023-04-07: qty 28

## 2023-04-07 MED ORDER — DOCUSATE SODIUM 100 MG PO CAPS
100.0000 mg | ORAL_CAPSULE | Freq: Two times a day (BID) | ORAL | Status: DC
  Administered 2023-04-07 – 2023-04-09 (×5): 100 mg via ORAL
  Filled 2023-04-07 (×5): qty 1

## 2023-04-07 MED ORDER — DOCUSATE SODIUM 100 MG PO CAPS: 100.0000 mg | ORAL_CAPSULE | Freq: Two times a day (BID) | ORAL | 0 refills | Status: DC

## 2023-04-07 MED ORDER — ONDANSETRON HCL 4 MG PO TABS: 4.0000 mg | ORAL_TABLET | Freq: Four times a day (QID) | ORAL | Status: DC | PRN

## 2023-04-07 MED ORDER — MENTHOL 3 MG MT LOZG: 1.0000 | LOZENGE | OROMUCOSAL | Status: DC | PRN

## 2023-04-07 MED ORDER — PANTOPRAZOLE SODIUM 40 MG PO TBEC
40.0000 mg | DELAYED_RELEASE_TABLET | Freq: Every day | ORAL | Status: DC
  Administered 2023-04-07 – 2023-04-09 (×3): 40 mg via ORAL
  Filled 2023-04-07 (×3): qty 1

## 2023-04-07 MED ORDER — ACETAMINOPHEN 500 MG PO TABS
1000.0000 mg | ORAL_TABLET | Freq: Three times a day (TID) | ORAL | Status: AC
  Administered 2023-04-07 (×2): 1000 mg via ORAL
  Filled 2023-04-07 (×2): qty 2

## 2023-04-07 MED ORDER — ONDANSETRON HCL 4 MG/2ML IJ SOLN: 4.0000 mg | Freq: Four times a day (QID) | INTRAMUSCULAR | Status: DC | PRN

## 2023-04-07 MED ORDER — ENOXAPARIN SODIUM 40 MG/0.4ML IJ SOSY: 40.0000 mg | PREFILLED_SYRINGE | INTRAMUSCULAR | 0 refills | Status: DC

## 2023-04-07 MED ORDER — PHENOL 1.4 % MT LIQD: 1.0000 | OROMUCOSAL | Status: DC | PRN

## 2023-04-07 MED ORDER — AMBRISENTAN 5 MG PO TABS
5.0000 mg | ORAL_TABLET | Freq: Every day | ORAL | Status: DC
  Administered 2023-04-07 – 2023-04-09 (×3): 5 mg via ORAL
  Filled 2023-04-07 (×3): qty 1

## 2023-04-07 MED ORDER — ONDANSETRON HCL 4 MG PO TABS: 4.0000 mg | ORAL_TABLET | Freq: Four times a day (QID) | ORAL | 0 refills | Status: DC | PRN

## 2023-04-07 NOTE — Assessment & Plan Note (Signed)
On 04/06/2023, management per primary team

## 2023-04-07 NOTE — Progress Notes (Addendum)
Patient became choked drinking ice coffee, nurse was notified, and patient was in distress, continuing to clear his throat. The nurse suction patient several times and removed the brown liquid from the back of patient throat. The wife states this occurred on Friday as well. Patient does have some wheezing and states there is something stuck in his throat. Will continue to monitor.and assess patient. MD has been made aware.

## 2023-04-07 NOTE — Assessment & Plan Note (Signed)
SLP consulted Aspiration precautions N.p.o. pending SLP evaluation

## 2023-04-07 NOTE — Discharge Instructions (Signed)

## 2023-04-07 NOTE — Assessment & Plan Note (Addendum)
Etiology workup in progress, suspect secondary to acute kidney injury presumed secondary to volume depletion/dehydration prior to surgery and Check CK level, magnesium level Avoid potassium sparing medication Agree with sodium chloride infusion, increase rate from 100 mL/h to 125 mL/h given that patient is currently n.p.o. pending SLP evaluation.

## 2023-04-07 NOTE — Consult Note (Signed)
History and Physical   Brian Kramer ZOX:[id number] DOB: 1946-01-31 DOA: 04/06/2023  PCP: Marisue Ivan, MD  Outpatient Specialists: Dr. [facility]  I have personally briefly reviewed patient's old medical records in [facility] EMR.  Requesting service: Orthopedic service Consulting reason:   HPI: Mr. Preet Newnham is a 77 year old male with history of hypertension, pulmonary hypertension, depression, hyperlipidemia, neuropathy, moderate COPD, PAD, who was a direct admission for left total knee revision.  Vitals on the day of medicine consult showed temperature of 97.8, respiration rate 20, heart rate 61, blood pressure 125/53, SpO2 100% on room air.  Serum sodium is 138, potassium 6.0, chloride 111, bicarb 23, BUN of 28, serum creatinine 1.33, EGFR 55, nonfasting blood glucose 107, WBC 5.7, hemoglobin 9.2, platelets of 100.  Hospitalist service was consulted for hyperkalemia and increased cough with brown sputum production.  Chest x-ray 2 view was read as prominent bilateral interstitial opacity and could represent pulmonary venous congestion or atypical infection.  Orthopedic team: Ordered low, however patient not able to tolerate p.o. intake. ------------------------------ At bedside, patient was able to tell me his name, age, location, current calendar year.  Patient reports that on Friday he had 1 episode of coughing and difficulty swallowing.  This resolved and he was able to eat on Saturday and Sunday.  He did not have anything to eat on Monday.  Today, he was able to swallow his a.m. medications without difficulty.  When attempting to eat breakfast, he developed a coughing fit and had difficulty swallowing.  Nursing was concerned and took the food away.  At bedside, he denies shortness of breath, chest pain, dysuria, hematuria, diarrhea.  He last had a bowel movement on 04/06/2023 and it was normal.  He denies nausea, vomiting.  Social  history: He lives at home with his wife. He is a former tobacco user, quiting in 1996. He infrequently drinks etoh, last drink was two weeks.   ROS: Constitutional: no weight change, no fever ENT/Mouth: no sore throat, no rhinorrhea Eyes: no eye pain, no vision changes Cardiovascular: no chest pain, no dyspnea,  no edema, no palpitations Respiratory: + cough, no sputum, no wheezing Gastrointestinal: no nausea, no vomiting, no diarrhea, no constipation Genitourinary: no urinary incontinence, no dysuria, no hematuria Musculoskeletal: no arthralgias, no myalgias Skin: no skin lesions, no pruritus, Neuro: + weakness, no loss of consciousness, no syncope Psych: no anxiety, no depression, no decrease appetite Heme/Lymph: no bruising, no bleeding  Assessment/Plan  Principal Problem:   S/P revision of total knee, left Active Problems:   Hyperkalemia   PAD (peripheral artery disease) (HCC)   Essential hypertension   CAD (coronary artery disease)   Hyperlipidemia   COPD, moderate (HCC)   Mild pulmonary hypertension (HCC)   Moderate major depression (HCC)   AKI (acute kidney injury) (HCC)   Oral candidosis   Coughing   Assessment and Plan:  * S/P revision of total knee, left On 04/06/2023, management per primary team  Hyperkalemia Etiology workup in progress, suspect secondary to acute kidney injury presumed secondary to volume depletion/dehydration prior to surgery and Check CK level, magnesium level Avoid potassium sparing medication Agree with sodium chloride infusion, increase rate from 100 mL/h to 125 mL/h given that patient is currently n.p.o. pending SLP evaluation.    Coughing SLP consulted Aspiration precautions N.p.o. pending SLP evaluation  Oral candidosis Fluconazole 200 mg IV one-time dose ordered on day of consultation Fluconazole 100 mg daily starting on 04/08/2023, this can be transitioned  to 100 mg p.o. daily at the time of discharge to complete 14-day  course Patient will need follow-up with PCP as appropriate  Chart reviewed.   Thank you for involving [facility] and the care of Mr. Cadena.  We will continue to follow in consultation.  DVT prophylaxis: Enoxaparin per primary team Code Status: Full code Diet: N.p.o. pending SLP evaluation Family Communication: Updated spouse and daughter at bedside with patient's permission Disposition Plan: Per primary team Consults called: Dr. Allena Katz, orthopedic specialist Admission status: med-surg, inpatient  Past Medical History:  Diagnosis Date   Anemia    Anginal pain (HCC)    Anxiety    Arthritis    Bilateral renal cysts    CKD (chronic kidney disease), stage III (HCC)    COPD (chronic obstructive pulmonary disease) (HCC)    Coronary artery disease    a.) MI 03/1995 - no PCI; b.) LHC/PCI 1997 - stent (unknown type) x 1 to RCA; c.) LHC/PCI 2000 - stent (unknown type) x 1 to LCx; d.) LHC 04/02/2010 - insig CAD with widely patent stents; medical mgmt; e.) LHC 04/30/2015 - patent stents; insig CAD; medical mgmt; f.) R/LHC 05/01/20: EF 60%, norm cors.   Depression    Diastolic dysfunction 09/05/2021   a.)  TTE 09/05/2021: EF 50%, mild BAE, trivial PR, mild MR/TR; pHTN (RVSP 50 mmHg), G1DD.   Essential hypertension 04/08/2021   GERD (gastroesophageal reflux disease)    a.) improved s/p sleeve gastrectomy; no daily Tx   History of hiatal hernia 2004   a.) s/p repair   History of obstructive sleep apnea    a.) resolved s/p sleeve gastrectomy; no longer requires nocturnal PAP therapy   HLD (hyperlipidemia)    Incomplete right bundle branch block (RBBB)    Insomnia    Lewy body dementia (HCC)    Long term current use of aspirin    Loosening of knee joint prosthesis (HCC)    Migraines    Myocardial infarction (HCC) 03/1995   a.) full details unknown; LHC performed, however no PCI performed.   On supplemental oxygen by nasal cannula    a.) 2L/[place] PRN   PAD (peripheral artery disease)  (HCC) 04/08/2021   Peripheral neuropathy    Pneumonia    Presbycusis    PUD (peptic ulcer disease)    Pulmonary hypertension (HCC)    a.) takes ambrisentan; b.)  R/LHC 05/01/2020: EF 60%, mean PA 30, mean PCWP 16, RVEDP 19, Ao sat 95, PA sat 74, CO 8.2, PVR 137, SVR 736; c.)  TTE 09/05/2021: EF 50%, RVSP 50 mmHg.   RLS (restless legs syndrome)    a.) takes ropinirole   SOB (shortness of breath) 06/28/2022   Thrombocytopenia (HCC)    Tremor    Type 2 diabetes, diet controlled [facility])    Past Surgical History:  Procedure Laterality Date   AMPUTATION TOE Right 12/20/2021   Procedure: AMPUTATION TOE - SECOND AND THIRD - 28825;  Surgeon: Linus Galas, DPM;  Location: [facility] ORS;  Service: Podiatry;  Laterality: Right;   CARPOMETACARPAL ([facility]) FUSION OF THUMB Right 04/08/2018   Procedure: CARPOMETACARPAL [facility]) FUSION OF THUMB;  Surgeon: Kennedy Bucker, MD;  Location: [facility] ORS;  Service: Orthopedics;  Laterality: Right;   CARPOMETACARPAL (CMC) FUSION OF THUMB Left 12/02/2018   Procedure: CARPOMETACARPAL (CMC) FUSION OF  LEFT THUMB, DIABETIC;  Surgeon: Kennedy Bucker, MD;  Location: [facility] ORS;  Service: Orthopedics;  Laterality: Left;   CATARACT EXTRACTION Bilateral 2015   COLONOSCOPY  COLONOSCOPY WITH PROPOFOL N/A 05/19/2018   Procedure: COLONOSCOPY WITH PROPOFOL;  Surgeon: Toledo, Boykin Nearing, MD;  Location: [facility] ENDOSCOPY;  Service: Gastroenterology;  Laterality: N/A;   CORONARY ANGIOPLASTY WITH STENT PLACEMENT Left 1997   Procedure: CORONARY ANGIOPLASTY WITH STENT PLACEMENT; stent x1 (unknown type) to RCA   CORONARY ANGIOPLASTY WITH STENT PLACEMENT Left 2000   Procedure: CORONARY ANGIOPLASTY WITH STENT PLACEMENT; stent x1 (unknown type) to LCx   EXCISION PARTIAL PHALANX Left 10/04/2021   Procedure: EXCISION PARTIAL PHALANX - 2, 3, 4;  Surgeon: Linus Galas, DPM;  Location: [facility] OR[facility];  Service: Podiatry;  Laterality: Left;   EYE SURGERY     cataract bilateral with lens   FLEXOR TENOTOMY  Right  12/20/2021   Procedure: FLEXOR TENOTOMY - FOURTH TOE - 78295;  Surgeon: Linus Galas, DPM;  Location: [facility];  Service: Podiatry;  Laterality: Right;   HAMMER TOE SURGERY Right 01/15/2018   Procedure: HAMMER TOE CORRECTION-2ND & 3RD;  Surgeon: Recardo Evangelist, DPM;  Location: [facility];  Service: Podiatry;  Laterality: Right;   HEEL SPUR RESECTION  2012   INGUINAL HERNIA REPAIR Bilateral    KNEE ARTHROSCOPY Right 2005   KNEE ARTHROSCOPY Left 2006   LAPAROSCOPIC GASTRIC SLEEVE RESECTION N/A 03/17/2016   LEFT HEART CATH AND CORONARY ANGIOGRAPHY Left 04/02/2010   Procedure: LEFT HEART CATH AND CORONARY ANGIOGRAPHY; Location: [facility]; Surgeon: Phil Dopp, MD   LEFT HEART CATH AND CORONARY ANGIOGRAPHY Left 04/30/2015   Procedure: LEFT HEART CATH AND CORONARY ANGIOGRAPHY; Location: [facility]; Surgeon: Aletha Halim, MD   RIGHT AND LEFT HEART CATH Left 05/01/2020   Procedure: RIGHT AND LEFT HEART CATH;  Surgeon: Alwyn Pea, MD;  Location: [facility] IN[facility];  Service: Cardiovascular;  Laterality: Left;   Right shoulder AC separation surgery in 1995 Right    Stem Cells to left heel  2017   TONSILLECTOMY     TOTAL KNEE ARTHROPLASTY Left 2007   TOTAL KNEE ARTHROPLASTY Right 2015   TOTAL KNEE REVISION Left 04/06/2023   Procedure: Left total knee revision, both components;  Surgeon: Reinaldo Berber, MD;  Location: [facility];  Service: Orthopedics;  Laterality: Left;   ULNAR TUNNEL RELEASE Left 04/14/2019   Procedure: LEFT CUBITAL TUNNEL RELEASE;  Surgeon: Kennedy Bucker, MD;  Location: [facility];  Service: Orthopedics;  Laterality: Left;   UPPER GI ENDOSCOPY  2017   VASECTOMY     1970s   WEIL OSTEOTOMY Right 01/15/2018   Procedure: WEIL OSTEOTOMY-2ND & 3RD;  Surgeon: Recardo Evangelist, DPM;  Location: [facility];  Service: Podiatry;  Laterality: Right;   Social History:  reports that he quit smoking about 29 years ago. His smoking use included cigarettes. He has never used smokeless tobacco. He  reports current alcohol use of about 1.0 - 2.0 standard drink of alcohol per week. He reports that he does not use drugs.  Allergies  Allergen Reactions   Tape Other (See Comments)    Round, white EKG pads only - "ate holes in my skin" 1997 when stays on skin for longer period Any tape OK if it doesn't stay on too long   Penicillins Itching, Rash and Other (See Comments)    Has patient had a PCN reaction causing immediate rash, facial/tongue/throat swelling, SOB or lightheadedness with hypotension: Yes Has patient had a PCN reaction causing severe rash involving mucus membranes or skin necrosis: No Has patient had a PCN reaction that required hospitalization: No Has patient had a PCN reaction occurring within the last  10 years: No If all of the above answers are "NO", then may proceed with Cephalosporin use.    Shellfish Allergy Rash and Other (See Comments)    PT CAN NOT EAT CRAB or lobster, BUT CAN EAT shrimp. No problems with betadine or iodine   Family History  Problem Relation Age of Onset   Hypertension Mother    Hypertension Father    Stroke Father    Hypertension Maternal Grandmother    Family history: Family history reviewed and not pertinent  Prior to Admission medications   Medication Sig Start Date End Date Taking? Authorizing Provider  albuterol (VENTOLIN HFA) 108 (90 Base) MCG/ACT inhaler Inhale 2 puffs into the lungs every 6 (six) hours as needed for wheezing or shortness of breath.   Yes [provider]  ambrisentan (LETAIRIS) 5 MG tablet Take 5 mg by mouth daily. 02/21/21  Yes [provider]  DULoxetine HCl 30 MG CSDR Take 90 mg by mouth daily. 04/19/20  Yes [provider]  enoxaparin (LOVENOX) 40 MG/0.4ML injection Inject 0.4 mLs (40 mg total) into the skin daily for 14 days. 04/07/23 04/21/23 Yes Evon Slack, PA-C  Fluticasone-Umeclidin-Vilant (TRELEGY ELLIPTA) 100-62.5-25 MCG/ACT AEPB Inhale 1 puff into the lungs daily. 01/24/21   Yes [provider]  LYRICA 200 MG capsule Take 200 mg by mouth 3 (three) times daily.    Yes [provider]  OXYGEN Inhale 2 L into the lungs at bedtime.   Yes [provider]  QUEtiapine (SEROQUEL) 100 MG tablet Take 25 mg by mouth at bedtime.   Yes [provider]  rOPINIRole (REQUIP XL) 4 MG 24 hr tablet Take 4 mg by mouth at bedtime.   Yes [provider]  rosuvastatin (CRESTOR) 5 MG tablet Take 5 mg by mouth daily.  08/22/17  Yes [provider]  acetaminophen (TYLENOL) 500 MG tablet Take 2 tablets (1,000 mg total) by mouth every 8 (eight) hours. 04/07/23   Evon Slack, PA-C  aspirin EC 81 MG tablet Take 81 mg by mouth at bedtime.     [provider]  azelastine (ASTELIN) 0.1 % nasal spray Place 1 spray into both nostrils 2 (two) times daily as needed for rhinitis or allergies. Use in each nostril as directed    [provider]  Calcium 500-100 MG-UNIT CHEW Chew 500 mg by mouth 2 (two) times daily. Calcium is bariatric chewable with vitamin D included    [provider]  docusate sodium (COLACE) 100 MG capsule Take 1 capsule (100 mg total) by mouth 2 (two) times daily. 04/07/23   Evon Slack, PA-C  ferrous sulfate 325 (65 FE) MG tablet Take 325 mg by mouth daily with breakfast.    [provider]  ipratropium (ATROVENT) 0.03 % nasal spray Place 2 sprays into both nostrils 3 (three) times daily as needed for rhinitis.     [provider]  mometasone (ELOCON) 0.1 % lotion 1-4 drops See admin instructions. 1-4 drops in each ear as needed for itching 08/19/21   [provider]  Multiple Vitamins-Minerals (BARIATRIC MULTIVITAMINS/IRON PO) Take 1 tablet by mouth daily.    [provider]  nitroGLYCERIN (NITROSTAT) 0.4 MG SL tablet Place 0.4 mg under the tongue every 5 (five) minutes as needed for chest pain.  10/21/12   [provider]  ondansetron (ZOFRAN) 4 MG tablet  Take 1 tablet (4 mg total) by mouth every 6 (six) hours as needed for nausea. 04/07/23  Evon Slack, PA-C  traMADol (ULTRAM) 50 MG tablet Take 1 tablet (50 mg total) by mouth every 6 (six) hours as needed for moderate pain (pain score 4-6). 04/07/23   Evon Slack, PA-C   Physical Exam: Vitals:   04/06/23 2334 04/07/23 0517 04/07/23 0802 04/07/23 1451  BP: (!) 131/54 (!) 103/46 (!) 125/53 (!) 129/53  Pulse: 91 63 61 63  Resp: 18 18 20 18   Temp: 97.9 F (36.6 C) 97.6 F (36.4 C) 97.8 F (36.6 C) 97.7 F (36.5 C)  TempSrc: Oral   Oral  SpO2: 94% 94% 100% 93%  Weight:      Height:       Constitutional: appears frail, NAD, calm Eyes: PERRL, lids and conjunctivae normal ENMT: Mucous membranes are moist. Posterior pharynx clear of any exudate or lesions. Age-appropriate dentition. Hearing appropriate Neck: normal, supple, no masses, no thyromegaly Respiratory: clear to auscultation bilaterally, no wheezing, no crackles. Normal respiratory effort. No accessory muscle use.  Cardiovascular: Regular rate and rhythm, no murmurs / rubs / gallops. No extremity edema. 2+ pedal pulses. No carotid bruits.  Abdomen: no tenderness, no masses palpated, no hepatosplenomegaly. Bowel sounds positive.  Musculoskeletal: no clubbing / cyanosis. No joint deformity upper and lower extremities. Good ROM, no contractures, no atrophy. Normal muscle tone.  Skin: no rashes, lesions, ulcers. No induration Neurologic: Sensation intact. Strength 5/5 in all 4.  Psychiatric: Normal judgment and insight. Alert and oriented x 3. Normal mood.   EKG: independently reviewed, showing sinus bradycardia with rate of 59, QTc 435  Chest x-ray on Admission: I personally reviewed and I agree with radiologist reading as below.  DG Chest 2 View  Result Date: 04/07/2023 CLINICAL DATA:  cough, productive cough EXAM: CHEST - 2 VIEW COMPARISON:  CXR 06/25/22 FINDINGS: No pleural effusion. No pneumothorax. Unchanged cardiac  and mediastinal contours. No focal airspace opacity. No radiographically apparent displaced rib fractures. There are prominent bilateral interstitial opacities which could represent pulmonary venous congestion or atypical infection. Vertebral body heights are maintained. IMPRESSION: Prominent bilateral interstitial opacities could represent pulmonary venous congestion or atypical infection. Electronically Signed   By: Lorenza Cambridge M.D.   On: 04/07/2023 12:48   DG Knee 1-2 Views Left  Result Date: 04/06/2023 CLINICAL DATA:  Status post total left knee arthroplasty revision. EXAM: LEFT KNEE - 1-2 VIEW COMPARISON:  None Available. FINDINGS: Status post total left knee arthroplasty with moderate long femoral and tibial stems. No perihardware lucency is seen to indicate hardware failure or loosening. Expected postoperative changes including moderate joint effusion and intra-articular air. Mild anterior subcutaneous air. No acute fracture or dislocation. IMPRESSION: Status post total left knee arthroplasty revision without evidence of hardware failure. Electronically Signed   By: Neita Garnet M.D.   On: 04/06/2023 19:16    Labs on Admission: I have personally reviewed following labs  CBC: Recent Labs  Lab 04/06/23 1131 04/07/23 0439  WBC  --  5.7  HGB 11.2* 9.2*  HCT 33.0* 28.2*  MCV  --  92.8  PLT  --  100*   Basic Metabolic Panel: Recent Labs  Lab 04/06/23 1131 04/07/23 0439 04/07/23 0702 04/07/23 1417  NA 140 137 138 140  K 5.5* 6.7* 6.0* 5.6*  CL 107 109 111 110  CO2  --  25 23 23   GLUCOSE 99 130* 107* 109*  BUN 28* 29* 28* 26*  CREATININE 1.50* 1.34* 1.33* 1.35*  CALCIUM  --  8.2* 8.2* 8.8*  MG  --   --   --  2.2   GFR: Estimated Creatinine Clearance: 57.6 mL/min (A) (by C-G formula based on SCr of 1.35 mg/dL (H)).  Liver Function Tests: Recent Labs  Lab 04/07/23 0111  AST 24  ALT 13  ALKPHOS 58  BILITOT 0.6  PROT 7.1  ALBUMIN 3.8   CBG: Recent Labs  Lab  04/06/23 1719  GLUCAP 150*   Urine analysis:    Component Value Date/Time   COLORURINE YELLOW (A) 03/23/2023 0905   APPEARANCEUR HAZY (A) 03/23/2023 0905   LABSPEC 1.018 03/23/2023 0905   PHURINE 5.0 03/23/2023 0905   GLUCOSEU NEGATIVE 03/23/2023 0905   HGBUR NEGATIVE 03/23/2023 0905   BILIRUBINUR NEGATIVE 03/23/2023 0905   KETONESUR NEGATIVE 03/23/2023 0905   PROTEINUR NEGATIVE 03/23/2023 0905   NITRITE NEGATIVE 03/23/2023 0905   LEUKOCYTESUR NEGATIVE 03/23/2023 0905   This document was prepared using Dragon Voice Recognition software and may include unintentional dictation errors.  Dr. Sedalia Muta [facility]  If 7PM-7AM, please contact overnight-coverage provider If 7AM-7PM, please contact day attending provider [email or web address]  04/07/2023, 3:31 PM

## 2023-04-07 NOTE — Plan of Care (Signed)
  Problem: Education: Goal: Knowledge of General Education information will improve Description: Including pain rating scale, medication(s)/side effects and non-pharmacologic comfort measures Outcome: Progressing   Problem: Health Behavior/Discharge Planning: Goal: Ability to manage health-related needs will improve Outcome: Progressing   Problem: Clinical Measurements: Goal: Ability to maintain clinical measurements within normal limits will improve Outcome: Progressing Goal: Will remain free from infection Outcome: Progressing Goal: Diagnostic test results will improve Outcome: Progressing Goal: Respiratory complications will improve Outcome: Progressing Goal: Cardiovascular complication will be avoided Outcome: Progressing   Problem: Activity: Goal: Risk for activity intolerance will decrease Outcome: Progressing   Problem: Nutrition: Goal: Adequate nutrition will be maintained Outcome: Progressing   Problem: Coping: Goal: Level of anxiety will decrease Outcome: Progressing   Problem: Elimination: Goal: Will not experience complications related to bowel motility Outcome: Progressing Goal: Will not experience complications related to urinary retention Outcome: Progressing   Problem: Pain Management: Goal: General experience of comfort will improve Outcome: Progressing   Problem: Safety: Goal: Ability to remain free from injury will improve Outcome: Progressing   Problem: Skin Integrity: Goal: Risk for impaired skin integrity will decrease Outcome: Progressing   Problem: Education: Goal: Knowledge of the prescribed therapeutic regimen will improve Outcome: Progressing Goal: Individualized Educational Video(s) Outcome: Progressing   Problem: Activity: Goal: Ability to avoid complications of mobility impairment will improve Outcome: Progressing Goal: Range of joint motion will improve Outcome: Progressing   Problem: Clinical Measurements: Goal:  Postoperative complications will be avoided or minimized Outcome: Progressing   Problem: Pain Management: Goal: Pain level will decrease with appropriate interventions Outcome: Progressing   Problem: Skin Integrity: Goal: Will show signs of wound healing Outcome: Progressing

## 2023-04-07 NOTE — Assessment & Plan Note (Addendum)
Fluconazole 200 mg IV one-time dose ordered on day of consultation Fluconazole 100 mg daily starting on 04/08/2023, this can be transitioned to 100 mg p.o. daily at the time of discharge to complete 14-day course Patient will need follow-up with PCP as appropriate

## 2023-04-07 NOTE — Progress Notes (Signed)
Subjective: 1 Day Post-Op Procedure(s) (LRB): Left total knee revision, both components (Left) Patient reports pain as mild.   Patient is well, and has had no acute complaints or problems Denies any CP, SOB, ABD pain. We will continue therapy today.  Plan is to go Home after hospital stay.  Objective: Vital signs in last 24 hours: Temp:  [97.1 F (36.2 C)-99 F (37.2 C)] 97.6 F (36.4 C) (10/29 0517) Pulse Rate:  [50-102] 63 (10/29 0517) Resp:  [8-20] 18 (10/29 0517) BP: (90-169)/(46-116) 103/46 (10/29 0517) SpO2:  [90 %-100 %] 94 % (10/29 0517) Weight:  [119.7 kg] 119.7 kg (10/28 1200)  Intake/Output from previous day: 10/28 0701 - 10/29 0700 In: 1500 [I.V.:1100; IV Piggyback:400] Out: 1070 [Urine:970; Blood:100] Intake/Output this shift: No intake/output data recorded.  Recent Labs    04/06/23 1131 04/07/23 0439  HGB 11.2* 9.2*   Recent Labs    04/06/23 1131 04/07/23 0439  WBC  --  5.7  RBC  --  3.04*  HCT 33.0* 28.2*  PLT  --  100*   Recent Labs    04/07/23 0439 04/07/23 0702  NA 137 138  K 6.7* 6.0*  CL 109 111  CO2 25 23  BUN 29* 28*  CREATININE 1.34* 1.33*  GLUCOSE 130* 107*  CALCIUM 8.2* 8.2*   No results for input(s): "LABPT", "INR" in the last 72 hours.  EXAM General - Patient is Alert, Appropriate, and Oriented Extremity - Neurovascular intact Sensation intact distally Intact pulses distally Dorsiflexion/Plantar flexion intact Dressing - dressing C/D/I and no drainage Motor Function - intact, moving foot and toes well on exam.   Past Medical History:  Diagnosis Date   Anemia    Anginal pain (HCC)    Anxiety    Arthritis    Bilateral renal cysts    CKD (chronic kidney disease), stage III (HCC)    COPD (chronic obstructive pulmonary disease) (HCC)    Coronary artery disease    a.) MI 03/1995 - no PCI; b.) LHC/PCI 1997 - stent (unknown type) x 1 to RCA; c.) LHC/PCI 2000 - stent (unknown type) x 1 to LCx; d.) LHC 04/02/2010 -  insig CAD with widely patent stents; medical mgmt; e.) LHC 04/30/2015 - patent stents; insig CAD; medical mgmt; f.) R/LHC 05/01/20: EF 60%, norm cors.   Depression    Diastolic dysfunction 09/05/2021   a.)  TTE 09/05/2021: EF 50%, mild BAE, trivial PR, mild MR/TR; pHTN (RVSP 50 mmHg), G1DD.   Essential hypertension 04/08/2021   GERD (gastroesophageal reflux disease)    a.) improved s/p sleeve gastrectomy; no daily Tx   History of hiatal hernia 2004   a.) s/p repair   History of obstructive sleep apnea    a.) resolved s/p sleeve gastrectomy; no longer requires nocturnal PAP therapy   HLD (hyperlipidemia)    Incomplete right bundle branch block (RBBB)    Insomnia    Lewy body dementia (HCC)    Long term current use of aspirin    Loosening of knee joint prosthesis (HCC)    Migraines    Myocardial infarction (HCC) 03/1995   a.) full details unknown; LHC performed, however no PCI performed.   On supplemental oxygen by nasal cannula    a.) 2L/[place] PRN   PAD (peripheral artery disease) (HCC) 04/08/2021   Peripheral neuropathy    Pneumonia    Presbycusis    PUD (peptic ulcer disease)    Pulmonary hypertension (HCC)    a.) takes ambrisentan; b.)  R/LHC 05/01/2020: EF 60%, mean PA 30, mean PCWP 16, RVEDP 19, Ao sat 95, PA sat 74, CO 8.2, PVR 137, SVR 736; c.)  TTE 09/05/2021: EF 50%, RVSP 50 mmHg.   RLS (restless legs syndrome)    a.) takes ropinirole   SOB (shortness of breath) 06/28/2022   Thrombocytopenia (HCC)    Tremor    Type 2 diabetes, diet controlled (HCC)     Assessment/Plan:   1 Day Post-Op Procedure(s) (LRB): Left total knee revision, both components (Left) Principal Problem:   S/P revision of total knee, left  Estimated body mass index is 38.97 kg/m as calculated from the following:   Height as of this encounter: 5\' 9"  (1.753 m).   Weight as of this encounter: 119.7 kg. Advance diet Up with therapy Doing well with no complaints. Pain well-controlled Vital signs are  stable Hyperkalemia-potassium 6.0.  Down from 6.7.  Asymptomatic. Will start lokelma. Recheck K this afternoon. Care management to assist with discharge    DVT Prophylaxis - Lovenox, TED hose, and SCDs Weight-Bearing as tolerated to left leg   T. Cranston Neighbor, PA-C [facility] 04/07/2023, 8:00 AM

## 2023-04-07 NOTE — Plan of Care (Signed)
  Problem: Education: Goal: Knowledge of General Education information will improve Description: Including pain rating scale, medication(s)/side effects and non-pharmacologic comfort measures Outcome: Progressing   Problem: Coping: Goal: Level of anxiety will decrease Outcome: Progressing   Problem: Elimination: Goal: Will not experience complications related to urinary retention Outcome: Progressing   Problem: Skin Integrity: Goal: Risk for impaired skin integrity will decrease Outcome: Progressing   Problem: Education: Goal: Knowledge of the prescribed therapeutic regimen will improve Outcome: Progressing   Problem: Activity: Goal: Range of joint motion will improve Outcome: Progressing

## 2023-04-07 NOTE — Hospital Course (Signed)
Brian Kramer is a 77 year old male with history of hypertension, pulmonary hypertension, depression, hyperlipidemia, neuropathy, moderate COPD, PAD, who was a direct admission for left total knee revision.  Vitals on the day of medicine consult showed temperature of 97.8, respiration rate 20, heart rate 61, blood pressure 125/53, SpO2 100% on room air.  Serum sodium is 138, potassium 6.0, chloride 111, bicarb 23, BUN of 28, serum creatinine 1.33, EGFR 55, nonfasting blood glucose 107, WBC 5.7, hemoglobin 9.2, platelets of 100.  Hospitalist service was consulted for hyperkalemia and increased cough with brown sputum production.  Chest x-ray 2 view was read as prominent bilateral interstitial opacity and could represent pulmonary venous congestion or atypical infection.  Orthopedic team: Ordered low, however patient not able to tolerate p.o. intake.

## 2023-04-07 NOTE — Consult Note (Signed)
Monitoring by Pharmacy for Pulmonary Hypertension Treatment   Indication - Continuation of prior to admission medication   Patient is 77 y.o.  male with history of PAH on chronic ambrisentan (LETAIRIS) PTA and will be continued while hospitalized.   Continuing this medication order as an inpatient requires that monitoring parameters per REMS requirements must be met.  Chronic therapy is under the supervision of Ned Clines, MD who is enrolled in the REMS program and is being notified of continuation of therapy. A staff message in EPIC has been sent notifying the certified prescriber. Per patient report has previously been educated on Hepatotoxicity. On admission pregnancy risk has been assessed and no monitoring required because patient is male; Hepatic function has been evaluated. AST / ALT appropriate to continue medication at this time.     Latest Ref Rng & Units 04/07/2023    1:11 AM 03/23/2023    9:05 AM 06/28/2022    3:34 AM  Hepatic Function  Total Protein 6.5 - 8.1 g/dL 7.1  7.2  6.1   Albumin 3.5 - 5.0 g/dL 3.8  3.9  3.2   AST 15 - 41 U/L 24  21  34   ALT 0 - 44 U/L 13  15  17    Alk Phosphatase 38 - 126 U/L 58  59  51   Total Bilirubin 0.3 - 1.2 mg/dL 0.6  0.4  0.6   Bilirubin, Direct 0.0 - 0.2 mg/dL <2.9       If any question arise or pregnancy is identified during hospitalization, contact for bosentan: [phone]; macitentan: [phone]; ambrisentan: [phone].  Thank for you allowing [place] to participate in the care of this patient.  Will M. Dareen Piano, PharmD Clinical Pharmacist 04/07/2023 10:36 AM  Guides for Male Patients:  ambrisentan (LETAIRIS), macitentan (OPSUMIT), bosentan (TRACLEER).

## 2023-04-07 NOTE — Discharge Summary (Signed)
Physician Discharge Summary  Patient ID: Brian Kramer MRN: [id number] DOB/AGE: [birth year] 77 y.o.  Admit date: 04/06/2023 Discharge date: 04/09/2023  Admission Diagnoses:  S/P revision of total knee, left [Z96.652]   Discharge Diagnoses: Patient Active Problem List   Diagnosis Date Noted   Hyperkalemia 04/07/2023   Oral candidosis 04/07/2023   Coughing 04/07/2023   S/P revision of total knee, 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   SOB (shortness of breath) 06/28/2022   AKI (acute kidney injury) (HCC) 06/26/2022   COPD with acute exacerbation (HCC) 06/25/2022   Acute on chronic respiratory failure with hypoxia (HCC) 06/25/2022   Pneumonia due to COVID-19 virus 06/25/2022   Dementia without behavioral disturbance (HCC) 06/25/2022   Frailty 06/25/2022   COPD, moderate (HCC) 06/23/2022   Mild pulmonary hypertension (HCC) 01/02/2022   PAD (peripheral artery disease) (HCC) 04/08/2021   Essential hypertension 04/08/2021   CAD (coronary artery disease) 04/08/2021   Hyperlipidemia 04/08/2021   Leg pain 04/08/2021   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   Depression    Diastolic dysfunction 09/05/2021   a.)  TTE 09/05/2021: EF 50%, mild BAE, trivial PR, mild MR/TR; pHTN (RVSP 50 mmHg), G1DD.   Essential hypertension 04/08/2021   GERD (gastroesophageal reflux disease)    a.) improved s/p sleeve gastrectomy; no daily Tx   History of hiatal  hernia 2004   a.) s/p repair   History of obstructive sleep apnea    a.) resolved s/p sleeve gastrectomy; no longer requires nocturnal PAP therapy   HLD (hyperlipidemia)    Incomplete right bundle branch block (RBBB)    Insomnia    Lewy body dementia (HCC)    Long term current use of aspirin    Loosening of knee joint prosthesis (HCC)    Migraines    Myocardial infarction (HCC) 03/1995   a.) full details unknown; LHC performed, however no PCI performed.   On supplemental oxygen by nasal cannula    a.) 2L/[place] PRN   PAD (peripheral artery disease) (HCC) 04/08/2021   Peripheral neuropathy    Pneumonia    Presbycusis    PUD (peptic ulcer disease)    Pulmonary hypertension (HCC)    a.) takes ambrisentan; b.)  R/LHC 05/01/2020: EF 60%, mean PA 30, mean PCWP 16, RVEDP 19, Ao sat 95, PA sat 74, CO 8.2, PVR 137, SVR 736; c.)  TTE 09/05/2021: EF 50%, RVSP 50 mmHg.   RLS (restless legs syndrome)    a.) takes ropinirole   SOB (shortness of breath) 06/28/2022   Thrombocytopenia (HCC)    Tremor    Type 2 diabetes, diet controlled (HCC)      Transfusion: nne   Consultants (if any):   Discharged Condition: Improved  Hospital Course: Brian Kramer is an 77 y.o. male who was admitted 04/06/2023 with a diagnosis of S/P revision of  total knee, left and went to the operating room on 04/06/2023 and underwent the above named procedures.    Surgeries: Procedure(s): Left total knee revision, both components on 04/06/2023 Patient tolerated the surgery well. Taken to PACU where she was stabilized and then transferred to the orthopedic floor.  Started on Lovenox 30 mg q 12 hrs. TEDs and SCDs applied bilaterally. Heels elevated on bed. No evidence of DVT. Negative [place]. On postop day 1, patient with elevated potassium of 6.0 and acute renal insufficiency with creatinine of 1.33.  Patient also suffered a choking fit when she was drinking coffee and nursing noted choking and brown liquid was  suctioned from the back of the throat.  A medicine consult was placed.  Chest x-ray was ordered showing pulmonary venous congestion versus atypical infection.  Patient was started on azithromycin.  Patient was given IV fluid hydration, thought to be dehydrated.  Patient also noted to have oral candidiasis and started on IV fluconazole.  A speech therapy consult was placed.  Good prior to physical therapy on postop day 1. On postop day 2, patient's labs within normal limits.  Potassium and kidney function back to normal.  Speech eval showed no oropharyngeal swallowing issues.  Patient tolerating p.o. well.  Pain well-controlled.  Good progress of physical therapy On post op day #3 patient was able to safely and independently complete all PT goals. PT recommending discharge to home.   patient was stable and ready for discharge to home with HHPT.  Implants: Femur PRK 9+ w/ 135x30mm splined stem and 5mm posterior augments x2   Tibia PRK F w/ fixed tibia central cone and 135x63mm splined stem  Poly 14mm CPS  Patella unchanged    He was given perioperative antibiotics:  Anti-infectives (From admission, onward)    Start     Dose/Rate Route Frequency Ordered Stop   04/09/23 1000  azithromycin (ZITHROMAX) tablet 250 mg       Placed in "Followed by" Linked Group   250 mg Oral Daily 04/08/23 1021 04/13/23 0959   04/08/23 1115  fluconazole (DIFLUCAN) tablet 100 mg        100 mg Oral Daily 04/08/23 1020     04/08/23 1115  azithromycin (ZITHROMAX) tablet 500 mg       Placed in "Followed by" Linked Group   500 mg Oral Daily 04/08/23 1021 04/08/23 1214   04/08/23 1000  fluconazole (DIFLUCAN) IVPB 100 mg  Status:  Discontinued        100 mg 50 mL/hr over 60 Minutes Intravenous Every 24 hours 04/07/23 1515 04/08/23 1020   04/08/23 0000  fluconazole (DIFLUCAN) 100 MG tablet        100 mg Oral Daily 04/08/23 1149 04/21/23 2359   04/08/23 0000  azithromycin (ZITHROMAX) 250 MG tablet        250 mg Oral Daily  04/08/23 1149     04/07/23 1445  fluconazole (DIFLUCAN) IVPB 200 mg        200 mg 100 mL/hr over 60 Minutes Intravenous  Once 04/07/23 1346 04/07/23 1633   04/07/23 0034  ceFAZolin (ANCEF) IVPB 2g/100 mL premix        2 g 200 mL/hr over 30 Minutes Intravenous Every 6 hours 04/07/23 0034 04/07/23 0824   04/06/23 1115  ceFAZolin (ANCEF) IVPB 3g/100 mL premix        3 g 200 mL/hr over 30 Minutes Intravenous On call to O.R. 04/06/23 1104 04/06/23 1346     .  He was  given sequential compression devices, early ambulation, and Lovneox TEDs for DVT prophylaxis.  He benefited maximally from the hospital stay and there were no complications.    Recent vital signs:  Vitals:   04/08/23 1831 04/09/23 0917  BP: (!) 117/54 (!) 108/51  Pulse: 87 84  Resp: 16 16  Temp: 98.7 F (37.1 C) 98.3 F (36.8 C)  SpO2: 100% 97%    Recent laboratory studies:  Lab Results  Component Value Date   HGB 10.0 (L) 04/08/2023   HGB 9.2 (L) 04/07/2023   HGB 11.2 (L) 04/06/2023   Lab Results  Component Value Date   WBC 10.9 (H) 04/08/2023   PLT 134 (L) 04/08/2023   Lab Results  Component Value Date   INR 1.1 06/25/2022   Lab Results  Component Value Date   NA 138 04/09/2023   K 4.2 04/09/2023   CL 109 04/09/2023   CO2 21 (L) 04/09/2023   BUN 23 04/09/2023   CREATININE 1.35 (H) 04/09/2023   GLUCOSE 133 (H) 04/09/2023    Discharge Medications:   Allergies as of 04/09/2023       Reactions   Tape Other (See Comments)   Round, white EKG pads only - "ate holes in my skin" 1997 when stays on skin for longer period Any tape OK if it doesn't stay on too long   Penicillins Itching, Rash, Other (See Comments)   Has patient had a PCN reaction causing immediate rash, facial/tongue/throat swelling, SOB or lightheadedness with hypotension: Yes Has patient had a PCN reaction causing severe rash involving mucus membranes or skin necrosis: No Has patient had a PCN reaction that required hospitalization:  No Has patient had a PCN reaction occurring within the last 10 years: No If all of the above answers are "NO", then may proceed with Cephalosporin use.   Shellfish Allergy Rash, Other (See Comments)   PT CAN NOT EAT CRAB or lobster, BUT CAN EAT shrimp. No problems with betadine or iodine        Medication List     STOP taking these medications    lisinopril 2.5 MG tablet Commonly known as: ZESTRIL   oxybutynin 5 MG 24 hr tablet Commonly known as: DITROPAN-XL       TAKE these medications    acetaminophen 500 MG tablet Commonly known as: TYLENOL Take 2 tablets (1,000 mg total) by mouth every 8 (eight) hours.   acetaminophen 500 MG tablet Commonly known as: TYLENOL Take 2 tablets (1,000 mg total) by mouth every 8 (eight) hours.   albuterol 108 (90 Base) MCG/ACT inhaler Commonly known as: VENTOLIN HFA Inhale 2 puffs into the lungs every 6 (six) hours as needed for wheezing or shortness of breath.   ambrisentan 5 MG tablet Commonly known as: LETAIRIS Take 5 mg by mouth daily.   aspirin EC 81 MG tablet Take 81 mg by mouth at bedtime.   azelastine 0.1 % nasal spray Commonly known as: ASTELIN Place 1 spray into both nostrils 2 (two) times daily as needed for rhinitis or allergies. Use in each nostril as directed   azithromycin 250 MG tablet Commonly known as: Zithromax Take 1 tablet (250 mg total) by mouth daily.   BARIATRIC MULTIVITAMINS/IRON PO Take 1 tablet by mouth daily.   Calcium 500-100 MG-UNIT Chew Chew 500 mg by mouth 2 (two) times daily. Calcium is bariatric chewable with vitamin D included   docusate sodium 100 MG capsule Commonly known as: COLACE Take 1 capsule (100 mg total) by mouth  2 (two) times daily.   DULoxetine HCl 30 MG Csdr Take 90 mg by mouth daily. What changed: Another medication with the same name was removed. Continue taking this medication, and follow the directions you see here.   enoxaparin 40 MG/0.4ML injection Commonly known  as: LOVENOX Inject 0.4 mLs (40 mg total) into the skin daily for 14 days.   ferrous sulfate 325 (65 FE) MG tablet Take 325 mg by mouth daily with breakfast.   fluconazole 100 MG tablet Commonly known as: DIFLUCAN Take 1 tablet (100 mg total) by mouth daily for 13 days.   ipratropium 0.03 % nasal spray Commonly known as: ATROVENT Place 2 sprays into both nostrils 3 (three) times daily as needed for rhinitis.   Lyrica 200 MG capsule Generic drug: pregabalin Take 200 mg by mouth 3 (three) times daily.   mometasone 0.1 % lotion Commonly known as: ELOCON 1-4 drops See admin instructions. 1-4 drops in each ear as needed for itching   nitroGLYCERIN 0.4 MG SL tablet Commonly known as: NITROSTAT Place 0.4 mg under the tongue every 5 (five) minutes as needed for chest pain.   ondansetron 4 MG tablet Commonly known as: ZOFRAN Take 1 tablet (4 mg total) by mouth every 6 (six) hours as needed for nausea.   oxyCODONE 5 MG immediate release tablet Commonly known as: Oxy IR/ROXICODONE Take 0.5-1 tablets (2.5-5 mg total) by mouth every 4 (four) hours as needed for severe pain (pain score 7-10).   OXYGEN Inhale 2 L into the lungs at bedtime.   polyethylene glycol 17 g packet Commonly known as: MIRALAX / GLYCOLAX Take 17 g by mouth daily.   QUEtiapine 100 MG tablet Commonly known as: SEROQUEL Take 25 mg by mouth at bedtime. What changed: Another medication with the same name was removed. Continue taking this medication, and follow the directions you see here.   rOPINIRole 4 MG 24 hr tablet Commonly known as: REQUIP XL Take 4 mg by mouth at bedtime.   rosuvastatin 5 MG tablet Commonly known as: CRESTOR Take 5 mg by mouth daily.   traMADol 50 MG tablet Commonly known as: ULTRAM Take 1 tablet (50 mg total) by mouth every 6 (six) hours as needed for moderate pain (pain score 4-6).   Trelegy Ellipta 100-62.5-25 MCG/ACT Aepb Generic drug: Fluticasone-Umeclidin-Vilant Inhale 1 puff  into the lungs daily.               Durable Medical Equipment  (From admission, onward)           Start     Ordered   04/09/23 1027  For home use only DME Walker rolling  Once       Question Answer Comment  Walker: With 5 Inch Wheels   Patient needs a walker to treat with the following condition Pain      04/09/23 1027   04/09/23 1027  For home use only DME Bedside commode  Once       Question:  Patient needs a bedside commode to treat with the following condition  Answer:  Total knee replacement status   04/09/23 1027             Diagnostic Studies: DG Chest 2 View  Result Date: 04/07/2023 CLINICAL DATA:  cough, productive cough EXAM: CHEST - 2 VIEW COMPARISON:  CXR 06/25/22 FINDINGS: No pleural effusion. No pneumothorax. Unchanged cardiac and mediastinal contours. No focal airspace opacity. No radiographically apparent displaced rib fractures. There are prominent bilateral interstitial opacities which  could represent pulmonary venous congestion or atypical infection. Vertebral body heights are maintained. IMPRESSION: Prominent bilateral interstitial opacities could represent pulmonary venous congestion or atypical infection. Electronically Signed   By: Lorenza Cambridge M.D.   On: 04/07/2023 12:48   DG Knee 1-2 Views Left  Result Date: 04/06/2023 CLINICAL DATA:  Status post total left knee arthroplasty revision. EXAM: LEFT KNEE - 1-2 VIEW COMPARISON:  None Available. FINDINGS: Status post total left knee arthroplasty with moderate long femoral and tibial stems. No perihardware lucency is seen to indicate hardware failure or loosening. Expected postoperative changes including moderate joint effusion and intra-articular air. Mild anterior subcutaneous air. No acute fracture or dislocation. IMPRESSION: Status post total left knee arthroplasty revision without evidence of hardware failure. Electronically Signed   By: Neita Garnet M.D.   On: 04/06/2023 19:16    Disposition:  Discharge disposition: 06-Home-Health Care Svc          Follow-up Information     Evon Slack, PA-C Follow up in 2 week(s).   Specialties: Orthopedic Surgery, Emergency Medicine Contact information: [address] [place] [address] [phone]                  Signed: Patience Musca 04/09/2023, 10:30 AM

## 2023-04-07 NOTE — Evaluation (Signed)
Physical Therapy Evaluation Patient Details Name: Brian Kramer MRN: [id number] DOB: 03-18-1946 Today's Date: 04/07/2023  History of Present Illness  Pt is a 77 yo male s/p L TKA revision. PMH of COPD, CAD, MI, HTN, anxiety, depression, DM.  Clinical Impression  Pt A&Ox4, family at bedside reported 5/10 in L knee. At baseline the pt/family reported modI with varying devices and occasionally no AD for ambulation, modI for ADLs, and family assists with IADLs. 4 falls in the last 6 months.  He was able to perform supine to sit with CGA, use of bed rails. Sit <> stand from EOB and from recliner, progressed to CGA with reliance on BUE support. He was able to step pivot to recliner with CGA and RW, and after a seated rest break ambulated ~142ft. Noted for some fatigue but no LOB and spO2 on 2L >90% throughout. Returned to room with needs in reach.  Overall the patient demonstrated deficits (see "PT Problem List") that impede the patient's functional abilities, safety, and mobility and would benefit from skilled PT intervention.          If plan is discharge home, recommend the following: A little help with bathing/dressing/bathroom;Assistance with cooking/housework;Assist for transportation;Help with stairs or ramp for entrance;Direct supervision/assist for medications management   Can travel by private vehicle        Equipment Recommendations Rolling walker (2 wheels);BSC/3in1  Recommendations for Other Services       Functional Status Assessment Patient has had a recent decline in their functional status and demonstrates the ability to make significant improvements in function in a reasonable and predictable amount of time.     Precautions / Restrictions Precautions Precautions: Fall;Knee Precaution Booklet Issued: No Restrictions Weight Bearing Restrictions: Yes LLE Weight Bearing: Weight bearing as tolerated      Mobility  Bed Mobility Overal bed mobility: Needs  Assistance Bed Mobility: Supine to Sit     Supine to sit: Contact guard, HOB elevated, Used rails          Transfers Overall transfer level: Needs assistance Equipment used: Rolling walker (2 wheels) Transfers: Sit to/from Stand, Bed to chair/wheelchair/BSC Sit to Stand: Min assist, Contact guard assist   Step pivot transfers: Contact guard assist       General transfer comment: poor eccentric control when returning to sitting    Ambulation/Gait Ambulation/Gait assistance: Contact guard assist Gait Distance (Feet): 180 Feet Assistive device: Rolling walker (2 wheels)         General Gait Details: spO2 on 2L <90% throughout, some fatigue noted pt gait velocity improved over time  Stairs            Wheelchair Mobility     Tilt Bed    Modified Rankin (Stroke Patients Only)       Balance Overall balance assessment: Needs assistance Sitting-balance support: Feet supported Sitting balance-Leahy Scale: Good     Standing balance support: During functional activity, Reliant on assistive device for balance Standing balance-Leahy Scale: Fair                               Pertinent Vitals/Pain Pain Assessment Pain Assessment: 0-10 Pain Score: 5  Pain Location: L knee Pain Descriptors / Indicators: Aching, Sore Pain Intervention(s): Limited activity within patient's tolerance, Monitored during session, Repositioned, Ice applied    Home Living Family/patient expects to be discharged to:: Private residence Living Arrangements: Spouse/significant other Available Help at Discharge: Family;Available 24  hours/day Type of Home: House Home Access: Other (comment);Stairs to enter Entrance Stairs-Rails: None Entrance Stairs-Number of Steps: threshold   Home Layout: One level Home Equipment: Cane - single point;Crutches;Shower seat;Rollator (4 wheels);Standard Walker      Prior Function Prior Level of Function : Independent/Modified  Independent;History of Falls (last six months)             Mobility Comments: family reported 4 falls in the last 6 months       Extremity/Trunk Assessment   Upper Extremity Assessment Upper Extremity Assessment: Overall WFL for tasks assessed    Lower Extremity Assessment Lower Extremity Assessment: Generalized weakness    Cervical / Trunk Assessment Cervical / Trunk Assessment: Normal  Communication      Cognition Arousal: Alert Behavior During Therapy: WFL for tasks assessed/performed Overall Cognitive Status: Within Functional Limits for tasks assessed                                          General Comments      Exercises     Assessment/Plan    PT Assessment Patient needs continued PT services  PT Problem List Decreased strength;Pain;Decreased range of motion;Decreased activity tolerance;Decreased knowledge of use of DME;Decreased balance;Decreased mobility;Decreased knowledge of precautions       PT Treatment Interventions DME instruction;Neuromuscular re-education;Gait training;Stair training;Patient/family education;Functional mobility training;Therapeutic activities;Therapeutic exercise;Balance training    PT Goals (Current goals can be found in the Care Plan section)  Acute Rehab PT Goals Patient Stated Goal: to go home PT Goal Formulation: With patient Time For Goal Achievement: 04/21/23 Potential to Achieve Goals: Good    Frequency BID     Co-evaluation               AM-PAC PT "6 Clicks" Mobility  Outcome Measure Help needed turning from your back to your side while in a flat bed without using bedrails?: None Help needed moving from lying on your back to sitting on the side of a flat bed without using bedrails?: A Little Help needed moving to and from a bed to a chair (including a wheelchair)?: A Little Help needed standing up from a chair using your arms (e.g., wheelchair or bedside chair)?: A Little Help needed to  walk in hospital room?: A Little Help needed climbing 3-5 steps with a railing? : A Little 6 Click Score: 19    End of Session Equipment Utilized During Treatment: Gait belt Activity Tolerance: Patient tolerated treatment well Patient left: in chair;with call bell/phone within reach;with chair alarm set;with family/visitor present;with SCD's reapplied Nurse Communication: Mobility status PT Visit Diagnosis: Other abnormalities of gait and mobility (R26.89);Muscle weakness (generalized) (M62.81);Pain;Difficulty in walking, not elsewhere classified (R26.2) Pain - Right/Left: Left Pain - part of body: Knee    Time: 7829-5621 PT Time Calculation (min) (ACUTE ONLY): 32 min   Charges:   PT Evaluation $PT Eval Low Complexity: 1 Low PT Treatments $Therapeutic Activity: 23-37 mins PT General Charges $$ ACUTE PT VISIT: 1 Visit        Olga Coaster PT, DPT 4:05 PM,04/07/23

## 2023-04-07 NOTE — Progress Notes (Signed)
PT Cancellation Note  Patient Details Name: Brian Kramer MRN: [id number] DOB: 1945/08/07   Cancelled Treatment:    Reason Eval/Treat Not Completed: Other (comment). Pt waiting for xray (per RN on their way) PT to attempt as able.    Olga Coaster PT, DPT 9:59 AM,04/07/23

## 2023-04-08 DIAGNOSIS — Z96652 Presence of left artificial knee joint: Secondary | ICD-10-CM

## 2023-04-08 LAB — CBC
HCT: 30 % — ABNORMAL LOW (ref 39.0–52.0)
Hemoglobin: 10 g/dL — ABNORMAL LOW (ref 13.0–17.0)
MCH: 30.9 pg (ref 26.0–34.0)
MCHC: 33.3 g/dL (ref 30.0–36.0)
MCV: 92.6 fL (ref 80.0–100.0)
Platelets: 134 10*3/uL — ABNORMAL LOW (ref 150–400)
RBC: 3.24 MIL/uL — ABNORMAL LOW (ref 4.22–5.81)
RDW: 14 % (ref 11.5–15.5)
WBC: 10.9 10*3/uL — ABNORMAL HIGH (ref 4.0–10.5)
nRBC: 0 % (ref 0.0–0.2)

## 2023-04-08 LAB — BASIC METABOLIC PANEL
Anion gap: 8 (ref 5–15)
BUN: 22 mg/dL (ref 8–23)
CO2: 21 mmol/L — ABNORMAL LOW (ref 22–32)
Calcium: 8.5 mg/dL — ABNORMAL LOW (ref 8.9–10.3)
Chloride: 110 mmol/L (ref 98–111)
Creatinine, Ser: 1.15 mg/dL (ref 0.61–1.24)
GFR, Estimated: >60 mL/min (ref >60)
Glucose, Bld: 119 mg/dL — ABNORMAL HIGH (ref 70–99)
Potassium: 4.8 mmol/L (ref 3.5–5.1)
Sodium: 139 mmol/L (ref 135–145)

## 2023-04-08 LAB — PROCALCITONIN: Procalcitonin: 0.14 ng/mL

## 2023-04-08 MED ORDER — OXYCODONE HCL 5 MG PO TABS
2.5000 mg | ORAL_TABLET | ORAL | Status: DC | PRN
  Administered 2023-04-08 (×2): 5 mg via ORAL
  Filled 2023-04-08 (×2): qty 1

## 2023-04-08 MED ORDER — OXYCODONE HCL 5 MG PO TABS: 2.5000 mg | ORAL_TABLET | ORAL | 0 refills | Status: DC | PRN

## 2023-04-08 MED ORDER — AZITHROMYCIN 250 MG PO TABS
250.0000 mg | ORAL_TABLET | Freq: Every day | ORAL | Status: DC
Start: 2023-04-09 — End: 2023-04-09
  Administered 2023-04-09: 250 mg via ORAL
  Filled 2023-04-08: qty 1

## 2023-04-08 MED ORDER — ENOXAPARIN SODIUM 40 MG/0.4ML IJ SOSY: 40.0000 mg | PREFILLED_SYRINGE | INTRAMUSCULAR | 0 refills | Status: DC

## 2023-04-08 MED ORDER — FLUCONAZOLE 100 MG PO TABS
100.0000 mg | ORAL_TABLET | Freq: Every day | ORAL | Status: DC
  Administered 2023-04-08 – 2023-04-09 (×2): 100 mg via ORAL
  Filled 2023-04-08 (×2): qty 1

## 2023-04-08 MED ORDER — ACETAMINOPHEN 500 MG PO TABS: 1000.0000 mg | ORAL_TABLET | Freq: Three times a day (TID) | ORAL | Status: DC

## 2023-04-08 MED ORDER — AZITHROMYCIN 250 MG PO TABS: 250.0000 mg | ORAL_TABLET | Freq: Every day | ORAL | 0 refills | Status: DC

## 2023-04-08 MED ORDER — ACETAMINOPHEN 500 MG PO TABS
1000.0000 mg | ORAL_TABLET | Freq: Three times a day (TID) | ORAL | Status: DC
Start: 2023-04-08 — End: 2023-04-09
  Administered 2023-04-08 – 2023-04-09 (×3): 1000 mg via ORAL
  Filled 2023-04-08 (×3): qty 2

## 2023-04-08 MED ORDER — FLUCONAZOLE 100 MG PO TABS: 100.0000 mg | ORAL_TABLET | Freq: Every day | ORAL | 0 refills | Status: AC

## 2023-04-08 MED ORDER — AZITHROMYCIN 500 MG PO TABS
500.0000 mg | ORAL_TABLET | Freq: Every day | ORAL | Status: AC
  Administered 2023-04-08: 500 mg via ORAL
  Filled 2023-04-08 (×2): qty 1

## 2023-04-08 MED ORDER — ACETAMINOPHEN 10 MG/ML IV SOLN
1000.0000 mg | Freq: Four times a day (QID) | INTRAVENOUS | Status: DC
  Administered 2023-04-08: 1000 mg via INTRAVENOUS
  Filled 2023-04-08: qty 100

## 2023-04-08 NOTE — Progress Notes (Signed)
Subjective: 2 Days Post-Op Procedure(s) (LRB): Left total knee revision, both components (Left) Patient reports pain as moderate.  Compression stocking was causing a lot of discomfort so this was removed.  Ace wrap applied to the lower leg. Patient is well, and has had no acute complaints or problems Difficulty swallowing yesterday, made n.p.o. and speech therapy coming to evaluate today. Denies any CP, SOB, ABD pain.  No coughing We will continue therapy today.  Plan is to go Home after hospital stay.  Objective: Vital signs in last 24 hours: Temp:  [97.7 F (36.5 C)-100 F (37.8 C)] 100 F (37.8 C) (10/30 0045) Pulse Rate:  [61-86] 86 (10/30 0045) Resp:  [16-20] 16 (10/30 0045) BP: (123-129)/(41-53) 123/41 (10/30 0045) SpO2:  [93 %-100 %] 100 % (10/30 0045)  Intake/Output from previous day: 10/29 0701 - 10/30 0700 In: 2176.1 [P.O.:480; I.V.:1396.1; IV Piggyback:300] Out: 1900 [Urine:1900] Intake/Output this shift: No intake/output data recorded.  Recent Labs    04/06/23 1131 04/07/23 0439 04/08/23 0505  HGB 11.2* 9.2* 10.0*   Recent Labs    04/07/23 0439 04/08/23 0505  WBC 5.7 10.9*  RBC 3.04* 3.24*  HCT 28.2* 30.0*  PLT 100* 134*   Recent Labs    04/07/23 1417 04/08/23 0505  NA 140 139  K 5.6* 4.8  CL 110 110  CO2 23 21*  BUN 26* 22  CREATININE 1.35* 1.15  GLUCOSE 109* 119*  CALCIUM 8.8* 8.5*   No results for input(s): "LABPT", "INR" in the last 72 hours.  EXAM General - Patient is Alert, Appropriate, and Oriented Extremity - Neurovascular intact Sensation intact distally Intact pulses distally Dorsiflexion/Plantar flexion intact Dressing - dressing C/D/I and no drainage Motor Function - intact, moving foot and toes well on exam.   Past Medical History:  Diagnosis Date   Anemia    Anginal pain (HCC)    Anxiety    Arthritis    Bilateral renal cysts    CKD (chronic kidney disease), stage III (HCC)    COPD (chronic obstructive pulmonary  disease) (HCC)    Coronary artery disease    a.) MI 03/1995 - no PCI; b.) LHC/PCI 1997 - stent (unknown type) x 1 to RCA; c.) LHC/PCI 2000 - stent (unknown type) x 1 to LCx; d.) LHC 04/02/2010 - insig CAD with widely patent stents; medical mgmt; e.) LHC 04/30/2015 - patent stents; insig CAD; medical mgmt; f.) R/LHC 05/01/20: EF 60%, norm cors.   Depression    Diastolic dysfunction 09/05/2021   a.)  TTE 09/05/2021: EF 50%, mild BAE, trivial PR, mild MR/TR; pHTN (RVSP 50 mmHg), G1DD.   Essential hypertension 04/08/2021   GERD (gastroesophageal reflux disease)    a.) improved s/p sleeve gastrectomy; no daily Tx   History of hiatal hernia 2004   a.) s/p repair   History of obstructive sleep apnea    a.) resolved s/p sleeve gastrectomy; no longer requires nocturnal PAP therapy   HLD (hyperlipidemia)    Incomplete right bundle branch block (RBBB)    Insomnia    Lewy body dementia (HCC)    Long term current use of aspirin    Loosening of knee joint prosthesis (HCC)    Migraines    Myocardial infarction (HCC) 03/1995   a.) full details unknown; LHC performed, however no PCI performed.   On supplemental oxygen by nasal cannula    a.) 2L/ PRN   PAD (peripheral artery disease) (HCC) 04/08/2021   Peripheral neuropathy    Pneumonia  Presbycusis    PUD (peptic ulcer disease)    Pulmonary hypertension (HCC)    a.) takes ambrisentan; b.)  R/LHC 05/01/2020: EF 60%, mean PA 30, mean PCWP 16, RVEDP 19, Ao sat 95, PA sat 74, CO 8.2, PVR 137, SVR 736; c.)  TTE 09/05/2021: EF 50%, RVSP 50 mmHg.   RLS (restless legs syndrome)    a.) takes ropinirole   SOB (shortness of breath) 06/28/2022   Thrombocytopenia (HCC)    Tremor    Type 2 diabetes, diet controlled (HCC)     Assessment/Plan:   2 Days Post-Op Procedure(s) (LRB): Left total knee revision, both components (Left) Principal Problem:   S/P revision of total knee, left Active Problems:   PAD (peripheral artery disease) (HCC)   Essential  hypertension   CAD (coronary artery disease)   Hyperlipidemia   COPD, moderate (HCC)   Mild pulmonary hypertension (HCC)   Moderate major depression (HCC)   AKI (acute kidney injury) (HCC)   Hyperkalemia   Oral candidosis   Coughing  Estimated body mass index is 38.97 kg/m as calculated from the following:   Height as of this encounter: 5\' 9"  (1.753 m).   Weight as of this encounter: 119.7 kg. Advance diet Up with therapy Doing well with no complaints.  Pain moderate, continue with current pain regimen.  Vital signs are stable, low-grade temp.  Encourage incentive spirometer  Hyperkalemia-resolved with IV fluid hydration  Acute kidney injury -kidney function back to baseline.  He is continue with gentle IV fluid hydration  Hemoglobin stable 10.0  Oral candidiasis -continue with IV fluconazole per hospitalist.  Will complete p.o. course at discharge, 100 mg p.o. daily for total of 14 days.  SLP evaluation today.  Continue with aspiration precautions.  Patient remains n.p.o. pending SLP eval  Care management to assist with discharge.  Hopeful for discharge to home with home health PT    DVT Prophylaxis - Lovenox, TED hose, and SCDs Weight-Bearing as tolerated to left leg   T. Cranston Neighbor, PA-C [facility] 04/08/2023, 7:12 AM

## 2023-04-08 NOTE — Progress Notes (Signed)
Physical Therapy Treatment Patient Details Name: Brian Kramer MRN: [id number] DOB: [birth year] Today's Date: 04/08/2023   History of Present Illness Pt is a 77 yo male s/p L TKA revision. PMH of COPD, CAD, MI, HTN, anxiety, depression, DM.    PT Comments  Author returned for 2nd AM session. Pt had swallow study and was placed on mechanical soft diet. He is eager to attempt OOB/ambulation." I was able to do so much more yesterday." Chartered loss adjuster discussed POD 2 pain and need to continue to work on improving safe functional mobility. He requires assistance to stand form lower surface heights however was able to ambulate a short distance into hallway prior to needing seated rest. Overall pt is more limited thus far today than previous date. He did tolerate AROM/AAROM exercises  in recliner to promote increased ROM. Re-appplied ice + reviewed importance of proper positioning for proper healing. Author will return this afternoon in hopes to progress OOB activity + maximize pt's independence.    If plan is discharge home, recommend the following: A little help with bathing/dressing/bathroom;Assistance with cooking/housework;Assist for transportation;Help with stairs or ramp for entrance;Direct supervision/assist for medications management     Equipment Recommendations  Rolling walker (2 wheels);BSC/3in1       Precautions / Restrictions Precautions Precautions: Fall;Knee Precaution Booklet Issued: Yes (comment) Restrictions Weight Bearing Restrictions: Yes LLE Weight Bearing: Weight bearing as tolerated     Mobility  Bed Mobility Overal bed mobility: Needs Assistance Bed Mobility: Supine to Sit  Supine to sit: Mod assist, Max assist, Used rails  General bed mobility comments: in recliner pre-post session   Transfers Overall transfer level: Needs assistance Equipment used: Rolling walker (2 wheels) Transfers: Sit to/from Stand, Bed to chair/wheelchair/BSC Sit to Stand: Mod assist     General transfer comment: pt was able to stand from recliner surface with min-mod assist + alot of vcs for sequencing and technique. Heavy use of gait belt required to prevent posterior LOB back ino recliner    Ambulation/Gait Ambulation/Gait assistance: Min assist, Contact guard assist Gait Distance (Feet): 40 Feet Assistive device: Rolling walker (2 wheels) Gait Pattern/deviations: Step-to pattern, Antalgic, Trunk flexed Gait velocity: decreased  General Gait Details: Pt was bale to tolerate gait slightly better than when observed earlier this morning. He was only able o tolerate ~ 40 ft of ambulation prior to c/o fatigue an inability to continue further. chair follow for safety. pt remains on baseline 2 L o2 thoughout both PT sessions    Balance Overall balance assessment: Needs assistance Sitting-balance support: Feet supported Sitting balance-Leahy Scale: Fair Sitting balance - Comments: severe lateral lean away form operative LE   Standing balance support: Bilateral upper extremity supported, During functional activity Standing balance-Leahy Scale: Fair Standing balance comment: reliant on RW for all standing task       Cognition Arousal: Alert Behavior During Therapy: WFL for tasks assessed/performed Overall Cognitive Status: Within Functional Limits for tasks assessed    General Comments: Pt is A however has some cognition deficits com tolight throughout session. reliant on spouse/daughter for reorientation and for staying focused on desired task requested.           General Comments General comments (skin integrity, edema, etc.): issued HEP handout. will review in future sessions      Pertinent Vitals/Pain Pain Assessment Pain Assessment: 0-10 Pain Score: 8  Pain Location: L knee Pain Descriptors / Indicators: Aching, Sore Pain Intervention(s): Limited activity within patient's tolerance, Monitored during session, Premedicated before session, Repositioned,  Ice  applied     PT Goals (current goals can now be found in the care plan section) Acute Rehab PT Goals Patient Stated Goal: to go home Progress towards PT goals: Progressing toward goals    Frequency    BID       AM-PAC PT "6 Clicks" Mobility   Outcome Measure  Help needed turning from your back to your side while in a flat bed without using bedrails?: A Little Help needed moving from lying on your back to sitting on the side of a flat bed without using bedrails?: A Lot Help needed moving to and from a bed to a chair (including a wheelchair)?: A Lot Help needed standing up from a chair using your arms (e.g., wheelchair or bedside chair)?: A Lot Help needed to walk in hospital room?: A Lot Help needed climbing 3-5 steps with a railing? : A Lot 6 Click Score: 13    End of Session Equipment Utilized During Treatment: Gait belt Activity Tolerance: Patient limited by pain Patient left: in chair;with call bell/phone within reach;with chair alarm set;with family/visitor present;with SCD's reapplied Nurse Communication: Mobility status PT Visit Diagnosis: Other abnormalities of gait and mobility (R26.89);Muscle weakness (generalized) (M62.81);Pain;Difficulty in walking, not elsewhere classified (R26.2) Pain - Right/Left: Left Pain - part of body: Knee     Time: 4098-1191 PT Time Calculation (min) (ACUTE ONLY): 22 min  Charges:    $Gait Training: 8-22 mins $Therapeutic Activity: 8-22 mins PT General Charges $$ ACUTE PT VISIT: 1 Visit                     Jetta Lout PTA 04/08/23, 10:52 AM

## 2023-04-08 NOTE — Plan of Care (Signed)
  Problem: Education: Goal: Knowledge of General Education information will improve Description: Including pain rating scale, medication(s)/side effects and non-pharmacologic comfort measures Outcome: Progressing   Problem: Health Behavior/Discharge Planning: Goal: Ability to manage health-related needs will improve Outcome: Progressing   Problem: Clinical Measurements: Goal: Ability to maintain clinical measurements within normal limits will improve Outcome: Progressing Goal: Will remain free from infection Outcome: Progressing Goal: Diagnostic test results will improve Outcome: Progressing Goal: Respiratory complications will improve Outcome: Progressing Goal: Cardiovascular complication will be avoided Outcome: Progressing   Problem: Activity: Goal: Risk for activity intolerance will decrease Outcome: Progressing   Problem: Nutrition: Goal: Adequate nutrition will be maintained Outcome: Progressing   Problem: Coping: Goal: Level of anxiety will decrease Outcome: Progressing   Problem: Elimination: Goal: Will not experience complications related to bowel motility Outcome: Progressing Goal: Will not experience complications related to urinary retention Outcome: Progressing   Problem: Pain Management: Goal: General experience of comfort will improve Outcome: Progressing   Problem: Safety: Goal: Ability to remain free from injury will improve Outcome: Progressing   Problem: Skin Integrity: Goal: Risk for impaired skin integrity will decrease Outcome: Progressing   Problem: Education: Goal: Knowledge of the prescribed therapeutic regimen will improve Outcome: Progressing Goal: Individualized Educational Video(s) Outcome: Progressing   Problem: Activity: Goal: Ability to avoid complications of mobility impairment will improve Outcome: Progressing Goal: Range of joint motion will improve Outcome: Progressing   Problem: Clinical Measurements: Goal:  Postoperative complications will be avoided or minimized Outcome: Progressing   Problem: Pain Management: Goal: Pain level will decrease with appropriate interventions Outcome: Progressing   Problem: Skin Integrity: Goal: Will show signs of wound healing Outcome: Progressing

## 2023-04-08 NOTE — Progress Notes (Signed)
PT Cancellation Note  Patient Details Name: Avraj Schlabach MRN: [id number] DOB: 17-Jul-1945   Cancelled Treatment:     PT returned for afternoon session in hopes to advance gait and overall safe functional mobility. Pt very lethargic this afternoon. Too lethargic to safely attempt OOB activity. Author will personally return tomorrow at 730 am and feels lethargy is medication related.     Rushie Chestnut 04/08/2023, 4:32 PM

## 2023-04-08 NOTE — Evaluation (Addendum)
Clinical/Bedside Swallow Evaluation Patient Details  Name: Brian Kramer MRN: [id number] Date of Birth: May 31, 1946  Today's Date: 04/08/2023 Time: SLP Start Time (ACUTE ONLY): 0850 SLP Stop Time (ACUTE ONLY): 0950 SLP Time Calculation (min) (ACUTE ONLY): 60 min  Past Medical History:  Past Medical History:  Diagnosis Date   Anemia    Anginal pain (HCC)    Anxiety    Arthritis    Bilateral renal cysts    CKD (chronic kidney disease), stage III (HCC)    COPD (chronic obstructive pulmonary disease) (HCC)    Coronary artery disease    a.) MI 03/1995 - no PCI; b.) LHC/PCI 1997 - stent (unknown type) x 1 to RCA; c.) LHC/PCI 2000 - stent (unknown type) x 1 to LCx; d.) LHC 04/02/2010 - insig CAD with widely patent stents; medical mgmt; e.) LHC 04/30/2015 - patent stents; insig CAD; medical mgmt; f.) R/LHC 05/01/20: EF 60%, norm cors.   Depression    Diastolic dysfunction 09/05/2021   a.)  TTE 09/05/2021: EF 50%, mild BAE, trivial PR, mild MR/TR; pHTN (RVSP 50 mmHg), G1DD.   Essential hypertension 04/08/2021   GERD (gastroesophageal reflux disease)    a.) improved s/p sleeve gastrectomy; no daily Tx   History of hiatal hernia 2004   a.) s/p repair   History of obstructive sleep apnea    a.) resolved s/p sleeve gastrectomy; no longer requires nocturnal PAP therapy   HLD (hyperlipidemia)    Incomplete right bundle branch block (RBBB)    Insomnia    Lewy body dementia (HCC)    Long term current use of aspirin    Loosening of knee joint prosthesis (HCC)    Migraines    Myocardial infarction (HCC) 03/1995   a.) full details unknown; LHC performed, however no PCI performed.   On supplemental oxygen by nasal cannula    a.) 2L/[place] PRN   PAD (peripheral artery disease) (HCC) 04/08/2021   Peripheral neuropathy    Pneumonia    Presbycusis    PUD (peptic ulcer disease)    Pulmonary hypertension (HCC)    a.) takes ambrisentan; b.)  R/LHC 05/01/2020: EF 60%, mean PA 30, mean PCWP 16,  RVEDP 19, Ao sat 95, PA sat 74, CO 8.2, PVR 137, SVR 736; c.)  TTE 09/05/2021: EF 50%, RVSP 50 mmHg.   RLS (restless legs syndrome)    a.) takes ropinirole   SOB (shortness of breath) 06/28/2022   Thrombocytopenia (HCC)    Tremor    Type 2 diabetes, diet controlled [facility])    Past Surgical History:  Past Surgical History:  Procedure Laterality Date   AMPUTATION TOE Right 12/20/2021   Procedure: AMPUTATION TOE - SECOND AND THIRD - 28825;  Surgeon: Linus Galas, DPM;  Location: [facility] ORS;  Service: Podiatry;  Laterality: Right;   CARPOMETACARPAL ([facility]) FUSION OF THUMB Right 04/08/2018   Procedure: CARPOMETACARPAL [facility]) FUSION OF THUMB;  Surgeon: Kennedy Bucker, MD;  Location: [facility] OR[facility];  Service: Orthopedics;  Laterality: Right;   CARPOMETACARPAL ([facility]) FUSION OF THUMB Left 12/02/2018   Procedure: CARPOMETACARPAL ([facility]) FUSION OF  LEFT THUMB, DIABETIC;  Surgeon: Kennedy Bucker, MD;  Location: [facility] OR[facility];  Service: Orthopedics;  Laterality: Left;   CATARACT EXTRACTION Bilateral 2015   COLONOSCOPY     COLONOSCOPY WITH PROPOFOL N/A 05/19/2018   Procedure: COLONOSCOPY WITH PROPOFOL;  Surgeon: Toledo, Boykin Nearing, MD;  Location: [facility]OS[facility];  Service: Gastroenterology;  Laterality: N/A;   CORONARY ANGIOPLASTY WITH STENT PLACEMENT Left 1997   Procedure: CORONARY ANGIOPLASTY  WITH STENT PLACEMENT; stent x1 (unknown type) to RCA   CORONARY ANGIOPLASTY WITH STENT PLACEMENT Left 2000   Procedure: CORONARY ANGIOPLASTY WITH STENT PLACEMENT; stent x1 (unknown type) to LCx   EXCISION PARTIAL PHALANX Left 10/04/2021   Procedure: EXCISION PARTIAL PHALANX - 2, 3, 4;  Surgeon: Linus Galas, DPM;  Location: [facility] OR[facility];  Service: Podiatry;  Laterality: Left;   EYE SURGERY     cataract bilateral with lens   FLEXOR TENOTOMY  Right 12/20/2021   Procedure: FLEXOR TENOTOMY - FOURTH TOE - 14782;  Surgeon: Linus Galas, DPM;  Location: [facility] OR[facility];  Service: Podiatry;  Laterality: Right;   HAMMER TOE SURGERY Right 01/15/2018    Procedure: HAMMER TOE CORRECTION-2ND & 3RD;  Surgeon: Recardo Evangelist, DPM;  Location: [facility];  Service: Podiatry;  Laterality: Right;   HEEL SPUR RESECTION  2012   INGUINAL HERNIA REPAIR Bilateral    KNEE ARTHROSCOPY Right 2005   KNEE ARTHROSCOPY Left 2006   LAPAROSCOPIC GASTRIC SLEEVE RESECTION N/A 03/17/2016   LEFT HEART CATH AND CORONARY ANGIOGRAPHY Left 04/02/2010   Procedure: LEFT HEART CATH AND CORONARY ANGIOGRAPHY; Location: [facility]; Surgeon: Phil Dopp, MD   LEFT HEART CATH AND CORONARY ANGIOGRAPHY Left 04/30/2015   Procedure: LEFT HEART CATH AND CORONARY ANGIOGRAPHY; Location: [facility]; Surgeon: Aletha Halim, MD   RIGHT AND LEFT HEART CATH Left 05/01/2020   Procedure: RIGHT AND LEFT HEART CATH;  Surgeon: Alwyn Pea, MD;  Location: [facility];  Service: Cardiovascular;  Laterality: Left;   Right shoulder AC separation surgery in 1995 Right    Stem Cells to left heel  2017   TONSILLECTOMY     TOTAL KNEE ARTHROPLASTY Left 2007   TOTAL KNEE ARTHROPLASTY Right 2015   TOTAL KNEE REVISION Left 04/06/2023   Procedure: Left total knee revision, both components;  Surgeon: Reinaldo Berber, MD;  Location: [facility];  Service: Orthopedics;  Laterality: Left;   ULNAR TUNNEL RELEASE Left 04/14/2019   Procedure: LEFT CUBITAL TUNNEL RELEASE;  Surgeon: Kennedy Bucker, MD;  Location: [facility];  Service: Orthopedics;  Laterality: Left;   UPPER GI ENDOSCOPY  2017   VASECTOMY     1970s   WEIL OSTEOTOMY Right 01/15/2018   Procedure: WEIL OSTEOTOMY-2ND & 3RD;  Surgeon: Recardo Evangelist, DPM;  Location: [facility];  Service: Podiatry;  Laterality: Right;   HPI:  Pt is an 77 y.o. male admitted for left total knee revision with Dr. Audelia Acton on 04/06/2023. Patient had his left total knee replaced in 2004-2006 and his knee was doing well for years over the last 6-10 months he has had sharp stabs of pain over his anterior tibia that are severe and debilitating and increasing.  Pt has PMH of COPD,  CAD, MI, HTN, GERD, sleep apnea, anxiety, depression, DM. Dementia - potential Lewy Body per Neurology Outpt, Gastric Bypass surgery ~6-7 years ago.  Per family report, pt requires support w/ ADLs at home and does not drive anymore.  Per chart notes, pt was being seen by Outpt ST services for [facility] in 2020.   CXR: Prominent bilateral interstitial opacities could represent pulmonary  venous congestion or atypical infection.    Assessment / Plan / Recommendation  Clinical Impression   Pt seen for BSE this morning. Pt awake, sitting in chair post PT session. Wife and Dtr present. Pt talkative; confusion noted intermittently in his engagements. Pt has a Baseline dx of Lewy Body Dementia, per Outpt Neurology notes/dx. 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  On RA; afebrile. WBC 10.9  Pt appears to present w/ grossly functional oropharyngeal phase swallow w/ No overt oropharyngeal phase dysphagia noted, No neuromuscular deficits noted. Pt consumed po's w/ No immediate, overt clinical s/s of aspiration during po trials.  Pt appears at reduced risk for aspiration when following general aspiration precautions w/ a slightly modified diet for ease of gumming/chewing during the oral phase secondary to Edentulous status often.  However, pt does have challenging factors that could impact oropharyngeal swallowing to include Pain/discomfort in Left knee/leg(NSG aware), deconditioning/weakness, Edentulous status, Esophageal phase Dysmotility s/p Bariatric Surgery ~6-7 yrs ago per pt/Family, and Baseline Dementia -- Lewy Body Dementia per Outpatient Neurology notes in chart. These factors can increase risk for aspiration, dysphagia as well as decreased oral intake overall.   During po trials, pt consumed all consistencies w/ no overt coughing, decline in vocal quality, or change in respiratory presentation  during/post trials. O2 sats remained in mid-upper 90s when checked. Oral phase appeared grossly [facility] w/ timely bolus management and control of bolus propulsion for A-P transfer for swallowing. Min increased Time required for full gumming/mashing of increased textured, soft-solid trials(moistened foods) secondary to his Edentulous status. Good bolus manipulation noted. Oral clearing achieved w/ all trial consistencies.  OM Exam appeared [facility] w/ no unilateral weakness noted. Speech Clear. Pt fed self w/ setup support.   Recommend a more Mech Soft consistency diet w/ well-Cut meats/foods(chop/mash w/ a fork to break down if needed), moistened foods; Thin liquids. NO straws; pt should Hold Cup when drinking. Recommend general aspiration precautions, Reduce distraction and Talking during oral intake/meals. Rest Breaks as needed during meals to maintain calm breathing. Choose foods moist and easy to manage orally. REFLUX PRECAUTIONS d/t c/o Esophageal phase Dysmotility and Bariatric Surgery per report. Tray setup at meal and support sitting up. Pills WHOLE vs CRUSHED in Puree for safer, easier swallowing now and for D/C home.  Education given on Pills in Puree; food consistencies and easy to eat options; general aspiration and REFLUX precautions to pt and Wife/Dtr. No further skilled ST services indicated currently as pt appears at/near his Baseline. MD/NSG to reconsult if any new needs arise while admitted. NSG updated, agreed. MD updated. Handouts given to Wife and Dtr. Recommend Dietician f/u for support. SLP Visit Diagnosis: Dysphagia, unspecified (R13.10) (Edentulous and Cognitive decline at Baseline)    Aspiration Risk   (reduced following general aspiration precautions)    Diet Recommendation   Thin;Dysphagia 3 (mechanical soft) = a more Mech Soft consistency diet w/ well-Cut meats/foods(chop/mash w/ a fork to break down if needed), moistened foods; Thin liquids. NO straws; pt should Hold Cup when drinking.  Recommend general aspiration precautions, Reduce distraction and Talking during oral intake/meals. Rest Breaks as needed during meals to maintain calm breathing. Choose foods moist and easy to manage orally. REFLUX PRECAUTIONS d/t c/o Esophageal phase Dysmotility and Bariatric Surgery per report. Tray setup at meal and support sitting up.   Medication Administration: Whole meds with puree (vs CRUSHED in Puree)    Other  Recommendations Recommended Consults: Consider esophageal assessment;Consider GI evaluation (as indicated; Dietician f/u) Oral Care Recommendations: Oral care BID;Oral care before and after PO;Patient independent with oral care (setup support)    Recommendations for follow up therapy are one component of a multi-disciplinary discharge planning process, led by the attending physician.  Recommendations may be updated based on patient status, additional functional  criteria and insurance authorization.  Follow up Recommendations No SLP follow up      Assistance Recommended at Discharge  Intermittent-full d/t Cognitive decline baseline  Functional Status Assessment Patient has had a recent decline in their functional status and demonstrates the ability to make significant improvements in function in a reasonable and predictable amount of time.  Frequency and Duration  (n/a)   (n/a)       Prognosis Prognosis for improved oropharyngeal function: Fair (-Good) Barriers to Reach Goals: Cognitive deficits;Time post onset;Severity of deficits;Behavior Barriers/Prognosis Comment: baseline Dementia dx'd; Esophageal phase dysmotility reported; Edentulous      Swallow Study   General Date of Onset: 04/06/23 HPI: Pt is an 77 y.o. male admitted for left total knee revision with Dr. Audelia Acton on 04/06/2023. Patient had his left total knee replaced in 2004-2006 and his knee was doing well for years over the last 6-10 months he has had sharp stabs of pain over his anterior tibia that are severe  and debilitating and increasing.  Pt has PMH of COPD, CAD, MI, HTN, GERD, sleep apnea, anxiety, depression, DM. Dementia - potential Lewy Body per Neurology Outpt, Gastric Bypass surgery ~6-7 years ago.  Per family report, pt requires support w/ ADLs at home and does not drive anymore.  Per chart notes, pt was being seen by Outpt ST services for [facility] in 2020.   CXR: Prominent bilateral interstitial opacities could represent pulmonary  venous congestion or atypical infection. Type of Study: Bedside Swallow Evaluation Previous Swallow Assessment: none Diet Prior to this Study: NPO (regular diet at home) Temperature Spikes Noted: No (wbc 10.9) Respiratory Status: Room air History of Recent Intubation: No Behavior/Cognition: Alert;Cooperative;Pleasant mood;Confused;Distractible;Requires cueing (baseline Dementia per Neuro notes) Oral Cavity Assessment: Within Functional Limits Oral Care Completed by SLP: Yes Oral Cavity - Dentition: Edentulous (does not often wear the dentures) Vision: Functional for self-feeding Self-Feeding Abilities: Able to feed self;Needs assist;Needs set up Patient Positioning: Upright in chair Baseline Vocal Quality: Normal Volitional Cough: Strong Volitional Swallow: Able to elicit    Oral/Motor/Sensory Function Overall Oral Motor/Sensory Function: Within functional limits   Ice Chips Ice chips: Within functional limits Presentation: Spoon (fed; 2 trials)   Thin Liquid Thin Liquid: Within functional limits Presentation: Cup;Self Fed (10+ trials) Other Comments: single sips; rest breaks    Nectar Thick Nectar Thick Liquid: Not tested   Honey Thick Honey Thick Liquid: Not tested   Puree Puree: Within functional limits Presentation: Self Fed;Spoon (~3-4 ozs)   Solid     Solid: Impaired (edentulous) Presentation: Self Fed;Spoon (8+) Oral Phase Impairments: Impaired mastication (edentulous) Oral Phase Functional Implications:  (lengthier time) Pharyngeal Phase  Impairments:  (none) Other Comments: rest breaks        Jerilynn Som, MS, CCC-SLP Speech Language Pathologist Rehab Services; [facility] [phone] (ascom) Teighlor Korson 04/08/2023,2:05 PM

## 2023-04-08 NOTE — Plan of Care (Signed)

## 2023-04-08 NOTE — Progress Notes (Signed)
Physical Therapy Treatment Patient Details Name: Brian Kramer MRN: [id number] DOB: 1946-04-24 Today's Date: 04/08/2023   History of Present Illness Pt is a 77 yo male s/p L TKA revision. PMH of COPD, CAD, MI, HTN, anxiety, depression, DM.    PT Comments  Pt is alert, long sitting in bed, upon arrival. He is alert and agreeable however does present with cognition concerns. Supportive spouse and daughter redirecting pt throughout limited session. Pt seen 2 x this morning 2/2 to SLP arriving for swallow study. Pt was able to exit bed, stand, and take a few shuffling-antalgic steps to recliner. Pt moved much better previous session/date and author today's limited abilities are due to increased post op pain. Will return after SLP study to progress ambulation and OOB activity.  DC recs remain appropriate if pt can demonstrate improved activity tolerance throughout the day.   If plan is discharge home, recommend the following: A little help with bathing/dressing/bathroom;Assistance with cooking/housework;Assist for transportation;Help with stairs or ramp for entrance;Direct supervision/assist for medications management     Equipment Recommendations  Rolling walker (2 wheels);BSC/3in1       Precautions / Restrictions Precautions Precautions: Fall;Knee Precaution Booklet Issued: Yes (comment) Restrictions Weight Bearing Restrictions: Yes LLE Weight Bearing: Weight bearing as tolerated     Mobility  Bed Mobility Overal bed mobility: Needs Assistance Bed Mobility: Supine to Sit  Supine to sit: Mod assist, Max assist, Used rails  General bed mobility comments: Pt required much more assistance to exit bed today versus previous date. Pain limiting throughout session with max vcs for safety and sequencing. pt does not sleep in a bed at home. He sleeps in lift chair.    Transfers Overall transfer level: Needs assistance Equipment used: Rolling walker (2 wheels) Transfers: Sit to/from  Stand, Bed to chair/wheelchair/BSC Sit to Stand: Mod assist  General transfer comment: pt was unable to stand from lowest bed height. mod assist to stand from elevated surface. speech arrived for swollowing study. pt only able to tolerate a few antalgic shuffling steps to recliner. Author will retunr this morning to advance gait/OOB activity after SLP study.    Ambulation/Gait Ambulation/Gait assistance: Min assist, Mod assist Gait Distance (Feet): 3 Feet Assistive device: Rolling walker (2 wheels) Gait Pattern/deviations: Step-to pattern, Shuffle, Antalgic, Trunk flexed Gait velocity: decreased   General Gait Details: Pt struggles to clear floor to advance to taking steps. Very pain limited overall.   Balance Overall balance assessment: Needs assistance Sitting-balance support: Feet supported Sitting balance-Leahy Scale: Fair Sitting balance - Comments: severe lateral lean away form operative LE   Standing balance support: Bilateral upper extremity supported, During functional activity Standing balance-Leahy Scale: Poor Standing balance comment: reliant on RW for all standing task         Cognition Arousal: Alert Behavior During Therapy: WFL for tasks assessed/performed Overall Cognitive Status: Within Functional Limits for tasks assessed    General Comments: Pt is A however has some cognition deficits com tolight throughout session. reliant on spouse/daughter for reorientation and for staying focused on desired task requested.           General Comments General comments (skin integrity, edema, etc.): issued HEP handout. will review in future sessions      Pertinent Vitals/Pain Pain Assessment Pain Assessment: 0-10 Pain Score: 8  Pain Location: L knee Pain Descriptors / Indicators: Aching, Sore Pain Intervention(s): Limited activity within patient's tolerance, Monitored during session, Premedicated before session, Repositioned, Ice applied     PT Goals (current  goals  can now be found in the care plan section) Acute Rehab PT Goals Patient Stated Goal: to go home Progress towards PT goals: Not progressing toward goals - comment (pain limited)    Frequency    BID       AM-PAC PT "6 Clicks" Mobility   Outcome Measure  Help needed turning from your back to your side while in a flat bed without using bedrails?: A Little Help needed moving from lying on your back to sitting on the side of a flat bed without using bedrails?: A Lot Help needed moving to and from a bed to a chair (including a wheelchair)?: A Lot Help needed standing up from a chair using your arms (e.g., wheelchair or bedside chair)?: A Lot Help needed to walk in hospital room?: A Lot Help needed climbing 3-5 steps with a railing? : A Lot 6 Click Score: 13    End of Session Equipment Utilized During Treatment: Gait belt Activity Tolerance: Patient limited by pain Patient left: in chair;with call bell/phone within reach;with chair alarm set;with family/visitor present;with SCD's reapplied Nurse Communication: Mobility status PT Visit Diagnosis: Other abnormalities of gait and mobility (R26.89);Muscle weakness (generalized) (M62.81);Pain;Difficulty in walking, not elsewhere classified (R26.2) Pain - Right/Left: Left Pain - part of body: Knee     Time: 7829-5621 PT Time Calculation (min) (ACUTE ONLY): 19 min  Charges:    $Therapeutic Activity: 8-22 mins PT General Charges $$ ACUTE PT VISIT: 1 Visit                     Jetta Lout PTA 04/08/23, 10:44 AM

## 2023-04-08 NOTE — Progress Notes (Signed)
PROGRESS NOTE    Brian Kramer  WUJ:[id number] DOB: [birth year] DOA: 04/06/2023 PCP: Marisue Ivan, MD   Brief Narrative: 77 year old with past medical history significant for hypertension, pulmonary hypertension, depression, hyperlipidemia, neuropathy, moderate COPD, PAD, presented for a left total knee revision.  Patient developed postop hyperkalemia, AKI, dehydration, dysphagia, or hospitalist consulted for assistance.      Assessment & Plan:   Principal Problem:   S/P revision of total knee, left Active Problems:   Hyperkalemia   PAD (peripheral artery disease) (HCC)   Essential hypertension   CAD (coronary artery disease)   Hyperlipidemia   COPD, moderate (HCC)   Mild pulmonary hypertension (HCC)   Moderate major depression (HCC)   AKI (acute kidney injury) (HCC)   Oral candidosis   Coughing   1-s/p revision of total knee: Management per orthopedic Patient to be discharged with home health Continue with Vicodin for pain management HH   2-Hyperkalemia in the setting of AKI Received IV fluids Resolved   3-Cough: PNA Chest x-ray: Prominent bilateral interstitial opacity could represent pulmonary venous congestion or atypical infection Started on azithromycin to cover for infection  4-Candidiasis: Transition to oral fluconazole.  5-Dysphagia; evaluated by speech. Started on Dysphagia 3 diet.   Restless leg syndrome:  on Requip.    Estimated body mass index is 38.97 kg/m as calculated from the following:   Height as of this encounter: 5\' 9"  (1.753 m).   Weight as of this encounter: 119.7 kg.   DVT prophylaxis: Lovenox Code Status: Full code Family Communication: Care discussed with family who are at bedside Disposition Plan:  Status is: Inpatient Remains inpatient appropriate because: Home tomorrow if he is able to tolerate dysphagia diet    Antimicrobials:    Subjective: He is feeling better, couldn't sleep last night due to restless  leg and pain.   Objective: Vitals:   04/07/23 0802 04/07/23 1451 04/08/23 0045 04/08/23 0809  BP: (!) 125/53 (!) 129/53 (!) 123/41 (!) 140/61  Pulse: 61 63 86 91  Resp: 20 18 16 16   Temp: 97.8 F (36.6 C) 97.7 F (36.5 C) 100 F (37.8 C) 99.2 F (37.3 C)  TempSrc:  Oral    SpO2: 100% 93% 100% 97%  Weight:      Height:        Intake/Output Summary (Last 24 hours) at 04/08/2023 0820 Last data filed at 04/08/2023 9562 Gross per 24 hour  Intake 2176.13 ml  Output 1400 ml  Net 776.13 ml   Filed Weights   04/06/23 1200  Weight: 119.7 kg    Examination:  General exam: Appears calm and comfortable  Respiratory system: Clear to auscultation. Respiratory effort normal. Cardiovascular system: S1 & S2 heard, RRR. No JVD, murmurs, rubs, gallops or clicks. No pedal edema. Gastrointestinal system: Abdomen is nondistended, soft and nontender. No organomegaly or masses felt. Normal bowel sounds heard. Central nervous system: Alert and oriented. No focal neurological deficits. Extremities:  Left Knee with dressing.    Data Reviewed: I have personally reviewed following labs and imaging studies  CBC: Recent Labs  Lab 04/06/23 1131 04/07/23 0439 04/08/23 0505  WBC  --  5.7 10.9*  HGB 11.2* 9.2* 10.0*  HCT 33.0* 28.2* 30.0*  MCV  --  92.8 92.6  PLT  --  100* 134*   Basic Metabolic Panel: Recent Labs  Lab 04/06/23 1131 04/07/23 0439 04/07/23 0702 04/07/23 1417 04/08/23 0505  NA 140 137 138 140 139  K 5.5* 6.7* 6.0* 5.6* 4.8  CL 107 109 111 110 110  CO2  --  25 23 23  21*  GLUCOSE 99 130* 107* 109* 119*  BUN 28* 29* 28* 26* 22  CREATININE 1.50* 1.34* 1.33* 1.35* 1.15  CALCIUM  --  8.2* 8.2* 8.8* 8.5*  MG  --   --   --  2.2  --    GFR: Estimated Creatinine Clearance: 67.6 mL/min (by C-G formula based on SCr of 1.15 mg/dL). Liver Function Tests: Recent Labs  Lab 04/07/23 0111  AST 24  ALT 13  ALKPHOS 58  BILITOT 0.6  PROT 7.1  ALBUMIN 3.8   No results for  input(s): "LIPASE", "AMYLASE" in the last 168 hours. No results for input(s): "AMMONIA" in the last 168 hours. Coagulation Profile: No results for input(s): "INR", "PROTIME" in the last 168 hours. Cardiac Enzymes: Recent Labs  Lab 04/07/23 1417  CKTOTAL 240   BNP (last 3 results) No results for input(s): "PROBNP" in the last 8760 hours. HbA1C: No results for input(s): "HGBA1C" in the last 72 hours. CBG: Recent Labs  Lab 04/06/23 1719  GLUCAP 150*   Lipid Profile: No results for input(s): "CHOL", "HDL", "LDLCALC", "TRIG", "CHOLHDL", "LDLDIRECT" in the last 72 hours. Thyroid Function Tests: No results for input(s): "TSH", "T4TOTAL", "FREET4", "T3FREE", "THYROIDAB" in the last 72 hours. Anemia Panel: No results for input(s): "VITAMINB12", "FOLATE", "FERRITIN", "TIBC", "IRON", "RETICCTPCT" in the last 72 hours. Sepsis Labs: Recent Labs  Lab 04/07/23 1417 04/08/23 0505  PROCALCITON <0.10 0.14    Recent Results (from the past 240 hour(s))  Aerobic/Anaerobic Culture w Gram Stain (surgical/deep wound)     Status: None (Preliminary result)   Collection Time: 04/06/23  2:29 PM   Specimen: Path Tissue  Result Value Ref Range Status   Specimen Description TISSUE LEFT KNEE  Final   Special Requests FEMORAL SYNOVIUM PT ON ANCEF  Final   Gram Stain NO WBC SEEN NO ORGANISMS SEEN   Final   Culture   Final    NO GROWTH < 12 HOURS Performed at [facility], [address] [place], [place] [address]    Report Status PENDING  Incomplete  Aerobic/Anaerobic Culture w Gram Stain (surgical/deep wound)     Status: None (Preliminary result)   Collection Time: 04/06/23  2:30 PM   Specimen: Path Tissue  Result Value Ref Range Status   Specimen Description TISSUE LEFT KNEE  Final   Special Requests TIBIAL SYNOVIUM PT ON ANCEF  Final   Gram Stain NO WBC SEEN NO ORGANISMS SEEN   Final   Culture   Final    NO GROWTH < 12 HOURS Performed at [facility], [address],  [place], [place] [address]    Report Status PENDING  Incomplete  Aerobic/Anaerobic Culture w Gram Stain (surgical/deep wound)     Status: None (Preliminary result)   Collection Time: 04/06/23  3:05 PM   Specimen: Path Tissue  Result Value Ref Range Status   Specimen Description   Final    TISSUE Performed at [facility], [address] [place], [place] [address]    Special Requests LEFT KNEE CULTURE FIBULA SWAB  Final   Gram Stain   Final    ABUNDANT WBC PRESENT,BOTH PMN AND MONONUCLEAR NO ORGANISMS SEEN    Culture   Final    NO GROWTH < 12 HOURS Performed at [facility], [address], [place], [place] [address]    Report Status PENDING  Incomplete  Aerobic/Anaerobic  Culture w Gram Stain (surgical/deep wound)     Status: None (Preliminary result)   Collection Time: 04/06/23  3:06 PM   Specimen: Path Tissue  Result Value Ref Range Status   Specimen Description   Final    TISSUE Performed at [facility], [address] [place], [place] [address]    Special Requests LEFT KNEE CULTURE TIBIA SWAB  Final   Gram Stain NO WBC SEEN NO ORGANISMS SEEN   Final   Culture   Final    NO GROWTH < 12 HOURS Performed at [facility], [address] [place], [place] [address]    Report Status PENDING  Incomplete         Radiology Studies: DG Chest 2 View  Result Date: 04/07/2023 CLINICAL DATA:  cough, productive cough EXAM: CHEST - 2 VIEW COMPARISON:  CXR 06/25/22 FINDINGS: No pleural effusion. No pneumothorax. Unchanged cardiac and mediastinal contours. No focal airspace opacity. No radiographically apparent displaced rib fractures. There are prominent bilateral interstitial opacities which could represent pulmonary venous congestion or atypical infection. Vertebral body heights are maintained. IMPRESSION: Prominent bilateral interstitial opacities could represent pulmonary venous congestion or atypical infection. Electronically Signed   By: Lorenza Cambridge M.D.   On: 04/07/2023 12:48   DG Knee 1-2 Views Left  Result Date: 04/06/2023 CLINICAL DATA:  Status post total left knee arthroplasty revision. EXAM: LEFT KNEE - 1-2 VIEW COMPARISON:  None Available. FINDINGS: Status post total left knee arthroplasty with moderate long femoral and tibial stems. No perihardware lucency is seen to indicate hardware failure or loosening. Expected postoperative changes including moderate joint effusion and intra-articular air. Mild anterior subcutaneous air. No acute fracture or dislocation. IMPRESSION: Status post total left knee arthroplasty revision without evidence of hardware failure. Electronically Signed   By: Neita Garnet M.D.   On: 04/06/2023 19:16        Scheduled Meds:  acetaminophen  1,000 mg Oral Q8H   ambrisentan  5 mg Oral Daily   aspirin EC  81 mg Oral QHS   docusate sodium  100 mg Oral BID   DULoxetine  90 mg Oral Daily   enoxaparin (LOVENOX) injection  30 mg Subcutaneous Q12H   ferrous sulfate  325 mg Oral Q breakfast   fluticasone furoate-vilanterol  1 puff Inhalation Daily   And   umeclidinium bromide  1 puff Inhalation Daily   pantoprazole  40 mg Oral Daily   pregabalin  200 mg Oral TID   QUEtiapine  25 mg Oral QHS   rOPINIRole  1 mg Oral BID   rOPINIRole  2 mg Oral Daily   rosuvastatin  5 mg Oral Daily   sodium zirconium cyclosilicate  10 g Oral TID   Continuous Infusions:  sodium chloride 125 mL/hr at 04/08/23 2130   acetaminophen 1,000 mg (04/08/23 0813)   fluconazole (DIFLUCAN) IV       LOS: 2 days    Time spent:  35 minutes    Kadence Mimbs A Vasily Fedewa, MD [facility]   If 7PM-7AM, please contact night-coverage [email or web address]  04/08/2023, 8:20 AM

## 2023-04-08 NOTE — TOC Progression Note (Signed)
Transition of Care [facility]) - Progression Note    Patient Details  Name: Brian Kramer MRN: [id number] Date of Birth: 1945/10/13  Transition of Care [facility]) CM/SW Contact  Marlowe Sax, RN Phone Number: 04/08/2023, 2:53 PM  Clinical Narrative:    Met with the patient and his wife and adult daughter in the room He has Oxygen at home and All of the DME he needs, he is set up with [facility] for [facility] services prior to surgery by surgeons office   Expected Discharge Plan: Home w Home Health Services Barriers to Discharge: No Barriers Identified  Expected Discharge Plan and Services   Discharge Planning Services: CM Consult   Living arrangements for the past 2 months: Single Family Home                 DME Arranged: N/A         HH Arranged: PT, OT HH Agency: [facility] Date HH Agency Contacted: 04/08/23 Time HH Agency Contacted: 1447 Representative spoke with at [facility] Agency: Barbara Cower   Social Determinants of Health (SDOH) Interventions SDOH Screenings   Food Insecurity: No Food Insecurity (12/19/2022)   Received from [facility]  Housing: Low Risk  (06/26/2022)  Transportation Needs: No Transportation Needs (12/19/2022)   Received from [facility]  Utilities: Not At Risk (12/19/2022)   Received from [facility]  Financial Resource Strain: Low Risk  (12/19/2022)   Received from [facility]  Tobacco Use: Medium Risk (04/07/2023)    Readmission Risk Interventions     No data to display

## 2023-04-09 DIAGNOSIS — Z96652 Presence of left artificial knee joint: Secondary | ICD-10-CM | POA: Diagnosis not present

## 2023-04-09 LAB — BASIC METABOLIC PANEL
Anion gap: 8 (ref 5–15)
BUN: 23 mg/dL (ref 8–23)
CO2: 21 mmol/L — ABNORMAL LOW (ref 22–32)
Calcium: 8.5 mg/dL — ABNORMAL LOW (ref 8.9–10.3)
Chloride: 109 mmol/L (ref 98–111)
Creatinine, Ser: 1.35 mg/dL — ABNORMAL HIGH (ref 0.61–1.24)
GFR, Estimated: 54 mL/min — ABNORMAL LOW (ref >60)
Glucose, Bld: 133 mg/dL — ABNORMAL HIGH (ref 70–99)
Potassium: 4.2 mmol/L (ref 3.5–5.1)
Sodium: 138 mmol/L (ref 135–145)

## 2023-04-09 LAB — SURGICAL PATHOLOGY

## 2023-04-09 MED ORDER — POLYETHYLENE GLYCOL 3350 17 G PO PACK: 17.0000 g | PACK | Freq: Every day | ORAL | 0 refills | Status: DC

## 2023-04-09 MED ORDER — LACTULOSE 10 GM/15ML PO SOLN
20.0000 g | Freq: Once | ORAL | Status: AC
  Administered 2023-04-09: 20 g via ORAL
  Filled 2023-04-09: qty 30

## 2023-04-09 MED ORDER — POLYETHYLENE GLYCOL 3350 17 G PO PACK
17.0000 g | PACK | Freq: Every day | ORAL | Status: DC
  Administered 2023-04-09: 17 g via ORAL
  Filled 2023-04-09: qty 1

## 2023-04-09 MED ORDER — BISACODYL 10 MG RE SUPP
10.0000 mg | Freq: Once | RECTAL | Status: AC
  Administered 2023-04-09: 10 mg via RECTAL
  Filled 2023-04-09: qty 1

## 2023-04-09 NOTE — Plan of Care (Signed)

## 2023-04-09 NOTE — TOC Transition Note (Signed)
Transition of Care [facility]) - CM/SW Discharge Note   Patient Details  Name: Brian Kramer MRN: [id number] Date of Birth: 08/22/1945  Transition of Care [facility]) CM/SW Contact:  Hetty Ely, RN Phone Number: 04/09/2023, 2:09 PM   Final next level of care: Home/Self Care Barriers to Discharge: Barriers Resolved   Patient Goals and CMS Choice        Patient and family notified of of transfer: 04/09/23  Discharge Plan and Services Additional resources added to the After Visit Summary for     Discharge Planning Services: CM Consult            DME Arranged: 3-N-1, Walker rolling DME Agency: [facility] Date DME Agency Contacted: 04/09/23 Time DME Agency Contacted: [phone] Representative spoke with at DME Agency: Osvaldo Angst Arranged: NA HH Agency: NA Date HH Agency Contacted: 04/08/23 Time HH Agency Contacted: 1447 Representative spoke with at [facility] Agency: Barbara Cower  Social Determinants of Health (SDOH) Interventions SDOH Screenings   Food Insecurity: No Food Insecurity (12/19/2022)   Received from [facility]  Housing: Low Risk  (06/26/2022)  Transportation Needs: No Transportation Needs (12/19/2022)   Received from [facility]  Utilities: Not At Risk (12/19/2022)   Received from [facility]  Financial Resource Strain: Low Risk  (12/19/2022)   Received from [facility]  Tobacco Use: Medium Risk (04/07/2023)     Readmission Risk Interventions     No data to display

## 2023-04-09 NOTE — Plan of Care (Signed)
  Problem: Education: Goal: Knowledge of General Education information will improve Description: Including pain rating scale, medication(s)/side effects and non-pharmacologic comfort measures Outcome: Progressing   Problem: Health Behavior/Discharge Planning: Goal: Ability to manage health-related needs will improve Outcome: Progressing   Problem: Clinical Measurements: Goal: Ability to maintain clinical measurements within normal limits will improve Outcome: Progressing Goal: Will remain free from infection Outcome: Progressing Goal: Diagnostic test results will improve Outcome: Progressing Goal: Respiratory complications will improve Outcome: Progressing Goal: Cardiovascular complication will be avoided Outcome: Progressing   Problem: Activity: Goal: Risk for activity intolerance will decrease Outcome: Progressing   Problem: Nutrition: Goal: Adequate nutrition will be maintained Outcome: Progressing   Problem: Coping: Goal: Level of anxiety will decrease Outcome: Progressing   Problem: Elimination: Goal: Will not experience complications related to bowel motility Outcome: Progressing Goal: Will not experience complications related to urinary retention Outcome: Progressing   Problem: Pain Management: Goal: General experience of comfort will improve Outcome: Progressing   Problem: Safety: Goal: Ability to remain free from injury will improve Outcome: Progressing   Problem: Skin Integrity: Goal: Risk for impaired skin integrity will decrease Outcome: Progressing   Problem: Education: Goal: Knowledge of the prescribed therapeutic regimen will improve Outcome: Progressing Goal: Individualized Educational Video(s) Outcome: Progressing   Problem: Activity: Goal: Ability to avoid complications of mobility impairment will improve Outcome: Progressing Goal: Range of joint motion will improve Outcome: Progressing   Problem: Clinical Measurements: Goal:  Postoperative complications will be avoided or minimized Outcome: Progressing   Problem: Pain Management: Goal: Pain level will decrease with appropriate interventions Outcome: Progressing   Problem: Skin Integrity: Goal: Will show signs of wound healing Outcome: Progressing

## 2023-04-09 NOTE — Progress Notes (Signed)
Subjective: 3 Days Post-Op Procedure(s) (LRB): Left total knee revision, both components (Left) Patient reports pain as mild.  Patient is well, and has had no acute complaints or problems Denies any CP, SOB, ABD pain.  No coughing We will continue therapy today.  Plan is to go Home after hospital stay.  Objective: Vital signs in last 24 hours: Temp:  [98.7 F (37.1 C)-99.2 F (37.3 C)] 98.7 F (37.1 C) (10/30 1831) Pulse Rate:  [87-91] 87 (10/30 1831) Resp:  [16] 16 (10/30 1831) BP: (117-140)/(54-61) 117/54 (10/30 1831) SpO2:  [97 %-100 %] 100 % (10/30 1831)  Intake/Output from previous day: 10/30 0701 - 10/31 0700 In: 1957.6 [P.O.:200; I.V.:1657.6; IV Piggyback:100] Out: 300 [Urine:300] Intake/Output this shift: No intake/output data recorded.  Recent Labs    04/06/23 1131 04/07/23 0439 04/08/23 0505  HGB 11.2* 9.2* 10.0*   Recent Labs    04/07/23 0439 04/08/23 0505  WBC 5.7 10.9*  RBC 3.04* 3.24*  HCT 28.2* 30.0*  PLT 100* 134*   Recent Labs    04/07/23 1417 04/08/23 0505  NA 140 139  K 5.6* 4.8  CL 110 110  CO2 23 21*  BUN 26* 22  CREATININE 1.35* 1.15  GLUCOSE 109* 119*  CALCIUM 8.8* 8.5*   No results for input(s): "LABPT", "INR" in the last 72 hours.  EXAM General - Patient is Alert, Appropriate, and Oriented Extremity - Neurovascular intact Sensation intact distally Intact pulses distally Dorsiflexion/Plantar flexion intact Dressing - dressing C/D/I and no drainage Motor Function - intact, moving foot and toes well on exam.   Past Medical History:  Diagnosis Date   Anemia    Anginal pain (HCC)    Anxiety    Arthritis    Bilateral renal cysts    CKD (chronic kidney disease), stage III (HCC)    COPD (chronic obstructive pulmonary disease) (HCC)    Coronary artery disease    a.) MI 03/1995 - no PCI; b.) LHC/PCI 1997 - stent (unknown type) x 1 to RCA; c.) LHC/PCI 2000 - stent (unknown type) x 1 to LCx; d.) LHC 04/02/2010 - insig CAD  with widely patent stents; medical mgmt; e.) LHC 04/30/2015 - patent stents; insig CAD; medical mgmt; f.) R/LHC 05/01/20: EF 60%, norm cors.   Depression    Diastolic dysfunction 09/05/2021   a.)  TTE 09/05/2021: EF 50%, mild BAE, trivial PR, mild MR/TR; pHTN (RVSP 50 mmHg), G1DD.   Essential hypertension 04/08/2021   GERD (gastroesophageal reflux disease)    a.) improved s/p sleeve gastrectomy; no daily Tx   History of hiatal hernia 2004   a.) s/p repair   History of obstructive sleep apnea    a.) resolved s/p sleeve gastrectomy; no longer requires nocturnal PAP therapy   HLD (hyperlipidemia)    Incomplete right bundle branch block (RBBB)    Insomnia    Lewy body dementia (HCC)    Long term current use of aspirin    Loosening of knee joint prosthesis (HCC)    Migraines    Myocardial infarction (HCC) 03/1995   a.) full details unknown; LHC performed, however no PCI performed.   On supplemental oxygen by nasal cannula    a.) 2L/[place] PRN   PAD (peripheral artery disease) (HCC) 04/08/2021   Peripheral neuropathy    Pneumonia    Presbycusis    PUD (peptic ulcer disease)    Pulmonary hypertension (HCC)    a.) takes ambrisentan; b.)  R/LHC 05/01/2020: EF 60%, mean PA 30, mean PCWP  16, RVEDP 19, Ao sat 95, PA sat 74, CO 8.2, PVR 137, SVR 736; c.)  TTE 09/05/2021: EF 50%, RVSP 50 mmHg.   RLS (restless legs syndrome)    a.) takes ropinirole   SOB (shortness of breath) 06/28/2022   Thrombocytopenia (HCC)    Tremor    Type 2 diabetes, diet controlled (HCC)     Assessment/Plan:   3 Days Post-Op Procedure(s) (LRB): Left total knee revision, both components (Left) Principal Problem:   S/P revision of total knee, left Active Problems:   PAD (peripheral artery disease) (HCC)   Essential hypertension   CAD (coronary artery disease)   Hyperlipidemia   COPD, moderate (HCC)   Mild pulmonary hypertension (HCC)   Moderate major depression (HCC)   AKI (acute kidney injury) (HCC)    Hyperkalemia   Oral candidosis   Coughing  Estimated body mass index is 38.97 kg/m as calculated from the following:   Height as of this encounter: 5\' 9"  (1.753 m).   Weight as of this encounter: 119.7 kg. Advance diet Up with therapy Doing well with no complaints.  Pain well controlled  Vital signs are stable, afebrile  Hyperkalemia-resolved   Acute kidney injury -kidney function back to baseline.   Hemoglobin stable 10.0  Oral candidiasis -continue with IV fluconazole per hospitalist.  Will complete p.o. course at discharge, 100 mg p.o. daily for total of 14 days.  Care management to assist with discharge.  Hopeful for discharge to home with home health PT today    DVT Prophylaxis - Lovenox, TED hose, and SCDs Weight-Bearing as tolerated to left leg   T. Cranston Neighbor, PA-C [facility] 04/09/2023, 7:43 AM

## 2023-04-09 NOTE — Progress Notes (Signed)
Physical Therapy Treatment Patient Details Name: Brian Kramer MRN: [id number] DOB: 1946/05/08 Today's Date: 04/09/2023   History of Present Illness Pt is a 77 yo male s/p L TKA revision. PMH of COPD, CAD, MI, HTN, anxiety, depression, DM.    PT Comments  Pt was long sitting in bed, alert and eager to participate in session. He endorses only 3-4/10 pain throughout session." It really doesn't hurt that bad." Pt required extensive assistance to exit bed however does not sleep in bed at home. Pt sleep in lift chair at baseline. He was able to stand from lowest bed height and from w/c height surface with min assist + vcs. Daughter will be assisting pt at home. Pt did ambulate 4 separate gait trials with 50 ' being the furthest. Limited more by fatigue than pain or strength.He demonstrated safe abilities to ascend/descend steps to simulate his home entry. Lengthy education and review of importance of ROM/HEP, car transfers, polar care use, and expectations of PT going forward.  Pt is progressing towards all rehab goals. He reports needing BSC and RW prior to Dcing. DC recs remain appropriate.     If plan is discharge home, recommend the following: A little help with bathing/dressing/bathroom;Assistance with cooking/housework;Assist for transportation;Help with stairs or ramp for entrance;Direct supervision/assist for medications management     Equipment Recommendations  Rolling walker (2 wheels);BSC/3in1       Precautions / Restrictions Precautions Precautions: Fall;Knee Precaution Booklet Issued: Yes (comment) Restrictions Weight Bearing Restrictions: Yes LLE Weight Bearing: Weight bearing as tolerated     Mobility  Bed Mobility Overal bed mobility: Needs Assistance Bed Mobility: Supine to Sit  Supine to sit: Min assist, Mod assist  General bed mobility comments: pt sleeps in lift chair at baseline    Transfers Overall transfer level: Needs assistance Equipment used: Rolling  walker (2 wheels) Transfers: Sit to/from Stand Sit to Stand: Min assist  General transfer comment: Pt has lift chair at home that he uses prior to surgery. He was able to stand form lowest bed height and form w/c height surface with min assist + vcs. pt issued gait belt for home use    Ambulation/Gait Ambulation/Gait assistance: Contact guard assist Gait Distance (Feet): 50 Feet Assistive device: Rolling walker (2 wheels) Gait Pattern/deviations: Step-to pattern, Antalgic, Trunk flexed, Decreased stance time - left, Decreased step length - right Gait velocity: decreased  General Gait Details: pt ambulated 4 seperate trials with 50 ft furthest. limited by fatigue. Pt only reports 3-4/10 pain throughout session. w/c follow for safety. pt pushed to rehab gym to perform stairs   Stairs Stairs: Yes Stairs assistance: Contact guard assist, Min assist Stair Management: No rails, Step to pattern, Backwards, With walker Number of Stairs: 1 General stair comments: pt performed 1 steps 3 x to simulate home entry. both pt's spouse and daughter present throughout training. Pt and family state confidence in performance and with abilities to safely enter exit home.    Balance Overall balance assessment: Needs assistance Sitting-balance support: Feet supported Sitting balance-Leahy Scale: Good     Standing balance support: Bilateral upper extremity supported, During functional activity Standing balance-Leahy Scale: Fair Standing balance comment: reliant on RW for all standing task. Heavy UE support required due to pain/weakness      Cognition Arousal: Alert Behavior During Therapy: WFL for tasks assessed/performed Overall Cognitive Status: Within Functional Limits for tasks assessed  General Comments: Pt is alert and cooperative. Motivated to do well so he can go home today.  Both pt's spouse and daughter present throughout session and will be assisting pt at DC 24/7           General  Comments General comments (skin integrity, edema, etc.): Author reviewed car transfers, need for RW/BSC, importance of routine mobility, polar care use, and HEP. Pt and family state understanding and in confidence for safe DC home today.      Pertinent Vitals/Pain Pain Assessment Pain Assessment: 0-10 Pain Score: 3  Pain Location: L knee Pain Descriptors / Indicators: Aching, Sore Pain Intervention(s): Limited activity within patient's tolerance, Monitored during session, Premedicated before session, Repositioned, Ice applied     PT Goals (current goals can now be found in the care plan section) Acute Rehab PT Goals Patient Stated Goal: go home Progress towards PT goals: Progressing toward goals    Frequency    BID       AM-PAC PT "6 Clicks" Mobility   Outcome Measure  Help needed turning from your back to your side while in a flat bed without using bedrails?: A Little Help needed moving from lying on your back to sitting on the side of a flat bed without using bedrails?: A Lot Help needed moving to and from a bed to a chair (including a wheelchair)?: A Little Help needed standing up from a chair using your arms (e.g., wheelchair or bedside chair)?: A Little Help needed to walk in hospital room?: A Little Help needed climbing 3-5 steps with a railing? : A Little 6 Click Score: 17    End of Session Equipment Utilized During Treatment: Gait belt Activity Tolerance: Patient tolerated treatment well;Patient limited by fatigue Patient left: in chair;with call bell/phone within reach;with chair alarm set;with family/visitor present;with SCD's reapplied Nurse Communication: Mobility status PT Visit Diagnosis: Other abnormalities of gait and mobility (R26.89);Muscle weakness (generalized) (M62.81);Pain;Difficulty in walking, not elsewhere classified (R26.2) Pain - Right/Left: Left Pain - part of body: Knee     Time: 0727-0833 PT Time Calculation (min) (ACUTE ONLY): 66  min  Charges:    $Gait Training: 23-37 mins $Therapeutic Exercise: 8-22 mins $Therapeutic Activity: 8-22 mins PT General Charges $$ ACUTE PT VISIT: 1 Visit                     Jetta Lout PTA 04/09/23, 8:47 AM

## 2023-04-09 NOTE — Plan of Care (Signed)
Patient discharged per MD orders at this time.All dc instructions,education and medications reviewed with the patient.Pt expressed understanding and will comply with dc instructions.follow up appointments was also communicated to the Pt.no verbal c/o or any ssx of distress.Pt was discharged home with HH/PT/OT services per order.Pt was transported home by daughter in a privately owned vehicle.

## 2023-04-09 NOTE — Care Management Important Message (Signed)
Important Message  Patient Details  Name: Brian Kramer MRN: [id number] Date of Birth: August 27, 1945   Important Message Given:  N/A - LOS <3 / Initial given by admissions     [name] 04/09/2023, 10:06 AM

## 2023-04-09 NOTE — Progress Notes (Signed)
PROGRESS NOTE    Brian Kramer  ONG:[id number] DOB: [birth year] DOA: 04/06/2023 PCP: Marisue Ivan, MD   Brief Narrative: 77 year old with past medical history significant for hypertension, pulmonary hypertension, depression, hyperlipidemia, neuropathy, moderate COPD, PAD, presented for a left total knee revision.  Patient developed postop hyperkalemia, AKI, dehydration, dysphagia, or hospitalist consulted for assistance.      Assessment & Plan:   Principal Problem:   S/P revision of total knee, left Active Problems:   Hyperkalemia   PAD (peripheral artery disease) (HCC)   Essential hypertension   CAD (coronary artery disease)   Hyperlipidemia   COPD, moderate (HCC)   Mild pulmonary hypertension (HCC)   Moderate major depression (HCC)   AKI (acute kidney injury) (HCC)   Oral candidosis   Coughing   1-s/p revision of total knee: Management per orthopedic Patient to be discharged with home health Continue with Vicodin for pain management HH   2-Hyperkalemia in the setting of AKI Received IV fluids Resolved If repeat labs today normal potassium ok to discharge home today.,   3-Cough: PNA Chest x-ray: Prominent bilateral interstitial opacity could represent pulmonary venous congestion or atypical infection Started on azithromycin to cover for infection. Plan to treat for total 5 days.   4-Candidiasis: Transition to oral fluconazole. Total 14 days.   5-Dysphagia; evaluated by speech. Started on Dysphagia 3 diet.  Tolerating diet.   Constipation; suppository today.  Home today after he has BM.   Restless leg syndrome:  on Requip.    Estimated body mass index is 38.97 kg/m as calculated from the following:   Height as of this encounter: 5\' 9"  (1.753 m).   Weight as of this encounter: 119.7 kg.   DVT prophylaxis: Lovenox Code Status: Full code Family Communication: Care discussed with family who are at bedside Disposition Plan:  Status is:  Inpatient Remains inpatient appropriate because: Home tomorrow if he is able to tolerate dysphagia diet    Antimicrobials:    Subjective: Feeling well, he was able to sleep last night. No BM since Sunday, doesn't think he has pass gas. His abdomen is not distended, non tender, positive Bowel sound. Plan for supp.   Objective: Vitals:   04/07/23 1451 04/08/23 0045 04/08/23 0809 04/08/23 1831  BP: (!) 129/53 (!) 123/41 (!) 140/61 (!) 117/54  Pulse: 63 86 91 87  Resp: 18 16 16 16   Temp: 97.7 F (36.5 C) 100 F (37.8 C) 99.2 F (37.3 C) 98.7 F (37.1 C)  TempSrc: Oral     SpO2: 93% 100% 97% 100%  Weight:      Height:        Intake/Output Summary (Last 24 hours) at 04/09/2023 0916 Last data filed at 04/08/2023 1900 Gross per 24 hour  Intake 1957.6 ml  Output 300 ml  Net 1657.6 ml   Filed Weights   04/06/23 1200  Weight: 119.7 kg    Examination:  General exam: NAD Respiratory system: CTA Cardiovascular system: S 1, S 2 RRR Gastrointestinal system: BS present, soft, nt, no distended.  Central nervous system: alert, follows command Extremities:  Left Knee with dressing.    Data Reviewed: I have personally reviewed following labs and imaging studies  CBC: Recent Labs  Lab 04/06/23 1131 04/07/23 0439 04/08/23 0505  WBC  --  5.7 10.9*  HGB 11.2* 9.2* 10.0*  HCT 33.0* 28.2* 30.0*  MCV  --  92.8 92.6  PLT  --  100* 134*   Basic Metabolic Panel: Recent Labs  Lab  04/06/23 1131 04/07/23 0439 04/07/23 0702 04/07/23 1417 04/08/23 0505  NA 140 137 138 140 139  K 5.5* 6.7* 6.0* 5.6* 4.8  CL 107 109 111 110 110  CO2  --  25 23 23  21*  GLUCOSE 99 130* 107* 109* 119*  BUN 28* 29* 28* 26* 22  CREATININE 1.50* 1.34* 1.33* 1.35* 1.15  CALCIUM  --  8.2* 8.2* 8.8* 8.5*  MG  --   --   --  2.2  --    GFR: Estimated Creatinine Clearance: 67.6 mL/min (by C-G formula based on SCr of 1.15 mg/dL). Liver Function Tests: Recent Labs  Lab 04/07/23 0111  AST 24  ALT  13  ALKPHOS 58  BILITOT 0.6  PROT 7.1  ALBUMIN 3.8   No results for input(s): "LIPASE", "AMYLASE" in the last 168 hours. No results for input(s): "AMMONIA" in the last 168 hours. Coagulation Profile: No results for input(s): "INR", "PROTIME" in the last 168 hours. Cardiac Enzymes: Recent Labs  Lab 04/07/23 1417  CKTOTAL 240   BNP (last 3 results) No results for input(s): "PROBNP" in the last 8760 hours. HbA1C: No results for input(s): "HGBA1C" in the last 72 hours. CBG: Recent Labs  Lab 04/06/23 1719  GLUCAP 150*   Lipid Profile: No results for input(s): "CHOL", "HDL", "LDLCALC", "TRIG", "CHOLHDL", "LDLDIRECT" in the last 72 hours. Thyroid Function Tests: No results for input(s): "TSH", "T4TOTAL", "FREET4", "T3FREE", "THYROIDAB" in the last 72 hours. Anemia Panel: No results for input(s): "VITAMINB12", "FOLATE", "FERRITIN", "TIBC", "IRON", "RETICCTPCT" in the last 72 hours. Sepsis Labs: Recent Labs  Lab 04/07/23 1417 04/08/23 0505  PROCALCITON <0.10 0.14    Recent Results (from the past 240 hour(s))  Aerobic/Anaerobic Culture w Gram Stain (surgical/deep wound)     Status: None (Preliminary result)   Collection Time: 04/06/23  2:29 PM   Specimen: Path Tissue  Result Value Ref Range Status   Specimen Description TISSUE LEFT KNEE  Final   Special Requests FEMORAL SYNOVIUM PT ON ANCEF  Final   Gram Stain NO WBC SEEN NO ORGANISMS SEEN   Final   Culture   Final    NO GROWTH 3 DAYS NO ANAEROBES ISOLATED; CULTURE IN PROGRESS FOR 5 DAYS Performed at [facility], [address] [place], [place] [address]    Report Status PENDING  Incomplete  Aerobic/Anaerobic Culture w Gram Stain (surgical/deep wound)     Status: None (Preliminary result)   Collection Time: 04/06/23  2:30 PM   Specimen: Path Tissue  Result Value Ref Range Status   Specimen Description TISSUE LEFT KNEE  Final   Special Requests TIBIAL SYNOVIUM PT ON ANCEF  Final   Gram Stain NO WBC SEEN NO  ORGANISMS SEEN   Final   Culture   Final    NO GROWTH 3 DAYS NO ANAEROBES ISOLATED; CULTURE IN PROGRESS FOR 5 DAYS Performed at [facility], [address], [place], [place] [address]    Report Status PENDING  Incomplete  Aerobic/Anaerobic Culture w Gram Stain (surgical/deep wound)     Status: None (Preliminary result)   Collection Time: 04/06/23  3:05 PM   Specimen: Path Tissue  Result Value Ref Range Status   Specimen Description   Final    TISSUE Performed at [facility], [address] [place], [place] [address]    Special Requests LEFT KNEE CULTURE FIBULA SWAB  Final   Gram Stain   Final    ABUNDANT WBC PRESENT,BOTH PMN AND MONONUCLEAR  NO ORGANISMS SEEN    Culture   Final    NO GROWTH 3 DAYS NO ANAEROBES ISOLATED; CULTURE IN PROGRESS FOR 5 DAYS Performed at [facility], [address], [place], [place] [address]    Report Status PENDING  Incomplete  Aerobic/Anaerobic Culture w Gram Stain (surgical/deep wound)     Status: None (Preliminary result)   Collection Time: 04/06/23  3:06 PM   Specimen: Path Tissue  Result Value Ref Range Status   Specimen Description   Final    TISSUE Performed at [facility], [address] [place], [place] [address]    Special Requests LEFT KNEE CULTURE TIBIA SWAB  Final   Gram Stain NO WBC SEEN NO ORGANISMS SEEN   Final   Culture   Final    NO GROWTH 3 DAYS NO ANAEROBES ISOLATED; CULTURE IN PROGRESS FOR 5 DAYS Performed at [facility], [address] [place], [place] [address]    Report Status PENDING  Incomplete         Radiology Studies: DG Chest 2 View  Result Date: 04/07/2023 CLINICAL DATA:  cough, productive cough EXAM: CHEST - 2 VIEW COMPARISON:  CXR 06/25/22 FINDINGS: No pleural effusion. No pneumothorax. Unchanged cardiac and mediastinal contours. No focal airspace opacity. No radiographically apparent displaced rib fractures. There are prominent bilateral interstitial opacities  which could represent pulmonary venous congestion or atypical infection. Vertebral body heights are maintained. IMPRESSION: Prominent bilateral interstitial opacities could represent pulmonary venous congestion or atypical infection. Electronically Signed   By: Lorenza Cambridge M.D.   On: 04/07/2023 12:48        Scheduled Meds:  acetaminophen  1,000 mg Oral Q8H   ambrisentan  5 mg Oral Daily   aspirin EC  81 mg Oral QHS   azithromycin  250 mg Oral Daily   docusate sodium  100 mg Oral BID   DULoxetine  90 mg Oral Daily   enoxaparin (LOVENOX) injection  30 mg Subcutaneous Q12H   ferrous sulfate  325 mg Oral Q breakfast   fluconazole  100 mg Oral Daily   fluticasone furoate-vilanterol  1 puff Inhalation Daily   And   umeclidinium bromide  1 puff Inhalation Daily   pantoprazole  40 mg Oral Daily   polyethylene glycol  17 g Oral Daily   pregabalin  200 mg Oral TID   QUEtiapine  25 mg Oral QHS   rOPINIRole  1 mg Oral BID   rOPINIRole  2 mg Oral Daily   rosuvastatin  5 mg Oral Daily   Continuous Infusions:  sodium chloride 75 mL/hr at 04/08/23 0824     LOS: 3 days    Time spent:  35 minutes    Annalena Piatt A Mckenzi Buonomo, MD [facility]   If 7PM-7AM, please contact night-coverage [email or web address]  04/09/2023, 9:16 AM

## 2023-04-11 LAB — AEROBIC/ANAEROBIC CULTURE W GRAM STAIN (SURGICAL/DEEP WOUND)
Culture: NO GROWTH
Culture: NO GROWTH
Culture: NO GROWTH
Culture: NO GROWTH
Gram Stain: NONE SEEN
Gram Stain: NONE SEEN
Gram Stain: NONE SEEN

## 2023-06-11 ENCOUNTER — Ambulatory Visit: Payer: Medicare PPO | Attending: Neurology

## 2023-06-11 DIAGNOSIS — R41841 Cognitive communication deficit: Secondary | ICD-10-CM | POA: Diagnosis present

## 2023-06-11 DIAGNOSIS — R413 Other amnesia: Secondary | ICD-10-CM | POA: Diagnosis present

## 2023-06-11 NOTE — Therapy (Signed)
 OUTPATIENT SPEECH LANGUAGE PATHOLOGY  EVALUATION   Patient Name: Brian Kramer MRN: [id number] DOB:[birth year], 78 y.o., male Today's Date: 06/11/2023  ERE:[email or web address] Alla, MD   REFERRING PROVIDER: Jannett Fairly, MD   End of Session - 06/11/23 1225     Visit Number 1    Number of Visits 24    Date for SLP Re-Evaluation 09/03/23    Authorization - Visit Number 1    Authorization - Number of Visits 10    SLP Start Time 1100    SLP Stop Time  1150    SLP Time Calculation (min) 50 min    Activity Tolerance Patient tolerated treatment well              Patient Active Problem List   Diagnosis Date Noted   Hyperkalemia 04/07/2023   Oral candidosis 04/07/2023   Coughing 04/07/2023   S/P revision of total knee, 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   SOB (shortness of breath) 06/28/2022   AKI (acute kidney injury) (HCC) 06/26/2022   COPD with acute exacerbation (HCC) 06/25/2022   Acute on chronic respiratory failure with hypoxia (HCC) 06/25/2022   Pneumonia due to COVID-19 virus 06/25/2022   Dementia without behavioral disturbance (HCC) 06/25/2022   Frailty 06/25/2022   COPD, moderate (HCC) 06/23/2022   Mild pulmonary hypertension (HCC) 01/02/2022   PAD (peripheral artery disease) (HCC) 04/08/2021   Essential hypertension 04/08/2021   CAD (coronary artery disease) 04/08/2021   Hyperlipidemia 04/08/2021   Leg pain 04/08/2021   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 Pt accompanied by: self, significant other, and family member  PERTINENT HISTORY: Pt is a 78 y.o. male presenting for a cognitive-communication evaluation in setting of dementia. Per Dr. Jamal note, pt with  cognitive impairments with visual hallucinations. 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 Pt with subjective reports of worsening memory and wordfinding/anomia.e details on [employer].  DIAGNOSTIC FINDINGS:  MRI brain 2021, 1. No acute intracranial abnormality. 2. Largely stable MRI appearance of the brain since 2019, with signal changes suspicious for chronic small vessel disease. But there is a new focus of microhemorrhage or mild superficial siderosis at the inferior left parietal lobe. 3. Stable small left anterior frontal bone lesion, favor benign hemangioma. 4. NeuroQuant volumetric analysis of the brain, se  PAIN:  Are you having pain? No   FALLS: Has patient fallen in last 6 months?  Yes  LIVING ENVIRONMENT: Lives with: lives with their spouse Lives in: House/apartment  PLOF:  Level of assistance: Needed assistance with ADLs Employment: Retired   PATIENT GOALS   to work on wordfinding  OBJECTIVE:   COGNITIVE COMMUNICATION: Overall cognitive status: Impaired Areas of impairment:  Attention: Impaired: Divided Memory: Impaired: Immediate Working Development Worker, Community comprehension: WFL Verbal expression: Impaired: divergent naming, conversationally   AUDITORY COMPREHENSION: Overall auditory comprehension: Appears intact   READING COMPREHENSION: DNT  EXPRESSION: verbal  VERBAL EXPRESSION: Level of generative/spontaneous verbalization: conversation Automatic speech: name: intact and social response: intact  Repetition: Appears intact Naming: Divergent: mildly impaired Pragmatics: Impaired: topic maintenance Interfering components: attention   WRITTEN EXPRESSION: Dominant hand: right   Written expression: Impaired: sentence due to legibility and spelling  errors  MOTOR SPEECH: Overall motor speech: Appears intact  ORAL MOTOR  EXAMINATION: WFL  STANDARDIZED ASSESSMENTS: Addenbrooke's Cognitive Examination - ACE III The Addenbrooke's Cognitive Examination-III (ACE-III) is a brief cognitive test that assesses five cognitive domains. The total score is 100 with higher scores indicating better cognitive functioning. Cut off scores of 88 and 82 are recommended for suspicion of dementia (88 has sensitivity of 1.00 and specificity of 0.96, 82 has sensitivity of 0.93 and specificity of 1.00). American Version C  Attention 16/18  Memory 15/26  Fluency 11/14  Language 25/26  Visuospatial 13/16  TOTAL ACE- III Score 80/100     PATIENT REPORTED OUTCOME MEASURES (PROM): To be given in upcoming sessions   TODAY'S TREATMENT:   Pt and family educated at length re: role of SLP, domains of cognition, results of assessment, SLP POC, goals of tx, importance of communication partner training, and overall prognosis. Pertinent questions were answered with satisfaction. Supportive counseling provided, as needed, re: CLOF for cognitive-communication.    PATIENT EDUCATION: Education details: As above Person educated: Patient and wife and daughter Education method: Explanation Education comprehension: verbalized understanding and needs further education  HOME EXERCISE PROGRAM:        To be provided in upcoming sessions    GOALS:  Goals reviewed with patient? Yes  SHORT TERM GOALS: Target date: 10 sessions  Pt will complete PROM for functional communication and memory with additional goals added as indicated. Baseline: Goal status: INITIAL   2.   Pt will utilize compensatory strategies for anomia during structured tasks with min/mod cueing.   Baseline:  Goal status: INITIAL  3.  Pt/family will verbalize at least x3 external factors that may affect cognitive-linguistic functioning.  Baseline:  Goal status: INITIAL   LONG TERM GOALS: Target date: 09/03/23  Patient/family will demonstrate knowledge of appropriate  activities to support cognitive and language function outside of ST.  Baseline:  Goal status: INITIAL  2.  Pt will utilize compensatory strategies for anomia with min/mod A to repair communication breakdowns.  Baseline:  Goal status: INITIAL   ASSESSMENT:  CLINICAL IMPRESSION:  Patient is a 78 y.o. male who was seen today for cognitive-communication evaluation.  Per Dr. Jamal note, pt with cognitive impairments with visual hallucinations.  Per Dr. Jamal note, pt with cognitive impairments with visual hallucinations. Assessment completed via Addenbrooke's Cognitive Evaluation (ACE-III). PROM to be completed in upcoming sessions to further inform tx. Pt presents with cognitive-linguistic deficits affecting attention, memory (immediate, short term, working), and verbal expression. Pt's speech is mainly fluent with intermittent wordfinding difficulty. Confrontation naming is [facility]. Divergent naming is mildly impaired. Suspect reduced attention and working memory impacting other cognitive-linguistic domains. Recommend course of ST targeting above mentioned deficits for implementation of compensations and education re: cognitive-linguistic functioning.   OBJECTIVE IMPAIRMENTS include attention, memory, and expressive language. These impairments are limiting patient from household responsibilities, ADLs/IADLs, and effectively communicating at home and in community. Factors affecting potential to achieve goals and functional outcome are ability to learn/carryover information. Patient will benefit from skilled SLP services to address above impairments and improve overall function.  REHAB POTENTIAL: Good  PLAN: SLP FREQUENCY: 1-2x/week  SLP DURATION: 12 weeks  PLANNED INTERVENTIONS: Language facilitation, Environmental controls, Cueing hierachy, Cognitive reorganization, Internal/external aids, Functional tasks, Multimodal communication approach, SLP instruction and feedback, Compensatory strategies,  and Patient/family education    Delon Bangs, M.S., CCC-SLP Speech-Language Pathologist [facility] [phone] [place])  [facility]  Rehabilitation at [facility] [address] [place], [place], [address] Phone: [phone]   Fax:  [phone]

## 2023-06-15 ENCOUNTER — Ambulatory Visit: Payer: Medicare PPO

## 2023-06-15 ENCOUNTER — Encounter: Payer: Medicare PPO | Admitting: Speech Pathology

## 2023-06-15 DIAGNOSIS — R413 Other amnesia: Secondary | ICD-10-CM

## 2023-06-15 DIAGNOSIS — R41841 Cognitive communication deficit: Secondary | ICD-10-CM

## 2023-06-15 NOTE — Therapy (Signed)
 OUTPATIENT SPEECH LANGUAGE PATHOLOGY  TREATMENT   Patient Name: Brian Kramer MRN: [id number] DOB:[birth year], 78 y.o., male Today's Date: 06/15/2023  ERE:Xjywxj Alla, MD   REFERRING PROVIDER: Jannett Fairly, MD   End of Session - 06/15/23 1526     Visit Number 2    Number of Visits 24    Date for SLP Re-Evaluation 09/03/23    Authorization - Number of Visits 10    SLP Start Time 1515    SLP Stop Time  1600    SLP Time Calculation (min) 45 min    Activity Tolerance Patient tolerated treatment well              Patient Active Problem List   Diagnosis Date Noted   Hyperkalemia 04/07/2023   Oral candidosis 04/07/2023   Coughing 04/07/2023   S/P revision of total knee, 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   SOB (shortness of breath) 06/28/2022   AKI (acute kidney injury) (HCC) 06/26/2022   COPD with acute exacerbation (HCC) 06/25/2022   Acute on chronic respiratory failure with hypoxia (HCC) 06/25/2022   Pneumonia due to COVID-19 virus 06/25/2022   Dementia without behavioral disturbance (HCC) 06/25/2022   Frailty 06/25/2022   COPD, moderate (HCC) 06/23/2022   Mild pulmonary hypertension (HCC) 01/02/2022   PAD (peripheral artery disease) (HCC) 04/08/2021   Essential hypertension 04/08/2021   CAD (coronary artery disease) 04/08/2021   Hyperlipidemia 04/08/2021   Leg pain 04/08/2021   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 Pt accompanied by: self, significant other, and family member  PERTINENT HISTORY: Pt is a 78 y.o. male presenting for a cognitive-communication evaluation in setting of dementia. Per Dr. Jamal note, pt with cognitive impairments with visual  hallucinations. 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 Pt with subjective reports of worsening memory and wordfinding/anomia.e details on [employer].  DIAGNOSTIC FINDINGS:  MRI brain 2021, 1. No acute intracranial abnormality. 2. Largely stable MRI appearance of the brain since 2019, with signal changes suspicious for chronic small vessel disease. But there is a new focus of microhemorrhage or mild superficial siderosis at the inferior left parietal lobe. 3. Stable small left anterior frontal bone lesion, favor benign hemangioma. 4. NeuroQuant volumetric analysis of the brain, se  PAIN:  Are you having pain? No   FALLS: Has patient fallen in last 6 months?  Yes  LIVING ENVIRONMENT: Lives with: lives with their spouse Lives in: House/apartment  PLOF:  Level of assistance: Needed assistance with ADLs Employment: Retired   PATIENT GOALS   to work on wordfinding  OBJECTIVE:   PATIENT REPORTED OUTCOME MEASURES (PROM):  MULTIFACTORIAL MEMORY QUESTIONNAIRE (MMQ)  Administered patient self-reported outcome measure Multifactorial Memory Questionnaire (MMQ). The Multifactorial Memory Questionnaire [facility]) consists of three scales measuring separate aspects of metamemory; Satisfaction, Ability and Strategy.   Pt's responses are converted to T-Scores with severity levels based on pt's T-Score.   Severity Levels (T-score) Very Low - < 20 Low - 20 to 29 Below Average - 30-39 Average - 40 to 60 Above Average - 60 to 70 High - 71 to 80 Very High - > 80  Pt reports:  Very Low - < 20  Memory Satisfaction (T-score: 29) Very Low - < 20 Memory Ability (T-score: <8) Average - 40 to 60 use of Memory Strategies (T-score: 55)   The Communication Effectiveness Survey is a patient-reported outcome measure in which the patient rates their own effectiveness in different  communication situations. A higher score indicates greater effectiveness.   Pt's self-rating was 17/32.   Having a conversation with a family member or friends at home. 3 Participating in conversation with strangers in a quiet place. 2 Conversing with a familiar person over the telephone. 3 Conversing with a stranger over the telephone. 2 Being part of a conversation in a noisy environment (social gathering). 2 Speaking to a friend when you are emotionally upset or you are angry. 1- Not at all effective Having a conversation while traveling in a car. 2 Having a conversation with someone at a distance (across a room). 3   TODAY'S TREATMENT:   Reviewed results of PROMs. Pt endorsed being most bothered about wordfinding difficulty and we collectively decided to make this the focus of our tx sessions. Introduced primary school teacher / circumlocution as an anomia strategy. Pt required mod/max A to provide x4 details about pictured nouns. Will continue to target in upcoming sessions.    PATIENT EDUCATION: Education details: As above Person educated: Patient and wife and daughter Education method: Explanation Education comprehension: verbalized understanding and needs further education  HOME EXERCISE PROGRAM:        To be provided in upcoming sessions    GOALS:  Goals reviewed with patient? Yes  SHORT TERM GOALS: Target date: 10 sessions  Pt will complete PROM for functional communication and memory with additional goals added as indicated. Baseline: Goal status: MET   2.   Pt will utilize compensatory strategies for anomia during structured tasks with min/mod cueing.   Baseline:  Goal status: INITIAL  3.  Pt/family will verbalize at least x3 external factors that may affect cognitive-linguistic functioning.  Baseline:  Goal status: INITIAL   LONG TERM GOALS: Target date: 09/03/23  Patient/family will demonstrate knowledge of appropriate activities to support  cognitive and language function outside of ST.  Baseline:  Goal status: INITIAL  2.  Pt will utilize compensatory strategies for anomia with min/mod A to repair communication breakdowns.  Baseline:  Goal status: INITIAL   ASSESSMENT:  CLINICAL IMPRESSION:  Patient is a 78 y.o. male who was seen today for cognitive-communication evaluation.  Per Dr. Jamal note, pt with cognitive impairments with visual hallucinations.  Per Dr. Jamal note, pt with cognitive impairments with visual hallucinations. Assessment completed via Addenbrooke's Cognitive Evaluation (ACE-III). PROM to be completed in upcoming sessions to further inform tx. Pt presents with cognitive-linguistic deficits affecting attention, memory (immediate, short term, working), and verbal expression. Pt's speech is mainly fluent with intermittent wordfinding difficulty. Confrontation naming is [facility]. Divergent naming is mildly impaired. Suspect reduced attention and working memory impacting other cognitive-linguistic domains. Recommend course of ST targeting above mentioned deficits for implementation of compensations and education re: cognitive-linguistic functioning.   OBJECTIVE IMPAIRMENTS include attention, memory, and expressive language. These impairments are limiting patient from household responsibilities, ADLs/IADLs, and effectively communicating at home and in community. Factors affecting potential to achieve goals and functional outcome are ability to learn/carryover information. Patient will benefit from skilled SLP services to address above impairments and improve overall function.  REHAB POTENTIAL: Good  PLAN: SLP FREQUENCY: 1-2x/week  SLP DURATION: 12 weeks  PLANNED INTERVENTIONS: Language facilitation, Environmental controls, Cueing hierachy, Cognitive reorganization, Internal/external aids,  Functional tasks, Multimodal communication approach, SLP instruction and feedback, Compensatory strategies, and Patient/family  education    Delon Bangs, M.S., CCC-SLP Speech-Language Pathologist [facility] [phone] [place])  [facility] Outpatient Rehabilitation at [facility] [address] [place], [place], [address] Phone: [phone]   Fax:  [phone]

## 2023-06-17 ENCOUNTER — Ambulatory Visit: Payer: Medicare PPO

## 2023-06-17 ENCOUNTER — Encounter: Payer: Medicare PPO | Admitting: Speech Pathology

## 2023-06-17 DIAGNOSIS — R41841 Cognitive communication deficit: Secondary | ICD-10-CM | POA: Diagnosis not present

## 2023-06-17 NOTE — Therapy (Signed)
 OUTPATIENT SPEECH LANGUAGE PATHOLOGY  TREATMENT   Patient Name: Brian Kramer MRN: [id number] DOB:1945/06/14, 78 y.o., male Today's Date: 06/17/2023  ERE:Xjywxj Alla, MD   REFERRING PROVIDER: Jannett Fairly, MD   End of Session - 06/17/23 1129     Visit Number 3    Number of Visits 24    Date for SLP Re-Evaluation 09/03/23    SLP Start Time 1010    SLP Stop Time  1100    SLP Time Calculation (min) 50 min    Activity Tolerance Patient tolerated treatment well              Patient Active Problem List   Diagnosis Date Noted   Hyperkalemia 04/07/2023   Oral candidosis 04/07/2023   Coughing 04/07/2023   S/P revision of total knee, 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   SOB (shortness of breath) 06/28/2022   AKI (acute kidney injury) (HCC) 06/26/2022   COPD with acute exacerbation (HCC) 06/25/2022   Acute on chronic respiratory failure with hypoxia (HCC) 06/25/2022   Pneumonia due to COVID-19 virus 06/25/2022   Dementia without behavioral disturbance (HCC) 06/25/2022   Frailty 06/25/2022   COPD, moderate (HCC) 06/23/2022   Mild pulmonary hypertension (HCC) 01/02/2022   PAD (peripheral artery disease) (HCC) 04/08/2021   Essential hypertension 04/08/2021   CAD (coronary artery disease) 04/08/2021   Hyperlipidemia 04/08/2021   Leg pain 04/08/2021   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 Pt accompanied by: self, significant other, and family member  PERTINENT HISTORY: Pt is a 78 y.o. male presenting for a cognitive-communication evaluation in setting of dementia. Per Dr. Jamal note, pt with cognitive impairments with visual hallucinations. Pt with hippocamps 7th percentile per NeuroQuant  (03/2020). Pt with hx of tremor with component of mycolonus and positive family history of late-onset dementia. Pt with subjective reports of worsening memory and wordfinding/anomia.e details on [employer].  DIAGNOSTIC FINDINGS:  MRI brain 2021, 1. No acute intracranial abnormality. 2. Largely stable MRI appearance of the brain since 2019, with signal changes suspicious for chronic small vessel disease. But there is a new focus of microhemorrhage or mild superficial siderosis at the inferior left parietal lobe. 3. Stable small left anterior frontal bone lesion, favor benign hemangioma. 4. NeuroQuant volumetric analysis of the brain, se  PAIN:  Are you having pain? No   FALLS: Has patient fallen in last 6 months?  Yes  LIVING ENVIRONMENT: Lives with: lives with their spouse Lives in: House/apartment  PLOF:  Level of assistance: Needed assistance with ADLs Employment: Retired   PATIENT GOALS   to work on wordfinding  OBJECTIVE:   TODAY'S TREATMENT:   Reviewed primary school teacher / circumlocution as an anomia strategy. Pt required min/mod to provide x4 details about pictured nouns. Pt required min cues to provide the best description of pictured nouns during barrier task with wife and daughter. Will continue to target in upcoming sessions. Pt endorsed feeling like he does well in tx sessions, but has difficulty with wordfinding under pressure. Supportive counseling provided as well as further education re: rationale for compensatory strategy use and self-advocacy strategies.   PATIENT EDUCATION: Education details: As above Person educated: Patient and wife and daughter Education method: Explanation Education comprehension: verbalized understanding and needs further education  HOME EXERCISE PROGRAM:  To be provided in upcoming sessions    GOALS:  Goals reviewed with patient? Yes  SHORT TERM GOALS: Target date: 10 sessions  Pt will complete PROM for  functional communication and memory with additional goals added as indicated. Baseline: Goal status: MET   2.   Pt will utilize compensatory strategies for anomia during structured tasks with min/mod cueing.   Baseline:  Goal status: INITIAL  3.  Pt/family will verbalize at least x3 external factors that may affect cognitive-linguistic functioning.  Baseline:  Goal status: INITIAL   LONG TERM GOALS: Target date: 09/03/23  Patient/family will demonstrate knowledge of appropriate activities to support cognitive and language function outside of ST.  Baseline:  Goal status: INITIAL  2.  Pt will utilize compensatory strategies for anomia with min/mod A to repair communication breakdowns.  Baseline:  Goal status: INITIAL   ASSESSMENT:  CLINICAL IMPRESSION:  Patient is a 78 y.o. male who was seen today for cognitive-communication evaluation.  Per Dr. Jamal note, pt with cognitive impairments with visual hallucinations.  Per Dr. Jamal note, pt with cognitive impairments with visual hallucinations. Assessment completed via Addenbrooke's Cognitive Evaluation (ACE-III). PROM to be completed in upcoming sessions to further inform tx. Pt presents with cognitive-linguistic deficits affecting attention, memory (immediate, short term, working), and verbal expression. Pt's speech is mainly fluent with intermittent wordfinding difficulty. Confrontation naming is [facility]. Divergent naming is mildly impaired. Suspect reduced attention and working memory impacting other cognitive-linguistic domains. Recommend course of ST targeting above mentioned deficits for implementation of compensations and education re: cognitive-linguistic functioning.   OBJECTIVE IMPAIRMENTS include attention, memory, and expressive language. These impairments are limiting patient from household responsibilities, ADLs/IADLs, and effectively communicating at home and in community. Factors affecting potential to achieve goals and  functional outcome are ability to learn/carryover information. Patient will benefit from skilled SLP services to address above impairments and improve overall function.  REHAB POTENTIAL: Good  PLAN: SLP FREQUENCY: 1-2x/week  SLP DURATION: 12 weeks  PLANNED INTERVENTIONS: Language facilitation, Environmental controls, Cueing hierachy, Cognitive reorganization, Internal/external aids, Functional tasks, Multimodal communication approach, SLP instruction and feedback, Compensatory strategies, and Patient/family education    Delon Bangs, M.S., CCC-SLP Speech-Language Pathologist [facility] [phone] [place])  [facility] Outpatient Rehabilitation at [facility] [address] [place], [place], [address] Phone: [phone]   Fax:  [phone]

## 2023-06-22 ENCOUNTER — Ambulatory Visit: Payer: Medicare PPO

## 2023-06-22 DIAGNOSIS — R413 Other amnesia: Secondary | ICD-10-CM

## 2023-06-22 DIAGNOSIS — R41841 Cognitive communication deficit: Secondary | ICD-10-CM

## 2023-06-22 NOTE — Therapy (Signed)
 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   SOB (shortness of breath) 06/28/2022   AKI (acute kidney injury) (HCC) 06/26/2022   COPD with acute exacerbation (HCC) 06/25/2022   Acute on chronic respiratory failure with hypoxia (HCC) 06/25/2022   Pneumonia due to COVID-19 virus 06/25/2022   Dementia without behavioral disturbance (HCC) 06/25/2022   Frailty 06/25/2022   COPD, moderate (HCC) 06/23/2022   Mild pulmonary hypertension (HCC) 01/02/2022   PAD (peripheral artery disease) (HCC) 04/08/2021   Essential hypertension 04/08/2021   CAD (coronary artery disease) 04/08/2021   Hyperlipidemia 04/08/2021   Leg pain 04/08/2021   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 Pt accompanied by: self, significant other, and family member  PERTINENT HISTORY: Pt is a 78 y.o. male presenting for a cognitive-communication evaluation in setting of dementia. Per Dr. Jamal note, pt with cognitive impairments with visual hallucinations. 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 Pt with hx of tremor with  component of mycolonus and positive family history of late-onset dementia. Pt with subjective reports of worsening memory and wordfinding/anomia.e details on [employer].  DIAGNOSTIC FINDINGS:  MRI brain 2021, 1. No acute intracranial abnormality. 2. Largely stable MRI appearance of the brain since 2019, with signal changes suspicious for chronic small vessel disease. But there is a new focus of microhemorrhage or mild superficial siderosis at the inferior left parietal lobe. 3. Stable small left anterior frontal bone lesion, favor benign hemangioma. 4. NeuroQuant volumetric analysis of the brain, se  PAIN:  Are you having pain? No   FALLS: Has patient fallen in last 6 months?  Yes  LIVING ENVIRONMENT: Lives with: lives with their spouse Lives in: House/apartment  PLOF:  Level of assistance: Needed assistance with ADLs Employment: Retired   PATIENT GOALS   to work on wordfinding  OBJECTIVE:   TODAY'S TREATMENT:   Reviewed primary school teacher / circumlocution as an anomia strategy. Played modified version of Taboo to target use of semantic feature analysis during barrier task with pt, wife, and daughter. Rare cues for pt to provide additional detail. Improving use of SFA noted.   PATIENT EDUCATION: Education details: As above Person educated: Patient and wife and daughter Education method: Explanation Education comprehension: verbalized understanding and needs further education  HOME EXERCISE PROGRAM:        To be provided in upcoming sessions    GOALS:  Goals reviewed with patient? Yes  SHORT TERM GOALS: Target date: 10 sessions  Pt will complete PROM for functional communication and memory with additional  goals added as indicated. Baseline: Goal status: MET   2.   Pt will utilize compensatory strategies for anomia during structured tasks with min/mod cueing.   Baseline:  Goal status: INITIAL  3.  Pt/family will verbalize at least x3 external factors that  may affect cognitive-linguistic functioning.  Baseline:  Goal status: INITIAL   LONG TERM GOALS: Target date: 09/03/23  Patient/family will demonstrate knowledge of appropriate activities to support cognitive and language function outside of ST.  Baseline:  Goal status: INITIAL  2.  Pt will utilize compensatory strategies for anomia with min/mod A to repair communication breakdowns.  Baseline:  Goal status: INITIAL   ASSESSMENT:  CLINICAL IMPRESSION:  Patient is a 78 y.o. male who was seen today for cognitive-communication evaluation.  Per Dr. Jamal note, pt with cognitive impairments with visual hallucinations.  Per Dr. Jamal note, pt with cognitive impairments with visual hallucinations. Assessment completed via Addenbrooke's Cognitive Evaluation (ACE-III). PROM to be completed in upcoming sessions to further inform tx. Pt presents with cognitive-linguistic deficits affecting attention, memory (immediate, short term, working), and verbal expression. Pt's speech is mainly fluent with intermittent wordfinding difficulty. Confrontation naming is [facility]. Divergent naming is mildly impaired. Suspect reduced attention and working memory impacting other cognitive-linguistic domains. Recommend course of ST targeting above mentioned deficits for implementation of compensations and education re: cognitive-linguistic functioning.   OBJECTIVE IMPAIRMENTS include attention, memory, and expressive language. These impairments are limiting patient from household responsibilities, ADLs/IADLs, and effectively communicating at home and in community. Factors affecting potential to achieve goals and functional outcome are ability to learn/carryover information. Patient will benefit from skilled SLP services to address above impairments and improve overall function.  REHAB POTENTIAL: Good  PLAN: SLP FREQUENCY: 1-2x/week  SLP DURATION: 12 weeks  PLANNED INTERVENTIONS: Language facilitation, Environmental  controls, Cueing hierachy, Cognitive reorganization, Internal/external aids, Functional tasks, Multimodal communication approach, SLP instruction and feedback, Compensatory strategies, and Patient/family education    Delon Bangs, M.S., CCC-SLP Speech-Language Pathologist [facility] [phone] [place])  [facility] Outpatient Rehabilitation at [facility] [address] [place], [place], [address] Phone: [phone]   Fax:  [phone]

## 2023-06-24 ENCOUNTER — Ambulatory Visit: Payer: Medicare PPO

## 2023-06-24 DIAGNOSIS — R41841 Cognitive communication deficit: Secondary | ICD-10-CM | POA: Diagnosis not present

## 2023-06-24 NOTE — Therapy (Signed)
 OUTPATIENT SPEECH LANGUAGE PATHOLOGY  TREATMENT   Patient Name: Brian Kramer MRN: [id number] DOB:1945/08/13, 78 y.o., male Today's Date: 06/24/2023  WGN:[id number] Brian Moorman, MD   REFERRING PROVIDER: Devora Folks, MD   End of Session - 06/24/23 1217     Visit Number 5    Number of Visits 24    Date for SLP Re-Evaluation 09/03/23    SLP Start Time 1130    SLP Stop Time  1215    SLP Time Calculation (min) 45 min    Activity Tolerance Patient tolerated treatment well              Patient Active Problem List   Diagnosis Date Noted   Hyperkalemia 04/07/2023   Oral candidosis 04/07/2023   Coughing 04/07/2023   S/P revision of total knee, 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   SOB (shortness of breath) 06/28/2022   AKI (acute kidney injury) (HCC) 06/26/2022   COPD with acute exacerbation (HCC) 06/25/2022   Acute on chronic respiratory failure with hypoxia (HCC) 06/25/2022   Pneumonia due to COVID-19 virus 06/25/2022   Dementia without behavioral disturbance (HCC) 06/25/2022   Frailty 06/25/2022   COPD, moderate (HCC) 06/23/2022   Mild pulmonary hypertension (HCC) 01/02/2022   PAD (peripheral artery disease) (HCC) 04/08/2021   Essential hypertension 04/08/2021   CAD (coronary artery disease) 04/08/2021   Hyperlipidemia 04/08/2021   Leg pain 04/08/2021   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 Pt accompanied by: self, significant other, and family member  PERTINENT HISTORY: Pt is a 78 y.o. male presenting for a cognitive-communication evaluation in setting of dementia. Per Dr. Carlis Cherry note, pt with cognitive impairments with visual hallucinations. Pt with hippocamps 7th percentile per NeuroQuant  (03/2020). Pt with hx of tremor with component of mycolonus and positive family history of late-onset dementia. Pt with subjective reports of worsening memory and wordfinding/anomia.e details on [facility] PACS."  DIAGNOSTIC FINDINGS:  MRI brain 2021, "1. No acute intracranial abnormality. 2. Largely stable MRI appearance of the brain since 2019, with signal changes suspicious for chronic small vessel disease. But there is a new focus of microhemorrhage or mild superficial siderosis at the inferior left parietal lobe. 3. Stable small left anterior frontal bone lesion, favor benign hemangioma. 4. NeuroQuant volumetric analysis of the brain, se  PAIN:  Are you having pain? No   FALLS: Has patient fallen in last 6 months?  Yes  LIVING ENVIRONMENT: Lives with: lives with their spouse Lives in: House/apartment  PLOF:  Level of assistance: Needed assistance with ADLs Employment: Retired   PATIENT GOALS   to work on wordfinding  OBJECTIVE:   TODAY'S TREATMENT:   Reviewed Primary school teacher / circumlocution as an anomia strategy. Played modified version of Scattergories to target use of semantic feature analysis and categorization during barrier task with pt and wife. Rare-min cues for pt to provide additional detail. Improving use of SFA noted.   PATIENT EDUCATION: Education details: As above Person educated: pt and wife Education method: Explanation Education comprehension: verbalized understanding and needs further education  HOME EXERCISE PROGRAM:        Continue use of SFA in conversations    GOALS:  Goals reviewed with patient? Yes  SHORT TERM GOALS: Target date: 10 sessions  Pt will complete PROM for functional  communication and memory with additional goals added as indicated. Baseline: Goal status: MET   2.   Pt will utilize compensatory strategies for anomia during structured tasks with min/mod cueing.   Baseline:  Goal status: INITIAL  3.  Pt/family will  verbalize at least x3 external factors that may affect cognitive-linguistic functioning.  Baseline:  Goal status: INITIAL   LONG TERM GOALS: Target date: 09/03/23  Patient/family will demonstrate knowledge of appropriate activities to support cognitive and language function outside of ST.  Baseline:  Goal status: INITIAL  2.  Pt will utilize compensatory strategies for anomia with min/mod A to repair communication breakdowns.  Baseline:  Goal status: INITIAL   ASSESSMENT:  CLINICAL IMPRESSION:  Patient is a 78 y.o. male who was seen today for cognitive-communication treatment.  Per Dr. Carlis Cherry note, pt with cognitive impairments with visual hallucinations.  Per Dr. Carlis Cherry note, pt with cognitive impairments with visual hallucinations. Assessment completed via Addenbrooke's Cognitive Evaluation (ACE-III). PROM to be completed in upcoming sessions to further inform tx. Pt presents with cognitive-linguistic deficits affecting attention, memory (immediate, short term, working), and verbal expression. Pt's speech is mainly fluent with intermittent wordfinding difficulty. Confrontation naming is [facility]. Divergent naming is mildly impaired. Suspect reduced attention and working memory impacting other cognitive-linguistic domains. See above for details of today's tx session. Recommend course of ST targeting above mentioned deficits for implementation of compensations and education re: cognitive-linguistic functioning.   OBJECTIVE IMPAIRMENTS include attention, memory, and expressive language. These impairments are limiting patient from household responsibilities, ADLs/IADLs, and effectively communicating at home and in community. Factors affecting potential to achieve goals and functional outcome are ability to learn/carryover information. Patient will benefit from skilled SLP services to address above impairments and improve overall function.  REHAB POTENTIAL: Good  PLAN: SLP FREQUENCY:  1-2x/week  SLP DURATION: 12 weeks  PLANNED INTERVENTIONS: Language facilitation, Environmental controls, Cueing hierachy, Cognitive reorganization, Internal/external aids, Functional tasks, Multimodal communication approach, SLP instruction and feedback, Compensatory strategies, and Patient/family education    Dia Forget, M.S., CCC-SLP Speech-Language Pathologist [place] [facility] [phone] Rogers Clayman)  [facility] Outpatient Rehabilitation at [facility] [address] [place], [place], [address] Phone: [phone]   Fax:  [phone]

## 2023-06-29 ENCOUNTER — Ambulatory Visit: Payer: Medicare PPO

## 2023-06-29 DIAGNOSIS — R41841 Cognitive communication deficit: Secondary | ICD-10-CM | POA: Diagnosis not present

## 2023-06-29 NOTE — Therapy (Signed)
OUTPATIENT SPEECH LANGUAGE PATHOLOGY  TREATMENT   Patient Name: Brian Kramer MRN: [id number] DOB:07-30-1945, 78 y.o., male Today's Date: 06/29/2023  ION:[id number] [name], MD   REFERRING PROVIDER: Cristopher Peru, MD   End of Session - 06/29/23 1014     Visit Number 6    Number of Visits 24    Date for SLP Re-Evaluation 09/03/23    SLP Start Time 0845    SLP Stop Time  0930    SLP Time Calculation (min) 45 min    Activity Tolerance Patient tolerated treatment well              Patient Active Problem List   Diagnosis Date Noted   Hyperkalemia 04/07/2023   Oral candidosis 04/07/2023   Coughing 04/07/2023   S/P revision of total knee, 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   SOB (shortness of breath) 06/28/2022   AKI (acute kidney injury) (HCC) 06/26/2022   COPD with acute exacerbation (HCC) 06/25/2022   Acute on chronic respiratory failure with hypoxia (HCC) 06/25/2022   Pneumonia due to COVID-19 virus 06/25/2022   Dementia without behavioral disturbance (HCC) 06/25/2022   Frailty 06/25/2022   COPD, moderate (HCC) 06/23/2022   Mild pulmonary hypertension (HCC) 01/02/2022   PAD (peripheral artery disease) (HCC) 04/08/2021   Essential hypertension 04/08/2021   CAD (coronary artery disease) 04/08/2021   Hyperlipidemia 04/08/2021   Leg pain 04/08/2021   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 Pt accompanied by: self,  family member  PERTINENT HISTORY: Pt is a 78 y.o. male presenting for a cognitive-communication evaluation in setting of dementia. Per Dr. Margaretmary Eddy note, pt with cognitive impairments with visual hallucinations. 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 Pt with hx of  tremor with component of mycolonus and positive family history of late-onset dementia. Pt with subjective reports of worsening memory and wordfinding/anomia.e details on [facility] PACS."  DIAGNOSTIC FINDINGS:  MRI brain 2021, "1. No acute intracranial abnormality. 2. Largely stable MRI appearance of the brain since 2019, with signal changes suspicious for chronic small vessel disease. But there is a new focus of microhemorrhage or mild superficial siderosis at the inferior left parietal lobe. 3. Stable small left anterior frontal bone lesion, favor benign hemangioma. 4. NeuroQuant volumetric analysis of the brain, se  PAIN:  Are you having pain? No   FALLS: Has patient fallen in last 6 months?  Yes  LIVING ENVIRONMENT: Lives with: lives with their spouse Lives in: House/apartment  PLOF:  Level of assistance: Needed assistance with ADLs Employment: Retired   PATIENT GOALS   to work on wordfinding  OBJECTIVE:   TODAY'S TREATMENT:   Reviewed Primary school teacher / circumlocution as an anomia strategy. Played modified version of "5 second rule" targeting divergent naming under a time constraint (15 seconds for tx today). Pt with increased difficulty with wordfinding during time-constraint vs during untimed categorization task. Min cues needed during time task for pt to employ SFA strategy. Pt endorsed increased difficulty with wordfinding under pressure. Supportive counseling provided as appropriate.   PATIENT EDUCATION: Education details: As above Person educated: pt and daughter Education method: Explanation Education comprehension: verbalized understanding and needs further education  HOME EXERCISE PROGRAM:        Continue use of SFA in conversations; 5 second rule cards  for home practice    GOALS:  Goals reviewed with patient? Yes  SHORT TERM GOALS: Target date: 10 sessions  Pt will complete PROM for functional communication and memory with additional goals added as  indicated. Baseline: Goal status: MET   2.   Pt will utilize compensatory strategies for anomia during structured tasks with min/mod cueing.   Baseline:  Goal status: INITIAL  3.  Pt/family will verbalize at least x3 external factors that may affect cognitive-linguistic functioning.  Baseline:  Goal status: INITIAL   LONG TERM GOALS: Target date: 09/03/23  Patient/family will demonstrate knowledge of appropriate activities to support cognitive and language function outside of ST.  Baseline:  Goal status: INITIAL  2.  Pt will utilize compensatory strategies for anomia with min/mod A to repair communication breakdowns.  Baseline:  Goal status: INITIAL   ASSESSMENT:  CLINICAL IMPRESSION:  Patient is a 78 y.o. male who was seen today for cognitive-communication treatment.  Per Dr. Margaretmary Eddy note, pt with cognitive impairments with visual hallucinations.  Per Dr. Margaretmary Eddy note, pt with cognitive impairments with visual hallucinations. Assessment completed via Addenbrooke's Cognitive Evaluation (ACE-III). PROM to be completed in upcoming sessions to further inform tx. Pt presents with cognitive-linguistic deficits affecting attention, memory (immediate, short term, working), and verbal expression. Pt's speech is mainly fluent with intermittent wordfinding difficulty. Confrontation naming is [facility]. Divergent naming is mildly impaired. Suspect reduced attention and working memory impacting other cognitive-linguistic domains. See above for details of today's tx session. Recommend course of ST targeting above mentioned deficits for implementation of compensations and education re: cognitive-linguistic functioning.   OBJECTIVE IMPAIRMENTS include attention, memory, and expressive language. These impairments are limiting patient from household responsibilities, ADLs/IADLs, and effectively communicating at home and in community. Factors affecting potential to achieve goals and functional outcome are  ability to learn/carryover information. Patient will benefit from skilled SLP services to address above impairments and improve overall function.  REHAB POTENTIAL: Good  PLAN: SLP FREQUENCY: 1-2x/week  SLP DURATION: 12 weeks  PLANNED INTERVENTIONS: Language facilitation, Environmental controls, Cueing hierachy, Cognitive reorganization, Internal/external aids, Functional tasks, Multimodal communication approach, SLP instruction and feedback, Compensatory strategies, and Patient/family education    Clyde Canterbury, M.S., CCC-SLP Speech-Language Pathologist [place] [facility] [phone] Arnette Felts)  [facility] Outpatient Rehabilitation at [facility] [address] [place], [place], [address] Phone: [phone]   Fax:  [phone]

## 2023-07-01 ENCOUNTER — Ambulatory Visit: Payer: Medicare PPO

## 2023-07-06 ENCOUNTER — Ambulatory Visit: Payer: Medicare PPO

## 2023-07-06 DIAGNOSIS — R41841 Cognitive communication deficit: Secondary | ICD-10-CM | POA: Diagnosis not present

## 2023-07-06 NOTE — Therapy (Signed)
OUTPATIENT SPEECH LANGUAGE PATHOLOGY  TREATMENT   Patient Name: Brian Kramer MRN: [id number] DOB:October 06, 1945, 78 y.o., male Today's Date: 07/06/2023  WUJ:[id number] [name], MD   REFERRING PROVIDER: Cristopher Peru, MD   End of Session - 07/06/23 1603     Visit Number 7    Number of Visits 24    Date for SLP Re-Evaluation 09/03/23    SLP Start Time 1515    SLP Stop Time  1600    SLP Time Calculation (min) 45 min    Activity Tolerance Patient tolerated treatment well              Patient Active Problem List   Diagnosis Date Noted   Hyperkalemia 04/07/2023   Oral candidosis 04/07/2023   Coughing 04/07/2023   S/P revision of total knee, 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   SOB (shortness of breath) 06/28/2022   AKI (acute kidney injury) (HCC) 06/26/2022   COPD with acute exacerbation (HCC) 06/25/2022   Acute on chronic respiratory failure with hypoxia (HCC) 06/25/2022   Pneumonia due to COVID-19 virus 06/25/2022   Dementia without behavioral disturbance (HCC) 06/25/2022   Frailty 06/25/2022   COPD, moderate (HCC) 06/23/2022   Mild pulmonary hypertension (HCC) 01/02/2022   PAD (peripheral artery disease) (HCC) 04/08/2021   Essential hypertension 04/08/2021   CAD (coronary artery disease) 04/08/2021   Hyperlipidemia 04/08/2021   Leg pain 04/08/2021   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 Pt accompanied by: self,  family member  PERTINENT HISTORY: Pt is a 78 y.o. male presenting for a cognitive-communication evaluation in setting of dementia. Per Dr. Margaretmary Eddy note, pt with cognitive impairments with visual hallucinations. 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 Pt with hx of  tremor with component of mycolonus and positive family history of late-onset dementia. Pt with subjective reports of worsening memory and wordfinding/anomia.e details on [facility] PACS."  DIAGNOSTIC FINDINGS:  MRI brain 2021, "1. No acute intracranial abnormality. 2. Largely stable MRI appearance of the brain since 2019, with signal changes suspicious for chronic small vessel disease. But there is a new focus of microhemorrhage or mild superficial siderosis at the inferior left parietal lobe. 3. Stable small left anterior frontal bone lesion, favor benign hemangioma. 4. NeuroQuant volumetric analysis of the brain, se  PAIN:  Are you having pain? No   FALLS: Has patient fallen in last 6 months?  Yes  LIVING ENVIRONMENT: Lives with: lives with their spouse Lives in: House/apartment  PLOF:  Level of assistance: Needed assistance with ADLs Employment: Retired   PATIENT GOALS   to work on wordfinding  OBJECTIVE:   TODAY'S TREATMENT:   Reviewed Primary school teacher / circumlocution as an anomia strategy. Played modified version of "Would you rather..?" targeting verbal expressionunder a time constraint (10 seconds for tx today). No overt wordfinding difficulty noted during structured task. X1 instance of anomia during informal conversational, pt able to repair with extra time.   Pt endorsed increased difficulty with wordfinding under pressure and today at home. Pt endorsed fatigue. Reviewed factors that may affect cognitive-communication (e.g. fatigue, medication, illness, etc). Supportive counseling provided as appropriate.   PATIENT EDUCATION: Education details: As above Person educated: pt and wife Education method: Explanation Education comprehension: verbalized understanding and needs further education  HOME EXERCISE PROGRAM:  Continue use of SFA in conversations    GOALS:  Goals reviewed with patient? Yes  SHORT TERM GOALS: Target date: 10 sessions  Pt will  complete PROM for functional communication and memory with additional goals added as indicated. Baseline: Goal status: MET   2.   Pt will utilize compensatory strategies for anomia during structured tasks with min/mod cueing.   Baseline:  Goal status: INITIAL  3.  Pt/family will verbalize at least x3 external factors that may affect cognitive-linguistic functioning.  Baseline:  Goal status: INITIAL   LONG TERM GOALS: Target date: 09/03/23  Patient/family will demonstrate knowledge of appropriate activities to support cognitive and language function outside of ST.  Baseline:  Goal status: INITIAL  2.  Pt will utilize compensatory strategies for anomia with min/mod A to repair communication breakdowns.  Baseline:  Goal status: INITIAL   ASSESSMENT:  CLINICAL IMPRESSION:  Patient is a 78 y.o. male who was seen today for cognitive-communication treatment.  Per Dr. Margaretmary Eddy note, pt with cognitive impairments with visual hallucinations.  Per Dr. Margaretmary Eddy note, pt with cognitive impairments with visual hallucinations. Assessment completed via Addenbrooke's Cognitive Evaluation (ACE-III). PROM to be completed in upcoming sessions to further inform tx. Pt presents with cognitive-linguistic deficits affecting attention, memory (immediate, short term, working), and verbal expression. Pt's speech is mainly fluent with intermittent wordfinding difficulty. Confrontation naming is [facility]. Divergent naming is mildly impaired. Suspect reduced attention and working memory impacting other cognitive-linguistic domains. See above for details of today's tx session. Recommend course of ST targeting above mentioned deficits for implementation of compensations and education re: cognitive-linguistic functioning.   OBJECTIVE IMPAIRMENTS include attention, memory, and expressive language. These impairments are limiting patient from household responsibilities, ADLs/IADLs, and effectively communicating at home and in  community. Factors affecting potential to achieve goals and functional outcome are ability to learn/carryover information. Patient will benefit from skilled SLP services to address above impairments and improve overall function.  REHAB POTENTIAL: Good  PLAN: SLP FREQUENCY: 1-2x/week  SLP DURATION: 12 weeks  PLANNED INTERVENTIONS: Language facilitation, Environmental controls, Cueing hierachy, Cognitive reorganization, Internal/external aids, Functional tasks, Multimodal communication approach, SLP instruction and feedback, Compensatory strategies, and Patient/family education    Clyde Canterbury, M.S., CCC-SLP Speech-Language Pathologist [place] [facility] [phone] Arnette Felts)  [facility] Outpatient Rehabilitation at [facility] [address] [place], [place], [address] Phone: [phone]   Fax:  [phone]

## 2023-07-08 ENCOUNTER — Ambulatory Visit: Payer: Medicare PPO

## 2023-07-08 DIAGNOSIS — R41841 Cognitive communication deficit: Secondary | ICD-10-CM

## 2023-07-08 NOTE — Therapy (Signed)
OUTPATIENT SPEECH LANGUAGE PATHOLOGY  TREATMENT   Patient Name: Brian Kramer MRN: [id number] DOB:[birth year], 78 y.o., male Today's Date: 07/08/2023  WGN:FAOZHY [name], MD   REFERRING PROVIDER: Cristopher Peru, MD   End of Session - 07/08/23 1542     Visit Number 8    Number of Visits 24    Date for SLP Re-Evaluation 09/03/23    SLP Start Time 1445    SLP Stop Time  1535    SLP Time Calculation (min) 50 min    Activity Tolerance Patient tolerated treatment well              Patient Active Problem List   Diagnosis Date Noted   Hyperkalemia 04/07/2023   Oral candidosis 04/07/2023   Coughing 04/07/2023   S/P revision of total knee, 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   SOB (shortness of breath) 06/28/2022   AKI (acute kidney injury) (HCC) 06/26/2022   COPD with acute exacerbation (HCC) 06/25/2022   Acute on chronic respiratory failure with hypoxia (HCC) 06/25/2022   Pneumonia due to COVID-19 virus 06/25/2022   Dementia without behavioral disturbance (HCC) 06/25/2022   Frailty 06/25/2022   COPD, moderate (HCC) 06/23/2022   Mild pulmonary hypertension (HCC) 01/02/2022   PAD (peripheral artery disease) (HCC) 04/08/2021   Essential hypertension 04/08/2021   CAD (coronary artery disease) 04/08/2021   Hyperlipidemia 04/08/2021   Leg pain 04/08/2021   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 Pt accompanied by: self,  family member  PERTINENT HISTORY: Pt is a 78 y.o. male presenting for a cognitive-communication evaluation in setting of dementia. Per Dr. Margaretmary Eddy note, pt with cognitive impairments with visual hallucinations. 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 Pt with hx of  tremor with component of mycolonus and positive family history of late-onset dementia. Pt with subjective reports of worsening memory and wordfinding/anomia.e details on [facility] PACS."  DIAGNOSTIC FINDINGS:  MRI brain 2021, "1. No acute intracranial abnormality. 2. Largely stable MRI appearance of the brain since 2019, with signal changes suspicious for chronic small vessel disease. But there is a new focus of microhemorrhage or mild superficial siderosis at the inferior left parietal lobe. 3. Stable small left anterior frontal bone lesion, favor benign hemangioma. 4. NeuroQuant volumetric analysis of the brain, se  PAIN:  Are you having pain? No   FALLS: Has patient fallen in last 6 months?  Yes  LIVING ENVIRONMENT: Lives with: lives with their spouse Lives in: House/apartment  PLOF:  Level of assistance: Needed assistance with ADLs Employment: Retired   PATIENT GOALS   to work on wordfinding  OBJECTIVE:   TODAY'S TREATMENT: Reviewed Primary school teacher / circumlocution as an anomia strategy. During structured and unstructured conversation based tasks, pt able to utilize SFA/circumlocution with rare cues. Pt played "Hot Potato" targeting cognitive-communication under a time constraint/pressure, again with rare cues for use of strategies.   Pt stated treatment has "tremendously improved" his wordfinding. Reviewed progress to date, importance of socialization, routine, and cognitive-linguistic "challenging" to help prevent further cognitive-linguistic decline. Education provided re: appropriate cognitive-linguistic activities to complete outside of therapy. Pt stated he is active in the [employer] and attends a men's dining group weekly. He also identified knitting as something knew he would like to learn to challenge his brain. He is  also active in computers (building, repairing) at home.   Pt made aware that he is likely nearing the end of SLP course of treatment given progress to  date.   PATIENT EDUCATION: Education details: As above Person educated: pt and wife Education method: Explanation Education comprehension: verbalized understanding and needs further education  HOME EXERCISE PROGRAM:        Continue use of SFA in conversations    GOALS:  Goals reviewed with patient? Yes  SHORT TERM GOALS: Target date: 10 sessions  Pt will complete PROM for functional communication and memory with additional goals added as indicated. Baseline: Goal status: MET   2.   Pt will utilize compensatory strategies for anomia during structured tasks with min/mod cueing.   Baseline:  Goal status: INITIAL  3.  Pt/family will verbalize at least x3 external factors that may affect cognitive-linguistic functioning.  Baseline:  Goal status: INITIAL   LONG TERM GOALS: Target date: 09/03/23  Patient/family will demonstrate knowledge of appropriate activities to support cognitive and language function outside of ST.  Baseline:  Goal status: INITIAL  2.  Pt will utilize compensatory strategies for anomia with min/mod A to repair communication breakdowns.  Baseline:  Goal status: INITIAL   ASSESSMENT:  CLINICAL IMPRESSION:  Patient is a 78 y.o. male who was seen today for cognitive-communication treatment.  Per Dr. Margaretmary Eddy note, pt with cognitive impairments with visual hallucinations.  Per Dr. Margaretmary Eddy note, pt with cognitive impairments with visual hallucinations. Assessment completed via [place]'s Cognitive Evaluation (ACE-III). PROM to be completed in upcoming sessions to further inform tx. Pt presents with cognitive-linguistic deficits affecting attention, memory (immediate, short term, working), and verbal expression. Pt's speech is mainly fluent with intermittent wordfinding difficulty. Confrontation naming is [facility]. Divergent naming is mildly impaired. Suspect reduced attention and working memory impacting other cognitive-linguistic domains. See above for details of  today's tx session. Recommend course of ST targeting above mentioned deficits for implementation of compensations and education re: cognitive-linguistic functioning.   OBJECTIVE IMPAIRMENTS include attention, memory, and expressive language. These impairments are limiting patient from household responsibilities, ADLs/IADLs, and effectively communicating at home and in community. Factors affecting potential to achieve goals and functional outcome are ability to learn/carryover information. Patient will benefit from skilled SLP services to address above impairments and improve overall function.  REHAB POTENTIAL: Good  PLAN: SLP FREQUENCY: 1-2x/week  SLP DURATION: 12 weeks  PLANNED INTERVENTIONS: Language facilitation, Environmental controls, Cueing hierachy, Cognitive reorganization, Internal/external aids, Functional tasks, Multimodal communication approach, SLP instruction and feedback, Compensatory strategies, and Patient/family education    Clyde Canterbury, M.S., CCC-SLP Speech-Language Pathologist [facility] [phone] Arnette Felts)  [facility] Outpatient Rehabilitation at [facility] [address] [place], [place], [address] Phone: [phone]   Fax:  [phone]

## 2023-07-13 ENCOUNTER — Ambulatory Visit: Payer: Medicare PPO | Attending: Neurology

## 2023-07-13 DIAGNOSIS — R41841 Cognitive communication deficit: Secondary | ICD-10-CM | POA: Diagnosis present

## 2023-07-13 NOTE — Therapy (Signed)
OUTPATIENT SPEECH LANGUAGE PATHOLOGY  TREATMENT   Patient Name: Brian Kramer MRN: [id number] DOB:1946/05/24, 78 y.o., male Today's Date: 07/13/2023  JYN:WGNFAO [name], MD   REFERRING PROVIDER: Cristopher Peru, MD   End of Session - 07/13/23 1529     Visit Number 9    Number of Visits 24    Date for SLP Re-Evaluation 09/03/23    SLP Start Time 1445    SLP Stop Time  1525    SLP Time Calculation (min) 40 min    Activity Tolerance Patient tolerated treatment well              Patient Active Problem List   Diagnosis Date Noted   Hyperkalemia 04/07/2023   Oral candidosis 04/07/2023   Coughing 04/07/2023   S/P revision of total knee, 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   SOB (shortness of breath) 06/28/2022   AKI (acute kidney injury) (HCC) 06/26/2022   COPD with acute exacerbation (HCC) 06/25/2022   Acute on chronic respiratory failure with hypoxia (HCC) 06/25/2022   Pneumonia due to COVID-19 virus 06/25/2022   Dementia without behavioral disturbance (HCC) 06/25/2022   Frailty 06/25/2022   COPD, moderate (HCC) 06/23/2022   Mild pulmonary hypertension (HCC) 01/02/2022   PAD (peripheral artery disease) (HCC) 04/08/2021   Essential hypertension 04/08/2021   CAD (coronary artery disease) 04/08/2021   Hyperlipidemia 04/08/2021   Leg pain 04/08/2021   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 Pt accompanied by: self,  family member  PERTINENT HISTORY: Pt is a 78 y.o. male presenting for a cognitive-communication evaluation in setting of dementia. Per Dr. Margaretmary Eddy note, pt with cognitive impairments with visual hallucinations. 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 Pt with hx of  tremor with component of mycolonus and positive family history of late-onset dementia. Pt with subjective reports of worsening memory and wordfinding/anomia.e details on [facility] PACS."  DIAGNOSTIC FINDINGS:  MRI brain 2021, "1. No acute intracranial abnormality. 2. Largely stable MRI appearance of the brain since 2019, with signal changes suspicious for chronic small vessel disease. But there is a new focus of microhemorrhage or mild superficial siderosis at the inferior left parietal lobe. 3. Stable small left anterior frontal bone lesion, favor benign hemangioma. 4. NeuroQuant volumetric analysis of the brain, se  PAIN:  Are you having pain? No   FALLS: Has patient fallen in last 6 months?  Yes  LIVING ENVIRONMENT: Lives with: lives with their spouse Lives in: House/apartment  PLOF:  Level of assistance: Needed assistance with ADLs Employment: Retired   PATIENT GOALS   to work on wordfinding  OBJECTIVE:   TODAY'S TREATMENT: Reviewed Primary school teacher / circumlocution as an anomia strategy. During unstructured conversation based tasks, pt able to utilize SFA/circumlocution with rare cues. Pt and wife note improvement in pt's ability to utilize SFA at home and during social engagements. Pt increased difficulty with wordfinding when emotionally charged, but stated that it's becoming "livable."   Pt stated treatment has "tremendously improved" his wordfinding. Reviewed progress to date, importance of socialization, routine, and cognitive-linguistic "challenging" to help prevent further cognitive-linguistic decline. Education provided re: appropriate cognitive-linguistic activities to complete outside of therapy.   Pt repeated the following PROM:   The Communication Effectiveness Survey is a patient-reported outcome measure in which the patient  rates their own effectiveness in different communication situations. A higher score indicates greater effectiveness.   Pt's self-rating  was 24/32. Score on 06/15/23 was 17/32.  Having a conversation with a family member or friends at home. 4- Very Effective Participating in conversation with strangers in a quiet place. 3 Conversing with a familiar person over the telephone. 3 Conversing with a stranger over the telephone. 3 Being part of a conversation in a noisy environment (social gathering). 3 Speaking to a friend when you are emotionally upset or you are angry. 2 Having a conversation while traveling in a car. 4- Very Effective Having a conversation with someone at a distance (across a room). 2  PATIENT EDUCATION: Education details: As above Person educated: pt and wife Education method: Explanation Education comprehension: verbalized understanding  HOME EXERCISE PROGRAM:        Continue use of SFA in conversations   Continue social engagement and physical activity    GOALS:  Goals reviewed with patient? Yes  SHORT TERM GOALS: Target date: 10 sessions  Pt will complete PROM for functional communication and memory with additional goals added as indicated. Baseline: Goal status: MET   2.   Pt will utilize compensatory strategies for anomia during structured tasks with min/mod cueing.   Baseline:  Goal status: MET  3.  Pt/family will verbalize at least x3 external factors that may affect cognitive-linguistic functioning.  Baseline:  Goal status: MET   LONG TERM GOALS: Target date: 09/03/23  Patient/family will demonstrate knowledge of appropriate activities to support cognitive and language function outside of ST.  Baseline:  Goal status: MET  2.  Pt will utilize compensatory strategies for anomia with min/mod A to repair communication breakdowns.  Baseline:  Goal status: MET   ASSESSMENT:  CLINICAL IMPRESSION:  Patient is a 78 y.o. male who was seen today for cognitive-communication treatment.  Per Dr. Margaretmary Eddy note, pt with cognitive impairments with visual hallucinations.  Per Dr. Margaretmary Eddy note,  pt with cognitive impairments with visual hallucinations. Assessment completed via Addenbrooke's Cognitive Evaluation (ACE-III). See above for details of today's tx session. Pt has met all ST goals. Marked improvement on PROM given this date. ST to d/c SLP services given progress. No further ST service recommended at this time.    PLAN: DISCHARGE ST    Clyde Canterbury, M.S., CCC-SLP Speech-Language Pathologist [facility] [phone] Arnette Felts)  [facility] Outpatient Rehabilitation at [facility] [address] [place], [place], [address] Phone: [phone]   Fax:  [phone]

## 2023-07-15 ENCOUNTER — Ambulatory Visit: Payer: Medicare PPO

## 2023-07-20 ENCOUNTER — Ambulatory Visit: Payer: Medicare PPO

## 2023-07-22 ENCOUNTER — Ambulatory Visit: Payer: Medicare PPO

## 2023-07-27 ENCOUNTER — Ambulatory Visit: Payer: Medicare PPO

## 2023-07-29 ENCOUNTER — Ambulatory Visit: Payer: Medicare PPO

## 2023-07-30 ENCOUNTER — Inpatient Hospital Stay
Admission: EM | Admit: 2023-07-30 | Discharge: 2023-08-01 | DRG: 871 | Disposition: A | Discharge status: Home Health | Payer: Medicare PPO | Attending: Internal Medicine | Admitting: Internal Medicine

## 2023-07-30 ENCOUNTER — Emergency Department: Payer: Medicare PPO

## 2023-07-30 ENCOUNTER — Other Ambulatory Visit: Payer: Self-pay

## 2023-07-30 DIAGNOSIS — R6521 Severe sepsis with septic shock: Secondary | ICD-10-CM | POA: Insufficient documentation

## 2023-07-30 DIAGNOSIS — J9621 Acute and chronic respiratory failure with hypoxia: Principal | ICD-10-CM | POA: Diagnosis present

## 2023-07-30 DIAGNOSIS — N401 Enlarged prostate with lower urinary tract symptoms: Secondary | ICD-10-CM | POA: Diagnosis not present

## 2023-07-30 DIAGNOSIS — I2510 Atherosclerotic heart disease of native coronary artery without angina pectoris: Secondary | ICD-10-CM | POA: Diagnosis present

## 2023-07-30 DIAGNOSIS — Z9981 Dependence on supplemental oxygen: Secondary | ICD-10-CM | POA: Diagnosis not present

## 2023-07-30 DIAGNOSIS — Z8249 Family history of ischemic heart disease and other diseases of the circulatory system: Secondary | ICD-10-CM

## 2023-07-30 DIAGNOSIS — J440 Chronic obstructive pulmonary disease with acute lower respiratory infection: Secondary | ICD-10-CM | POA: Diagnosis present

## 2023-07-30 DIAGNOSIS — Z91013 Allergy to seafood: Secondary | ICD-10-CM

## 2023-07-30 DIAGNOSIS — J690 Pneumonitis due to inhalation of food and vomit: Secondary | ICD-10-CM | POA: Diagnosis present

## 2023-07-30 DIAGNOSIS — J189 Pneumonia, unspecified organism: Secondary | ICD-10-CM | POA: Diagnosis present

## 2023-07-30 DIAGNOSIS — G3183 Dementia with Lewy bodies: Secondary | ICD-10-CM | POA: Diagnosis present

## 2023-07-30 DIAGNOSIS — Z7982 Long term (current) use of aspirin: Secondary | ICD-10-CM

## 2023-07-30 DIAGNOSIS — F0280 Dementia in other diseases classified elsewhere without behavioral disturbance: Secondary | ICD-10-CM | POA: Diagnosis present

## 2023-07-30 DIAGNOSIS — E1122 Type 2 diabetes mellitus with diabetic chronic kidney disease: Secondary | ICD-10-CM | POA: Diagnosis present

## 2023-07-30 DIAGNOSIS — R509 Fever, unspecified: Secondary | ICD-10-CM

## 2023-07-30 DIAGNOSIS — R590 Localized enlarged lymph nodes: Secondary | ICD-10-CM | POA: Insufficient documentation

## 2023-07-30 DIAGNOSIS — E1151 Type 2 diabetes mellitus with diabetic peripheral angiopathy without gangrene: Secondary | ICD-10-CM | POA: Diagnosis present

## 2023-07-30 DIAGNOSIS — I272 Pulmonary hypertension, unspecified: Secondary | ICD-10-CM | POA: Diagnosis present

## 2023-07-30 DIAGNOSIS — A419 Sepsis, unspecified organism: Secondary | ICD-10-CM | POA: Diagnosis present

## 2023-07-30 DIAGNOSIS — E875 Hyperkalemia: Secondary | ICD-10-CM | POA: Diagnosis present

## 2023-07-30 DIAGNOSIS — E872 Acidosis, unspecified: Secondary | ICD-10-CM | POA: Diagnosis present

## 2023-07-30 DIAGNOSIS — J441 Chronic obstructive pulmonary disease with (acute) exacerbation: Secondary | ICD-10-CM | POA: Diagnosis present

## 2023-07-30 DIAGNOSIS — I129 Hypertensive chronic kidney disease with stage 1 through stage 4 chronic kidney disease, or unspecified chronic kidney disease: Secondary | ICD-10-CM | POA: Diagnosis present

## 2023-07-30 DIAGNOSIS — I10 Essential (primary) hypertension: Secondary | ICD-10-CM | POA: Diagnosis present

## 2023-07-30 DIAGNOSIS — Z91048 Other nonmedicinal substance allergy status: Secondary | ICD-10-CM

## 2023-07-30 DIAGNOSIS — K219 Gastro-esophageal reflux disease without esophagitis: Secondary | ICD-10-CM | POA: Diagnosis present

## 2023-07-30 DIAGNOSIS — N1831 Chronic kidney disease, stage 3a: Secondary | ICD-10-CM | POA: Insufficient documentation

## 2023-07-30 DIAGNOSIS — Z96653 Presence of artificial knee joint, bilateral: Secondary | ICD-10-CM | POA: Diagnosis present

## 2023-07-30 DIAGNOSIS — Z89421 Acquired absence of other right toe(s): Secondary | ICD-10-CM

## 2023-07-30 DIAGNOSIS — I252 Old myocardial infarction: Secondary | ICD-10-CM

## 2023-07-30 DIAGNOSIS — R338 Other retention of urine: Secondary | ICD-10-CM | POA: Diagnosis not present

## 2023-07-30 DIAGNOSIS — E785 Hyperlipidemia, unspecified: Secondary | ICD-10-CM | POA: Diagnosis present

## 2023-07-30 DIAGNOSIS — G2581 Restless legs syndrome: Secondary | ICD-10-CM | POA: Diagnosis present

## 2023-07-30 DIAGNOSIS — D6959 Other secondary thrombocytopenia: Secondary | ICD-10-CM | POA: Diagnosis present

## 2023-07-30 DIAGNOSIS — R079 Chest pain, unspecified: Secondary | ICD-10-CM

## 2023-07-30 DIAGNOSIS — Z9884 Bariatric surgery status: Secondary | ICD-10-CM

## 2023-07-30 DIAGNOSIS — Z87891 Personal history of nicotine dependence: Secondary | ICD-10-CM

## 2023-07-30 DIAGNOSIS — Z6838 Body mass index (BMI) 38.0-38.9, adult: Secondary | ICD-10-CM

## 2023-07-30 DIAGNOSIS — I4510 Unspecified right bundle-branch block: Secondary | ICD-10-CM | POA: Diagnosis present

## 2023-07-30 DIAGNOSIS — Z880 Allergy status to penicillin: Secondary | ICD-10-CM

## 2023-07-30 DIAGNOSIS — Z79899 Other long term (current) drug therapy: Secondary | ICD-10-CM

## 2023-07-30 DIAGNOSIS — D631 Anemia in chronic kidney disease: Secondary | ICD-10-CM | POA: Diagnosis present

## 2023-07-30 DIAGNOSIS — J9612 Chronic respiratory failure with hypercapnia: Secondary | ICD-10-CM | POA: Diagnosis present

## 2023-07-30 DIAGNOSIS — G4733 Obstructive sleep apnea (adult) (pediatric): Secondary | ICD-10-CM | POA: Diagnosis present

## 2023-07-30 DIAGNOSIS — F0390 Unspecified dementia without behavioral disturbance: Secondary | ICD-10-CM

## 2023-07-30 DIAGNOSIS — Z9841 Cataract extraction status, right eye: Secondary | ICD-10-CM

## 2023-07-30 DIAGNOSIS — Z955 Presence of coronary angioplasty implant and graft: Secondary | ICD-10-CM

## 2023-07-30 DIAGNOSIS — Z9852 Vasectomy status: Secondary | ICD-10-CM

## 2023-07-30 DIAGNOSIS — Z9842 Cataract extraction status, left eye: Secondary | ICD-10-CM

## 2023-07-30 DIAGNOSIS — N138 Other obstructive and reflux uropathy: Secondary | ICD-10-CM | POA: Insufficient documentation

## 2023-07-30 LAB — RESP PANEL BY RT-PCR (RSV, FLU A&B, COVID)  RVPGX2
Influenza A by PCR: NEGATIVE
Influenza B by PCR: NEGATIVE
Resp Syncytial Virus by PCR: NEGATIVE
SARS Coronavirus 2 by RT PCR: NEGATIVE

## 2023-07-30 LAB — PROTIME-INR
INR: 1.1 (ref 0.8–1.2)
Prothrombin Time: 14 s (ref 11.4–15.2)

## 2023-07-30 LAB — CBC WITH DIFFERENTIAL/PLATELET
Abs Immature Granulocytes: 0.02 10*3/uL (ref 0.00–0.07)
Basophils Absolute: 0 10*3/uL (ref 0.0–0.1)
Basophils Relative: 0 %
Eosinophils Absolute: 0.2 10*3/uL (ref 0.0–0.5)
Eosinophils Relative: 2 %
HCT: 33.5 % — ABNORMAL LOW (ref 39.0–52.0)
Hemoglobin: 10.7 g/dL — ABNORMAL LOW (ref 13.0–17.0)
Immature Granulocytes: 0 %
Lymphocytes Relative: 11 %
Lymphs Abs: 1 10*3/uL (ref 0.7–4.0)
MCH: 30.1 pg (ref 26.0–34.0)
MCHC: 31.9 g/dL (ref 30.0–36.0)
MCV: 94.4 fL (ref 80.0–100.0)
Monocytes Absolute: 0.6 10*3/uL (ref 0.1–1.0)
Monocytes Relative: 6 %
Neutro Abs: 7.2 10*3/uL (ref 1.7–7.7)
Neutrophils Relative %: 81 %
Platelets: 154 10*3/uL (ref 150–400)
RBC: 3.55 MIL/uL — ABNORMAL LOW (ref 4.22–5.81)
RDW: 14.6 % (ref 11.5–15.5)
WBC: 9.1 10*3/uL (ref 4.0–10.5)
nRBC: 0 % (ref 0.0–0.2)

## 2023-07-30 LAB — TROPONIN I (HIGH SENSITIVITY)
Troponin I (High Sensitivity): 28 ng/L — ABNORMAL HIGH (ref <18)
Troponin I (High Sensitivity): 56 ng/L — ABNORMAL HIGH (ref <18)

## 2023-07-30 LAB — COMPREHENSIVE METABOLIC PANEL
ALT: 16 U/L (ref 0–44)
AST: 27 U/L (ref 15–41)
Albumin: 3.6 g/dL (ref 3.5–5.0)
Alkaline Phosphatase: 70 U/L (ref 38–126)
Anion gap: 10 (ref 5–15)
BUN: 43 mg/dL — ABNORMAL HIGH (ref 8–23)
CO2: 23 mmol/L (ref 22–32)
Calcium: 8.9 mg/dL (ref 8.9–10.3)
Chloride: 108 mmol/L (ref 98–111)
Creatinine, Ser: 1.61 mg/dL — ABNORMAL HIGH (ref 0.61–1.24)
GFR, Estimated: 44 mL/min — ABNORMAL LOW (ref >60)
Glucose, Bld: 162 mg/dL — ABNORMAL HIGH (ref 70–99)
Potassium: 4.9 mmol/L (ref 3.5–5.1)
Sodium: 141 mmol/L (ref 135–145)
Total Bilirubin: 0.6 mg/dL (ref 0.0–1.2)
Total Protein: 7.4 g/dL (ref 6.5–8.1)

## 2023-07-30 LAB — BLOOD GAS, VENOUS
Acid-base deficit: 1.7 mmol/L (ref 0.0–2.0)
Bicarbonate: 25.2 mmol/L (ref 20.0–28.0)
O2 Saturation: 74.3 %
Patient temperature: 37
pCO2, Ven: 50 mm[Hg] (ref 44–60)
pH, Ven: 7.31 (ref 7.25–7.43)
pO2, Ven: 43 mm[Hg] (ref 32–45)

## 2023-07-30 LAB — LIPASE, BLOOD: Lipase: 62 U/L — ABNORMAL HIGH (ref 11–51)

## 2023-07-30 LAB — LACTIC ACID, PLASMA
Lactic Acid, Venous: 1.6 mmol/L (ref 0.5–1.9)
Lactic Acid, Venous: 4.5 mmol/L (ref 0.5–1.9)

## 2023-07-30 LAB — BRAIN NATRIURETIC PEPTIDE: B Natriuretic Peptide: 50.8 pg/mL (ref 0.0–100.0)

## 2023-07-30 LAB — APTT: aPTT: 23 s — ABNORMAL LOW (ref 24–36)

## 2023-07-30 MED ORDER — AZELASTINE HCL 0.1 % NA SOLN: 1.0000 | Freq: Two times a day (BID) | NASAL | Status: DC | PRN

## 2023-07-30 MED ORDER — QUETIAPINE FUMARATE 25 MG PO TABS
25.0000 mg | ORAL_TABLET | Freq: Every day | ORAL | Status: DC
Start: 2023-07-30 — End: 2023-07-30

## 2023-07-30 MED ORDER — AMBRISENTAN 5 MG PO TABS
5.0000 mg | ORAL_TABLET | Freq: Every day | ORAL | Status: DC
  Administered 2023-07-30 – 2023-08-01 (×3): 5 mg via ORAL
  Filled 2023-07-30 (×4): qty 1

## 2023-07-30 MED ORDER — UMECLIDINIUM BROMIDE 62.5 MCG/ACT IN AEPB
1.0000 | INHALATION_SPRAY | Freq: Every day | RESPIRATORY_TRACT | Status: DC
  Administered 2023-07-31 – 2023-08-01 (×2): 1 via RESPIRATORY_TRACT
  Filled 2023-07-30: qty 7

## 2023-07-30 MED ORDER — ONDANSETRON HCL 4 MG PO TABS
4.0000 mg | ORAL_TABLET | Freq: Four times a day (QID) | ORAL | Status: DC | PRN
Start: 2023-07-30 — End: 2023-07-30

## 2023-07-30 MED ORDER — SODIUM CHLORIDE 0.9 % IV SOLN
1.0000 g | Freq: Once | INTRAVENOUS | Status: AC
  Administered 2023-07-30: 1 g via INTRAVENOUS
  Filled 2023-07-30: qty 10

## 2023-07-30 MED ORDER — ASPIRIN 81 MG PO TBEC
81.0000 mg | DELAYED_RELEASE_TABLET | Freq: Every day | ORAL | Status: DC
  Administered 2023-07-30: 81 mg via ORAL
  Filled 2023-07-30: qty 1

## 2023-07-30 MED ORDER — POLYETHYLENE GLYCOL 3350 17 G PO PACK: 17.0000 g | PACK | Freq: Every day | ORAL | Status: DC

## 2023-07-30 MED ORDER — SODIUM CHLORIDE 0.9 % IV SOLN
500.0000 mg | Freq: Once | INTRAVENOUS | Status: AC
  Administered 2023-07-30: 500 mg via INTRAVENOUS
  Filled 2023-07-30: qty 5

## 2023-07-30 MED ORDER — AZITHROMYCIN 500 MG IV SOLR
500.0000 mg | INTRAVENOUS | Status: DC
  Filled 2023-07-30: qty 5

## 2023-07-30 MED ORDER — ACETAMINOPHEN 500 MG PO TABS
1000.0000 mg | ORAL_TABLET | Freq: Once | ORAL | Status: AC
  Administered 2023-07-30: 1000 mg via ORAL
  Filled 2023-07-30: qty 2

## 2023-07-30 MED ORDER — ROSUVASTATIN CALCIUM 5 MG PO TABS
5.0000 mg | ORAL_TABLET | Freq: Every day | ORAL | Status: DC
  Administered 2023-07-30 – 2023-07-31 (×2): 5 mg via ORAL
  Filled 2023-07-30 (×4): qty 1

## 2023-07-30 MED ORDER — SODIUM CHLORIDE 0.9 % IV BOLUS
1000.0000 mL | Freq: Once | INTRAVENOUS | Status: AC
  Administered 2023-07-30: 1000 mL via INTRAVENOUS

## 2023-07-30 MED ORDER — OXYBUTYNIN CHLORIDE ER 5 MG PO TB24
5.0000 mg | ORAL_TABLET | Freq: Every evening | ORAL | Status: DC
  Administered 2023-07-31: 5 mg via ORAL
  Filled 2023-07-30 (×3): qty 1

## 2023-07-30 MED ORDER — OXYCODONE HCL 5 MG PO TABS: 2.5000 mg | ORAL_TABLET | ORAL | Status: DC | PRN

## 2023-07-30 MED ORDER — SODIUM CHLORIDE 0.9 % IV SOLN
2.0000 g | INTRAVENOUS | Status: DC
  Administered 2023-07-31: 2 g via INTRAVENOUS
  Filled 2023-07-30: qty 20

## 2023-07-30 MED ORDER — MIDODRINE HCL 5 MG PO TABS: 2.5000 mg | ORAL_TABLET | Freq: Three times a day (TID) | ORAL | Status: DC

## 2023-07-30 MED ORDER — ROPINIROLE HCL 0.25 MG PO TABS: 0.5000 mg | ORAL_TABLET | Freq: Every day | ORAL | Status: DC

## 2023-07-30 MED ORDER — ACETAMINOPHEN 325 MG PO TABS: 650.0000 mg | ORAL_TABLET | Freq: Four times a day (QID) | ORAL | Status: DC | PRN

## 2023-07-30 MED ORDER — DOCUSATE SODIUM 100 MG PO CAPS: 100.0000 mg | ORAL_CAPSULE | Freq: Two times a day (BID) | ORAL | Status: DC

## 2023-07-30 MED ORDER — DULOXETINE HCL 30 MG PO CPEP
90.0000 mg | ORAL_CAPSULE | Freq: Every day | ORAL | Status: DC
  Administered 2023-07-31: 90 mg via ORAL
  Filled 2023-07-30 (×2): qty 3

## 2023-07-30 MED ORDER — IPRATROPIUM BROMIDE 0.03 % NA SOLN: 2.0000 | Freq: Three times a day (TID) | NASAL | Status: DC | PRN

## 2023-07-30 MED ORDER — QUETIAPINE FUMARATE 25 MG PO TABS
25.0000 mg | ORAL_TABLET | Freq: Every day | ORAL | Status: DC
  Administered 2023-07-30 – 2023-07-31 (×2): 25 mg via ORAL
  Filled 2023-07-30 (×2): qty 1

## 2023-07-30 MED ORDER — LACTATED RINGERS IV BOLUS
1000.0000 mL | Freq: Once | INTRAVENOUS | Status: AC
  Administered 2023-07-30: 1000 mL via INTRAVENOUS

## 2023-07-30 MED ORDER — ALBUTEROL SULFATE (2.5 MG/3ML) 0.083% IN NEBU: 2.5000 mg | INHALATION_SOLUTION | Freq: Four times a day (QID) | RESPIRATORY_TRACT | Status: DC | PRN

## 2023-07-30 MED ORDER — METHYLPREDNISOLONE SODIUM SUCC 125 MG IJ SOLR
80.0000 mg | INTRAMUSCULAR | Status: DC
  Administered 2023-07-30 – 2023-08-01 (×3): 80 mg via INTRAVENOUS
  Filled 2023-07-30 (×3): qty 2

## 2023-07-30 MED ORDER — FLUTICASONE FUROATE-VILANTEROL 100-25 MCG/ACT IN AEPB
1.0000 | INHALATION_SPRAY | Freq: Every day | RESPIRATORY_TRACT | Status: DC
  Administered 2023-07-31 – 2023-08-01 (×2): 1 via RESPIRATORY_TRACT
  Filled 2023-07-30: qty 28

## 2023-07-30 MED ORDER — ENOXAPARIN SODIUM 60 MG/0.6ML IJ SOSY
55.0000 mg | PREFILLED_SYRINGE | INTRAMUSCULAR | Status: DC
  Administered 2023-07-30 – 2023-07-31 (×2): 55 mg via SUBCUTANEOUS
  Filled 2023-07-30 (×2): qty 0.6

## 2023-07-30 MED ORDER — IPRATROPIUM-ALBUTEROL 0.5-2.5 (3) MG/3ML IN SOLN
3.0000 mL | Freq: Four times a day (QID) | RESPIRATORY_TRACT | Status: DC
  Administered 2023-07-30 – 2023-08-01 (×6): 3 mL via RESPIRATORY_TRACT
  Filled 2023-07-30 (×7): qty 3

## 2023-07-30 MED ORDER — AMBRISENTAN 5 MG PO TABS
5.0000 mg | ORAL_TABLET | Freq: Every day | ORAL | Status: DC
Start: 2023-07-30 — End: 2023-07-30

## 2023-07-30 MED ORDER — PREGABALIN 75 MG PO CAPS
200.0000 mg | ORAL_CAPSULE | Freq: Three times a day (TID) | ORAL | Status: DC
  Administered 2023-07-30 – 2023-08-01 (×6): 200 mg via ORAL
  Filled 2023-07-30: qty 1
  Filled 2023-07-30: qty 4
  Filled 2023-07-30: qty 1
  Filled 2023-07-30 (×2): qty 4
  Filled 2023-07-30: qty 1

## 2023-07-30 MED ORDER — ROPINIROLE HCL ER 4 MG PO TB24
4.0000 mg | ORAL_TABLET | Freq: Every day | ORAL | Status: DC
  Administered 2023-07-30 – 2023-07-31 (×2): 4 mg via ORAL
  Filled 2023-07-30 (×5): qty 1

## 2023-07-30 MED ORDER — ACETAMINOPHEN 650 MG RE SUPP: 650.0000 mg | Freq: Four times a day (QID) | RECTAL | Status: DC | PRN

## 2023-07-30 MED ORDER — GUAIFENESIN ER 600 MG PO TB12
1200.0000 mg | ORAL_TABLET | Freq: Two times a day (BID) | ORAL | Status: DC
  Administered 2023-07-30 – 2023-08-01 (×5): 1200 mg via ORAL
  Filled 2023-07-30 (×5): qty 2

## 2023-07-30 MED ORDER — MIDODRINE HCL 5 MG PO TABS
2.5000 mg | ORAL_TABLET | Freq: Three times a day (TID) | ORAL | Status: DC
  Administered 2023-07-30 – 2023-08-01 (×5): 2.5 mg via ORAL
  Filled 2023-07-30 (×5): qty 1

## 2023-07-30 NOTE — ED Notes (Signed)
Dr. Peri Jefferson man advised the pt could be placed on nasal cannula to go to CT.The pt's bipap was removed and the pt was placed on 6 liters of O2 via nasal cannula so he could go to CT. The pt's pulse ox remained 96 to 97 percent.

## 2023-07-30 NOTE — ED Provider Notes (Signed)
[facility] Provider Note    Event Date/Time   First MD Initiated Contact with Patient 07/30/23 [phone]     (approximate)   History   Respiratory Distress  Level 5 caveat:  history/ROS limited by acute/critical illness  HPI Brian Kramer is a 78 y.o. male who reportedly has a history of dementia, a prior heart attack, chronic respiratory failure on 2 L of oxygen at baseline.  He presents by EMS for evaluation of chest pain and shortness of breath.  Shortly after arrival the patient's wife and daughter arrived.  The wife said that the patient reportedly was having left-sided chest pressure during the night, then he developed a severe headache but this is apparently after he took some nitroglycerin at home.  He then developed shortness of breath which is when his wife found him.  He is also twitching which she does not normally do, with some involuntary movements all throughout his extremities.  She said that sometimes he will do that if he "gets stressed" but not to this degree.  EMS found that he was 70% on his 2 L of oxygen at home.  Nonrebreather did not help so they switched him to CPAP.  They gave him Solu-Medrol 125 mg IV and route to the hospital.  His oxygen saturation on the CPAP was 85 to 87% upon arrival to the ED.  He is awake and alert but seems confused although he has dementia and his baseline is unclear.  He said he is not in pain any longer but he did not feel better due to the shortness of breath.  He and his wife both said they think he has been having some recent fevers.     Physical Exam   Triage Vital Signs: ED Triage Vitals  Encounter Vitals Group     BP 07/30/23 0630 (!) 129/41     Systolic BP Percentile --      Diastolic BP Percentile --      Pulse Rate 07/30/23 0630 (!) 118     Resp 07/30/23 0630 (!) 21     Temp 07/30/23 0638 (!) 101.6 F (38.7 C)     Temp Source 07/30/23 0638 Rectal     SpO2 07/30/23 0627 (!) 75 %     Weight  07/30/23 0637 117.9 kg (260 lb)     Height 07/30/23 0637 1.753 m (5\' 9" )     Head Circumference --      Peak Flow --      Pain Score 07/30/23 0634 5     Pain Loc --      Pain Education --      Exclude from Growth Chart --     Most recent vital signs: Vitals:   07/30/23 0638 07/30/23 0729  BP:    Pulse:    Resp:    Temp: (!) 101.6 F (38.7 C) (!) 100.9 F (38.3 C)  SpO2:      General: Awake but ill-appearing.  Able to answer some simple questions. CV:  Good peripheral perfusion.  Tachycardic, regular rhythm Resp:  Tachypnea, increased respiratory effort with intercostal retractions and accessory muscle usage.  There is very minimal wheezing throughout but with good air movement. Abd:  No distention.  No tenderness to palpation of the abdomen.   ED Results / Procedures / Treatments   Labs (all labs ordered are listed, but only abnormal results are displayed) Labs Reviewed  COMPREHENSIVE METABOLIC PANEL - Abnormal; Notable for the following components:  Result Value   Glucose, Bld 162 (*)    BUN 43 (*)    Creatinine, Ser 1.61 (*)    GFR, Estimated 44 (*)    All other components within normal limits  CBC WITH DIFFERENTIAL/PLATELET - Abnormal; Notable for the following components:   RBC 3.55 (*)    Hemoglobin 10.7 (*)    HCT 33.5 (*)    All other components within normal limits  APTT - Abnormal; Notable for the following components:   aPTT 23 (*)    All other components within normal limits  LIPASE, BLOOD - Abnormal; Notable for the following components:   Lipase 62 (*)    All other components within normal limits  TROPONIN I (HIGH SENSITIVITY) - Abnormal; Notable for the following components:   Troponin I (High Sensitivity) 28 (*)    All other components within normal limits  RESP PANEL BY RT-PCR (RSV, FLU A&B, COVID)  RVPGX2  CULTURE, BLOOD (ROUTINE X 2)  CULTURE, BLOOD (ROUTINE X 2)  LACTIC ACID, PLASMA  PROTIME-INR  BRAIN NATRIURETIC PEPTIDE  LACTIC  ACID, PLASMA  BLOOD GAS, VENOUS  URINALYSIS, W/ REFLEX TO CULTURE (INFECTION SUSPECTED)     EKG  ED ECG REPORT I, Loleta Rose, the attending physician, personally viewed and interpreted this ECG.  Date: 07/30/2023 EKG Time: 6:34 AM Rate: 114 Rhythm: Sinus tachycardia QRS Axis: normal Intervals: Incomplete right bundle branch block ST/T Wave abnormalities: Non-specific ST segment / T-wave changes, but no clear evidence of acute ischemia. Narrative Interpretation: no definitive evidence of acute ischemia; does not meet STEMI criteria.    RADIOLOGY 1 view chest x-ray pending at time of transfer of care   PROCEDURES:  Critical Care performed: Yes, see critical care procedure note(s)  .Critical Care  Performed by: Loleta Rose, MD Authorized by: Loleta Rose, MD   Critical care provider statement:    Critical care time (minutes):  30   Critical care time was exclusive of:  Separately billable procedures and treating other patients   Critical care was necessary to treat or prevent imminent or life-threatening deterioration of the following conditions:  Respiratory failure   Critical care was time spent personally by me on the following activities:  Development of treatment plan with patient or surrogate, evaluation of patient's response to treatment, examination of patient, obtaining history from patient or surrogate, ordering and performing treatments and interventions, ordering and review of laboratory studies, ordering and review of radiographic studies, pulse oximetry, re-evaluation of patient's condition and review of old charts .1-3 Lead EKG Interpretation  Performed by: Loleta Rose, MD Authorized by: Loleta Rose, MD     Interpretation: abnormal     ECG rate:  119   ECG rate assessment: tachycardic     Rhythm: sinus tachycardia     Ectopy: none     Conduction: normal       IMPRESSION / MDM / ASSESSMENT AND PLAN / ED COURSE  I reviewed the triage vital  signs and the nursing notes.                              Differential diagnosis includes, but is not limited to, COPD exacerbation, CHF exacerbation, ACS, PE, pneumonia, respiratory viral illness  Patient's presentation is most consistent with acute presentation with potential threat to life or bodily function.  Labs/studies ordered: EKG, 1 view chest x-ray, lactic acid, BNP, lipase, pro time-INR, APTT, CBC with differential, high-sensitivity troponin, VBG, CMP,  urinalysis    (Note:  hospital course my include additional interventions and/or labs/studies not listed above.)   Patient has a rectal temperature of 101.6, tachycardia, tachypnea, and hypoxia even on his baseline supplementary oxygen.  However, before initiating broad-spectrum empiric antibiotics, it is reasonable to look for a source given the prevalence of respiratory viral illnesses in the community at this time.  Broad "possible sepsis" workup is pending.  Patient is breathing easier and is not hypoxic on BiPAP.  No indication for intubation at this time.  Patient is critically ill and will require admission.  Wife and daughter agree with the plan.  The patient is on the cardiac monitor to evaluate for evidence of arrhythmia and/or significant heart rate changes.   Clinical Course as of 07/30/23 0734  Thu Jul 30, 2023  0704 Transferring ED care to Dr. Derrill Kay.  We discussed the case and he will follow-up on all of the patient's lab work and clinical status to determine the appropriate additional treatment and evaluation needed.  No lab results or imaging results are available at the time of transfer of care [CF]    Clinical Course User Index [CF] Loleta Rose, MD     FINAL CLINICAL IMPRESSION(S) / ED DIAGNOSES   Final diagnoses:  Acute on chronic respiratory failure with hypoxia (HCC)  Fever, unspecified fever cause  Chronic dementia (HCC)  Chest pain, unspecified type     Rx / DC Orders   ED Discharge Orders      None        Note:  This document was prepared using Dragon voice recognition software and may include unintentional dictation errors.   Loleta Rose, MD 07/30/23 [phone]

## 2023-07-30 NOTE — ED Provider Notes (Signed)
Received sign out from Dr. York Cerise. CXR appeared consistent with pneumonia. Discussed this with patient and family. Will start IV antibiotics. Discussed with Dr. Chipper Herb with the hospitalist service who will evaluate for admission.   Phineas Semen, MD 07/30/23 1020

## 2023-07-30 NOTE — ED Notes (Signed)
MD made aware lactic acid 4.5 at this time.

## 2023-07-30 NOTE — ED Notes (Signed)
Family concerned about pt not voiding since 0530 this am--pt was checked, he is dry, urinal given and pt unable to void.  Bladder scan done to see if pt has urinary retention--bladder scanned for of urine.

## 2023-07-30 NOTE — Progress Notes (Signed)
Pt weaned of emergent BiPAP to 5L [place]. Pt tolerating well. Will continue to monitor

## 2023-07-30 NOTE — Progress Notes (Signed)
PHARMACIST - PHYSICIAN COMMUNICATION   CONCERNING: Methylprednisolone IV    Current order: Methylprednisolone IV 40 mg q12h     DESCRIPTION: Per [place] Protocol:   IV methylprednisolone will be converted to either a q12h or q24h frequency with the same total daily dose (TDD).  Ordered Dose: 1 to 125 mg TDD; convert to: TDD q24h.  Ordered Dose: 126 to 250 mg TDD; convert to: TDD div q12h.  Ordered Dose: >250 mg TDD; DAW.  Order has been adjusted to: Methylprednisolone IV 80 mg q24h   Barrie Folk , PharmD Clinical Pharmacist  07/30/2023 9:22 AM

## 2023-07-30 NOTE — ED Notes (Signed)
Pt remains hypotensive after fluid bolus. Dr. Chipper Herb has been notified of the above.  Pt does appear to be asymptomatic and at his baseline.

## 2023-07-30 NOTE — Evaluation (Addendum)
Physical Therapy Evaluation Patient Details Name: Brian Kramer MRN: [id number] DOB: [birth year] Today's Date: 07/30/2023  History of Present Illness  Pt is admitted for pneumonia with complaints of SOB symptoms. History includes dementia, CKD, COPD, CRF on 2L of O2 chronic, and CAD  Clinical Impression  Pt is a pleasant 78 year old male who was admitted for pneumonia. Pt performs bed mobility with min assist and transfers with cga and RW. Pt initially with BP at 82/54 in supine, attempted in standing, however unable to obtain accurate reading. Pt does report dizziness with mobility. Further mobility efforts deferred at this time. Pt demonstrates deficits with strength/mobility/endurance. All mobility performed on 5L of O2 with sats dipping to 87% with standing. Pt has very supportive family and can assist at home. Would benefit from skilled PT to address above deficits and promote optimal return to PLOF. Pt will continue to receive skilled PT services while admitted and will defer to TOC/care team for updates regarding disposition planning.       If plan is discharge home, recommend the following: A lot of help with walking and/or transfers;A lot of help with bathing/dressing/bathroom;Assist for transportation;Help with stairs or ramp for entrance   Can travel by private vehicle        Equipment Recommendations None recommended by PT  Recommendations for Other Services       Functional Status Assessment Patient has had a recent decline in their functional status and demonstrates the ability to make significant improvements in function in a reasonable and predictable amount of time.     Precautions / Restrictions Precautions Precautions: Fall Recall of Precautions/Restrictions: Intact Restrictions Weight Bearing Restrictions Per Provider Order: No      Mobility  Bed Mobility Overal bed mobility: Needs Assistance Bed Mobility: Supine to Sit, Sit to Supine     Supine to  sit: Min assist Sit to supine: Min assist   General bed mobility comments: needs slight assist with B LE. Once seated at EOB, upright posture noted. Slight dizziness noted    Transfers Overall transfer level: Needs assistance Equipment used: Rolling walker (2 wheels) Transfers: Sit to/from Stand Sit to Stand: Contact guard assist           General transfer comment: able to stand from elevated stretcher. Increased dizziness noted. BP attempted to take, however then gave inaccurate reading. Pt needed to sit after 2 minutes. Unable to further progress mobility    Ambulation/Gait               General Gait Details: not safe due to low BP and complaints of dizziness. Would anticipate need for RW for safe mobility  Stairs            Wheelchair Mobility     Tilt Bed    Modified Rankin (Stroke Patients Only)       Balance Overall balance assessment: Needs assistance, History of Falls Sitting-balance support: Feet unsupported, Bilateral upper extremity supported Sitting balance-Leahy Scale: Fair     Standing balance support: Bilateral upper extremity supported Standing balance-Leahy Scale: Fair                               Pertinent Vitals/Pain Pain Assessment Pain Assessment: No/denies pain    Home Living Family/patient expects to be discharged to:: Private residence Living Arrangements: Spouse/significant other Available Help at Discharge: Family;Available 24 hours/day Type of Home: House Home Access: Ramped entrance  Home Layout: One level Home Equipment: Cane - single point;Crutches;Shower seat;Rollator (4 wheels);Wheelchair - manual;Wheelchair - power;Grab bars - tub/shower;Grab bars - toilet;Lift chair;Rolling Walker (2 wheels)      Prior Function Prior Level of Function : Needs assist;History of Falls (last six months)             Mobility Comments: reports 3 falls in last 6 months, typically uses [facility] for short distances  and rollator for longer distances. Uses electric WC for community and appointments ADLs Comments: indep, however reports it is becoming increasingly harder to manage     Extremity/Trunk Assessment   Upper Extremity Assessment Upper Extremity Assessment: Generalized weakness (B UE grossly 4/5)    Lower Extremity Assessment Lower Extremity Assessment: Generalized weakness (B LE grossly 3/5; +neuropathy)       Communication   Communication Communication: No apparent difficulties    Cognition Arousal: Alert Behavior During Therapy: WFL for tasks assessed/performed   PT - Cognitive impairments: No apparent impairments                       PT - Cognition Comments: very pleasant and agreeable to session Following commands: Intact       Cueing Cueing Techniques: Verbal cues     General Comments      Exercises Other Exercises Other Exercises: able to take side steps up towards HOB and attempted standing BP. Further mobility limited due to dizziness. O2 sats at 5L with 87% with mobility   Assessment/Plan    PT Assessment Patient needs continued PT services  PT Problem List Decreased strength;Decreased activity tolerance;Decreased balance;Decreased mobility;Cardiopulmonary status limiting activity       PT Treatment Interventions DME instruction;Gait training;Therapeutic exercise;Balance training    PT Goals (Current goals can be found in the Care Plan section)  Acute Rehab PT Goals Patient Stated Goal: to go home PT Goal Formulation: With patient Time For Goal Achievement: 08/13/23 Potential to Achieve Goals: Good    Frequency Min 1X/week     Co-evaluation               AM-PAC PT "6 Clicks" Mobility  Outcome Measure Help needed turning from your back to your side while in a flat bed without using bedrails?: A Little Help needed moving from lying on your back to sitting on the side of a flat bed without using bedrails?: A Little Help needed  moving to and from a bed to a chair (including a wheelchair)?: A Lot Help needed standing up from a chair using your arms (e.g., wheelchair or bedside chair)?: A Little Help needed to walk in hospital room?: A Lot Help needed climbing 3-5 steps with a railing? : Total 6 Click Score: 14    End of Session Equipment Utilized During Treatment: Oxygen Activity Tolerance: Patient tolerated treatment well Patient left: in bed;with nursing/sitter in room;with family/visitor present Nurse Communication: Mobility status PT Visit Diagnosis: Unsteadiness on feet (R26.81);Muscle weakness (generalized) (M62.81);History of falling (Z91.81);Difficulty in walking, not elsewhere classified (R26.2);Dizziness and giddiness (R42)    Time: 4098-1191 PT Time Calculation (min) (ACUTE ONLY): 28 min   Charges:   PT Evaluation $PT Eval Moderate Complexity: 1 Mod PT Treatments $Therapeutic Exercise: 8-22 mins PT General Charges $$ ACUTE PT VISIT: 1 Visit         Elizabeth Palau, PT, DPT, GCS [phone]   Ayssa Bentivegna 07/30/2023, 3:27 PM

## 2023-07-30 NOTE — ED Triage Notes (Signed)
Pt arrived from home BIB [facility], called out for CP around 3am. CP 8/10 chest pressure  to left side chest. Pt took nitroglycerin PTA w/some relief Wife noted pt was having "twitching", which was new. Pt A&O x4 on EMS arrival, pt then reported SOB, wears 2L [place] chronically,  70 % on EMS arrival, attempted NRB w/o success, then pt switch to CPAP with 75%. Reports wheezing throughout, gave 125 mg Solumedrol w/o much improvement, pt alert on arrival, reports still having CP and feels SOB, pt switch over to BiPAP with RT at the bedside. Pt believes he may have some fevers at home as he has not been felling well. Hx of CHF & COPD  EMS Vitals   EKG RBBB 155/82 120 26 RR  Meds PTA 324 ASA 125 mg Solumedrol

## 2023-07-30 NOTE — H&P (Signed)
History and Physical    Sabrina Arriaga ZOX:[id number] DOB: 07-30-1945 DOA: 07/30/2023  PCP: Marisue Ivan, MD (Confirm with patient/family/NH records and if not entered, this has to be entered at [facility] point of entry) Patient coming from: Home  I have personally briefly reviewed patient's old medical records in [facility] Link  Chief Complaint: SOB  HPI: Brian Kramer is a 78 y.o. male with medical history significant of advanced dementia, CKD stage IIIa, COPD with chronic hypoxic respiratory failure on 2 L continuously, pulmonary hypertension on endothelin antagonist, restless leg syndrome, CAD status post stenting, brought in by family member for acute onset of respiratory distress.  Symptoms started this morning, patient woke up with severe shortness of breath, and right-sided chest pain sharp-like worsening with cough.2.  At baseline patient does have dementia, and chronic ambulation impairment requiring either cane or roller walker at home to ambulate.  And patient does occasionally have cough after eat solid food but not recently according to the family.  No recent sick contact.  And patient appeared to be at his baseline until this morning. ED Course: Temperature 101.6, tachycardia, blood pressure 129/41, O2 saturation 75% on room air and patient was started on BiPAP 14/8.  Chest x-ray showed left lower lobe pneumonia.  Blood work creatinine 1.6 compared to baseline 1.3, hemoglobin 10.7, WBC 9.1.  Patient was started on ceftriaxone  Review of Systems: As per HPI otherwise 14 point review of systems negative.   Past Medical History:  Diagnosis Date   Anemia    Anginal pain (HCC)    Anxiety    Arthritis    Bilateral renal cysts    CKD (chronic kidney disease), stage III (HCC)    COPD (chronic obstructive pulmonary disease) (HCC)    Coronary artery disease    a.) MI 03/1995 - no PCI; b.) LHC/PCI 1997 - stent (unknown type) x 1 to RCA; c.) LHC/PCI 2000 - stent (unknown  type) x 1 to LCx; d.) LHC 04/02/2010 - insig CAD with widely patent stents; medical mgmt; e.) LHC 04/30/2015 - patent stents; insig CAD; medical mgmt; f.) R/LHC 05/01/20: EF 60%, norm cors.   Depression    Diastolic dysfunction 09/05/2021   a.)  TTE 09/05/2021: EF 50%, mild BAE, trivial PR, mild MR/TR; pHTN (RVSP 50 mmHg), G1DD.   Essential hypertension 04/08/2021   GERD (gastroesophageal reflux disease)    a.) improved s/p sleeve gastrectomy; no daily Tx   History of hiatal hernia 2004   a.) s/p repair   History of obstructive sleep apnea    a.) resolved s/p sleeve gastrectomy; no longer requires nocturnal PAP therapy   HLD (hyperlipidemia)    Incomplete right bundle branch block (RBBB)    Insomnia    Lewy body dementia (HCC)    Long term current use of aspirin    Loosening of knee joint prosthesis (HCC)    Migraines    Myocardial infarction (HCC) 03/1995   a.) full details unknown; LHC performed, however no PCI performed.   On supplemental oxygen by nasal cannula    a.) 2L/[place] PRN   PAD (peripheral artery disease) (HCC) 04/08/2021   Peripheral neuropathy    Pneumonia    Presbycusis    PUD (peptic ulcer disease)    Pulmonary hypertension (HCC)    a.) takes ambrisentan; b.)  R/LHC 05/01/2020: EF 60%, mean PA 30, mean PCWP 16, RVEDP 19, Ao sat 95, PA sat 74, CO 8.2, PVR 137, SVR 736; c.)  TTE 09/05/2021: EF  50%, RVSP 50 mmHg.   RLS (restless legs syndrome)    a.) takes ropinirole   SOB (shortness of breath) 06/28/2022   Thrombocytopenia (HCC)    Tremor    Type 2 diabetes, diet controlled [facility])     Past Surgical History:  Procedure Laterality Date   AMPUTATION TOE Right 12/20/2021   Procedure: AMPUTATION TOE - SECOND AND THIRD - 28825;  Surgeon: Linus Galas, DPM;  Location: [facility] ORS;  Service: Podiatry;  Laterality: Right;   CARPOMETACARPAL ([facility]) FUSION OF THUMB Right 04/08/2018   Procedure: CARPOMETACARPAL [facility]) FUSION OF THUMB;  Surgeon: Kennedy Bucker, MD;  Location: [facility]  ORS;  Service: Orthopedics;  Laterality: Right;   CARPOMETACARPAL ([facility]) FUSION OF THUMB Left 12/02/2018   Procedure: CARPOMETACARPAL ([facility]) FUSION OF  LEFT THUMB, DIABETIC;  Surgeon: Kennedy Bucker, MD;  Location: [facility] ORS;  Service: Orthopedics;  Laterality: Left;   CATARACT EXTRACTION Bilateral 2015   COLONOSCOPY     COLONOSCOPY WITH PROPOFOL N/A 05/19/2018   Procedure: COLONOSCOPY WITH PROPOFOL;  Surgeon: Toledo, Boykin Nearing, MD;  Location: [facility] ENDOSCOPY;  Service: Gastroenterology;  Laterality: N/A;   CORONARY ANGIOPLASTY WITH STENT PLACEMENT Left 1997   Procedure: CORONARY ANGIOPLASTY WITH STENT PLACEMENT; stent x1 (unknown type) to RCA   CORONARY ANGIOPLASTY WITH STENT PLACEMENT Left 2000   Procedure: CORONARY ANGIOPLASTY WITH STENT PLACEMENT; stent x1 (unknown type) to LCx   EXCISION PARTIAL PHALANX Left 10/04/2021   Procedure: EXCISION PARTIAL PHALANX - 2, 3, 4;  Surgeon: Linus Galas, DPM;  Location: [facility] ORS;  Service: Podiatry;  Laterality: Left;   EYE SURGERY     cataract bilateral with lens   FLEXOR TENOTOMY  Right 12/20/2021   Procedure: FLEXOR TENOTOMY - FOURTH TOE - 82956;  Surgeon: Linus Galas, DPM;  Location: [facility] ORS;  Service: Podiatry;  Laterality: Right;   HAMMER TOE SURGERY Right 01/15/2018   Procedure: HAMMER TOE CORRECTION-2ND & 3RD;  Surgeon: Recardo Evangelist, DPM;  Location: [facility] ORS;  Service: Podiatry;  Laterality: Right;   HEEL SPUR RESECTION  2012   INGUINAL HERNIA REPAIR Bilateral    KNEE ARTHROSCOPY Right 2005   KNEE ARTHROSCOPY Left 2006   LAPAROSCOPIC GASTRIC SLEEVE RESECTION N/A 03/17/2016   LEFT HEART CATH AND CORONARY ANGIOGRAPHY Left 04/02/2010   Procedure: LEFT HEART CATH AND CORONARY ANGIOGRAPHY; Location: [facility]; Surgeon: Phil Dopp, MD   LEFT HEART CATH AND CORONARY ANGIOGRAPHY Left 04/30/2015   Procedure: LEFT HEART CATH AND CORONARY ANGIOGRAPHY; Location: [facility]; Surgeon: Aletha Halim, MD   RIGHT AND LEFT HEART CATH Left 05/01/2020   Procedure: RIGHT  AND LEFT HEART CATH;  Surgeon: Alwyn Pea, MD;  Location: [facility] INVASIVE CV LAB;  Service: Cardiovascular;  Laterality: Left;   Right shoulder AC separation surgery in 1995 Right    Stem Cells to left heel  2017   TONSILLECTOMY     TOTAL KNEE ARTHROPLASTY Left 2007   TOTAL KNEE ARTHROPLASTY Right 2015   TOTAL KNEE REVISION Left 04/06/2023   Procedure: Left total knee revision, both components;  Surgeon: Reinaldo Berber, MD;  Location: [facility] ORS;  Service: Orthopedics;  Laterality: Left;   ULNAR TUNNEL RELEASE Left 04/14/2019   Procedure: LEFT CUBITAL TUNNEL RELEASE;  Surgeon: Kennedy Bucker, MD;  Location: [facility] ORS;  Service: Orthopedics;  Laterality: Left;   UPPER GI ENDOSCOPY  2017   VASECTOMY     1970s   WEIL OSTEOTOMY Right 01/15/2018   Procedure: WEIL OSTEOTOMY-2ND & 3RD;  Surgeon: Recardo Evangelist, DPM;  Location: [facility] ORS;  Service: Podiatry;  Laterality: Right;     reports that he quit smoking about 29 years ago. His smoking use included cigarettes. He has never used smokeless tobacco. He reports current alcohol use of about 1.0 - 2.0 standard drink of alcohol per week. He reports that he does not use drugs.  Allergies  Allergen Reactions   Tape Other (See Comments)    Round, white EKG pads only - "ate holes in my skin" 1997 when stays on skin for longer period Any tape OK if it doesn't stay on too long   Penicillins Itching, Rash and Other (See Comments)    Has patient had a PCN reaction causing immediate rash, facial/tongue/throat swelling, SOB or lightheadedness with hypotension: Yes Has patient had a PCN reaction causing severe rash involving mucus membranes or skin necrosis: No Has patient had a PCN reaction that required hospitalization: No Has patient had a PCN reaction occurring within the last 10 years: No If all of the above answers are "NO", then may proceed with Cephalosporin use.    Shellfish Allergy Rash and Other (See Comments)    PT CAN NOT EAT CRAB or  lobster, BUT CAN EAT shrimp. No problems with betadine or iodine    Family History  Problem Relation Age of Onset   Hypertension Mother    Hypertension Father    Stroke Father    Hypertension Maternal Grandmother      Prior to Admission medications   Medication Sig Start Date End Date Taking? Authorizing Provider  acetaminophen (TYLENOL) 500 MG tablet Take 2 tablets (1,000 mg total) by mouth every 8 (eight) hours. 04/07/23   Evon Slack, PA-C  acetaminophen (TYLENOL) 500 MG tablet Take 2 tablets (1,000 mg total) by mouth every 8 (eight) hours. 04/08/23   Evon Slack, PA-C  albuterol (VENTOLIN HFA) 108 (90 Base) MCG/ACT inhaler Inhale 2 puffs into the lungs every 6 (six) hours as needed for wheezing or shortness of breath.    [provider]  ambrisentan (LETAIRIS) 5 MG tablet Take 5 mg by mouth daily. 02/21/21   [provider]  aspirin EC 81 MG tablet Take 81 mg by mouth at bedtime.     [provider]  azelastine (ASTELIN) 0.1 % nasal spray Place 1 spray into both nostrils 2 (two) times daily as needed for rhinitis or allergies. Use in each nostril as directed    [provider]  azithromycin (ZITHROMAX) 250 MG tablet Take 1 tablet (250 mg total) by mouth daily. 04/08/23   Evon Slack, PA-C  Calcium 500-100 MG-UNIT CHEW Chew 500 mg by mouth 2 (two) times daily. Calcium is bariatric chewable with vitamin D included    [provider]  docusate sodium (COLACE) 100 MG capsule Take 1 capsule (100 mg total) by mouth 2 (two) times daily. 04/07/23   Evon Slack, PA-C  DULoxetine HCl 30 MG CSDR Take 90 mg by mouth daily. 04/19/20   [provider]  enoxaparin (LOVENOX) 40 MG/0.4ML injection Inject 0.4 mLs (40 mg total) into the skin daily for 14 days. 04/08/23 04/22/23  Evon Slack, PA-C  ferrous sulfate 325 (65 FE) MG tablet Take 325 mg by mouth daily with breakfast.    [provider]   Fluticasone-Umeclidin-Vilant (TRELEGY ELLIPTA) 100-62.5-25 MCG/ACT AEPB Inhale 1 puff into the lungs daily. 01/24/21   [provider]  ipratropium (ATROVENT) 0.03 % nasal spray Place 2 sprays into both nostrils 3 (three) times daily as needed for rhinitis.  [provider]  LYRICA 200 MG capsule Take 200 mg by mouth 3 (three) times daily.     [provider]  mometasone (ELOCON) 0.1 % lotion 1-4 drops See admin instructions. 1-4 drops in each ear as needed for itching 08/19/21   [provider]  Multiple Vitamins-Minerals (BARIATRIC MULTIVITAMINS/IRON PO) Take 1 tablet by mouth daily.    [provider]  nitroGLYCERIN (NITROSTAT) 0.4 MG SL tablet Place 0.4 mg under the tongue every 5 (five) minutes as needed for chest pain.  10/21/12   [provider]  ondansetron (ZOFRAN) 4 MG tablet Take 1 tablet (4 mg total) by mouth every 6 (six) hours as needed for nausea. 04/07/23   Evon Slack, PA-C  oxyCODONE (OXY IR/ROXICODONE) 5 MG immediate release tablet Take 0.5-1 tablets (2.5-5 mg total) by mouth every 4 (four) hours as needed for severe pain (pain score 7-10). 04/08/23   Evon Slack, PA-C  OXYGEN Inhale 2 L into the lungs at bedtime.    [provider]  polyethylene glycol (MIRALAX / GLYCOLAX) 17 g packet Take 17 g by mouth daily. 04/09/23   Evon Slack, PA-C  QUEtiapine (SEROQUEL) 100 MG tablet Take 25 mg by mouth at bedtime.    [provider]  rOPINIRole (REQUIP XL) 4 MG 24 hr tablet Take 4 mg by mouth at bedtime.    [provider]  rosuvastatin (CRESTOR) 5 MG tablet Take 5 mg by mouth daily.  08/22/17   [provider]  traMADol (ULTRAM) 50 MG tablet Take 1 tablet (50 mg total) by mouth every 6 (six) hours as needed for moderate pain (pain score 4-6). 04/07/23   Evon Slack, PA-C    Physical Exam: Vitals:   07/30/23 0630 07/30/23 0637 07/30/23 0638 07/30/23 0729  BP: (!) 129/41      Pulse: (!) 118     Resp: (!) 21     Temp:   (!) 101.6 F (38.7 C) (!) 100.9 F (38.3 C)  TempSrc:   Rectal Axillary  SpO2: 92%     Weight:  117.9 kg    Height:  5\' 9"  (1.753 m)      Constitutional: NAD, calm, comfortable Vitals:   07/30/23 0630 07/30/23 0637 07/30/23 0638 07/30/23 0729  BP: (!) 129/41     Pulse: (!) 118     Resp: (!) 21     Temp:   (!) 101.6 F (38.7 C) (!) 100.9 F (38.3 C)  TempSrc:   Rectal Axillary  SpO2: 92%     Weight:  117.9 kg    Height:  5\' 9"  (1.753 m)     Eyes: PERRL, lids and conjunctivae normal ENMT: Mucous membranes are moist. Posterior pharynx clear of any exudate or lesions.Normal dentition.  Neck: normal, supple, no masses, no thyromegaly Respiratory: Diminished breathing sounds on the right side of the lungs with crackles on right side, scattered wheezing, increasing respiratory effort. No accessory muscle use.  Cardiovascular: Regular rate and rhythm, no murmurs / rubs / gallops. No extremity edema. 2+ pedal pulses. No carotid bruits.  Abdomen: no tenderness, no masses palpated. No hepatosplenomegaly. Bowel sounds positive.  Musculoskeletal: no clubbing / cyanosis. No joint deformity upper and lower extremities. Good ROM, no contractures. Normal muscle tone.  Skin: no rashes, lesions, ulcers. No induration Neurologic: CN 2-12 grossly intact. Sensation intact, DTR normal. Strength 5/5 in all 4.  Psychiatric: Normal judgment and insight. Alert and oriented x 3. Normal mood.  Labs on Admission: I have personally reviewed following labs and imaging studies  CBC: Recent Labs  Lab 07/30/23 0636  WBC 9.1  NEUTROABS 7.2  HGB 10.7*  HCT 33.5*  MCV 94.4  PLT 154   Basic Metabolic Panel: Recent Labs  Lab 07/30/23 0636  NA 141  K 4.9  CL 108  CO2 23  GLUCOSE 162*  BUN 43*  CREATININE 1.61*  CALCIUM 8.9   GFR: Estimated Creatinine Clearance: 47.9 mL/min (A) (by C-G formula based on SCr of 1.61 mg/dL (H)). Liver Function  Tests: Recent Labs  Lab 07/30/23 0636  AST 27  ALT 16  ALKPHOS 70  BILITOT 0.6  PROT 7.4  ALBUMIN 3.6   Recent Labs  Lab 07/30/23 0636  LIPASE 62*   No results for input(s): "AMMONIA" in the last 168 hours. Coagulation Profile: Recent Labs  Lab 07/30/23 0636  INR 1.1   Cardiac Enzymes: No results for input(s): "CKTOTAL", "CKMB", "CKMBINDEX", "TROPONINI" in the last 168 hours. BNP (last 3 results) No results for input(s): "PROBNP" in the last 8760 hours. HbA1C: No results for input(s): "HGBA1C" in the last 72 hours. CBG: No results for input(s): "GLUCAP" in the last 168 hours. Lipid Profile: No results for input(s): "CHOL", "HDL", "LDLCALC", "TRIG", "CHOLHDL", "LDLDIRECT" in the last 72 hours. Thyroid Function Tests: No results for input(s): "TSH", "T4TOTAL", "FREET4", "T3FREE", "THYROIDAB" in the last 72 hours. Anemia Panel: No results for input(s): "VITAMINB12", "FOLATE", "FERRITIN", "TIBC", "IRON", "RETICCTPCT" in the last 72 hours. Urine analysis:    Component Value Date/Time   COLORURINE YELLOW (A) 03/23/2023 0905   APPEARANCEUR HAZY (A) 03/23/2023 0905   LABSPEC 1.018 03/23/2023 0905   PHURINE 5.0 03/23/2023 0905   GLUCOSEU NEGATIVE 03/23/2023 0905   HGBUR NEGATIVE 03/23/2023 0905   BILIRUBINUR NEGATIVE 03/23/2023 0905   KETONESUR NEGATIVE 03/23/2023 0905   PROTEINUR NEGATIVE 03/23/2023 0905   NITRITE NEGATIVE 03/23/2023 0905   LEUKOCYTESUR NEGATIVE 03/23/2023 0905    Radiological Exams on Admission: DG Chest Port 1 View Result Date: 07/30/2023 CLINICAL DATA:  Chest pressure on the left side.  Chest pain. EXAM: PORTABLE CHEST 1 VIEW COMPARISON:  04/07/2023 FINDINGS: Low volume film. The cardio pericardial silhouette is enlarged. Vascular congestion with diffuse interstitial opacity is progressive in the interval. There is new right base airspace disease with possible small right pleural effusion. No acute bony abnormality. Telemetry leads overlie the chest.  IMPRESSION: 1. Low volume film with progressive vascular congestion and diffuse interstitial opacity. Imaging features suggest edema. 2. New right base airspace disease compatible with pneumonia and possible small right pleural effusion. Electronically Signed   By: Kennith Center M.D.   On: 07/30/2023 07:16    EKG: Independently reviewed.  Sinus tachycardia, chronic nonspecific ST changes on multiple leads, chronic RBBB  Assessment/Plan Principal Problem:   Pneumonia Active Problems:   Acute on chronic respiratory failure with hypoxia (HCC)   COPD with acute exacerbation (HCC)   CAP (community acquired pneumonia)  (please populate well all problems here in Problem List. (For example, if patient is on BP meds at home and you resume or decide to hold them, it is a problem that needs to be her. Same for CAD, COPD, HLD and so on)  Acute hypoxic respiratory failure -Secondary to RLL CAP -CT chest showed consolidation infiltrates on right lower lobe, no pleural effusion no empyema -Continue current antibiotic coverage with ceftriaxone and azithromycin -Incentive spirometry and flutter valve -Expect wean off BiPAP in the next few hours. -Other  DDx, appears to have increased risk of aspiration, will consult speech evaluation  Sepsis without acute endorgan damage -Sepsis evidenced by tachycardia, new onset fever, source of infection is RLL pneumonia -Blood pressure stable, hold off IV fluid -Antibiotics as above  Acute COPD exacerbation -3 secondary to pneumonia -Antibiotics as above -IV Solu-Medrol for today -ICS and LABA -DuoNebs and as needed albuterol  Pulmonary hypertension -Continue endothelin antagonist  CAD -Chest pain appears to be pleural and secondary to right-sided pneumonia, empyema ruled out, noncardiac.  Troponin negative x 2, EKG showed chronic nonspecific ST changes otherwise no new acute ST changes.  ACS ruled out  CKD stage IIIa -Euvolemic, creatinine level slightly  elevated compared to baseline, encourage increased p.o. intake.  Restless leg syndrome -Continue Requip nightly  DVT prophylaxis: Lovenox Code Status: Full code Family Communication: Wife and daughter at bedside Disposition Plan: Patient is sick with sepsis and acute hypoxic resp failure from pneumonia and COPD exacerbation requiring BiPAP and IV antibiotics, expect more than 2 midnight hospital stay Consults called: None Admission status: PCU admit   Emeline General MD [facility] Pager [phone]  07/30/2023, 9:12 AM

## 2023-07-30 NOTE — Progress Notes (Signed)
PHARMACIST - PHYSICIAN COMMUNICATION  CONCERNING:  Enoxaparin (Lovenox) for DVT Prophylaxis    RECOMMENDATION: Patient was prescribed enoxaprin 40mg  q24 hours for VTE prophylaxis.   Filed Weights   07/30/23 3474  Weight: 117.9 kg (260 lb)    Body mass index is 38.4 kg/m.  Estimated Creatinine Clearance: 47.9 mL/min (A) (by C-G formula based on SCr of 1.61 mg/dL (H)).   Based on [facility] policy patient is candidate for enoxaparin 0.5mg /kg TBW SQ every 24 hours based on BMI being >30.  DESCRIPTION: Pharmacy has adjusted enoxaparin dose per [facility] policy.  Patient is now receiving enoxaparin 55 mg every 24 hours    Barrie Folk, PharmD Clinical Pharmacist  07/30/2023 9:21 AM

## 2023-07-30 NOTE — Evaluation (Signed)
Clinical/Bedside Swallow Evaluation Patient Details  Name: Brian Kramer MRN: [id number] Date of Birth: [birth year]  Today's Date: 07/30/2023 Time: SLP Start Time (ACUTE ONLY): [phone] SLP Stop Time (ACUTE ONLY): 1000 SLP Time Calculation (min) (ACUTE ONLY): 18 min  Past Medical History:  Past Medical History:  Diagnosis Date   Anemia    Anginal pain (HCC)    Anxiety    Arthritis    Bilateral renal cysts    CKD (chronic kidney disease), stage III (HCC)    COPD (chronic obstructive pulmonary disease) (HCC)    Coronary artery disease    a.) MI 03/1995 - no PCI; b.) LHC/PCI 1997 - stent (unknown type) x 1 to RCA; c.) LHC/PCI 2000 - stent (unknown type) x 1 to LCx; d.) LHC 04/02/2010 - insig CAD with widely patent stents; medical mgmt; e.) LHC 04/30/2015 - patent stents; insig CAD; medical mgmt; f.) R/LHC 05/01/20: EF 60%, norm cors.   Depression    Diastolic dysfunction 09/05/2021   a.)  TTE 09/05/2021: EF 50%, mild BAE, trivial PR, mild MR/TR; pHTN (RVSP 50 mmHg), G1DD.   Essential hypertension 04/08/2021   GERD (gastroesophageal reflux disease)    a.) improved s/p sleeve gastrectomy; no daily Tx   History of hiatal hernia 2004   a.) s/p repair   History of obstructive sleep apnea    a.) resolved s/p sleeve gastrectomy; no longer requires nocturnal PAP therapy   HLD (hyperlipidemia)    Incomplete right bundle branch block (RBBB)    Insomnia    Lewy body dementia (HCC)    Long term current use of aspirin    Loosening of knee joint prosthesis (HCC)    Migraines    Myocardial infarction (HCC) 03/1995   a.) full details unknown; LHC performed, however no PCI performed.   On supplemental oxygen by nasal cannula    a.) 2L/[place] PRN   PAD (peripheral artery disease) (HCC) 04/08/2021   Peripheral neuropathy    Pneumonia    Presbycusis    PUD (peptic ulcer disease)    Pulmonary hypertension (HCC)    a.) takes ambrisentan; b.)  R/LHC 05/01/2020: EF 60%, mean PA 30, mean PCWP 16,  RVEDP 19, Ao sat 95, PA sat 74, CO 8.2, PVR 137, SVR 736; c.)  TTE 09/05/2021: EF 50%, RVSP 50 mmHg.   RLS (restless legs syndrome)    a.) takes ropinirole   SOB (shortness of breath) 06/28/2022   Thrombocytopenia (HCC)    Tremor    Type 2 diabetes, diet controlled [facility])    Past Surgical History:  Past Surgical History:  Procedure Laterality Date   AMPUTATION TOE Right 12/20/2021   Procedure: AMPUTATION TOE - SECOND AND THIRD - 28825;  Surgeon: Linus Galas, DPM;  Location: [facility] ORS;  Service: Podiatry;  Laterality: Right;   CARPOMETACARPAL ([facility]) FUSION OF THUMB Right 04/08/2018   Procedure: CARPOMETACARPAL [facility]) FUSION OF THUMB;  Surgeon: Kennedy Bucker, MD;  Location: [facility];  Service: Orthopedics;  Laterality: Right;   CARPOMETACARPAL (CMC) FUSION OF THUMB Left 12/02/2018   Procedure: CARPOMETACARPAL (CMC) FUSION OF  LEFT THUMB, DIABETIC;  Surgeon: Kennedy Bucker, MD;  Location: [facility];  Service: Orthopedics;  Laterality: Left;   CATARACT EXTRACTION Bilateral 2015   COLONOSCOPY     COLONOSCOPY WITH PROPOFOL N/A 05/19/2018   Procedure: COLONOSCOPY WITH PROPOFOL;  Surgeon: Tol[place], Boykin Nearing, MD;  Location: [facility];  Service: Gastroenterology;  Laterality: N/A;   CORONARY ANGIOPLASTY WITH STENT PLACEMENT Left 1997   Procedure: CORONARY ANGIOPLASTY  WITH STENT PLACEMENT; stent x1 (unknown type) to RCA   CORONARY ANGIOPLASTY WITH STENT PLACEMENT Left 2000   Procedure: CORONARY ANGIOPLASTY WITH STENT PLACEMENT; stent x1 (unknown type) to LCx   EXCISION PARTIAL PHALANX Left 10/04/2021   Procedure: EXCISION PARTIAL PHALANX - 2, 3, 4;  Surgeon: Linus Galas, DPM;  Location: [facility];  Service: Podiatry;  Laterality: Left;   EYE SURGERY     cataract bilateral with lens   FLEXOR TENOTOMY  Right 12/20/2021   Procedure: FLEXOR TENOTOMY - FOURTH TOE - 16109;  Surgeon: Linus Galas, DPM;  Location: [facility];  Service: Podiatry;  Laterality: Right;   HAMMER TOE SURGERY Right 01/15/2018    Procedure: HAMMER TOE CORRECTION-2ND & 3RD;  Surgeon: Recardo Evangelist, DPM;  Location: [facility];  Service: Podiatry;  Laterality: Right;   HEEL SPUR RESECTION  2012   INGUINAL HERNIA REPAIR Bilateral    KNEE ARTHROSCOPY Right 2005   KNEE ARTHROSCOPY Left 2006   LAPAROSCOPIC GASTRIC SLEEVE RESECTION N/A 03/17/2016   LEFT HEART CATH AND CORONARY ANGIOGRAPHY Left 04/02/2010   Procedure: LEFT HEART CATH AND CORONARY ANGIOGRAPHY; Location: [facility]; Surgeon: Phil Dopp, MD   LEFT HEART CATH AND CORONARY ANGIOGRAPHY Left 04/30/2015   Procedure: LEFT HEART CATH AND CORONARY ANGIOGRAPHY; Location: [facility]; Surgeon: Aletha Halim, MD   RIGHT AND LEFT HEART CATH Left 05/01/2020   Procedure: RIGHT AND LEFT HEART CATH;  Surgeon: Alwyn Pea, MD;  Location: [facility];  Service: Cardiovascular;  Laterality: Left;   Right shoulder AC separation surgery in 1995 Right    Stem Cells to left heel  2017   TONSILLECTOMY     TOTAL KNEE ARTHROPLASTY Left 2007   TOTAL KNEE ARTHROPLASTY Right 2015   TOTAL KNEE REVISION Left 04/06/2023   Procedure: Left total knee revision, both components;  Surgeon: Reinaldo Berber, MD;  Location: [facility];  Service: Orthopedics;  Laterality: Left;   ULNAR TUNNEL RELEASE Left 04/14/2019   Procedure: LEFT CUBITAL TUNNEL RELEASE;  Surgeon: Kennedy Bucker, MD;  Location: [facility];  Service: Orthopedics;  Laterality: Left;   UPPER GI ENDOSCOPY  2017   VASECTOMY     1970s   WEIL OSTEOTOMY Right 01/15/2018   Procedure: WEIL OSTEOTOMY-2ND & 3RD;  Surgeon: Recardo Evangelist, DPM;  Location: [facility];  Service: Podiatry;  Laterality: Right;   HPI:  Brian Kramer is a 78 y.o. male with medical history significant of advanced dementia, CKD stage IIIa, COPD with chronic hypoxic respiratory failure on 2 L continuously, pulmonary hypertension on endothelin antagonist, restless leg syndrome, CAD status post stenting, brought in by family member for acute onset of respiratory  distress.  Temperature 101.6, tachycardia, blood pressure 129/41, O2 saturation 75% on room air and patient was started on BiPAP 14/8.   Chest X-ray - 07/30/2023 1. Low volume film with progressive vascular congestion and diffuse interstitial opacity. Imaging features suggest edema. 2. New right base airspace disease compatible with pneumonia and possible small right pleural effusion.    Assessment / Plan / Recommendation  Clinical Impression  Pt on O2 via [place], pt with history of dysphagia/aspiration (03/2023). Family at bedside providing information and demonstrate knowledge of dysphagia, choking and aspiration from previous admission. Pt usually wears upper and lower dentures but are not present at hospital. As such, pt with lengthy mastication of regular texture sausage. Would recommend downgrade to dysphagia 2 diet to compensate for edentulous status. Pt and his family were agreeable to this plan. Pt was free  of any overt s/s of aspiration when consuming thin liquids via cup. His family report that at home pt consumes his medicine whole in puree. Will initiate the above recommendations. Skilled ST services do not appear indicated for dysphagia. ST to sign off. SLP Visit Diagnosis: Dysphagia, oral phase (R13.11)    Aspiration Risk  Mild aspiration risk    Diet Recommendation Dysphagia 2 (Fine chop);Thin liquid    Liquid Administration via: Cup Medication Administration: Whole meds with puree Supervision: Staff to assist with self feeding;Intermittent supervision to cue for compensatory strategies;Full supervision/cueing for compensatory strategies Compensations: Minimize environmental distractions;Slow rate;Small sips/bites Postural Changes: Seated upright at 90 degrees;Remain upright for at least 30 minutes after po intake    Other  Recommendations Oral Care Recommendations: Oral care BID    Recommendations for follow up therapy are one component of a multi-disciplinary discharge planning  process, led by the attending physician.  Recommendations may be updated based on patient status, additional functional criteria and insurance authorization.  Follow up Recommendations No SLP follow up      Assistance Recommended at Discharge    Functional Status Assessment Patient has not had a recent decline in their functional status  Frequency and Duration   N/A         Prognosis   N/A     Swallow Study   General Date of Onset: 07/30/23 HPI: Brian Kramer is a 78 y.o. male with medical history significant of advanced dementia, CKD stage IIIa, COPD with chronic hypoxic respiratory failure on 2 L continuously, pulmonary hypertension on endothelin antagonist, restless leg syndrome, CAD status post stenting, brought in by family member for acute onset of respiratory distress.  Temperature 101.6, tachycardia, blood pressure 129/41, O2 saturation 75% on room air and patient was started on BiPAP 14/8.   Chest X-ray - 07/30/2023 1. Low volume film with progressive vascular congestion and diffuse interstitial opacity. Imaging features suggest edema. 2. New right base airspace disease compatible with pneumonia and possible small right pleural effusion. Type of Study: Bedside Swallow Evaluation Previous Swallow Assessment: 03/2023 Diet Prior to this Study: Regular;Thin liquids (Level 0) Temperature Spikes Noted: Yes Respiratory Status: Nasal cannula History of Recent Intubation: No Behavior/Cognition: Alert;Cooperative;Pleasant mood Oral Cavity Assessment: Within Functional Limits Oral Care Completed by SLP: No Oral Cavity - Dentition: Edentulous (pt has dentures but they are at home) Vision: Functional for self-feeding Self-Feeding Abilities: Needs assist Patient Positioning: Upright in bed Baseline Vocal Quality: Normal Volitional Cough: Strong Volitional Swallow: Able to elicit    Oral/Motor/Sensory Function Overall Oral Motor/Sensory Function: Within functional limits   Ice  Chips Ice chips: Not tested   Thin Liquid Thin Liquid: Within functional limits Presentation: Cup;Self Fed    Nectar Thick Nectar Thick Liquid: Not tested   Honey Thick Honey Thick Liquid: Not tested   Puree Puree: Within functional limits Presentation: Spoon   Solid     Solid: Impaired Oral Phase Impairments: Impaired mastication (d/t lack of dentition) Oral Phase Functional Implications: Impaired mastication;Prolonged oral transit     Harvin Konicek B. Dreama Saa, M.S., CCC-SLP, Tree surgeon Certified Brain Injury Specialist [facility]  [facility] Rehabilitation Services Office [phone] Ascom [phone] Fax [phone]

## 2023-07-30 NOTE — Progress Notes (Signed)
Monitoring by Pharmacy for Pulmonary Hypertension Treatment   Indication - Continuation of prior to admission medication   Patient is 78 y.o.  with history of PAH on chronic ambrisentan (LETAIRIS) PTA and will be continued while hospitalized.   Continuing this medication order as an inpatient requires that monitoring parameters per REMS requirements must be met.  Chronic therapy is under the supervision of Herbon Flemming who is enrolled in the REMS program (REMS Provider ID unkown) and is being notified of continuation of therapy. A staff message in EPIC has been sent notifying the certified prescriber.>>provider does not have Epic Males not required to register for REMS with Ambrisentan Per patient report has previously been educated on Hepatotoxicity.  On admission pregnancy risk has been assessed and Patient is male - no monitoring required Hepatic function has been evaluated. AST / ALT appropriate to continue medication at this time.     Latest Ref Rng & Units 07/30/2023    6:36 AM 04/07/2023    1:11 AM 03/23/2023    9:05 AM  Hepatic Function  Total Protein 6.5 - 8.1 g/dL 7.4  7.1  7.2   Albumin 3.5 - 5.0 g/dL 3.6  3.8  3.9   AST 15 - 41 U/L 27  24  21    ALT 0 - 44 U/L 16  13  15    Alk Phosphatase 38 - 126 U/L 70  58  59   Total Bilirubin 0.0 - 1.2 mg/dL 0.6  0.6  0.4   Bilirubin, Direct 0.0 - 0.2 mg/dL  <4.0      If any question arise or pregnancy is identified during hospitalization, contact for bosentan: [phone]; macitentan: [phone]; ambrisentan: [phone].  Thank for you allowing [place] to participate in the care of this patient.  Barrie Folk 07/30/2023, 12:08 PM Clinical Pharmacist  Guides for Male Patient:  ambrisentan (LETAIRIS), macitentan (OPSUMIT), bosentan (TRACLEER).

## 2023-07-31 ENCOUNTER — Encounter: Payer: Self-pay | Admitting: Internal Medicine

## 2023-07-31 DIAGNOSIS — R338 Other retention of urine: Secondary | ICD-10-CM

## 2023-07-31 DIAGNOSIS — N138 Other obstructive and reflux uropathy: Secondary | ICD-10-CM | POA: Insufficient documentation

## 2023-07-31 DIAGNOSIS — R590 Localized enlarged lymph nodes: Secondary | ICD-10-CM | POA: Insufficient documentation

## 2023-07-31 DIAGNOSIS — N401 Enlarged prostate with lower urinary tract symptoms: Secondary | ICD-10-CM | POA: Diagnosis not present

## 2023-07-31 DIAGNOSIS — N1831 Chronic kidney disease, stage 3a: Secondary | ICD-10-CM | POA: Insufficient documentation

## 2023-07-31 DIAGNOSIS — J441 Chronic obstructive pulmonary disease with (acute) exacerbation: Secondary | ICD-10-CM

## 2023-07-31 DIAGNOSIS — J9621 Acute and chronic respiratory failure with hypoxia: Secondary | ICD-10-CM | POA: Diagnosis not present

## 2023-07-31 DIAGNOSIS — R6521 Severe sepsis with septic shock: Secondary | ICD-10-CM | POA: Diagnosis not present

## 2023-07-31 LAB — URINALYSIS, W/ REFLEX TO CULTURE (INFECTION SUSPECTED)
Bacteria, UA: NONE SEEN
Bilirubin Urine: NEGATIVE
Glucose, UA: NEGATIVE mg/dL
Ketones, ur: NEGATIVE mg/dL
Leukocytes,Ua: NEGATIVE
Nitrite: NEGATIVE
Protein, ur: NEGATIVE mg/dL
Specific Gravity, Urine: 1.019 (ref 1.005–1.030)
Squamous Epithelial / HPF: 0 /[HPF] (ref 0–5)
pH: 5 (ref 5.0–8.0)

## 2023-07-31 LAB — CBC
HCT: 25.3 % — ABNORMAL LOW (ref 39.0–52.0)
Hemoglobin: 8.2 g/dL — ABNORMAL LOW (ref 13.0–17.0)
MCH: 30 pg (ref 26.0–34.0)
MCHC: 32.4 g/dL (ref 30.0–36.0)
MCV: 92.7 fL (ref 80.0–100.0)
Platelets: 129 10*3/uL — ABNORMAL LOW (ref 150–400)
RBC: 2.73 MIL/uL — ABNORMAL LOW (ref 4.22–5.81)
RDW: 14.6 % (ref 11.5–15.5)
WBC: 12.9 10*3/uL — ABNORMAL HIGH (ref 4.0–10.5)
nRBC: 0 % (ref 0.0–0.2)

## 2023-07-31 LAB — IRON AND TIBC
Iron: 13 ug/dL — ABNORMAL LOW (ref 45–182)
Saturation Ratios: 5 % — ABNORMAL LOW (ref 17.9–39.5)
TIBC: 279 ug/dL (ref 250–450)
UIBC: 266 ug/dL

## 2023-07-31 LAB — BASIC METABOLIC PANEL
Anion gap: 5 (ref 5–15)
BUN: 52 mg/dL — ABNORMAL HIGH (ref 8–23)
CO2: 24 mmol/L (ref 22–32)
Calcium: 8.2 mg/dL — ABNORMAL LOW (ref 8.9–10.3)
Chloride: 109 mmol/L (ref 98–111)
Creatinine, Ser: 1.73 mg/dL — ABNORMAL HIGH (ref 0.61–1.24)
GFR, Estimated: 40 mL/min — ABNORMAL LOW (ref >60)
Glucose, Bld: 176 mg/dL — ABNORMAL HIGH (ref 70–99)
Potassium: 5.3 mmol/L — ABNORMAL HIGH (ref 3.5–5.1)
Sodium: 138 mmol/L (ref 135–145)

## 2023-07-31 LAB — FERRITIN: Ferritin: 36 ng/mL (ref 24–336)

## 2023-07-31 LAB — STREP PNEUMONIAE URINARY ANTIGEN: Strep Pneumo Urinary Antigen: NEGATIVE

## 2023-07-31 MED ORDER — SODIUM CHLORIDE 0.9 % IV SOLN: 1.0000 g | Freq: Three times a day (TID) | INTRAVENOUS | Status: DC

## 2023-07-31 MED ORDER — ROPINIROLE HCL 1 MG PO TABS
0.5000 mg | ORAL_TABLET | Freq: Every day | ORAL | Status: DC
  Administered 2023-08-01: 0.5 mg via ORAL
  Filled 2023-07-31: qty 1
  Filled 2023-07-31: qty 2

## 2023-07-31 MED ORDER — ASPIRIN 81 MG PO TBEC
81.0000 mg | DELAYED_RELEASE_TABLET | Freq: Every day | ORAL | Status: DC
  Administered 2023-07-31: 81 mg via ORAL
  Filled 2023-07-31: qty 1

## 2023-07-31 MED ORDER — AZITHROMYCIN 500 MG PO TABS: 500.0000 mg | ORAL_TABLET | Freq: Every day | ORAL | Status: DC

## 2023-07-31 MED ORDER — CLOBETASOL PROPIONATE 0.05 % EX CREA
TOPICAL_CREAM | Freq: Two times a day (BID) | CUTANEOUS | Status: DC
  Filled 2023-07-31 (×2): qty 15

## 2023-07-31 MED ORDER — SODIUM CHLORIDE 0.9 % IV SOLN
2.0000 g | Freq: Two times a day (BID) | INTRAVENOUS | Status: DC
  Administered 2023-07-31 – 2023-08-01 (×2): 2 g via INTRAVENOUS
  Filled 2023-07-31 (×3): qty 12.5

## 2023-07-31 MED ORDER — DULOXETINE HCL 30 MG PO CPEP
60.0000 mg | ORAL_CAPSULE | Freq: Every day | ORAL | Status: DC
  Administered 2023-08-01: 60 mg via ORAL
  Filled 2023-07-31: qty 2

## 2023-07-31 MED ORDER — POLYSACCHARIDE IRON COMPLEX 150 MG PO CAPS
150.0000 mg | ORAL_CAPSULE | Freq: Every day | ORAL | Status: DC
  Administered 2023-07-31 – 2023-08-01 (×2): 150 mg via ORAL
  Filled 2023-07-31 (×2): qty 1

## 2023-07-31 MED ORDER — SODIUM ZIRCONIUM CYCLOSILICATE 10 G PO PACK
10.0000 g | PACK | Freq: Once | ORAL | Status: AC
  Administered 2023-07-31: 10 g via ORAL
  Filled 2023-07-31: qty 1

## 2023-07-31 MED ORDER — TAMSULOSIN HCL 0.4 MG PO CAPS
0.4000 mg | ORAL_CAPSULE | Freq: Every day | ORAL | Status: DC
  Administered 2023-08-01: 0.4 mg via ORAL
  Filled 2023-07-31: qty 1

## 2023-07-31 MED ORDER — DULOXETINE HCL 30 MG PO CPEP
30.0000 mg | ORAL_CAPSULE | Freq: Every day | ORAL | Status: DC
  Administered 2023-07-31: 30 mg via ORAL
  Filled 2023-07-31: qty 1

## 2023-07-31 NOTE — Plan of Care (Signed)

## 2023-07-31 NOTE — Progress Notes (Signed)
Progress Note   Patient: Brian Kramer ZOX:[id number] DOB: [birth year] DOA: 07/30/2023     1 DOS: the patient was seen and examined on 07/31/2023   Brief hospital course: Amanda Pote is a 78 y.o. male with medical history significant of advanced dementia, CKD stage IIIa, COPD with chronic hypoxic respiratory failure on 2 L continuously, pulmonary hypertension on endothelin antagonist, restless leg syndrome, CAD status post stenting, brought in by family member for acute onset of respiratory distress.   Temperature 101.6, tachycardia, blood pressure 129/41, O2 saturation 75% on room air and patient was started on BiPAP 14/8.  CT chest that showed right lower lobe dense consolidation.  Patient was started on antibiotics.   Principal Problem:   Pneumonia Active Problems:   Acute on chronic respiratory failure with hypoxia (HCC)   Hyperkalemia   COPD with acute exacerbation (HCC)   Essential hypertension   CAD (coronary artery disease)   CAP (community acquired pneumonia)   Mediastinal adenopathy   CKD stage 3a, GFR 45-59 ml/min (HCC)   Severe sepsis with septic shock (CODE) (HCC)   Urinary retention due to benign prostatic hyperplasia   Assessment and Plan: Severe sepsis with septic shock is secondary to pneumonia. Right lower lobe aspiration pneumonia. Patient had a significant fever, tachycardia, consistent with sepsis.  Patient also has acute respite failure and severe lactic acidosis of 4.6.  Consistent with septic shock. Patient had received IV fluids, condition had improved. I personally reviewed patient chest CT scan results, patient has infiltrates and a dense consolidation in the right lower lobe, consistent with aspiration pneumonia. Blood culture sent out, pending results I will place patient on cefepime, patient has a penicillin allergy. Patient has been seen by speech therapy, placed on dysphagia 2 diet.  Acute on chronic hypoxemic respiratory failure. COPD  exacerbation. Patient was chronically on 2 L oxygen, required BiPAP at admission, still on 5 L oxygen.  This is secondary to aspiration pneumonia and COPD exacerbation. Patient currently no longer has any bronchospasm. Continuing oxygen.  Chronic kidney disease stage IIIa. Hyperkalemia. Renal function still stable, give Lokelma for hyperkalemia.  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 Thrombocytopenia secondary to sepsis. Monitor CBC, transfuse as needed.  Check iron B12 level.  Urinary retention secondary to benign prostate hypertrophy. Patient could not urinate, had a residual over 950 mL last night.  Had I&O cath performed.  Due to severe urinary retention, Foley catheter will be anchored.  I will place Flomax.  Likely keep Foley catheter for 3 to 4 days before removing it.  If discharged early, patient can be followed with urology as outpatient.  Morbid obesity.  BMI 38.4 with comorbidities. Diet and exercise.  Dementia. Continue home medicines.     Subjective:  Patient is still on 4 L oxygen, short of breath with duration.  Physical Exam: Vitals:   07/31/23 1017 07/31/23 1100 07/31/23 1101 07/31/23 1200  BP: (!) 100/54 (!) 97/46 (!) 97/46 (!) 109/53  Pulse: (!) 58 (!) 58 (!) 58 (!) 56  Resp: 17 18 16 18   Temp: 97.6 F (36.4 C)     TempSrc: Oral     SpO2: 100% 98% 98% 99%  Weight:      Height:       General exam: Appears calm and comfortable  Respiratory system: Bilaterally, more on the right lower field. Respiratory effort normal. Cardiovascular system: S1 & S2 heard, RRR. No JVD, murmurs, rubs, gallops or clicks. No pedal edema. Gastrointestinal system: Abdomen is  nondistended, soft and nontender. No organomegaly or masses felt. Normal bowel sounds heard. Central nervous system: Alert and oriented x2. No focal neurological deficits. Extremities: Symmetric 5 x 5 power. Skin: No rashes, lesions or ulcers Psychiatry: Judgement and insight appear normal.  Mood & affect appropriate.    Data Reviewed:  Review the CT scan images, results, lab results.  Family Communication: Wife updated at bedside.  Disposition: Status is: Inpatient Remains inpatient appropriate because: Severity of disease, IV treatment.     Time spent: 55 minutes  Author: Marrion Coy, MD 07/31/2023 1:01 PM  For on call review [email or web address].

## 2023-07-31 NOTE — Progress Notes (Signed)
PHARMACIST - PHYSICIAN COMMUNICATION  CONCERNING: Antibiotic IV to Oral Route Change Policy  RECOMMENDATION: This patient is receiving azithromycin by the intravenous route.  Based on criteria approved by the Pharmacy and Therapeutics Committee, the antibiotic(s) is/are being converted to the equivalent oral dose form(s).  DESCRIPTION: These criteria include: Patient being treated for a respiratory tract infection, urinary tract infection, cellulitis or clostridium difficile associated diarrhea if on metronidazole The patient is not neutropenic and does not exhibit a GI malabsorption state The patient is eating (either orally or via tube) and/or has been taking other orally administered medications for a least 24 hours The patient is improving clinically and has a Tmax < 100.5  If you have questions about this conversion, please contact the Pharmacy Department   Brian Kramer 07/31/23

## 2023-07-31 NOTE — Progress Notes (Signed)
PHARMACY NOTE:  ANTIMICROBIAL RENAL DOSAGE ADJUSTMENT  Current antimicrobial regimen includes a mismatch between antimicrobial dosage and estimated renal function.  As per policy approved by the Pharmacy & Therapeutics and Medical Executive Committees, the antimicrobial dosage will be adjusted accordingly.  Current antimicrobial dosage:  cefepime 1g IV every 8 hours  Indication: Pneumonia   Renal Function:  Estimated Creatinine Clearance: 44.6 mL/min (A) (by C-G formula based on SCr of 1.73 mg/dL (H)).  Antimicrobial dosage has been changed to:  Cefepime 2g IV every 12 hours  Additional comments:   Thank you for allowing pharmacy to be a part of this patient's care.  Gardner Candle, PharmD, BCPS Clinical Pharmacist 07/31/2023 12:56 PM

## 2023-07-31 NOTE — Progress Notes (Signed)
       CROSS COVER NOTE  NAME: Brian Kramer MRN: [id number] DOB : [birth year] ATTENDING PHYSICIAN: Emeline General, MD    Date of Service   07/31/2023   HPI/Events of Note   Notified of no urine output throughout night.  Bladder scan 420 ml  Interventions   Assessment/Plan: In an d out cath        Donnie Mesa NP [facility] Cross Cover 7pm-7am - check amion for availability Pager [phone]

## 2023-07-31 NOTE — ED Notes (Signed)
Informed rn bed assigned 

## 2023-07-31 NOTE — ED Notes (Signed)
Pt bladder scanned. Volume 312 mL

## 2023-07-31 NOTE — Hospital Course (Addendum)
 Brian Kramer is a 78 y.o. male with medical history significant of advanced dementia, CKD stage IIIa, COPD with chronic hypoxic respiratory failure on 2 L continuously, pulmonary hypertension on endothelin antagonist, restless leg syndrome, CAD status post stenting, brought in by family member for acute onset of respiratory distress.   Temperature 101.6, tachycardia, blood pressure 129/41, O2 saturation 75% on room air and patient was started on BiPAP 14/8.  CT chest that showed right lower lobe dense consolidation.  Patient was started on antibiotics. Patient was seen by speech therapy, placed on dysphagia 2 diet.  Patient condition finally improved, back on 2 L oxygen.  Medically stable for discharge.

## 2023-08-01 DIAGNOSIS — R6521 Severe sepsis with septic shock: Secondary | ICD-10-CM | POA: Diagnosis not present

## 2023-08-01 DIAGNOSIS — J690 Pneumonitis due to inhalation of food and vomit: Secondary | ICD-10-CM

## 2023-08-01 DIAGNOSIS — J441 Chronic obstructive pulmonary disease with (acute) exacerbation: Secondary | ICD-10-CM | POA: Diagnosis not present

## 2023-08-01 DIAGNOSIS — J9621 Acute and chronic respiratory failure with hypoxia: Secondary | ICD-10-CM | POA: Diagnosis not present

## 2023-08-01 LAB — CBC
HCT: 25.2 % — ABNORMAL LOW (ref 39.0–52.0)
Hemoglobin: 8.1 g/dL — ABNORMAL LOW (ref 13.0–17.0)
MCH: 30 pg (ref 26.0–34.0)
MCHC: 32.1 g/dL (ref 30.0–36.0)
MCV: 93.3 fL (ref 80.0–100.0)
Platelets: 115 10*3/uL — ABNORMAL LOW (ref 150–400)
RBC: 2.7 MIL/uL — ABNORMAL LOW (ref 4.22–5.81)
RDW: 14.6 % (ref 11.5–15.5)
WBC: 8 10*3/uL (ref 4.0–10.5)
nRBC: 0 % (ref 0.0–0.2)

## 2023-08-01 LAB — BASIC METABOLIC PANEL
Anion gap: 5 (ref 5–15)
BUN: 47 mg/dL — ABNORMAL HIGH (ref 8–23)
CO2: 25 mmol/L (ref 22–32)
Calcium: 8.4 mg/dL — ABNORMAL LOW (ref 8.9–10.3)
Chloride: 109 mmol/L (ref 98–111)
Creatinine, Ser: 1.41 mg/dL — ABNORMAL HIGH (ref 0.61–1.24)
GFR, Estimated: 51 mL/min — ABNORMAL LOW (ref >60)
Glucose, Bld: 138 mg/dL — ABNORMAL HIGH (ref 70–99)
Potassium: 5.1 mmol/L (ref 3.5–5.1)
Sodium: 139 mmol/L (ref 135–145)

## 2023-08-01 LAB — MAGNESIUM: Magnesium: 2.4 mg/dL (ref 1.7–2.4)

## 2023-08-01 LAB — VITAMIN B12: Vitamin B-12: 665 pg/mL (ref 180–914)

## 2023-08-01 MED ORDER — PREDNISONE 20 MG PO TABS: ORAL_TABLET | ORAL | 0 refills | Status: AC

## 2023-08-01 MED ORDER — IPRATROPIUM-ALBUTEROL 0.5-2.5 (3) MG/3ML IN SOLN
3.0000 mL | Freq: Three times a day (TID) | RESPIRATORY_TRACT | Status: DC
  Administered 2023-08-01: 3 mL via RESPIRATORY_TRACT
  Filled 2023-08-01: qty 3

## 2023-08-01 MED ORDER — CHLORHEXIDINE GLUCONATE CLOTH 2 % EX PADS
6.0000 | MEDICATED_PAD | Freq: Every day | CUTANEOUS | Status: DC
  Administered 2023-08-01: 6 via TOPICAL

## 2023-08-01 MED ORDER — IRON SUCROSE 300 MG IVPB - SIMPLE MED
300.0000 mg | Freq: Once | Status: AC
  Administered 2023-08-01: 300 mg via INTRAVENOUS
  Filled 2023-08-01: qty 300

## 2023-08-01 MED ORDER — LEVOFLOXACIN 750 MG PO TABS: 750.0000 mg | ORAL_TABLET | Freq: Every day | ORAL | 0 refills | Status: AC

## 2023-08-01 MED ORDER — POLYETHYLENE GLYCOL 3350 17 G PO PACK
17.0000 g | PACK | Freq: Once | ORAL | Status: AC
  Administered 2023-08-01: 17 g via ORAL
  Filled 2023-08-01: qty 1

## 2023-08-01 MED ORDER — TAMSULOSIN HCL 0.4 MG PO CAPS: 0.4000 mg | ORAL_CAPSULE | Freq: Every day | ORAL | 0 refills | Status: DC

## 2023-08-01 NOTE — Discharge Summary (Signed)
 Physician Discharge Summary   Patient: Brian Kramer MRN: [id number] DOB: 12/27/1945  Admit date:     07/30/2023  Discharge date: 08/01/23  Discharge Physician: Marrion Coy   PCP: Marisue Ivan, MD   Recommendations at discharge:   Follow-up with PCP in 1 week. Follow-up with pulmonology in 5 weeks for midsternal lymphadenopathy. Follow-up with urology for urinary retention. Check a BMP at her next office visit.  Discharge Diagnoses: Principal Problem:   Aspiration pneumonia of right lower lobe (HCC) Active Problems:   Acute on chronic respiratory failure with hypoxia (HCC)   Hyperkalemia   COPD with acute exacerbation (HCC)   Essential hypertension   CAD (coronary artery disease)   Mediastinal adenopathy   CKD stage 3a, GFR 45-59 ml/min (HCC)   Severe sepsis with septic shock (CODE) (HCC)   Urinary retention due to benign prostatic hyperplasia  Resolved Problems:   * No resolved hospital problems. *  Hospital Course: Brian Kramer is a 78 y.o. male with medical history significant of advanced dementia, CKD stage IIIa, COPD with chronic hypoxic respiratory failure on 2 L continuously, pulmonary hypertension on endothelin antagonist, restless leg syndrome, CAD status post stenting, brought in by family member for acute onset of respiratory distress.   Temperature 101.6, tachycardia, blood pressure 129/41, O2 saturation 75% on room air and patient was started on BiPAP 14/8.  CT chest that showed right lower lobe dense consolidation.  Patient was started on antibiotics. Patient was seen by speech therapy, placed on dysphagia 2 diet.  Patient condition finally improved, back on 2 L oxygen.  Medically stable for discharge.  Assessment and Plan: Severe sepsis with septic shock is secondary to pneumonia. Right lower lobe aspiration pneumonia. Patient had a significant fever, tachycardia, consistent with sepsis.  Patient also has acute respite failure and severe  lactic acidosis of 4.6.  Consistent with septic shock. Patient had received IV fluids, condition had improved. I personally reviewed patient chest CT scan results, patient has infiltrates and a dense consolidation in the right lower lobe, consistent with aspiration pneumonia. Blood culture sent out, no growth in 2 days. I placed patient on cefepime, patient has a penicillin allergy. Patient has been seen by speech therapy, placed on dysphagia 2 diet. Condition has improved, will continue Levaquin for additional 3 days.    Acute on chronic hypoxemic respiratory failure. COPD exacerbation. Patient was chronically on 2 L oxygen, required BiPAP at admission, then on 5 L oxygen.  This is secondary to aspiration pneumonia and COPD exacerbation. Patient currently no longer has any bronchospasm. Condition improved, oxygen has weaned down to 2 L.  Will continue steroid taper at this time.   Chronic kidney disease stage IIIa. Hyperkalemia. Renal function still stable, potassium normalized after giving Lokelma yesterday.    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 Iron deficient anemia. Thrombocytopenia secondary to sepsis. Patient was chronically taking iron, repeat labs still showed severe iron deficiency.  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   Urinary retention secondary to benign prostate hypertrophy. Patient could not urinate, had a residual over 950 mL last night.  Had I&O cath performed.  Due to severe urinary retention, Foley catheter will be anchored.  I will place Flomax.  Patient be followed with urology in 1 week.   Morbid obesity.  BMI 38.4 with comorbidities. Diet and exercise.   Dementia. Continue home medicines.       Consultants: None Procedures performed:  None  Disposition: Home health Diet recommendation:  Discharge Diet Orders (From admission, onward)     Start     Ordered   08/01/23 0000  Diet  general       Comments: Dys 2 diet   08/01/23 1053           Cardiac diet DISCHARGE MEDICATION: Allergies as of 08/01/2023       Reactions   Tape Other (See Comments)   Round, white EKG pads only - "ate holes in my skin" 1997 when stays on skin for longer period Any tape OK if it doesn't stay on too long   Penicillins Itching, Rash, Other (See Comments)   Has patient had a PCN reaction causing immediate rash, facial/tongue/throat swelling, SOB or lightheadedness with hypotension: Yes Has patient had a PCN reaction causing severe rash involving mucus membranes or skin necrosis: No Has patient had a PCN reaction that required hospitalization: No Has patient had a PCN reaction occurring within the last 10 years: No If all of the above answers are "NO", then may proceed with Cephalosporin use.   Shellfish Allergy Rash, Other (See Comments)   PT CAN NOT EAT CRAB or lobster, BUT CAN EAT shrimp. No problems with betadine or iodine        Medication List     TAKE these medications    acetaminophen 500 MG tablet Commonly known as: TYLENOL Take 2 tablets (1,000 mg total) by mouth every 8 (eight) hours.   albuterol 108 (90 Base) MCG/ACT inhaler Commonly known as: VENTOLIN HFA Inhale 2 puffs into the lungs every 6 (six) hours as needed for wheezing or shortness of breath.   ambrisentan 5 MG tablet Commonly known as: LETAIRIS Take 5 mg by mouth daily.   aspirin EC 81 MG tablet Take 81 mg by mouth at bedtime.   azelastine 0.1 % nasal spray Commonly known as: ASTELIN Place 1 spray into both nostrils 2 (two) times daily as needed for rhinitis or allergies. Use in each nostril as directed   BARIATRIC MULTIVITAMINS/IRON PO Take 1 tablet by mouth daily.   Calcium 500-100 MG-UNIT Chew Chew 500 mg by mouth 2 (two) times daily. Calcium is bariatric chewable with vitamin D included   clobetasol ointment 0.05 % Commonly known as: TEMOVATE Apply 1 Application topically 2 (two)  times daily.   DULoxetine 60 MG capsule Commonly known as: CYMBALTA Take 60 mg by mouth in the morning.   DULoxetine HCl 30 MG Csdr Take 30 mg by mouth at bedtime.   ferrous sulfate 325 (65 FE) MG tablet Take 325 mg by mouth daily with breakfast.   ipratropium 0.03 % nasal spray Commonly known as: ATROVENT Place 2 sprays into both nostrils 3 (three) times daily as needed for rhinitis.   levofloxacin 750 MG tablet Commonly known as: Levaquin Take 1 tablet (750 mg total) by mouth daily for 3 days.   Lyrica 200 MG capsule Generic drug: pregabalin Take 200 mg by mouth 3 (three) times daily.   mirtazapine 15 MG tablet Commonly known as: REMERON Take 15 mg by mouth at bedtime.   mometasone 0.1 % lotion Commonly known as: ELOCON 1-4 drops See admin instructions. 1-4 drops in each ear as needed for itching   nitroGLYCERIN 0.4 MG SL tablet Commonly known as: NITROSTAT Place 0.4 mg under the tongue every 5 (five) minutes as needed for chest pain.   oxybutynin 5 MG 24 hr tablet Commonly known as: DITROPAN-XL Take 5 mg by mouth daily.  OXYGEN Inhale 2 L into the lungs at bedtime.   predniSONE 20 MG tablet Commonly known as: DELTASONE Take 2 tablets (40 mg total) by mouth daily with breakfast for 3 days, THEN 1 tablet (20 mg total) daily with breakfast for 3 days, THEN 0.5 tablets (10 mg total) daily with breakfast for 3 days. Start taking on: August 01, 2023   QUEtiapine 25 MG tablet Commonly known as: SEROQUEL Take 25 mg by mouth at bedtime.   rOPINIRole 4 MG 24 hr tablet Commonly known as: REQUIP XL Take 4 mg by mouth at bedtime.   rosuvastatin 5 MG tablet Commonly known as: CRESTOR Take 5 mg by mouth every evening.   tamsulosin 0.4 MG Caps capsule Commonly known as: FLOMAX Take 1 capsule (0.4 mg total) by mouth daily. Start taking on: August 02, 2023   Trelegy Ellipta 100-62.5-25 MCG/ACT Aepb Generic drug: Fluticasone-Umeclidin-Vilant Inhale 1 puff into  the lungs daily.        Follow-up Information     Marisue Ivan, MD Follow up in 1 week(s).   Specialty: Family Medicine Contact information: [address] [facility] [place] [address] [phone]         Riki Altes, MD Follow up in 1 week(s).   Specialty: Urology Contact information: [address] [place] [address] [phone]         Mertie Moores, MD Follow up in 5 week(s).   Specialty: Specialist Contact information: [address] [place] [address] [phone]                Discharge Exam: Filed Weights   07/30/23 1027  Weight: 117.9 kg   General exam: Appears calm and comfortable  Respiratory system: Decreased breath sounds. Respiratory effort normal. Cardiovascular system: S1 & S2 heard, RRR. No JVD, murmurs, rubs, gallops or clicks. No pedal edema. Gastrointestinal system: Abdomen is nondistended, soft and nontender. No organomegaly or masses felt. Normal bowel sounds heard. Central nervous system: Alert and oriented. No focal neurological deficits. Extremities: Symmetric 5 x 5 power. Skin: No rashes, lesions or ulcers Psychiatry: Judgement and insight appear normal. Mood & affect appropriate.    Condition at discharge: good  The results of significant diagnostics from this hospitalization (including imaging, microbiology, ancillary and laboratory) are listed below for reference.   Imaging Studies: CT CHEST WO CONTRAST Result Date: 07/30/2023 CLINICAL DATA:  Pneumonia EXAM: CT CHEST WITHOUT CONTRAST TECHNIQUE: Multidetector CT imaging of the chest was performed following the standard protocol without IV contrast. RADIATION DOSE REDUCTION: This exam was performed according to the departmental dose-optimization program which includes automated exposure control, adjustment of the mA and/or kV according to patient size and/or use of iterative reconstruction technique.  COMPARISON:  Chest radiograph 07/29/1998 FINDINGS: Cardiovascular: Coronary artery calcification and aortic atherosclerotic calcification. Mediastinum/Nodes: Enlarged mediastinal and hilar lymph nodes. Example RIGHT lower paratracheal node measures 21 mm (image 60). RIGHT hilar lymph node measures 26 mm on image 72. Subcarinal node measures 18 mm. No supraclavicular adenopathy. Lungs/Pleura: Dense consolidation in the RIGHT lower lobe air bronchograms. Nodular airspace densities in the RIGHT middle lobe. Mild atelectasis in the LEFT lower lobe. Upper Abdomen: Limited view of the liver, kidneys, pancreas are unremarkable. Normal adrenal glands. Musculoskeletal: No acute osseous abnormality. IMPRESSION: 1. Findings most consistent with multifocal pneumonia the RIGHT lung. Dense consolidation in the RIGHT lower lobe. 2. Bulky mediastinal adenopathy. Differential includes reactive adenopathy related to pneumonia versus adenopathy related to lymphoma or  metastatic adenopathy. The lymph nodes are larger than typical reactive adenopathy. Recommend clinical correlation and consider FDG PET scan. Electronically Signed   By: Genevive Bi M.D.   On: 07/30/2023 09:48   DG Chest Port 1 View Result Date: 07/30/2023 CLINICAL DATA:  Chest pressure on the left side.  Chest pain. EXAM: PORTABLE CHEST 1 VIEW COMPARISON:  04/07/2023 FINDINGS: Low volume film. The cardio pericardial silhouette is enlarged. Vascular congestion with diffuse interstitial opacity is progressive in the interval. There is new right base airspace disease with possible small right pleural effusion. No acute bony abnormality. Telemetry leads overlie the chest. IMPRESSION: 1. Low volume film with progressive vascular congestion and diffuse interstitial opacity. Imaging features suggest edema. 2. New right base airspace disease compatible with pneumonia and possible small right pleural effusion. Electronically Signed   By: Kennith Center M.D.   On:  07/30/2023 07:16    Microbiology: Results for orders placed or performed during the hospital encounter of 07/30/23  Resp panel by RT-PCR (RSV, Flu A&B, Covid) Anterior Nasal Swab     Status: None   Collection Time: 07/30/23  6:36 AM   Specimen: Anterior Nasal Swab  Result Value Ref Range Status   SARS Coronavirus 2 by RT PCR NEGATIVE NEGATIVE Final    Comment: (NOTE) SARS-CoV-2 target nucleic acids are NOT DETECTED.  The SARS-CoV-2 RNA is generally detectable in upper respiratory specimens during the acute phase of infection. The lowest concentration of SARS-CoV-2 viral copies this assay can detect is 138 copies/mL. A negative result does not preclude SARS-Cov-2 infection and should not be used as the sole basis for treatment or other patient management decisions. A negative result may occur with  improper specimen collection/handling, submission of specimen other than nasopharyngeal swab, presence of viral mutation(s) within the areas targeted by this assay, and inadequate number of viral copies(<138 copies/mL). A negative result must be combined with clinical observations, patient history, and epidemiological information. The expected result is Negative.  Fact Sheet for Patients:  BloggerCourse.com  Fact Sheet for Healthcare Providers:  SeriousBroker.it  This test is no t yet approved or cleared by the [place] FDA and  has been authorized for detection and/or diagnosis of SARS-CoV-2 by FDA under an Emergency Use Authorization (EUA). This EUA will remain  in effect (meaning this test can be used) for the duration of the COVID-19 declaration under Section 564(b)(1) of the Act, 21 U.S.C.section 360bbb-3(b)(1), unless the authorization is terminated  or revoked sooner.       Influenza A by PCR NEGATIVE NEGATIVE Final   Influenza B by PCR NEGATIVE NEGATIVE Final    Comment: (NOTE) The Xpert Xpress SARS-CoV-2/FLU/RSV plus  assay is intended as an aid in the diagnosis of influenza from Nasopharyngeal swab specimens and should not be used as a sole basis for treatment. Nasal washings and aspirates are unacceptable for Xpert Xpress SARS-CoV-2/FLU/RSV testing.  Fact Sheet for Patients: BloggerCourse.com  Fact Sheet for Healthcare Providers: SeriousBroker.it  This test is not yet approved or cleared by the [place] FDA and has been authorized for detection and/or diagnosis of SARS-CoV-2 by FDA under an Emergency Use Authorization (EUA). This EUA will remain in effect (meaning this test can be used) for the duration of the COVID-19 declaration under Section 564(b)(1) of the Act, 21 U.S.C. section 360bbb-3(b)(1), unless the authorization is terminated or revoked.     Resp Syncytial Virus by PCR NEGATIVE NEGATIVE Final    Comment: (NOTE) Fact Sheet for Patients: BloggerCourse.com  Fact Sheet for Healthcare Providers: SeriousBroker.it  This test is not yet approved or cleared by the [place] FDA and has been authorized for detection and/or diagnosis of SARS-CoV-2 by FDA under an Emergency Use Authorization (EUA). This EUA will remain in effect (meaning this test can be used) for the duration of the COVID-19 declaration under Section 564(b)(1) of the Act, 21 U.S.C. section 360bbb-3(b)(1), unless the authorization is terminated or revoked.  Performed at [facility], [address] [place], [place] [address]   Blood Culture (routine x 2)     Status: None (Preliminary result)   Collection Time: 07/30/23  6:36 AM   Specimen: BLOOD  Result Value Ref Range Status   Specimen Description BLOOD BLOOD RIGHT ARM  Final   Special Requests   Final    BOTTLES DRAWN AEROBIC AND ANAEROBIC Blood Culture adequate volume   Culture   Final    NO GROWTH 2 DAYS Performed at [facility],  [address] [place], [place] [address]    Report Status PENDING  Incomplete  Blood Culture (routine x 2)     Status: None (Preliminary result)   Collection Time: 07/30/23  6:37 AM   Specimen: BLOOD  Result Value Ref Range Status   Specimen Description BLOOD BLOOD LEFT ARM  Final   Special Requests   Final    BOTTLES DRAWN AEROBIC AND ANAEROBIC Blood Culture adequate volume   Culture   Final    NO GROWTH 2 DAYS Performed at [facility], [address] [place], [place] [address]    Report Status PENDING  Incomplete    Labs: CBC: Recent Labs  Lab 07/30/23 0636 07/31/23 0235 08/01/23 0450  WBC 9.1 12.9* 8.0  NEUTROABS 7.2  --   --   HGB 10.7* 8.2* 8.1*  HCT 33.5* 25.3* 25.2*  MCV 94.4 92.7 93.3  PLT 154 129* 115*   Basic Metabolic Panel: Recent Labs  Lab 07/30/23 0636 07/31/23 0235 08/01/23 0450  NA 141 138 139  K 4.9 5.3* 5.1  CL 108 109 109  CO2 23 24 25   GLUCOSE 162* 176* 138*  BUN 43* 52* 47*  CREATININE 1.61* 1.73* 1.41*  CALCIUM 8.9 8.2* 8.4*  MG  --   --  2.4   Liver Function Tests: Recent Labs  Lab 07/30/23 0636  AST 27  ALT 16  ALKPHOS 70  BILITOT 0.6  PROT 7.4  ALBUMIN 3.6   CBG: No results for input(s): "GLUCAP" in the last 168 hours.  Discharge time spent: greater than 30 minutes.  Signed: Marrion Coy, MD [facility] 08/01/2023

## 2023-08-01 NOTE — TOC Transition Note (Signed)
 Transition of Care [facility]) - Discharge Note   Patient Details  Name: Brian Kramer MRN: [id number] Date of Birth: January 30, 1946  [facility] CM/SW Contact:  Rodney Langton, RN Phone Number: 08/01/2023, 1:13 PM   Clinical Narrative:     Patient admitted from home with wife.  Dr. [name] is PCP, obtains medications from [facility] in [place].  Wife report he has DME in the home (lift recliner, electric wheelchair, cane, rollator, grab bars, ramp).  Aware of discharge orders and recommendations for home health PT/OT, agrees and does not have preference of agency.  Referral accepted by Bjorn Loser with [facility], wife to provide transportation home.   Final next level of care: Home w Home Health Services Barriers to Discharge: Barriers Resolved   Patient Goals and CMS Choice Patient states their goals for this hospitalization and ongoing recovery are:: Home          Discharge Placement                       Discharge Plan and Services Additional resources added to the After Visit Summary for                            [facility] Arranged: PT, OT HH Agency: [facility] Date [facility] Agency Contacted: 08/01/23 Time HH Agency Contacted: 1313 Representative spoke with at [facility] Agency: Bjorn Loser  Social Drivers of Health (SDOH) Interventions SDOH Screenings   Food Insecurity: No Food Insecurity (07/31/2023)  Housing: Low Risk  (07/31/2023)  Transportation Needs: No Transportation Needs (07/31/2023)  Utilities: Not At Risk (07/31/2023)  Financial Resource Strain: Low Risk  (07/15/2023)   Received from [facility]  Social Connections: Socially Isolated (07/31/2023)  Tobacco Use: Medium Risk (07/31/2023)     Readmission Risk Interventions     No data to display

## 2023-08-01 NOTE — Progress Notes (Signed)
 Physical Therapy Treatment Patient Details Name: Brian Kramer MRN: [id number] DOB: [birth year] Today's Date: 08/01/2023   History of Present Illness Pt is admitted for pneumonia with complaints of SOB symptoms. History includes dementia, CKD, COPD, CRF on 2L of O2 chronic, and CAD    PT Comments  Pt is progressing with mobility performing bed mobility at supervision level, transfers and gait with RW (45'x2) at CGA. During gait, pt's trunk leans towards left, slight unsteadiness with turns, cues to "keep inside walker", pt able to follow through. Initially, pt on 2 L O2 SPO2 89%, HR 71 bpm, after ambulation SPO2 83% increased to 3L O2 SPO2 quickly rose to 92% HR 75pbm. During 2nd ambulation and then seated rest SPO2 on 3L 88-95%. Continued PT will assist pt towards greater standing balance, and activity tolerance to increase safety  and functional independence with mobility.   If plan is discharge home, recommend the following: A lot of help with bathing/dressing/bathroom;Assist for transportation;Help with stairs or ramp for entrance;A little help with walking and/or transfers   Can travel by private vehicle        Equipment Recommendations  None recommended by PT    Recommendations for Other Services       Precautions / Restrictions Precautions Precautions: Fall Restrictions Weight Bearing Restrictions Per Provider Order: No     Mobility  Bed Mobility Overal bed mobility: Needs Assistance Bed Mobility: Supine to Sit     Supine to sit: Supervision     General bed mobility comments: no dizziness    Transfers Overall transfer level: Needs assistance Equipment used: Rolling walker (2 wheels) Transfers: Sit to/from Stand, Bed to chair/wheelchair/BSC Sit to Stand: Contact guard assist   Step pivot transfers: Contact guard assist       General transfer comment: no dizziness    Ambulation/Gait Ambulation/Gait assistance: Contact guard assist Gait Distance (Feet):  45 Feet (x2) Assistive device: Rolling walker (2 wheels) Gait Pattern/deviations: Step-through pattern, Drifts right/left, Decreased stride length Gait velocity: decreased.     General Gait Details: trunk leans towards left, slight unsteadiness with turns, cues to "keep inside walker", pt able to follow through. Initially, pt on 2 L O2 SPO2 89%, HR 71 bpm, after ambulation SPO2 83% increased to 3L O2 SPO2 quickly rose to 92% HR 75pbm.  During 2nd ambulation and then seated rest SPO2 on 3L 88-95%.   Stairs             Wheelchair Mobility     Tilt Bed    Modified Rankin (Stroke Patients Only)       Balance                                            Communication Communication Communication: No apparent difficulties  Cognition Arousal: Alert Behavior During Therapy: WFL for tasks assessed/performed   PT - Cognitive impairments: No apparent impairments                       PT - Cognition Comments: very pleasant and agreeable to session Following commands: Intact      Cueing Cueing Techniques: Verbal cues  Exercises      General Comments        Pertinent Vitals/Pain Pain Assessment Pain Assessment: No/denies pain    Home Living  Prior Function            PT Goals (current goals can now be found in the care plan section) Acute Rehab PT Goals Patient Stated Goal: to go home PT Goal Formulation: With patient Time For Goal Achievement: 08/13/23 Potential to Achieve Goals: Good Progress towards PT goals: Progressing toward goals    Frequency    Min 1X/week      PT Plan      Co-evaluation              AM-PAC PT "6 Clicks" Mobility   Outcome Measure  Help needed turning from your back to your side while in a flat bed without using bedrails?: A Little Help needed moving from lying on your back to sitting on the side of a flat bed without using bedrails?: A Little Help needed  moving to and from a bed to a chair (including a wheelchair)?: A Little Help needed standing up from a chair using your arms (e.g., wheelchair or bedside chair)?: A Little Help needed to walk in hospital room?: A Little Help needed climbing 3-5 steps with a railing? : A Little 6 Click Score: 18    End of Session Equipment Utilized During Treatment: Oxygen Activity Tolerance: Patient tolerated treatment well Patient left: in chair;with chair alarm set;with call bell/phone within reach;with family/visitor present Nurse Communication: Mobility status PT Visit Diagnosis: Unsteadiness on feet (R26.81);Muscle weakness (generalized) (M62.81);History of falling (Z91.81);Difficulty in walking, not elsewhere classified (R26.2);Dizziness and giddiness (R42)     Time: 4742-5956 PT Time Calculation (min) (ACUTE ONLY): 30 min  Charges:    $Gait Training: 8-22 mins $Therapeutic Activity: 8-22 mins PT General Charges $$ ACUTE PT VISIT: 1 Visit                     Hortencia Conradi, PTA  08/01/23, 11:43 AM

## 2023-08-03 ENCOUNTER — Ambulatory Visit: Payer: Medicare PPO

## 2023-08-04 LAB — CULTURE, BLOOD (ROUTINE X 2)
Culture: NO GROWTH
Culture: NO GROWTH
Special Requests: ADEQUATE
Special Requests: ADEQUATE

## 2023-08-05 ENCOUNTER — Ambulatory Visit: Payer: Medicare PPO

## 2023-08-06 ENCOUNTER — Ambulatory Visit (INDEPENDENT_AMBULATORY_CARE_PROVIDER_SITE_OTHER): Payer: Medicare PPO | Admitting: Physician Assistant

## 2023-08-06 ENCOUNTER — Ambulatory Visit (INDEPENDENT_AMBULATORY_CARE_PROVIDER_SITE_OTHER): Payer: Medicare PPO | Admitting: Urology

## 2023-08-06 VITALS — BP 112/63 | HR 71 | Ht 66.0 in | Wt 260.0 lb

## 2023-08-06 DIAGNOSIS — R339 Retention of urine, unspecified: Secondary | ICD-10-CM

## 2023-08-06 LAB — BLADDER SCAN AMB NON-IMAGING: Scan Result: 1

## 2023-08-06 MED ORDER — TAMSULOSIN HCL 0.4 MG PO CAPS: 0.4000 mg | ORAL_CAPSULE | Freq: Every day | ORAL | 11 refills | Status: DC

## 2023-08-06 NOTE — Progress Notes (Signed)
 Marcelle Overlie Plume,acting as a scribe for Vanna Scotland, MD.,have documented all relevant documentation on the behalf of Vanna Scotland, MD,as directed by  Vanna Scotland, MD while in the presence of Vanna Scotland, MD.  08/06/23 11:57 AM   Brian Kramer 11-21-1945 [id number]  Referring provider: Marisue Ivan, MD [phone] [facility] [address] [facility] [place],  [place] [address]  Chief Complaint  Patient presents with   Establish Care   Hospitalization Follow-up    HPI: 78 y/o male referred for follow-up after recent hospital admission and discharged on 08/01/2023 for aspiration pneumonia.   He has multiple comorbidities, including stage three chronic kidney disease, COPD on chronic oxygen therapy, and coronary artery disease, amongst others.  During his hospital stay, he experienced urinary retention with a residual volume of over 950 mL, for which a catheter was placed. He was started on Flomax and discharged with outpatient follow-up arranged.   He passed a voiding trial today. He reports a sense of urgency when needing to urinate, which was present before his recent hospital admission. He was started on Oxybutynin approximately six months ago for urgency, but this medication may have contributed to his urinary retention due to its anticholinergic effects, especially given his other medications for COPD. He has never had a catheter before this hospital admission. He is currently on antibiotics following his hospital discharge.   PMH: Past Medical History:  Diagnosis Date   Anemia    Anginal pain (HCC)    Anxiety    Arthritis    Bilateral renal cysts    CKD (chronic kidney disease), stage III (HCC)    COPD (chronic obstructive pulmonary disease) (HCC)    Coronary artery disease    a.) MI 03/1995 - no PCI; b.) LHC/PCI 1997 - stent (unknown type) x 1 to RCA; c.) LHC/PCI 2000 - stent (unknown type) x 1 to LCx; d.) LHC 04/02/2010 - insig CAD with widely patent  stents; medical mgmt; e.) LHC 04/30/2015 - patent stents; insig CAD; medical mgmt; f.) R/LHC 05/01/20: EF 60%, norm cors.   Depression    Diastolic dysfunction 09/05/2021   a.)  TTE 09/05/2021: EF 50%, mild BAE, trivial PR, mild MR/TR; pHTN (RVSP 50 mmHg), G1DD.   Essential hypertension 04/08/2021   GERD (gastroesophageal reflux disease)    a.) improved s/p sleeve gastrectomy; no daily Tx   History of hiatal hernia 2004   a.) s/p repair   History of obstructive sleep apnea    a.) resolved s/p sleeve gastrectomy; no longer requires nocturnal PAP therapy   HLD (hyperlipidemia)    Incomplete right bundle branch block (RBBB)    Insomnia    Lewy body dementia (HCC)    Long term current use of aspirin    Loosening of knee joint prosthesis (HCC)    Migraines    Myocardial infarction (HCC) 03/1995   a.) full details unknown; LHC performed, however no PCI performed.   On supplemental oxygen by nasal cannula    a.) 2L/[place] PRN   PAD (peripheral artery disease) (HCC) 04/08/2021   Peripheral neuropathy    Pneumonia    Presbycusis    PUD (peptic ulcer disease)    Pulmonary hypertension (HCC)    a.) takes ambrisentan; b.)  R/LHC 05/01/2020: EF 60%, mean PA 30, mean PCWP 16, RVEDP 19, Ao sat 95, PA sat 74, CO 8.2, PVR 137, SVR 736; c.)  TTE 09/05/2021: EF 50%, RVSP 50 mmHg.   RLS (restless legs syndrome)    a.)  takes ropinirole   SOB (shortness of breath) 06/28/2022   Thrombocytopenia (HCC)    Tremor    Type 2 diabetes, diet controlled [facility])     Surgical History: Past Surgical History:  Procedure Laterality Date   AMPUTATION TOE Right 12/20/2021   Procedure: AMPUTATION TOE - SECOND AND THIRD - 28825;  Surgeon: Linus Galas, DPM;  Location: [facility] OR[facility];  Service: Podiatry;  Laterality: Right;   CARPOMETACARPAL ([facility]) FUSION OF THUMB Right 04/08/2018   Procedure: CARPOMETACARPAL [facility]) FUSION OF THUMB;  Surgeon: Kennedy Bucker, MD;  Location: [facility];  Service: Orthopedics;  Laterality: Right;    CARPOMETACARPAL ([facility]) FUSION OF THUMB Left 12/02/2018   Procedure: CARPOMETACARPAL ([facility]) FUSION OF  LEFT THUMB, DIABETIC;  Surgeon: Kennedy Bucker, MD;  Location: [facility];  Service: Orthopedics;  Laterality: Left;   CATARACT EXTRACTION Bilateral 2015   COLONOSCOPY     COLONOSCOPY WITH PROPOFOL N/A 05/19/2018   Procedure: COLONOSCOPY WITH PROPOFOL;  Surgeon: Toledo, Boykin Nearing, MD;  Location: [facility];  Service: Gastroenterology;  Laterality: N/A;   CORONARY ANGIOPLASTY WITH STENT PLACEMENT Left 1997   Procedure: CORONARY ANGIOPLASTY WITH STENT PLACEMENT; stent x1 (unknown type) to RCA   CORONARY ANGIOPLASTY WITH STENT PLACEMENT Left 2000   Procedure: CORONARY ANGIOPLASTY WITH STENT PLACEMENT; stent x1 (unknown type) to LCx   EXCISION PARTIAL PHALANX Left 10/04/2021   Procedure: EXCISION PARTIAL PHALANX - 2, 3, 4;  Surgeon: Linus Galas, DPM;  Location: [facility];  Service: Podiatry;  Laterality: Left;   EYE SURGERY     cataract bilateral with lens   FLEXOR TENOTOMY  Right 12/20/2021   Procedure: FLEXOR TENOTOMY - FOURTH TOE - 40981;  Surgeon: Linus Galas, DPM;  Location: [facility];  Service: Podiatry;  Laterality: Right;   HAMMER TOE SURGERY Right 01/15/2018   Procedure: HAMMER TOE CORRECTION-2ND & 3RD;  Surgeon: Recardo Evangelist, DPM;  Location: [facility];  Service: Podiatry;  Laterality: Right;   HEEL SPUR RESECTION  2012   INGUINAL HERNIA REPAIR Bilateral    KNEE ARTHROSCOPY Right 2005   KNEE ARTHROSCOPY Left 2006   LAPAROSCOPIC GASTRIC SLEEVE RESECTION N/A 03/17/2016   LEFT HEART CATH AND CORONARY ANGIOGRAPHY Left 04/02/2010   Procedure: LEFT HEART CATH AND CORONARY ANGIOGRAPHY; Location: [facility]; Surgeon: Phil Dopp, MD   LEFT HEART CATH AND CORONARY ANGIOGRAPHY Left 04/30/2015   Procedure: LEFT HEART CATH AND CORONARY ANGIOGRAPHY; Location: [facility]; Surgeon: Aletha Halim, MD   RIGHT AND LEFT HEART CATH Left 05/01/2020   Procedure: RIGHT AND LEFT HEART CATH;  Surgeon: Alwyn Pea,  MD;  Location: [facility];  Service: Cardiovascular;  Laterality: Left;   Right shoulder AC separation surgery in 1995 Right    Stem Cells to left heel  2017   TONSILLECTOMY     TOTAL KNEE ARTHROPLASTY Left 2007   TOTAL KNEE ARTHROPLASTY Right 2015   TOTAL KNEE REVISION Left 04/06/2023   Procedure: Left total knee revision, both components;  Surgeon: Reinaldo Berber, MD;  Location: [facility];  Service: Orthopedics;  Laterality: Left;   ULNAR TUNNEL RELEASE Left 04/14/2019   Procedure: LEFT CUBITAL TUNNEL RELEASE;  Surgeon: Kennedy Bucker, MD;  Location: [facility];  Service: Orthopedics;  Laterality: Left;   UPPER GI ENDOSCOPY  2017   VASECTOMY     1970s   WEIL OSTEOTOMY Right 01/15/2018   Procedure: WEIL OSTEOTOMY-2ND & 3RD;  Surgeon: Recardo Evangelist, DPM;  Location: [facility];  Service: Podiatry;  Laterality: Right;    Home Medications:  Allergies as of 08/06/2023       Reactions   Tape Other (See Comments)   Round, white EKG pads only - "ate holes in my skin" 1997 when stays on skin for longer period Any tape OK if it doesn't stay on too long   Penicillins Itching, Rash, Other (See Comments)   Has patient had a PCN reaction causing immediate rash, facial/tongue/throat swelling, SOB or lightheadedness with hypotension: Yes Has patient had a PCN reaction causing severe rash involving mucus membranes or skin necrosis: No Has patient had a PCN reaction that required hospitalization: No Has patient had a PCN reaction occurring within the last 10 years: No If all of the above answers are "NO", then may proceed with Cephalosporin use.   Shellfish Allergy Rash, Other (See Comments)   PT CAN NOT EAT CRAB or lobster, BUT CAN EAT shrimp. No problems with betadine or iodine        Medication List        Accurate as of August 06, 2023 11:57 AM. If you have any questions, ask your nurse or doctor.          STOP taking these medications    oxybutynin 5 MG 24 hr  tablet Commonly known as: DITROPAN-XL Stopped by: Vanna Scotland       TAKE these medications    acetaminophen 500 MG tablet Commonly known as: TYLENOL Take 2 tablets (1,000 mg total) by mouth every 8 (eight) hours.   albuterol 108 (90 Base) MCG/ACT inhaler Commonly known as: VENTOLIN HFA Inhale 2 puffs into the lungs every 6 (six) hours as needed for wheezing or shortness of breath.   ambrisentan 5 MG tablet Commonly known as: LETAIRIS Take 5 mg by mouth daily.   aspirin EC 81 MG tablet Take 81 mg by mouth at bedtime.   azelastine 0.1 % nasal spray Commonly known as: ASTELIN Place 1 spray into both nostrils 2 (two) times daily as needed for rhinitis or allergies. Use in each nostril as directed   BARIATRIC MULTIVITAMINS/IRON PO Take 1 tablet by mouth daily.   Calcium 500-100 MG-UNIT Chew Chew 500 mg by mouth 2 (two) times daily. Calcium is bariatric chewable with vitamin D included   clobetasol ointment 0.05 % Commonly known as: TEMOVATE Apply 1 Application topically 2 (two) times daily.   DULoxetine 60 MG capsule Commonly known as: CYMBALTA Take 60 mg by mouth in the morning.   DULoxetine HCl 30 MG Csdr Take 30 mg by mouth at bedtime.   ferrous sulfate 325 (65 FE) MG tablet Take 325 mg by mouth daily with breakfast.   ipratropium 0.03 % nasal spray Commonly known as: ATROVENT Place 2 sprays into both nostrils 3 (three) times daily as needed for rhinitis.   Lyrica 200 MG capsule Generic drug: pregabalin Take 200 mg by mouth 3 (three) times daily.   mirtazapine 15 MG tablet Commonly known as: REMERON Take 15 mg by mouth at bedtime.   mometasone 0.1 % lotion Commonly known as: ELOCON 1-4 drops See admin instructions. 1-4 drops in each ear as needed for itching   nitroGLYCERIN 0.4 MG SL tablet Commonly known as: NITROSTAT Place 0.4 mg under the tongue every 5 (five) minutes as needed for chest pain.   OXYGEN Inhale 2 L into the lungs at bedtime.    predniSONE 20 MG tablet Commonly known as: DELTASONE Take 2 tablets (40 mg total) by mouth daily with breakfast for 3 days, THEN 1 tablet (20 mg total) daily  with breakfast for 3 days, THEN 0.5 tablets (10 mg total) daily with breakfast for 3 days. Start taking on: August 01, 2023   QUEtiapine 25 MG tablet Commonly known as: SEROQUEL Take 25 mg by mouth at bedtime.   rOPINIRole 4 MG 24 hr tablet Commonly known as: REQUIP XL Take 4 mg by mouth at bedtime.   rosuvastatin 5 MG tablet Commonly known as: CRESTOR Take 5 mg by mouth every evening.   tamsulosin 0.4 MG Caps capsule Commonly known as: FLOMAX Take 1 capsule (0.4 mg total) by mouth daily.   Trelegy Ellipta 100-62.5-25 MCG/ACT Aepb Generic drug: Fluticasone-Umeclidin-Vilant Inhale 1 puff into the lungs daily.        Allergies:  Allergies  Allergen Reactions   Tape Other (See Comments)    Round, white EKG pads only - "ate holes in my skin" 1997 when stays on skin for longer period Any tape OK if it doesn't stay on too long   Penicillins Itching, Rash and Other (See Comments)    Has patient had a PCN reaction causing immediate rash, facial/tongue/throat swelling, SOB or lightheadedness with hypotension: Yes Has patient had a PCN reaction causing severe rash involving mucus membranes or skin necrosis: No Has patient had a PCN reaction that required hospitalization: No Has patient had a PCN reaction occurring within the last 10 years: No If all of the above answers are "NO", then may proceed with Cephalosporin use.    Shellfish Allergy Rash and Other (See Comments)    PT CAN NOT EAT CRAB or lobster, BUT CAN EAT shrimp. No problems with betadine or iodine    Family History: Family History  Problem Relation Age of Onset   Hypertension Mother    Hypertension Father    Stroke Father    Hypertension Maternal Grandmother     Social History:  reports that he quit smoking about 29 years ago. His smoking use  included cigarettes. He has never used smokeless tobacco. He reports current alcohol use of about 1.0 - 2.0 standard drink of alcohol per week. He reports that he does not use drugs.   Physical Exam: BP 112/63   Pulse 71   Ht 5\' 6"  (1.676 m)   Wt 260 lb (117.9 kg)   BMI 41.97 kg/m   Constitutional:  Alert and oriented, No acute distress. HEENT:  AT, moist mucus membranes.  Trachea midline, no masses. Neurologic: Grossly intact, no focal deficits, moving all 4 extremities. Psychiatric: Normal mood and affect.   Assessment & Plan:    1. Urinary retention - Likely secondary to polypharmacy, medical comorbidities, and acute illness - He was successfully able to void after catheter removal, likely due to the initiation of Flomax.  - Oxybutynin was discontinued as it may exacerbate urinary retention - Advised to return for a follow-up visit later today to ensure continued ability to void and to prevent emergency room visits due to urinary retention.  - Flomax will be continued for another month, and he will be reassessed in one month to determine the need for ongoing treatment.  3. Prostate screening - He does not have a recent PSA test - Given the his age and comorbidities, this would be contraindicated - A discussion regarding PSA screening will be deferred to the follow-up visit in one month   Return in about 1 month (around 09/03/2023) for reassessment of urinary symptoms, continuation of Flomax, and consideration of PSA screening.  I have reviewed the above documentation for accuracy and completeness, and  I agree with the above.   Vanna Scotland, MD    [facility] [address] [place], [place] [address] [phone]

## 2023-08-06 NOTE — Progress Notes (Unsigned)
 Fill and Pull Catheter Removal  Patient is present today for a catheter removal.  Patient was cleaned and prepped in a sterile fashion 200 ml of sterile water/ saline was instilled into the bladder when the patient felt the urge to urinate. 7 ml of water was then drained from the balloon.  A 16FR foley cath was removed from the bladder no complications were noted .  Patient as then given some time to void on their own.  Patient can void  180 ml on their own after some time.  Patient tolerated well.  Performed by: Jabri Blancett H RMA  Follow up/ Additional notes: f/u in the pm for PVR

## 2023-08-06 NOTE — Progress Notes (Signed)
 Afternoon follow-up  Patient returned to clinic this afternoon for PVR. He has been able to urinate twice without difficulty. PVR 1mL.  Results for orders placed or performed in visit on 08/06/23  BLADDER SCAN AMB NON-IMAGING   Collection Time: 08/06/23  2:54 PM  Result Value Ref Range   Scan Result 1 ml     Voiding trial passed. Counseled patient to keep planned follow up in 1 month; he agreed.  Follow up: Return in about 4 weeks (around 09/03/2023) for Keep follow-up as scheduled.

## 2023-08-10 ENCOUNTER — Ambulatory Visit: Payer: Medicare PPO

## 2023-08-12 ENCOUNTER — Ambulatory Visit: Payer: Medicare PPO

## 2023-08-17 ENCOUNTER — Encounter: Payer: Self-pay | Admitting: Oncology

## 2023-08-17 ENCOUNTER — Ambulatory Visit: Payer: Medicare PPO

## 2023-08-17 ENCOUNTER — Inpatient Hospital Stay: Attending: Oncology | Admitting: Oncology

## 2023-08-17 ENCOUNTER — Inpatient Hospital Stay

## 2023-08-17 VITALS — BP 112/44 | HR 71 | Temp 97.6°F | Resp 19 | Wt 260.0 lb

## 2023-08-17 DIAGNOSIS — Z87891 Personal history of nicotine dependence: Secondary | ICD-10-CM | POA: Insufficient documentation

## 2023-08-17 DIAGNOSIS — I129 Hypertensive chronic kidney disease with stage 1 through stage 4 chronic kidney disease, or unspecified chronic kidney disease: Secondary | ICD-10-CM | POA: Insufficient documentation

## 2023-08-17 DIAGNOSIS — I509 Heart failure, unspecified: Secondary | ICD-10-CM | POA: Insufficient documentation

## 2023-08-17 DIAGNOSIS — F0283 Dementia in other diseases classified elsewhere, unspecified severity, with mood disturbance: Secondary | ICD-10-CM | POA: Insufficient documentation

## 2023-08-17 DIAGNOSIS — G4730 Sleep apnea, unspecified: Secondary | ICD-10-CM | POA: Diagnosis not present

## 2023-08-17 DIAGNOSIS — D631 Anemia in chronic kidney disease: Secondary | ICD-10-CM | POA: Insufficient documentation

## 2023-08-17 DIAGNOSIS — E1151 Type 2 diabetes mellitus with diabetic peripheral angiopathy without gangrene: Secondary | ICD-10-CM | POA: Insufficient documentation

## 2023-08-17 DIAGNOSIS — I2510 Atherosclerotic heart disease of native coronary artery without angina pectoris: Secondary | ICD-10-CM | POA: Diagnosis not present

## 2023-08-17 DIAGNOSIS — K219 Gastro-esophageal reflux disease without esophagitis: Secondary | ICD-10-CM | POA: Insufficient documentation

## 2023-08-17 DIAGNOSIS — Z8711 Personal history of peptic ulcer disease: Secondary | ICD-10-CM | POA: Insufficient documentation

## 2023-08-17 DIAGNOSIS — N1831 Chronic kidney disease, stage 3a: Secondary | ICD-10-CM | POA: Insufficient documentation

## 2023-08-17 DIAGNOSIS — E1122 Type 2 diabetes mellitus with diabetic chronic kidney disease: Secondary | ICD-10-CM | POA: Insufficient documentation

## 2023-08-17 DIAGNOSIS — Z7982 Long term (current) use of aspirin: Secondary | ICD-10-CM | POA: Insufficient documentation

## 2023-08-17 DIAGNOSIS — G3183 Dementia with Lewy bodies: Secondary | ICD-10-CM | POA: Insufficient documentation

## 2023-08-17 DIAGNOSIS — Z79899 Other long term (current) drug therapy: Secondary | ICD-10-CM | POA: Insufficient documentation

## 2023-08-17 DIAGNOSIS — D696 Thrombocytopenia, unspecified: Secondary | ICD-10-CM

## 2023-08-17 DIAGNOSIS — F0284 Dementia in other diseases classified elsewhere, unspecified severity, with anxiety: Secondary | ICD-10-CM | POA: Diagnosis not present

## 2023-08-17 DIAGNOSIS — J449 Chronic obstructive pulmonary disease, unspecified: Secondary | ICD-10-CM | POA: Insufficient documentation

## 2023-08-17 DIAGNOSIS — E785 Hyperlipidemia, unspecified: Secondary | ICD-10-CM | POA: Insufficient documentation

## 2023-08-17 DIAGNOSIS — D509 Iron deficiency anemia, unspecified: Secondary | ICD-10-CM | POA: Insufficient documentation

## 2023-08-17 DIAGNOSIS — I252 Old myocardial infarction: Secondary | ICD-10-CM | POA: Diagnosis not present

## 2023-08-17 DIAGNOSIS — G2581 Restless legs syndrome: Secondary | ICD-10-CM | POA: Insufficient documentation

## 2023-08-17 DIAGNOSIS — I272 Pulmonary hypertension, unspecified: Secondary | ICD-10-CM | POA: Diagnosis not present

## 2023-08-17 LAB — HIV ANTIBODY (ROUTINE TESTING W REFLEX): HIV Screen 4th Generation wRfx: NONREACTIVE

## 2023-08-17 LAB — CBC WITH DIFFERENTIAL/PLATELET
Abs Immature Granulocytes: 0.02 10*3/uL (ref 0.00–0.07)
Basophils Absolute: 0 10*3/uL (ref 0.0–0.1)
Basophils Relative: 0 %
Eosinophils Absolute: 0.2 10*3/uL (ref 0.0–0.5)
Eosinophils Relative: 4 %
HCT: 31.6 % — ABNORMAL LOW (ref 39.0–52.0)
Hemoglobin: 9.8 g/dL — ABNORMAL LOW (ref 13.0–17.0)
Immature Granulocytes: 0 %
Lymphocytes Relative: 12 %
Lymphs Abs: 0.7 10*3/uL (ref 0.7–4.0)
MCH: 29.9 pg (ref 26.0–34.0)
MCHC: 31 g/dL (ref 30.0–36.0)
MCV: 96.3 fL (ref 80.0–100.0)
Monocytes Absolute: 0.3 10*3/uL (ref 0.1–1.0)
Monocytes Relative: 6 %
Neutro Abs: 4.4 10*3/uL (ref 1.7–7.7)
Neutrophils Relative %: 78 %
Platelets: 134 10*3/uL — ABNORMAL LOW (ref 150–400)
RBC: 3.28 MIL/uL — ABNORMAL LOW (ref 4.22–5.81)
RDW: 15.2 % (ref 11.5–15.5)
Smear Review: DECREASED
WBC: 5.6 10*3/uL (ref 4.0–10.5)
nRBC: 0 % (ref 0.0–0.2)

## 2023-08-17 LAB — HEPATITIS PANEL, ACUTE
HCV Ab: NONREACTIVE
Hep A IgM: NONREACTIVE
Hep B C IgM: NONREACTIVE
Hepatitis B Surface Ag: NONREACTIVE

## 2023-08-17 LAB — LACTATE DEHYDROGENASE: LDH: 154 U/L (ref 98–192)

## 2023-08-17 LAB — TECHNOLOGIST SMEAR REVIEW: Plt Morphology: DECREASED

## 2023-08-17 LAB — RETIC PANEL
Immature Retic Fract: 16.6 % — ABNORMAL HIGH (ref 2.3–15.9)
RBC.: 3.25 MIL/uL — ABNORMAL LOW (ref 4.22–5.81)
Retic Count, Absolute: 91.3 10*3/uL (ref 19.0–186.0)
Retic Ct Pct: 2.8 % (ref 0.4–3.1)
Reticulocyte Hemoglobin: 30.6 pg (ref >27.9)

## 2023-08-17 LAB — FOLATE: Folate: 15.7 ng/mL (ref >5.9)

## 2023-08-17 LAB — IMMATURE PLATELET FRACTION: Immature Platelet Fraction: 1.4 % (ref 1.2–8.6)

## 2023-08-17 NOTE — Progress Notes (Signed)
 Hematology/Oncology Progress note [phone] [phone]           REFERRING PROVIDER: Marisue Ivan, MD   CHIEF COMPLAINTS/REASON FOR VISIT:  Evaluation of anemia   ASSESSMENT & PLAN:   Anemia in stage 3a chronic kidney disease (HCC) Likely anemia due to CKD. Rule out other etiologies.  Lab Results  Component Value Date   HGB 9.8 (L) 08/17/2023   TIBC 279 07/31/2023   IRONPCTSAT 5 (L) 07/31/2023   FERRITIN 36 07/31/2023    I discussed about option of IV Venofer treatments to improve iron store.  He tolerated Venfoer 300mg  during his hospitalization.  I discussed about the potential risks including but not limited to allergic reactions/infusion reactions including anaphylactic reactions, diarrhea, phlebitis, high blood pressure, wheezing, SOB, skin rash, weight gain,dark urine, leg swelling, back pain, headache, nausea and fatigue, etc. Patient agrees with IV Venofer.  Plan IV venofer weekly x 3   Thrombocytopenia (HCC) Pending work up  Check LDH, flowcytometry, B12, folate, HIV hepatitis.   Orders Placed This Encounter  Procedures   CBC with Differential/Platelet    Standing Status:   Future    Number of Occurrences:   1    Expected Date:   08/17/2023    Expiration Date:   08/16/2024   Retic Panel    Standing Status:   Future    Number of Occurrences:   1    Expected Date:   08/17/2023    Expiration Date:   08/16/2024   Kappa/lambda light chains    Standing Status:   Future    Number of Occurrences:   1    Expected Date:   08/17/2023    Expiration Date:   08/16/2024   Multiple Myeloma Panel (SPEP&IFE w/QIG)    Standing Status:   Future    Number of Occurrences:   1    Expected Date:   08/17/2023    Expiration Date:   08/16/2024   Lactate dehydrogenase    Standing Status:   Future    Number of Occurrences:   1    Expected Date:   08/17/2023    Expiration Date:   08/16/2024   Folate    Standing Status:   Future    Number of Occurrences:    1    Expected Date:   08/17/2023    Expiration Date:   08/16/2024   Technologist smear review    Standing Status:   Future    Number of Occurrences:   1    Expected Date:   08/17/2023    Expiration Date:   08/16/2024    Clinical information::   anemia thrombocytopenia   Immature Platelet Fraction    Standing Status:   Future    Number of Occurrences:   1    Expected Date:   08/17/2023    Expiration Date:   08/16/2024   Hepatitis panel, acute    Standing Status:   Future    Number of Occurrences:   1    Expected Date:   08/17/2023    Expiration Date:   08/16/2024   HIV Antibody (routine testing w rflx)    Standing Status:   Future    Number of Occurrences:   1    Expected Date:   08/17/2023    Expiration Date:   08/16/2024   Flow cytometry panel-leukemia/lymphoma work-up    Standing Status:   Future    Number of Occurrences:   1    Expected  Date:   08/17/2023    Expiration Date:   08/16/2024   CBC with Differential (Cancer Center Only)    Standing Status:   Future    Expected Date:   09/28/2023    Expiration Date:   08/16/2024   Retic Panel    Standing Status:   Future    Expected Date:   09/28/2023    Expiration Date:   08/16/2024   Ferritin    Standing Status:   Future    Expected Date:   09/28/2023    Expiration Date:   08/16/2024   Iron and TIBC    Standing Status:   Future    Expected Date:   09/28/2023    Expiration Date:   08/16/2024   Follow up 6 weeks.  All questions were answered. The patient knows to call the clinic with any problems, questions or concerns.  Rickard Patience, MD, PhD [facility] Hematology Oncology 08/17/2023   HISTORY OF PRESENTING ILLNESS:   Brian Kramer is a  78 y.o.  male with PMH listed below was seen in consultation at the request of  Marisue Ivan, MD  for evaluation of anemia.   He was hospitalized for pneumonia and acute on chronic respiratory failure,  He has chronic respiratory failure, has been on oxygen for a couple of years. A CT scan  during his hospital stay showed some lymph node enlargement, which may be reactive to pneumonia.  He is experiencing anemia with low hemoglobin and platelet counts, which have been dropping recently. No chest pain, shortness of breath, or palpitations.he received a dose of Venofer on 08/01/2023 and tolerated well.  He has chronic kidney disease stage 3A, which is stable and not worsening.   He is currently taking an iron supplement labeled as 65 mg, which is equivalent to 325 mg of the whole compound. He also receives iron from a multivitamin.   He served in [employer] for twenty years, during which he drank and smoked. He currently drinks alcohol occasionally, about one mixed drink a week.    MEDICAL HISTORY:  Past Medical History:  Diagnosis Date   Anemia    Anginal pain (HCC)    Anxiety    Arthritis    Bilateral renal cysts    CKD (chronic kidney disease), stage III (HCC)    COPD (chronic obstructive pulmonary disease) (HCC)    Coronary artery disease    a.) MI 03/1995 - no PCI; b.) LHC/PCI 1997 - stent (unknown type) x 1 to RCA; c.) LHC/PCI 2000 - stent (unknown type) x 1 to LCx; d.) LHC 04/02/2010 - insig CAD with widely patent stents; medical mgmt; e.) LHC 04/30/2015 - patent stents; insig CAD; medical mgmt; f.) R/LHC 05/01/20: EF 60%, norm cors.   Depression    Diastolic dysfunction 09/05/2021   a.)  TTE 09/05/2021: EF 50%, mild BAE, trivial PR, mild MR/TR; pHTN (RVSP 50 mmHg), G1DD.   Essential hypertension 04/08/2021   GERD (gastroesophageal reflux disease)    a.) improved s/p sleeve gastrectomy; no daily Tx   History of hiatal hernia 2004   a.) s/p repair   History of obstructive sleep apnea    a.) resolved s/p sleeve gastrectomy; no longer requires nocturnal PAP therapy   HLD (hyperlipidemia)    Incomplete right bundle branch block (RBBB)    Insomnia    Lewy body dementia (HCC)    Long term current use of aspirin    Loosening of knee joint prosthesis (HCC)  Migraines     Myocardial infarction [facility]) 03/1995   a.) full details unknown; LHC performed, however no PCI performed.   On supplemental oxygen by nasal cannula    a.) 2L/[place] PRN   PAD (peripheral artery disease) (HCC) 04/08/2021   Peripheral neuropathy    Pneumonia    Presbycusis    PUD (peptic ulcer disease)    Pulmonary hypertension (HCC)    a.) takes ambrisentan; b.)  R/LHC 05/01/2020: EF 60%, mean PA 30, mean PCWP 16, RVEDP 19, Ao sat 95, PA sat 74, CO 8.2, PVR 137, SVR 736; c.)  TTE 09/05/2021: EF 50%, RVSP 50 mmHg.   RLS (restless legs syndrome)    a.) takes ropinirole   SOB (shortness of breath) 06/28/2022   Thrombocytopenia (HCC)    Tremor    Type 2 diabetes, diet controlled (HCC)     SURGICAL HISTORY: Past Surgical History:  Procedure Laterality Date   AMPUTATION TOE Right 12/20/2021   Procedure: AMPUTATION TOE - SECOND AND THIRD - 28825;  Surgeon: Linus Galas, DPM;  Location: [facility] OR[facility];  Service: Podiatry;  Laterality: Right;   CARPOMETACARPAL ([facility]) FUSION OF THUMB Right 04/08/2018   Procedure: CARPOMETACARPAL [facility]) FUSION OF THUMB;  Surgeon: Kennedy Bucker, MD;  Location: [facility];  Service: Orthopedics;  Laterality: Right;   CARPOMETACARPAL ([facility]) FUSION OF THUMB Left 12/02/2018   Procedure: CARPOMETACARPAL ([facility]) FUSION OF  LEFT THUMB, DIABETIC;  Surgeon: Kennedy Bucker, MD;  Location: [facility];  Service: Orthopedics;  Laterality: Left;   CATARACT EXTRACTION Bilateral 2015   COLONOSCOPY     COLONOSCOPY WITH PROPOFOL N/A 05/19/2018   Procedure: COLONOSCOPY WITH PROPOFOL;  Surgeon: Toledo, Boykin Nearing, MD;  Location: [facility];  Service: Gastroenterology;  Laterality: N/A;   CORONARY ANGIOPLASTY WITH STENT PLACEMENT Left 1997   Procedure: CORONARY ANGIOPLASTY WITH STENT PLACEMENT; stent x1 (unknown type) to RCA   CORONARY ANGIOPLASTY WITH STENT PLACEMENT Left 2000   Procedure: CORONARY ANGIOPLASTY WITH STENT PLACEMENT; stent x1 (unknown type) to LCx   EXCISION PARTIAL PHALANX Left  10/04/2021   Procedure: EXCISION PARTIAL PHALANX - 2, 3, 4;  Surgeon: Linus Galas, DPM;  Location: [facility];  Service: Podiatry;  Laterality: Left;   EYE SURGERY     cataract bilateral with lens   FLEXOR TENOTOMY  Right 12/20/2021   Procedure: FLEXOR TENOTOMY - FOURTH TOE - 09811;  Surgeon: Linus Galas, DPM;  Location: [facility];  Service: Podiatry;  Laterality: Right;   HAMMER TOE SURGERY Right 01/15/2018   Procedure: HAMMER TOE CORRECTION-2ND & 3RD;  Surgeon: Recardo Evangelist, DPM;  Location: [facility];  Service: Podiatry;  Laterality: Right;   HEEL SPUR RESECTION  2012   INGUINAL HERNIA REPAIR Bilateral    KNEE ARTHROSCOPY Right 2005   KNEE ARTHROSCOPY Left 2006   LAPAROSCOPIC GASTRIC SLEEVE RESECTION N/A 03/17/2016   LEFT HEART CATH AND CORONARY ANGIOGRAPHY Left 04/02/2010   Procedure: LEFT HEART CATH AND CORONARY ANGIOGRAPHY; Location: [facility]; Surgeon: Phil Dopp, MD   LEFT HEART CATH AND CORONARY ANGIOGRAPHY Left 04/30/2015   Procedure: LEFT HEART CATH AND CORONARY ANGIOGRAPHY; Location: [facility]; Surgeon: Aletha Halim, MD   RIGHT AND LEFT HEART CATH Left 05/01/2020   Procedure: RIGHT AND LEFT HEART CATH;  Surgeon: Alwyn Pea, MD;  Location: [facility];  Service: Cardiovascular;  Laterality: Left;   Right shoulder AC separation surgery in 1995 Right    Stem Cells to left heel  2017   TONSILLECTOMY     TOTAL KNEE ARTHROPLASTY  Left 2007   TOTAL KNEE ARTHROPLASTY Right 2015   TOTAL KNEE REVISION Left 04/06/2023   Procedure: Left total knee revision, both components;  Surgeon: Reinaldo Berber, MD;  Location: [facility];  Service: Orthopedics;  Laterality: Left;   ULNAR TUNNEL RELEASE Left 04/14/2019   Procedure: LEFT CUBITAL TUNNEL RELEASE;  Surgeon: Kennedy Bucker, MD;  Location: [facility];  Service: Orthopedics;  Laterality: Left;   UPPER GI ENDOSCOPY  2017   VASECTOMY     1970s   WEIL OSTEOTOMY Right 01/15/2018   Procedure: WEIL OSTEOTOMY-2ND & 3RD;  Surgeon: Recardo Evangelist, DPM;  Location: [facility];  Service: Podiatry;  Laterality: Right;    SOCIAL HISTORY: Social History   Socioeconomic History   Marital status: Married    Spouse name: phyllis   Number of children: Not on file   Years of education: Not on file   Highest education level: Not on file  Occupational History   Occupation: Paediatric nurse    Comment: retired  Tobacco Use   Smoking status: Former    Current packs/day: 0.00    Types: Cigarettes    Quit date: 03/09/1994    Years since quitting: 29.4   Smokeless tobacco: Never  Vaping Use   Vaping status: Never Used  Substance and Sexual Activity   Alcohol use: Yes    Alcohol/week: 1.0 - 2.0 standard drink of alcohol    Types: 1 - 2 Standard drinks or equivalent per week    Comment: 1-2 per week   Drug use: Never   Sexual activity: Not Currently    Birth control/protection: None  Other Topics Concern   Not on file  Social History Narrative   Not on file   Social Drivers of Health   Financial Resource Strain: Low Risk  (08/04/2023)   Received [facility]   Overall Financial Resource Strain (CARDIA)    Difficulty of Paying Living Expenses: Not hard at all  Food Insecurity: No Food Insecurity (08/17/2023)   Hunger Vital Sign    Worried About Running Out of Food in the Last Year: Never true    Ran Out of Food in the Last Year: Never true  Transportation Needs: No Transportation Needs (08/17/2023)   PRAPARE - Administrator, Civil Service (Medical): No    Lack of Transportation (Non-Medical): No  Physical Activity: Not on file  Stress: Not on file  Social Connections: Socially Isolated (07/31/2023)   Social Connection and Isolation Panel [NHANES]    Frequency of Communication with Friends and Family: Never    Frequency of Social Gatherings with Friends and Family: Twice a week    Attends Religious Services: Never    Database administrator or Organizations: No    Attends Tax inspector Meetings: Never    Marital Status: Married  Catering manager Violence: Not At Risk (08/17/2023)   Humiliation, Afraid, Rape, and Kick questionnaire    Fear of Current or Ex-Partner: No    Emotionally Abused: No    Physically Abused: No    Sexually Abused: No    FAMILY HISTORY: Family History  Problem Relation Age of Onset   Hypertension Mother    Hypertension Father    Stroke Father    Hypertension Maternal Grandmother     ALLERGIES:  is allergic to tape, penicillins, and shellfish allergy.  MEDICATIONS:  Current Outpatient Medications  Medication Sig Dispense Refill   albuterol (VENTOLIN HFA) 108 (90 Base) MCG/ACT inhaler Inhale  2 puffs into the lungs every 6 (six) hours as needed for wheezing or shortness of breath.     ambrisentan (LETAIRIS) 5 MG tablet Take 5 mg by mouth daily.     aspirin EC 81 MG tablet Take 81 mg by mouth at bedtime.      azelastine (ASTELIN) 0.1 % nasal spray Place 1 spray into both nostrils 2 (two) times daily as needed for rhinitis or allergies. Use in each nostril as directed     Calcium 500-100 MG-UNIT CHEW Chew 500 mg by mouth 2 (two) times daily. Calcium is bariatric chewable with vitamin D included     clobetasol ointment (TEMOVATE) 0.05 % Apply 1 Application topically 2 (two) times daily.     DULoxetine (CYMBALTA) 60 MG capsule Take 60 mg by mouth in the morning.     DULoxetine HCl 30 MG CSDR Take 30 mg by mouth at bedtime.     ferrous sulfate 325 (65 FE) MG tablet Take 325 mg by mouth daily with breakfast.     Fluticasone-Umeclidin-Vilant (TRELEGY ELLIPTA) 100-62.5-25 MCG/ACT AEPB Inhale 1 puff into the lungs daily.     ipratropium (ATROVENT) 0.03 % nasal spray Place 2 sprays into both nostrils 3 (three) times daily as needed for rhinitis.      LOKELMA 5 g packet Take by mouth.     LYRICA 200 MG capsule Take 200 mg by mouth 3 (three) times daily.      mirtazapine (REMERON) 15 MG tablet Take 15 mg by mouth at bedtime.     mometasone  (ELOCON) 0.1 % lotion 1-4 drops See admin instructions. 1-4 drops in each ear as needed for itching     Multiple Vitamins-Minerals (BARIATRIC MULTIVITAMINS/IRON PO) Take 1 tablet by mouth daily.     nitroGLYCERIN (NITROSTAT) 0.4 MG SL tablet Place 0.4 mg under the tongue every 5 (five) minutes as needed for chest pain.      OXYGEN Inhale 2 L into the lungs at bedtime.     QUEtiapine (SEROQUEL) 25 MG tablet Take 25 mg by mouth at bedtime.     rOPINIRole (REQUIP XL) 4 MG 24 hr tablet Take 4 mg by mouth at bedtime.     rosuvastatin (CRESTOR) 5 MG tablet Take 5 mg by mouth every evening.     tamsulosin (FLOMAX) 0.4 MG CAPS capsule Take 1 capsule (0.4 mg total) by mouth daily. 30 capsule 11   acetaminophen (TYLENOL) 500 MG tablet Take 2 tablets (1,000 mg total) by mouth every 8 (eight) hours. 30 tablet 0   No current facility-administered medications for this visit.    Review of Systems  Constitutional:  Positive for fever. Negative for appetite change, chills and unexpected weight change.  HENT:   Negative for hearing loss and voice change.   Eyes:  Negative for eye problems and icterus.  Respiratory:  Positive for shortness of breath. Negative for chest tightness and cough.   Cardiovascular:  Negative for chest pain and leg swelling.  Gastrointestinal:  Negative for abdominal distention, abdominal pain, blood in stool, nausea and vomiting.  Endocrine: Negative for hot flashes.  Genitourinary:  Negative for difficulty urinating, dysuria and frequency.   Musculoskeletal:  Negative for arthralgias.  Skin:  Negative for itching and rash.  Neurological:  Negative for light-headedness and numbness.  Hematological:  Negative for adenopathy. Does not bruise/bleed easily.  Psychiatric/Behavioral:  Negative for confusion.    PHYSICAL EXAMINATION: ECOG PERFORMANCE STATUS: 1 - Symptomatic but completely ambulatory Vitals:   08/17/23 4132  BP: (!) 112/44  Pulse: 71  Resp: 19  Temp: 97.6 F (36.4  C)  SpO2: 98%   Filed Weights   08/17/23 0925  Weight: 260 lb (117.9 kg)    Physical Exam Constitutional:      General: He is not in acute distress.    Appearance: He is obese.  HENT:     Head: Normocephalic and atraumatic.  Eyes:     General: No scleral icterus. Cardiovascular:     Rate and Rhythm: Normal rate and regular rhythm.     Heart sounds: Normal heart sounds.  Pulmonary:     Effort: Pulmonary effort is normal. No respiratory distress.     Comments: Decreased breath sounds bilaterally  Abdominal:     General: Bowel sounds are normal. There is no distension.     Palpations: Abdomen is soft.  Musculoskeletal:        General: No deformity. Normal range of motion.     Cervical back: Normal range of motion and neck supple.  Skin:    General: Skin is warm and dry.     Findings: No erythema or rash.  Neurological:     Mental Status: He is alert and oriented to person, place, and time. Mental status is at baseline.     Cranial Nerves: No cranial nerve deficit.     Coordination: Coordination normal.  Psychiatric:        Mood and Affect: Mood normal.     LABORATORY DATA:  I have reviewed the data as listed    Latest Ref Rng & Units 08/17/2023   10:13 AM 08/01/2023    4:50 AM 07/31/2023    2:35 AM  CBC  WBC 4.0 - 10.5 K/uL 5.6  8.0  12.9   Hemoglobin 13.0 - 17.0 g/dL 9.8  8.1  8.2   Hematocrit 39.0 - 52.0 % 31.6  25.2  25.3   Platelets 150 - 400 K/uL 134  115  129       Latest Ref Rng & Units 08/01/2023    4:50 AM 07/31/2023    2:35 AM 07/30/2023    6:36 AM  CMP  Glucose 70 - 99 mg/dL 161  096  045   BUN 8 - 23 mg/dL 47  52  43   Creatinine 0.61 - 1.24 mg/dL 4.09  8.11  9.14   Sodium 135 - 145 mmol/L 139  138  141   Potassium 3.5 - 5.1 mmol/L 5.1  5.3  4.9   Chloride 98 - 111 mmol/L 109  109  108   CO2 22 - 32 mmol/L 25  24  23    Calcium 8.9 - 10.3 mg/dL 8.4  8.2  8.9   Total Protein 6.5 - 8.1 g/dL   7.4   Total Bilirubin 0.0 - 1.2 mg/dL   0.6   Alkaline  Phos 38 - 126 U/L   70   AST 15 - 41 U/L   27   ALT 0 - 44 U/L   16       RADIOGRAPHIC STUDIES: I have personally reviewed the radiological images as listed and agreed with the findings in the report. CT CHEST WO CONTRAST Result Date: 07/30/2023 CLINICAL DATA:  Pneumonia EXAM: CT CHEST WITHOUT CONTRAST TECHNIQUE: Multidetector CT imaging of the chest was performed following the standard protocol without IV contrast. RADIATION DOSE REDUCTION: This exam was performed according to the departmental dose-optimization program which includes automated exposure control, adjustment of the mA and/or kV according to  patient size and/or use of iterative reconstruction technique. COMPARISON:  Chest radiograph 07/29/1998 FINDINGS: Cardiovascular: Coronary artery calcification and aortic atherosclerotic calcification. Mediastinum/Nodes: Enlarged mediastinal and hilar lymph nodes. Example RIGHT lower paratracheal node measures 21 mm (image 60). RIGHT hilar lymph node measures 26 mm on image 72. Subcarinal node measures 18 mm. No supraclavicular adenopathy. Lungs/Pleura: Dense consolidation in the RIGHT lower lobe air bronchograms. Nodular airspace densities in the RIGHT middle lobe. Mild atelectasis in the LEFT lower lobe. Upper Abdomen: Limited view of the liver, kidneys, pancreas are unremarkable. Normal adrenal glands. Musculoskeletal: No acute osseous abnormality. IMPRESSION: 1. Findings most consistent with multifocal pneumonia the RIGHT lung. Dense consolidation in the RIGHT lower lobe. 2. Bulky mediastinal adenopathy. Differential includes reactive adenopathy related to pneumonia versus adenopathy related to lymphoma or metastatic adenopathy. The lymph nodes are larger than typical reactive adenopathy. Recommend clinical correlation and consider FDG PET scan. Electronically Signed   By: Genevive Bi M.D.   On: 07/30/2023 09:48   DG Chest Port 1 View Result Date: 07/30/2023 CLINICAL DATA:  Chest pressure on  the left side.  Chest pain. EXAM: PORTABLE CHEST 1 VIEW COMPARISON:  04/07/2023 FINDINGS: Low volume film. The cardio pericardial silhouette is enlarged. Vascular congestion with diffuse interstitial opacity is progressive in the interval. There is new right base airspace disease with possible small right pleural effusion. No acute bony abnormality. Telemetry leads overlie the chest. IMPRESSION: 1. Low volume film with progressive vascular congestion and diffuse interstitial opacity. Imaging features suggest edema. 2. New right base airspace disease compatible with pneumonia and possible small right pleural effusion. Electronically Signed   By: Kennith Center M.D.   On: 07/30/2023 07:16

## 2023-08-17 NOTE — Assessment & Plan Note (Addendum)
 Pending work up  Check LDH, flowcytometry, B12, folate, HIV hepatitis.

## 2023-08-17 NOTE — Assessment & Plan Note (Addendum)
 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 Rule out other etiologies.  Lab Results  Component Value Date   HGB 9.8 (L) 08/17/2023   TIBC 279 07/31/2023   IRONPCTSAT 5 (L) 07/31/2023   FERRITIN 36 07/31/2023    I discussed about option of IV Venofer treatments to improve iron store.  He tolerated Venfoer 300mg  during his hospitalization.  I discussed about the potential risks including but not limited to allergic reactions/infusion reactions including anaphylactic reactions, diarrhea, phlebitis, high blood pressure, wheezing, SOB, skin rash, weight gain,dark urine, leg swelling, back pain, headache, nausea and fatigue, etc. Patient agrees with IV Venofer.  Plan IV venofer weekly x 3

## 2023-08-18 LAB — KAPPA/LAMBDA LIGHT CHAINS
Kappa free light chain: 61.6 mg/L — ABNORMAL HIGH (ref 3.3–19.4)
Kappa, lambda light chain ratio: 1.79 — ABNORMAL HIGH (ref 0.26–1.65)
Lambda free light chains: 34.4 mg/L — ABNORMAL HIGH (ref 5.7–26.3)

## 2023-08-19 ENCOUNTER — Inpatient Hospital Stay

## 2023-08-19 ENCOUNTER — Ambulatory Visit: Payer: Medicare PPO

## 2023-08-19 ENCOUNTER — Other Ambulatory Visit: Payer: Self-pay | Admitting: Oncology

## 2023-08-19 VITALS — BP 148/52 | HR 75 | Temp 98.1°F | Resp 20

## 2023-08-19 DIAGNOSIS — N1831 Chronic kidney disease, stage 3a: Secondary | ICD-10-CM

## 2023-08-19 DIAGNOSIS — I129 Hypertensive chronic kidney disease with stage 1 through stage 4 chronic kidney disease, or unspecified chronic kidney disease: Secondary | ICD-10-CM | POA: Diagnosis not present

## 2023-08-19 LAB — COMP PANEL: LEUKEMIA/LYMPHOMA

## 2023-08-19 MED ORDER — SODIUM CHLORIDE 0.9% FLUSH
10.0000 mL | Freq: Once | INTRAVENOUS | Status: AC | PRN
  Administered 2023-08-19: 10 mL
  Filled 2023-08-19: qty 10

## 2023-08-19 MED ORDER — IRON SUCROSE 20 MG/ML IV SOLN
200.0000 mg | Freq: Once | INTRAVENOUS | Status: AC
  Administered 2023-08-19: 200 mg via INTRAVENOUS
  Filled 2023-08-19: qty 10

## 2023-08-20 LAB — MULTIPLE MYELOMA PANEL, SERUM
Albumin SerPl Elph-Mcnc: 3.5 g/dL (ref 2.9–4.4)
Albumin/Glob SerPl: 1.3 (ref 0.7–1.7)
Alpha 1: 0.2 g/dL (ref 0.0–0.4)
Alpha2 Glob SerPl Elph-Mcnc: 0.7 g/dL (ref 0.4–1.0)
B-Globulin SerPl Elph-Mcnc: 0.9 g/dL (ref 0.7–1.3)
Gamma Glob SerPl Elph-Mcnc: 0.9 g/dL (ref 0.4–1.8)
Globulin, Total: 2.8 g/dL (ref 2.2–3.9)
IgA: 250 mg/dL (ref 61–437)
IgG (Immunoglobin G), Serum: 952 mg/dL (ref 603–1613)
IgM (Immunoglobulin M), Srm: 44 mg/dL (ref 15–143)
Total Protein ELP: 6.3 g/dL (ref 6.0–8.5)

## 2023-08-25 ENCOUNTER — Other Ambulatory Visit: Payer: Self-pay | Admitting: Specialist

## 2023-08-25 DIAGNOSIS — R590 Localized enlarged lymph nodes: Secondary | ICD-10-CM

## 2023-08-26 ENCOUNTER — Inpatient Hospital Stay

## 2023-08-26 VITALS — BP 116/61 | HR 56 | Resp 20

## 2023-08-26 DIAGNOSIS — I129 Hypertensive chronic kidney disease with stage 1 through stage 4 chronic kidney disease, or unspecified chronic kidney disease: Secondary | ICD-10-CM | POA: Diagnosis not present

## 2023-08-26 DIAGNOSIS — N1831 Chronic kidney disease, stage 3a: Secondary | ICD-10-CM

## 2023-08-26 MED ORDER — IRON SUCROSE 20 MG/ML IV SOLN
200.0000 mg | Freq: Once | INTRAVENOUS | Status: AC
  Administered 2023-08-26: 200 mg via INTRAVENOUS
  Filled 2023-08-26: qty 10

## 2023-08-26 NOTE — Patient Instructions (Signed)

## 2023-09-02 ENCOUNTER — Inpatient Hospital Stay

## 2023-09-02 VITALS — BP 120/54 | HR 62 | Temp 98.3°F | Resp 18

## 2023-09-02 DIAGNOSIS — N1831 Chronic kidney disease, stage 3a: Secondary | ICD-10-CM

## 2023-09-02 DIAGNOSIS — D631 Anemia in chronic kidney disease: Secondary | ICD-10-CM

## 2023-09-02 DIAGNOSIS — I129 Hypertensive chronic kidney disease with stage 1 through stage 4 chronic kidney disease, or unspecified chronic kidney disease: Secondary | ICD-10-CM | POA: Diagnosis not present

## 2023-09-02 MED ORDER — IRON SUCROSE 20 MG/ML IV SOLN
200.0000 mg | Freq: Once | INTRAVENOUS | Status: AC
  Administered 2023-09-02: 200 mg via INTRAVENOUS

## 2023-09-02 NOTE — Patient Instructions (Signed)

## 2023-09-10 ENCOUNTER — Ambulatory Visit (INDEPENDENT_AMBULATORY_CARE_PROVIDER_SITE_OTHER): Payer: Medicare PPO | Admitting: Physician Assistant

## 2023-09-10 VITALS — BP 115/66 | HR 82

## 2023-09-10 DIAGNOSIS — R399 Unspecified symptoms and signs involving the genitourinary system: Secondary | ICD-10-CM | POA: Diagnosis not present

## 2023-09-10 LAB — BLADDER SCAN AMB NON-IMAGING: Scan Result: 90

## 2023-09-10 MED ORDER — TAMSULOSIN HCL 0.4 MG PO CAPS: 0.4000 mg | ORAL_CAPSULE | Freq: Every day | ORAL | 11 refills | Status: AC

## 2023-09-10 NOTE — Progress Notes (Signed)
 09/10/2023 10:53 AM   Brian Kramer 1945-09-07 [id number]  CC: Chief Complaint  Patient presents with   Follow-up    HPI: Brian Kramer is a 78 y.o. male with PMH CKD 3, COPD, CAD, and a recent history of urinary retention in the setting of hospitalization with aspiration pneumonia who passed a voiding trial with [place] about 6 weeks ago who presents today for follow-up.  He is accompanied today by his wife, who contributes to HPI.  Today he reports he remains on Flomax and is tolerating it well without orthostasis.  He reports some urinary urgency and urge incontinence, less than once daily.  The urgency is somewhat better since starting Flomax.  He wears no absorbent products for this and overall is not particularly bothered by it.  He does not wish to try new medication for the urgency.  He admits that he has a history of holding his urine but has started timed voiding every 2 hours during the daytime.  IPSS 2/mostly satisfied as below.  PVR 90 mL.   IPSS     Row Name 09/10/23 1000         International Prostate Symptom Score   How often have you had the sensation of not emptying your bladder? Not at All     How often have you had to urinate less than every two hours? Not at All     How often have you found you stopped and started again several times when you urinated? Not at All     How often have you found it difficult to postpone urination? Less than 1 in 5 times     How often have you had a weak urinary stream? Not at All     How often have you had to strain to start urination? Not at All     How many times did you typically get up at night to urinate? 1 Time     Total IPSS Score 2       Quality of Life due to urinary symptoms   If you were to spend the rest of your life with your urinary condition just the way it is now how would you feel about that? Mostly Satisfied              PMH: Past Medical History:  Diagnosis Date   Anemia    Anginal pain  (HCC)    Anxiety    Arthritis    Bilateral renal cysts    CKD (chronic kidney disease), stage III (HCC)    COPD (chronic obstructive pulmonary disease) (HCC)    Coronary artery disease    a.) MI 03/1995 - no PCI; b.) LHC/PCI 1997 - stent (unknown type) x 1 to RCA; c.) LHC/PCI 2000 - stent (unknown type) x 1 to LCx; d.) LHC 04/02/2010 - insig CAD with widely patent stents; medical mgmt; e.) LHC 04/30/2015 - patent stents; insig CAD; medical mgmt; f.) R/LHC 05/01/20: EF 60%, norm cors.   Depression    Diastolic dysfunction 09/05/2021   a.)  TTE 09/05/2021: EF 50%, mild BAE, trivial PR, mild MR/TR; pHTN (RVSP 50 mmHg), G1DD.   Essential hypertension 04/08/2021   GERD (gastroesophageal reflux disease)    a.) improved s/p sleeve gastrectomy; no daily Tx   History of hiatal hernia 2004   a.) s/p repair   History of obstructive sleep apnea    a.) resolved s/p sleeve gastrectomy; no longer requires nocturnal PAP therapy   HLD (hyperlipidemia)  Incomplete right bundle branch block (RBBB)    Insomnia    Lewy body dementia (HCC)    Long term current use of aspirin    Loosening of knee joint prosthesis (HCC)    Migraines    Myocardial infarction (HCC) 03/1995   a.) full details unknown; LHC performed, however no PCI performed.   On supplemental oxygen by nasal cannula    a.) 2L/Leroy PRN   PAD (peripheral artery disease) (HCC) 04/08/2021   Peripheral neuropathy    Pneumonia    Presbycusis    PUD (peptic ulcer disease)    Pulmonary hypertension (HCC)    a.) takes ambrisentan; b.)  R/LHC 05/01/2020: EF 60%, mean PA 30, mean PCWP 16, RVEDP 19, Ao sat 95, PA sat 74, CO 8.2, PVR 137, SVR 736; c.)  TTE 09/05/2021: EF 50%, RVSP 50 mmHg.   RLS (restless legs syndrome)    a.) takes ropinirole   SOB (shortness of breath) 06/28/2022   Thrombocytopenia (HCC)    Tremor    Type 2 diabetes, diet controlled [facility])     Surgical History: Past Surgical History:  Procedure Laterality Date   AMPUTATION  TOE Right 12/20/2021   Procedure: AMPUTATION TOE - SECOND AND THIRD - 28825;  Surgeon: Linus Galas, DPM;  Location: [facility] ORS;  Service: Podiatry;  Laterality: Right;   CARPOMETACARPAL ([facility]) FUSION OF THUMB Right 04/08/2018   Procedure: CARPOMETACARPAL [facility]) FUSION OF THUMB;  Surgeon: Kennedy Bucker, MD;  Location: [facility] OR[facility];  Service: Orthopedics;  Laterality: Right;   CARPOMETACARPAL ([facility]) FUSION OF THUMB Left 12/02/2018   Procedure: CARPOMETACARPAL ([facility]) FUSION OF  LEFT THUMB, DIABETIC;  Surgeon: Kennedy Bucker, MD;  Location: [facility];  Service: Orthopedics;  Laterality: Left;   CATARACT EXTRACTION Bilateral 2015   COLONOSCOPY     COLONOSCOPY WITH PROPOFOL N/A 05/19/2018   Procedure: COLONOSCOPY WITH PROPOFOL;  Surgeon: Toledo, Boykin Nearing, MD;  Location: [facility];  Service: Gastroenterology;  Laterality: N/A;   CORONARY ANGIOPLASTY WITH STENT PLACEMENT Left 1997   Procedure: CORONARY ANGIOPLASTY WITH STENT PLACEMENT; stent x1 (unknown type) to RCA   CORONARY ANGIOPLASTY WITH STENT PLACEMENT Left 2000   Procedure: CORONARY ANGIOPLASTY WITH STENT PLACEMENT; stent x1 (unknown type) to LCx   EXCISION PARTIAL PHALANX Left 10/04/2021   Procedure: EXCISION PARTIAL PHALANX - 2, 3, 4;  Surgeon: Linus Galas, DPM;  Location: [facility];  Service: Podiatry;  Laterality: Left;   EYE SURGERY     cataract bilateral with lens   FLEXOR TENOTOMY  Right 12/20/2021   Procedure: FLEXOR TENOTOMY - FOURTH TOE - 09811;  Surgeon: Linus Galas, DPM;  Location: [facility];  Service: Podiatry;  Laterality: Right;   HAMMER TOE SURGERY Right 01/15/2018   Procedure: HAMMER TOE CORRECTION-2ND & 3RD;  Surgeon: Recardo Evangelist, DPM;  Location: [facility];  Service: Podiatry;  Laterality: Right;   HEEL SPUR RESECTION  2012   INGUINAL HERNIA REPAIR Bilateral    KNEE ARTHROSCOPY Right 2005   KNEE ARTHROSCOPY Left 2006   LAPAROSCOPIC GASTRIC SLEEVE RESECTION N/A 03/17/2016   LEFT HEART CATH AND CORONARY ANGIOGRAPHY Left  04/02/2010   Procedure: LEFT HEART CATH AND CORONARY ANGIOGRAPHY; Location: [facility]; Surgeon: Phil Dopp, MD   LEFT HEART CATH AND CORONARY ANGIOGRAPHY Left 04/30/2015   Procedure: LEFT HEART CATH AND CORONARY ANGIOGRAPHY; Location: [facility]; Surgeon: Aletha Halim, MD   RIGHT AND LEFT HEART CATH Left 05/01/2020   Procedure: RIGHT AND LEFT HEART CATH;  Surgeon: Alwyn Pea, MD;  Location: [facility];  Service: Cardiovascular;  Laterality: Left;   Right shoulder AC separation surgery in 1995 Right    Stem Cells to left heel  2017   TONSILLECTOMY     TOTAL KNEE ARTHROPLASTY Left 2007   TOTAL KNEE ARTHROPLASTY Right 2015   TOTAL KNEE REVISION Left 04/06/2023   Procedure: Left total knee revision, both components;  Surgeon: Reinaldo Berber, MD;  Location: [facility];  Service: Orthopedics;  Laterality: Left;   ULNAR TUNNEL RELEASE Left 04/14/2019   Procedure: LEFT CUBITAL TUNNEL RELEASE;  Surgeon: Kennedy Bucker, MD;  Location: [facility];  Service: Orthopedics;  Laterality: Left;   UPPER GI ENDOSCOPY  2017   VASECTOMY     1970s   WEIL OSTEOTOMY Right 01/15/2018   Procedure: WEIL OSTEOTOMY-2ND & 3RD;  Surgeon: Recardo Evangelist, DPM;  Location: [facility];  Service: Podiatry;  Laterality: Right;    Home Medications:  Allergies as of 09/10/2023       Reactions   Tape Other (See Comments)   Round, white EKG pads only - "ate holes in my skin" 1997 when stays on skin for longer period Any tape OK if it doesn't stay on too long   Penicillins Itching, Rash, Other (See Comments)   Has patient had a PCN reaction causing immediate rash, facial/tongue/throat swelling, SOB or lightheadedness with hypotension: Yes Has patient had a PCN reaction causing severe rash involving mucus membranes or skin necrosis: No Has patient had a PCN reaction that required hospitalization: No Has patient had a PCN reaction occurring within the last 10 years: No If all of the above answers are "NO", then may proceed  with Cephalosporin use.   Shellfish Allergy Rash, Other (See Comments)   PT CAN NOT EAT CRAB or lobster, BUT CAN EAT shrimp. No problems with betadine or iodine        Medication List        Accurate as of September 10, 2023 10:53 AM. If you have any questions, ask your nurse or doctor.          STOP taking these medications    acetaminophen 500 MG tablet Commonly known as: TYLENOL   DULoxetine 60 MG capsule Commonly known as: CYMBALTA   DULoxetine HCl 30 MG Csdr   mirtazapine 15 MG tablet Commonly known as: REMERON       TAKE these medications    albuterol 108 (90 Base) MCG/ACT inhaler Commonly known as: VENTOLIN HFA Inhale 2 puffs into the lungs every 6 (six) hours as needed for wheezing or shortness of breath.   ambrisentan 5 MG tablet Commonly known as: LETAIRIS Take 5 mg by mouth daily.   aspirin EC 81 MG tablet Take 81 mg by mouth at bedtime.   azelastine 0.1 % nasal spray Commonly known as: ASTELIN Place 1 spray into both nostrils 2 (two) times daily as needed for rhinitis or allergies. Use in each nostril as directed   BARIATRIC MULTIVITAMINS/IRON PO Take 1 tablet by mouth daily.   Calcium 500-100 MG-UNIT Chew Chew 500 mg by mouth 2 (two) times daily. Calcium is bariatric chewable with vitamin D included   clobetasol ointment 0.05 % Commonly known as: TEMOVATE Apply 1 Application topically 2 (two) times daily.   ferrous sulfate 325 (65 FE) MG tablet Take 325 mg by mouth daily with breakfast.   ipratropium 0.03 % nasal spray Commonly known as: ATROVENT Place 2 sprays into both nostrils 3 (three) times daily as needed for rhinitis.   Lokelma 5 g  packet Generic drug: sodium zirconium cyclosilicate Take by mouth.   Lyrica 200 MG capsule Generic drug: pregabalin Take 200 mg by mouth 3 (three) times daily.   mometasone 0.1 % lotion Commonly known as: ELOCON 1-4 drops See admin instructions. 1-4 drops in each ear as needed for itching    nitroGLYCERIN 0.4 MG SL tablet Commonly known as: NITROSTAT Place 0.4 mg under the tongue every 5 (five) minutes as needed for chest pain.   OXYGEN Inhale 2 L into the lungs at bedtime.   polyethylene glycol 17 g packet Commonly known as: MIRALAX / GLYCOLAX Take by mouth.   QUEtiapine 25 MG tablet Commonly known as: SEROQUEL Take 25 mg by mouth at bedtime.   rOPINIRole 4 MG 24 hr tablet Commonly known as: REQUIP XL Take 4 mg by mouth at bedtime.   rosuvastatin 5 MG tablet Commonly known as: CRESTOR Take 5 mg by mouth every evening.   tamsulosin 0.4 MG Caps capsule Commonly known as: FLOMAX Take 1 capsule (0.4 mg total) by mouth daily.   Trelegy Ellipta 100-62.5-25 MCG/ACT Aepb Generic drug: Fluticasone-Umeclidin-Vilant Inhale 1 puff into the lungs daily.        Allergies:  Allergies  Allergen Reactions   Tape Other (See Comments)    Round, white EKG pads only - "ate holes in my skin" 1997 when stays on skin for longer period Any tape OK if it doesn't stay on too long   Penicillins Itching, Rash and Other (See Comments)    Has patient had a PCN reaction causing immediate rash, facial/tongue/throat swelling, SOB or lightheadedness with hypotension: Yes Has patient had a PCN reaction causing severe rash involving mucus membranes or skin necrosis: No Has patient had a PCN reaction that required hospitalization: No Has patient had a PCN reaction occurring within the last 10 years: No If all of the above answers are "NO", then may proceed with Cephalosporin use.    Shellfish Allergy Rash and Other (See Comments)    PT CAN NOT EAT CRAB or lobster, BUT CAN EAT shrimp. No problems with betadine or iodine    Family History: Family History  Problem Relation Age of Onset   Hypertension Mother    Hypertension Father    Stroke Father    Hypertension Maternal Grandmother     Social History:   reports that he quit smoking about 29 years ago. His smoking use included  cigarettes. He has never used smokeless tobacco. He reports current alcohol use of about 1.0 - 2.0 standard drink of alcohol per week. He reports that he does not use drugs.  Physical Exam: BP 115/66   Pulse 82   Constitutional:  Alert and oriented, no acute distress, nontoxic appearing HEENT: [place], AT Cardiovascular: No clubbing, cyanosis, or edema Respiratory: Normal respiratory effort, no increased work of breathing Skin: No rashes, bruises or suspicious lesions Neurologic: Grossly intact, no focal deficits, moving all 4 extremities Psychiatric: Normal mood and affect  Laboratory Data: Results for orders placed or performed in visit on 09/10/23  BLADDER SCAN AMB NON-IMAGING   Collection Time: 09/10/23 10:14 AM  Result Value Ref Range   Scan Result 90 ml    Assessment & Plan:   1. Lower urinary tract symptoms (LUTS) (Primary) IPSS with mild LUTS and he is emptying appropriately on Flomax without orthostasis.  With improvement in urgency on Flomax, will plan to continue this, suspect underlying BPH.  Will defer pharmacotherapy for urgency unless his symptoms worsen or he becomes more bothered  by them.  We had a lengthy conversation about whether or not to pursue PSA testing today.  He ultimately declined this, which is reasonable. - BLADDER SCAN AMB NON-IMAGING - tamsulosin (FLOMAX) 0.4 MG CAPS capsule; Take 1 capsule (0.4 mg total) by mouth daily.  Dispense: 30 capsule; Refill: 11   Return in about 1 year (around 09/09/2024) for Annual IPSS, PVR.  Carman Ching, PA-C  [facility] [place] [address], [address] [place], [place] [address] [phone]

## 2023-09-17 ENCOUNTER — Ambulatory Visit
Admission: RE | Admit: 2023-09-17 | Discharge: 2023-09-17 | Disposition: A | Discharge status: Home / Self Care | Source: Ambulatory Visit | Attending: Specialist | Admitting: Specialist

## 2023-09-17 DIAGNOSIS — R590 Localized enlarged lymph nodes: Secondary | ICD-10-CM | POA: Diagnosis present

## 2023-09-17 MED ORDER — IOHEXOL 300 MG/ML  SOLN
75.0000 mL | Freq: Once | INTRAMUSCULAR | Status: AC | PRN
Start: 2023-09-17 — End: 2023-09-17
  Administered 2023-09-17: 75 mL via INTRAVENOUS

## 2023-09-18 ENCOUNTER — Other Ambulatory Visit: Payer: Self-pay | Admitting: Oncology

## 2023-09-23 ENCOUNTER — Ambulatory Visit: Attending: Internal Medicine

## 2023-09-23 DIAGNOSIS — G4733 Obstructive sleep apnea (adult) (pediatric): Secondary | ICD-10-CM | POA: Diagnosis not present

## 2023-09-23 DIAGNOSIS — R0681 Apnea, not elsewhere classified: Secondary | ICD-10-CM | POA: Diagnosis present

## 2023-09-28 ENCOUNTER — Inpatient Hospital Stay: Attending: Oncology

## 2023-09-28 DIAGNOSIS — N1831 Chronic kidney disease, stage 3a: Secondary | ICD-10-CM | POA: Insufficient documentation

## 2023-09-28 DIAGNOSIS — Z79899 Other long term (current) drug therapy: Secondary | ICD-10-CM | POA: Diagnosis not present

## 2023-09-28 DIAGNOSIS — I129 Hypertensive chronic kidney disease with stage 1 through stage 4 chronic kidney disease, or unspecified chronic kidney disease: Secondary | ICD-10-CM | POA: Insufficient documentation

## 2023-09-28 DIAGNOSIS — K219 Gastro-esophageal reflux disease without esophagitis: Secondary | ICD-10-CM | POA: Insufficient documentation

## 2023-09-28 DIAGNOSIS — Z8711 Personal history of peptic ulcer disease: Secondary | ICD-10-CM | POA: Diagnosis not present

## 2023-09-28 DIAGNOSIS — G3183 Dementia with Lewy bodies: Secondary | ICD-10-CM | POA: Insufficient documentation

## 2023-09-28 DIAGNOSIS — I252 Old myocardial infarction: Secondary | ICD-10-CM | POA: Insufficient documentation

## 2023-09-28 DIAGNOSIS — J449 Chronic obstructive pulmonary disease, unspecified: Secondary | ICD-10-CM | POA: Diagnosis not present

## 2023-09-28 DIAGNOSIS — Z87891 Personal history of nicotine dependence: Secondary | ICD-10-CM | POA: Insufficient documentation

## 2023-09-28 DIAGNOSIS — E1151 Type 2 diabetes mellitus with diabetic peripheral angiopathy without gangrene: Secondary | ICD-10-CM | POA: Diagnosis not present

## 2023-09-28 DIAGNOSIS — I2510 Atherosclerotic heart disease of native coronary artery without angina pectoris: Secondary | ICD-10-CM | POA: Diagnosis not present

## 2023-09-28 DIAGNOSIS — E785 Hyperlipidemia, unspecified: Secondary | ICD-10-CM | POA: Diagnosis not present

## 2023-09-28 DIAGNOSIS — D631 Anemia in chronic kidney disease: Secondary | ICD-10-CM | POA: Insufficient documentation

## 2023-09-28 DIAGNOSIS — G2581 Restless legs syndrome: Secondary | ICD-10-CM | POA: Diagnosis not present

## 2023-09-28 DIAGNOSIS — D696 Thrombocytopenia, unspecified: Secondary | ICD-10-CM | POA: Insufficient documentation

## 2023-09-28 DIAGNOSIS — I272 Pulmonary hypertension, unspecified: Secondary | ICD-10-CM | POA: Insufficient documentation

## 2023-09-28 DIAGNOSIS — E1122 Type 2 diabetes mellitus with diabetic chronic kidney disease: Secondary | ICD-10-CM | POA: Insufficient documentation

## 2023-09-28 DIAGNOSIS — Z7982 Long term (current) use of aspirin: Secondary | ICD-10-CM | POA: Diagnosis not present

## 2023-09-28 DIAGNOSIS — F0283 Dementia in other diseases classified elsewhere, unspecified severity, with mood disturbance: Secondary | ICD-10-CM | POA: Insufficient documentation

## 2023-09-28 DIAGNOSIS — G4733 Obstructive sleep apnea (adult) (pediatric): Secondary | ICD-10-CM | POA: Insufficient documentation

## 2023-09-28 LAB — CBC WITH DIFFERENTIAL (CANCER CENTER ONLY)
Abs Immature Granulocytes: 0.07 10*3/uL (ref 0.00–0.07)
Basophils Absolute: 0 10*3/uL (ref 0.0–0.1)
Basophils Relative: 1 %
Eosinophils Absolute: 0.6 10*3/uL — ABNORMAL HIGH (ref 0.0–0.5)
Eosinophils Relative: 9 %
HCT: 35.3 % — ABNORMAL LOW (ref 39.0–52.0)
Hemoglobin: 11 g/dL — ABNORMAL LOW (ref 13.0–17.0)
Immature Granulocytes: 1 %
Lymphocytes Relative: 16 %
Lymphs Abs: 1 10*3/uL (ref 0.7–4.0)
MCH: 30.1 pg (ref 26.0–34.0)
MCHC: 31.2 g/dL (ref 30.0–36.0)
MCV: 96.7 fL (ref 80.0–100.0)
Monocytes Absolute: 0.5 10*3/uL (ref 0.1–1.0)
Monocytes Relative: 8 %
Neutro Abs: 4.4 10*3/uL (ref 1.7–7.7)
Neutrophils Relative %: 65 %
Platelet Count: 119 10*3/uL — ABNORMAL LOW (ref 150–400)
RBC: 3.65 MIL/uL — ABNORMAL LOW (ref 4.22–5.81)
RDW: 13.8 % (ref 11.5–15.5)
WBC Count: 6.6 10*3/uL (ref 4.0–10.5)
nRBC: 0 % (ref 0.0–0.2)

## 2023-09-28 LAB — IRON AND TIBC
Iron: 66 ug/dL (ref 45–182)
Saturation Ratios: 20 % (ref 17.9–39.5)
TIBC: 329 ug/dL (ref 250–450)
UIBC: 263 ug/dL

## 2023-09-28 LAB — RETIC PANEL
Immature Retic Fract: 9.5 % (ref 2.3–15.9)
RBC.: 3.67 MIL/uL — ABNORMAL LOW (ref 4.22–5.81)
Retic Count, Absolute: 61.3 10*3/uL (ref 19.0–186.0)
Retic Ct Pct: 1.7 % (ref 0.4–3.1)
Reticulocyte Hemoglobin: 31.6 pg (ref >27.9)

## 2023-09-28 LAB — FERRITIN: Ferritin: 46 ng/mL (ref 24–336)

## 2023-10-02 ENCOUNTER — Inpatient Hospital Stay

## 2023-10-02 ENCOUNTER — Encounter: Payer: Self-pay | Admitting: Oncology

## 2023-10-02 ENCOUNTER — Inpatient Hospital Stay (HOSPITAL_BASED_OUTPATIENT_CLINIC_OR_DEPARTMENT_OTHER): Admitting: Oncology

## 2023-10-02 VITALS — BP 124/52 | HR 66 | Temp 97.0°F | Resp 18 | Wt 261.4 lb

## 2023-10-02 VITALS — BP 116/54 | HR 61 | Resp 18

## 2023-10-02 DIAGNOSIS — N1831 Chronic kidney disease, stage 3a: Secondary | ICD-10-CM | POA: Diagnosis not present

## 2023-10-02 DIAGNOSIS — I129 Hypertensive chronic kidney disease with stage 1 through stage 4 chronic kidney disease, or unspecified chronic kidney disease: Secondary | ICD-10-CM | POA: Diagnosis not present

## 2023-10-02 DIAGNOSIS — D696 Thrombocytopenia, unspecified: Secondary | ICD-10-CM

## 2023-10-02 DIAGNOSIS — D631 Anemia in chronic kidney disease: Secondary | ICD-10-CM | POA: Diagnosis not present

## 2023-10-02 MED ORDER — IRON SUCROSE 20 MG/ML IV SOLN
200.0000 mg | Freq: Once | INTRAVENOUS | Status: AC
  Administered 2023-10-02: 200 mg via INTRAVENOUS
  Filled 2023-10-02: qty 10

## 2023-10-02 MED ORDER — EPOETIN ALFA-EPBX 20000 UNIT/ML IJ SOLN: 20000.0000 [IU] | Freq: Once | INTRAMUSCULAR | Status: DC

## 2023-10-02 NOTE — Assessment & Plan Note (Addendum)
 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 Rule out other etiologies.  Lab Results  Component Value Date   HGB 11.0 (L) 09/28/2023   TIBC 329 09/28/2023   IRONPCTSAT 20 09/28/2023   FERRITIN 46 09/28/2023    Hemoglobin and iron  panel have both improved.  Recommend Venofer  200mg  x 1 to further improve his iron  store.  After that he may continue iron  supplementation as maintenance.

## 2023-10-02 NOTE — Assessment & Plan Note (Addendum)
 Previous work up showed normal LDH, negative flowcytometry, normal, normal B12, normal folate, negative HIV negative. hepatitis. Slightly elevated light chain ratio, non specific.  Platelet count has been stable.  Monitor.

## 2023-10-02 NOTE — Progress Notes (Signed)
 Hematology/Oncology Progress note [phone] [phone]           REFERRING PROVIDER: Monique Ano, MD   CHIEF COMPLAINTS/REASON FOR VISIT:  Anemia due to CKD, and thrombocytopenia   ASSESSMENT & PLAN:   Anemia in stage 3a chronic kidney disease (HCC) Likely anemia due to CKD. Rule out other etiologies.  Lab Results  Component Value Date   HGB 11.0 (L) 09/28/2023   TIBC 329 09/28/2023   IRONPCTSAT 20 09/28/2023   FERRITIN 46 09/28/2023    Hemoglobin and iron  panel have both improved.  Recommend Venofer  200mg  x 1 to further improve his iron  store.  After that he may continue iron  supplementation as maintenance.    Thrombocytopenia (HCC) Previous work up showed normal LDH, negative flowcytometry, normal, normal B12, normal folate, negative HIV negative. hepatitis. Slightly elevated light chain ratio, non specific.  Platelet count has been stable.  Monitor.   Orders Placed This Encounter  Procedures   CBC with Differential (Cancer Center Only)    Standing Status:   Future    Expected Date:   02/01/2024    Expiration Date:   10/01/2024   Iron  and TIBC    Standing Status:   Future    Expected Date:   02/01/2024    Expiration Date:   10/01/2024   Ferritin    Standing Status:   Future    Expected Date:   02/01/2024    Expiration Date:   10/01/2024   Retic Panel    Standing Status:   Future    Expected Date:   02/01/2024    Expiration Date:   10/01/2024   Follow up 4 months.   All questions were answered. The patient knows to call the clinic with any problems, questions or concerns.  Brian Forbes, MD, PhD [facility] Hematology Oncology 10/02/2023   HISTORY OF PRESENTING ILLNESS:   Brian Kramer is a  78 y.o.  male with PMH listed below was seen in consultation at the request of  Monique Ano, MD  for evaluation of anemia.   He was hospitalized for pneumonia and acute on chronic respiratory failure,  He has chronic respiratory  failure, has been on oxygen for a couple of years. A CT scan during his hospital stay showed some lymph node enlargement, which may be reactive to pneumonia.  He is experiencing anemia with low hemoglobin and platelet counts, which have been dropping recently. No chest pain, shortness of breath, or palpitations.he received a dose of Venofer  on 08/01/2023 and tolerated well.  He has chronic kidney disease stage 3A, which is stable and not worsening.   He is currently taking an iron  supplement labeled as 65 mg, which is equivalent to 325 mg of the whole compound. He also receives iron  from a multivitamin.   He served in [employer] for twenty years, during which he drank and smoked. He currently drinks alcohol occasionally, about one mixed drink a week.   INTERVAL HISTORY Brian Kramer is a 78 y.o. male who has above history reviewed by me today presents for follow up visit for anemia and thrombocytopenia.  Patient tolerated IV venofer .  He feels fatigue has improved. No new complaints.   MEDICAL HISTORY:  Past Medical History:  Diagnosis Date   Anemia    Anginal pain (HCC)    Anxiety    Arthritis    Bilateral renal cysts    CKD (chronic kidney disease), stage III (HCC)    COPD (chronic obstructive pulmonary  disease) (HCC)    Coronary artery disease    a.) MI 03/1995 - no PCI; b.) LHC/PCI 1997 - stent (unknown type) x 1 to RCA; c.) LHC/PCI 2000 - stent (unknown type) x 1 to LCx; d.) LHC 04/02/2010 - insig CAD with widely patent stents; medical mgmt; e.) LHC 04/30/2015 - patent stents; insig CAD; medical mgmt; f.) R/LHC 05/01/20: EF 60%, norm cors.   Depression    Diastolic dysfunction 09/05/2021   a.)  TTE 09/05/2021: EF 50%, mild BAE, trivial PR, mild MR/TR; pHTN (RVSP 50 mmHg), G1DD.   Essential hypertension 04/08/2021   GERD (gastroesophageal reflux disease)    a.) improved s/p sleeve gastrectomy; no daily Tx   History of hiatal hernia 2004   a.) s/p repair   History of  obstructive sleep apnea    a.) resolved s/p sleeve gastrectomy; no longer requires nocturnal PAP therapy   HLD (hyperlipidemia)    Incomplete right bundle branch block (RBBB)    Insomnia    Lewy body dementia (HCC)    Long term current use of aspirin     Loosening of knee joint prosthesis (HCC)    Migraines    Myocardial infarction (HCC) 03/1995   a.) full details unknown; LHC performed, however no PCI performed.   On supplemental oxygen by nasal cannula    a.) 2L/[place] PRN   PAD (peripheral artery disease) (HCC) 04/08/2021   Peripheral neuropathy    Pneumonia    Presbycusis    PUD (peptic ulcer disease)    Pulmonary hypertension (HCC)    a.) takes ambrisentan ; b.)  R/LHC 05/01/2020: EF 60%, mean PA 30, mean PCWP 16, RVEDP 19, Ao sat 95, PA sat 74, CO 8.2, PVR 137, SVR 736; c.)  TTE 09/05/2021: EF 50%, RVSP 50 mmHg.   RLS (restless legs syndrome)    a.) takes ropinirole    SOB (shortness of breath) 06/28/2022   Thrombocytopenia (HCC)    Tremor    Type 2 diabetes, diet controlled (HCC)     SURGICAL HISTORY: Past Surgical History:  Procedure Laterality Date   AMPUTATION TOE Right 12/20/2021   Procedure: AMPUTATION TOE - SECOND AND THIRD - 28825;  Surgeon: Angel Barba, DPM;  Location: [facility];  Service: Podiatry;  Laterality: Right;   CARPOMETACARPAL ([facility]) FUSION OF THUMB Right 04/08/2018   Procedure: CARPOMETACARPAL [facility]) FUSION OF THUMB;  Surgeon: Molli Angelucci, MD;  Location: [facility];  Service: Orthopedics;  Laterality: Right;   CARPOMETACARPAL ([facility]) FUSION OF THUMB Left 12/02/2018   Procedure: CARPOMETACARPAL ([facility]) FUSION OF  LEFT THUMB, DIABETIC;  Surgeon: Molli Angelucci, MD;  Location: [facility];  Service: Orthopedics;  Laterality: Left;   CATARACT EXTRACTION Bilateral 2015   COLONOSCOPY     COLONOSCOPY WITH PROPOFOL  N/A 05/19/2018   Procedure: COLONOSCOPY WITH PROPOFOL ;  Surgeon: Toledo, Alphonsus Jeans, MD;  Location: [facility];  Service: Gastroenterology;  Laterality: N/A;    CORONARY ANGIOPLASTY WITH STENT PLACEMENT Left 1997   Procedure: CORONARY ANGIOPLASTY WITH STENT PLACEMENT; stent x1 (unknown type) to RCA   CORONARY ANGIOPLASTY WITH STENT PLACEMENT Left 2000   Procedure: CORONARY ANGIOPLASTY WITH STENT PLACEMENT; stent x1 (unknown type) to LCx   EXCISION PARTIAL PHALANX Left 10/04/2021   Procedure: EXCISION PARTIAL PHALANX - 2, 3, 4;  Surgeon: Angel Barba, DPM;  Location: [facility];  Service: Podiatry;  Laterality: Left;   EYE SURGERY     cataract bilateral with lens   FLEXOR TENOTOMY  Right 12/20/2021   Procedure: FLEXOR TENOTOMY - FOURTH TOE -  40981;  Surgeon: Angel Barba, DPM;  Location: [facility];  Service: Podiatry;  Laterality: Right;   HAMMER TOE SURGERY Right 01/15/2018   Procedure: HAMMER TOE CORRECTION-2ND & 3RD;  Surgeon: Sharlyn Deaner, DPM;  Location: [facility];  Service: Podiatry;  Laterality: Right;   HEEL SPUR RESECTION  2012   INGUINAL HERNIA REPAIR Bilateral    KNEE ARTHROSCOPY Right 2005   KNEE ARTHROSCOPY Left 2006   LAPAROSCOPIC GASTRIC SLEEVE RESECTION N/A 03/17/2016   LEFT HEART CATH AND CORONARY ANGIOGRAPHY Left 04/02/2010   Procedure: LEFT HEART CATH AND CORONARY ANGIOGRAPHY; Location: [facility]; Surgeon: Bland Bunnell, MD   LEFT HEART CATH AND CORONARY ANGIOGRAPHY Left 04/30/2015   Procedure: LEFT HEART CATH AND CORONARY ANGIOGRAPHY; Location: [facility]; Surgeon: Johannah Mutters, MD   RIGHT AND LEFT HEART CATH Left 05/01/2020   Procedure: RIGHT AND LEFT HEART CATH;  Surgeon: Antonette Batters, MD;  Location: [facility];  Service: Cardiovascular;  Laterality: Left;   Right shoulder AC separation surgery in 1995 Right    Stem Cells to left heel  2017   TONSILLECTOMY     TOTAL KNEE ARTHROPLASTY Left 2007   TOTAL KNEE ARTHROPLASTY Right 2015   TOTAL KNEE REVISION Left 04/06/2023   Procedure: Left total knee revision, both components;  Surgeon: Venus Ginsberg, MD;  Location: [facility];  Service: Orthopedics;  Laterality: Left;   ULNAR  TUNNEL RELEASE Left 04/14/2019   Procedure: LEFT CUBITAL TUNNEL RELEASE;  Surgeon: Molli Angelucci, MD;  Location: [facility];  Service: Orthopedics;  Laterality: Left;   UPPER GI ENDOSCOPY  2017   VASECTOMY     1970s   WEIL OSTEOTOMY Right 01/15/2018   Procedure: WEIL OSTEOTOMY-2ND & 3RD;  Surgeon: Sharlyn Deaner, DPM;  Location: [facility];  Service: Podiatry;  Laterality: Right;    SOCIAL HISTORY: Social History   Socioeconomic History   Marital status: Married    Spouse name: phyllis   Number of children: Not on file   Years of education: Not on file   Highest education level: Not on file  Occupational History   Occupation: Paediatric nurse    Comment: retired  Tobacco Use   Smoking status: Former    Current packs/day: 0.00    Types: Cigarettes    Quit date: 03/09/1994    Years since quitting: 29.5   Smokeless tobacco: Never  Vaping Use   Vaping status: Never Used  Substance and Sexual Activity   Alcohol use: Yes    Alcohol/week: 1.0 - 2.0 standard drink of alcohol    Types: 1 - 2 Standard drinks or equivalent per week    Comment: 1-2 per week   Drug use: Never   Sexual activity: Not Currently    Birth control/protection: None  Other Topics Concern   Not on file  Social History Narrative   Not on file   Social Drivers of Health   Financial Resource Strain: Low Risk  (08/04/2023)   Received from [facility]   Overall Financial Resource Strain (CARDIA)    Difficulty of Paying Living Expenses: Not hard at all  Food Insecurity: No Food Insecurity (08/17/2023)   Hunger Vital Sign    Worried About Running Out of Food in the Last Year: Never true    Ran Out of Food in the Last Year: Never true  Transportation Needs: No Transportation Needs (08/17/2023)   PRAPARE - Administrator, Civil Service (Medical): No    Lack of Transportation (Non-Medical):  No  Physical Activity: Not on file  Stress: Not on file  Social Connections: Socially  Isolated (07/31/2023)   Social Connection and Isolation Panel [NHANES]    Frequency of Communication with Friends and Family: Never    Frequency of Social Gatherings with Friends and Family: Twice a week    Attends Religious Services: Never    Database administrator or Organizations: No    Attends Banker Meetings: Never    Marital Status: Married  Catering manager Violence: Not At Risk (08/17/2023)   Humiliation, Afraid, Rape, and Kick questionnaire    Fear of Current or Ex-Partner: No    Emotionally Abused: No    Physically Abused: No    Sexually Abused: No    FAMILY HISTORY: Family History  Problem Relation Age of Onset   Hypertension Mother    Hypertension Father    Stroke Father    Hypertension Maternal Grandmother     ALLERGIES:  is allergic to tape, penicillins, and shellfish allergy.  MEDICATIONS:  Current Outpatient Medications  Medication Sig Dispense Refill   albuterol  (VENTOLIN  HFA) 108 (90 Base) MCG/ACT inhaler Inhale 2 puffs into the lungs every 6 (six) hours as needed for wheezing or shortness of breath.     ambrisentan  (LETAIRIS ) 5 MG tablet Take 5 mg by mouth daily.     aspirin  EC 81 MG tablet Take 81 mg by mouth at bedtime.      azelastine  (ASTELIN ) 0.1 % nasal spray Place 1 spray into both nostrils 2 (two) times daily as needed for rhinitis or allergies. Use in each nostril as directed     Calcium  500-100 MG-UNIT CHEW Chew 500 mg by mouth 2 (two) times daily. Calcium  is bariatric chewable with vitamin D included     ferrous sulfate  325 (65 FE) MG tablet Take 325 mg by mouth daily with breakfast.     Fluticasone -Umeclidin-Vilant (TRELEGY ELLIPTA) 100-62.5-25 MCG/ACT AEPB Inhale 1 puff into the lungs daily.     ipratropium (ATROVENT ) 0.03 % nasal spray Place 2 sprays into both nostrils 3 (three) times daily as needed for rhinitis.      LOKELMA  5 g packet Take by mouth.     LYRICA  200 MG capsule Take 200 mg by mouth 3 (three) times daily.       mometasone (ELOCON) 0.1 % lotion 1-4 drops See admin instructions. 1-4 drops in each ear as needed for itching     Multiple Vitamins-Minerals (BARIATRIC MULTIVITAMINS/IRON  PO) Take 1 tablet by mouth daily.     nitroGLYCERIN (NITROSTAT) 0.4 MG SL tablet Place 0.4 mg under the tongue every 5 (five) minutes as needed for chest pain.      OXYGEN Inhale 2 L into the lungs at bedtime.     polyethylene glycol (MIRALAX  / GLYCOLAX ) 17 g packet Take by mouth.     QUEtiapine  (SEROQUEL ) 25 MG tablet Take 25 mg by mouth at bedtime.     rOPINIRole  (REQUIP  XL) 4 MG 24 hr tablet Take 4 mg by mouth at bedtime.     rosuvastatin  (CRESTOR ) 5 MG tablet Take 5 mg by mouth every evening.     tamsulosin  (FLOMAX ) 0.4 MG CAPS capsule Take 1 capsule (0.4 mg total) by mouth daily. 30 capsule 11   clobetasol  ointment (TEMOVATE ) 0.05 % Apply 1 Application topically 2 (two) times daily. (Patient not taking: Reported on 09/10/2023)     No current facility-administered medications for this visit.    Review of Systems  Constitutional:  Positive  for fever. Negative for appetite change, chills and unexpected weight change.  HENT:   Negative for hearing loss and voice change.   Eyes:  Negative for eye problems and icterus.  Respiratory:  Positive for shortness of breath. Negative for chest tightness and cough.   Cardiovascular:  Negative for chest pain and leg swelling.  Gastrointestinal:  Negative for abdominal distention, abdominal pain, blood in stool, nausea and vomiting.  Endocrine: Negative for hot flashes.  Genitourinary:  Negative for difficulty urinating, dysuria and frequency.   Musculoskeletal:  Negative for arthralgias.  Skin:  Negative for itching and rash.  Neurological:  Negative for light-headedness and numbness.  Hematological:  Negative for adenopathy. Does not bruise/bleed easily.  Psychiatric/Behavioral:  Negative for confusion.    PHYSICAL EXAMINATION: ECOG PERFORMANCE STATUS: 1 - Symptomatic but  completely ambulatory Vitals:   10/02/23 1037  BP: (!) 124/52  Pulse: 66  Resp: 18  Temp: (!) 97 F (36.1 C)  SpO2: 96%   Filed Weights   10/02/23 1037  Weight: 261 lb 6.4 oz (118.6 kg)    Physical Exam Constitutional:      General: He is not in acute distress.    Appearance: He is obese.  HENT:     Head: Normocephalic and atraumatic.  Eyes:     General: No scleral icterus. Cardiovascular:     Rate and Rhythm: Normal rate and regular rhythm.     Heart sounds: Normal heart sounds.  Pulmonary:     Effort: Pulmonary effort is normal. No respiratory distress.     Comments: Decreased breath sounds bilaterally  Abdominal:     General: Bowel sounds are normal. There is no distension.     Palpations: Abdomen is soft.  Musculoskeletal:        General: No deformity. Normal range of motion.     Cervical back: Normal range of motion and neck supple.  Skin:    General: Skin is warm and dry.     Findings: No erythema or rash.  Neurological:     Mental Status: He is alert and oriented to person, place, and time. Mental status is at baseline.     Cranial Nerves: No cranial nerve deficit.     Coordination: Coordination normal.  Psychiatric:        Mood and Affect: Mood normal.     LABORATORY DATA:  I have reviewed the data as listed    Latest Ref Rng & Units 09/28/2023    9:49 AM 08/17/2023   10:13 AM 08/01/2023    4:50 AM  CBC  WBC 4.0 - 10.5 K/uL 6.6  5.6  8.0   Hemoglobin 13.0 - 17.0 g/dL 16.1  9.8  8.1   Hematocrit 39.0 - 52.0 % 35.3  31.6  25.2   Platelets 150 - 400 K/uL 119  134  115       Latest Ref Rng & Units 08/01/2023    4:50 AM 07/31/2023    2:35 AM 07/30/2023    6:36 AM  CMP  Glucose 70 - 99 mg/dL 096  045  409   BUN 8 - 23 mg/dL 47  52  43   Creatinine 0.61 - 1.24 mg/dL 8.11  9.14  7.82   Sodium 135 - 145 mmol/L 139  138  141   Potassium 3.5 - 5.1 mmol/L 5.1  5.3  4.9   Chloride 98 - 111 mmol/L 109  109  108   CO2 22 - 32 mmol/L 25  24  23   Calcium  8.9  - 10.3 mg/dL 8.4  8.2  8.9   Total Protein 6.5 - 8.1 g/dL   7.4   Total Bilirubin 0.0 - 1.2 mg/dL   0.6   Alkaline Phos 38 - 126 U/L   70   AST 15 - 41 U/L   27   ALT 0 - 44 U/L   16       RADIOGRAPHIC STUDIES: I have personally reviewed the radiological images as listed and agreed with the findings in the report. CT CHEST WO CONTRAST Result Date: 07/30/2023 CLINICAL DATA:  Pneumonia EXAM: CT CHEST WITHOUT CONTRAST TECHNIQUE: Multidetector CT imaging of the chest was performed following the standard protocol without IV contrast. RADIATION DOSE REDUCTION: This exam was performed according to the departmental dose-optimization program which includes automated exposure control, adjustment of the mA and/or kV according to patient size and/or use of iterative reconstruction technique. COMPARISON:  Chest radiograph 07/29/1998 FINDINGS: Cardiovascular: Coronary artery calcification and aortic atherosclerotic calcification. Mediastinum/Nodes: Enlarged mediastinal and hilar lymph nodes. Example RIGHT lower paratracheal node measures 21 mm (image 60). RIGHT hilar lymph node measures 26 mm on image 72. Subcarinal node measures 18 mm. No supraclavicular adenopathy. Lungs/Pleura: Dense consolidation in the RIGHT lower lobe air bronchograms. Nodular airspace densities in the RIGHT middle lobe. Mild atelectasis in the LEFT lower lobe. Upper Abdomen: Limited view of the liver, kidneys, pancreas are unremarkable. Normal adrenal glands. Musculoskeletal: No acute osseous abnormality. IMPRESSION: 1. Findings most consistent with multifocal pneumonia the RIGHT lung. Dense consolidation in the RIGHT lower lobe. 2. Bulky mediastinal adenopathy. Differential includes reactive adenopathy related to pneumonia versus adenopathy related to lymphoma or metastatic adenopathy. The lymph nodes are larger than typical reactive adenopathy. Recommend clinical correlation and consider FDG PET scan. Electronically Signed   By: Deboraha Fallow M.D.   On: 07/30/2023 09:48   DG Chest Port 1 View Result Date: 07/30/2023 CLINICAL DATA:  Chest pressure on the left side.  Chest pain. EXAM: PORTABLE CHEST 1 VIEW COMPARISON:  04/07/2023 FINDINGS: Low volume film. The cardio pericardial silhouette is enlarged. Vascular congestion with diffuse interstitial opacity is progressive in the interval. There is new right base airspace disease with possible small right pleural effusion. No acute bony abnormality. Telemetry leads overlie the chest. IMPRESSION: 1. Low volume film with progressive vascular congestion and diffuse interstitial opacity. Imaging features suggest edema. 2. New right base airspace disease compatible with pneumonia and possible small right pleural effusion. Electronically Signed   By: Donnal Fusi M.D.   On: 07/30/2023 07:16

## 2023-11-20 ENCOUNTER — Other Ambulatory Visit: Payer: Self-pay | Admitting: Specialist

## 2023-11-20 DIAGNOSIS — R590 Localized enlarged lymph nodes: Secondary | ICD-10-CM

## 2023-12-16 ENCOUNTER — Encounter: Payer: Self-pay | Admitting: Oncology

## 2023-12-18 ENCOUNTER — Encounter: Payer: Self-pay | Admitting: Oncology

## 2023-12-23 ENCOUNTER — Ambulatory Visit
Admission: RE | Admit: 2023-12-23 | Discharge: 2023-12-23 | Disposition: A | Discharge status: Home / Self Care | Source: Ambulatory Visit | Attending: Specialist | Admitting: Specialist

## 2023-12-23 DIAGNOSIS — R590 Localized enlarged lymph nodes: Secondary | ICD-10-CM | POA: Insufficient documentation

## 2024-01-13 ENCOUNTER — Ambulatory Visit: Attending: Internal Medicine

## 2024-01-13 DIAGNOSIS — G4734 Idiopathic sleep related nonobstructive alveolar hypoventilation: Secondary | ICD-10-CM | POA: Insufficient documentation

## 2024-01-13 DIAGNOSIS — G4733 Obstructive sleep apnea (adult) (pediatric): Secondary | ICD-10-CM | POA: Diagnosis present

## 2024-02-01 ENCOUNTER — Inpatient Hospital Stay: Attending: Oncology

## 2024-02-01 DIAGNOSIS — Z9981 Dependence on supplemental oxygen: Secondary | ICD-10-CM | POA: Diagnosis not present

## 2024-02-01 DIAGNOSIS — N1831 Chronic kidney disease, stage 3a: Secondary | ICD-10-CM | POA: Diagnosis not present

## 2024-02-01 DIAGNOSIS — D509 Iron deficiency anemia, unspecified: Secondary | ICD-10-CM | POA: Diagnosis present

## 2024-02-01 DIAGNOSIS — Z87891 Personal history of nicotine dependence: Secondary | ICD-10-CM | POA: Insufficient documentation

## 2024-02-01 DIAGNOSIS — D696 Thrombocytopenia, unspecified: Secondary | ICD-10-CM | POA: Insufficient documentation

## 2024-02-01 DIAGNOSIS — J9610 Chronic respiratory failure, unspecified whether with hypoxia or hypercapnia: Secondary | ICD-10-CM | POA: Diagnosis not present

## 2024-02-01 LAB — CBC WITH DIFFERENTIAL (CANCER CENTER ONLY)
Abs Immature Granulocytes: 0.02 K/uL (ref 0.00–0.07)
Basophils Absolute: 0 K/uL (ref 0.0–0.1)
Basophils Relative: 0 %
Eosinophils Absolute: 0.5 K/uL (ref 0.0–0.5)
Eosinophils Relative: 10 %
HCT: 31.1 % — ABNORMAL LOW (ref 39.0–52.0)
Hemoglobin: 9.7 g/dL — ABNORMAL LOW (ref 13.0–17.0)
Immature Granulocytes: 0 %
Lymphocytes Relative: 16 %
Lymphs Abs: 0.8 K/uL (ref 0.7–4.0)
MCH: 29.5 pg (ref 26.0–34.0)
MCHC: 31.2 g/dL (ref 30.0–36.0)
MCV: 94.5 fL (ref 80.0–100.0)
Monocytes Absolute: 0.5 K/uL (ref 0.1–1.0)
Monocytes Relative: 9 %
Neutro Abs: 3.3 K/uL (ref 1.7–7.7)
Neutrophils Relative %: 65 %
Platelet Count: 123 K/uL — ABNORMAL LOW (ref 150–400)
RBC: 3.29 MIL/uL — ABNORMAL LOW (ref 4.22–5.81)
RDW: 14.9 % (ref 11.5–15.5)
WBC Count: 5.1 K/uL (ref 4.0–10.5)
nRBC: 0 % (ref 0.0–0.2)

## 2024-02-01 LAB — IRON AND TIBC
Iron: 40 ug/dL — ABNORMAL LOW (ref 45–182)
Saturation Ratios: 11 % — ABNORMAL LOW (ref 17.9–39.5)
TIBC: 375 ug/dL (ref 250–450)
UIBC: 335 ug/dL

## 2024-02-01 LAB — RETIC PANEL
Immature Retic Fract: 12.3 % (ref 2.3–15.9)
RBC.: 3.29 MIL/uL — ABNORMAL LOW (ref 4.22–5.81)
Retic Count, Absolute: 79 K/uL (ref 19.0–186.0)
Retic Ct Pct: 2.4 % (ref 0.4–3.1)
Reticulocyte Hemoglobin: 31.2 pg (ref >27.9)

## 2024-02-01 LAB — FERRITIN: Ferritin: 16 ng/mL — ABNORMAL LOW (ref 24–336)

## 2024-02-05 ENCOUNTER — Inpatient Hospital Stay

## 2024-02-05 ENCOUNTER — Inpatient Hospital Stay: Admitting: Oncology

## 2024-02-05 ENCOUNTER — Encounter: Payer: Self-pay | Admitting: Oncology

## 2024-02-05 VITALS — BP 116/55 | HR 67 | Temp 97.6°F | Resp 16 | Wt 262.9 lb

## 2024-02-05 VITALS — BP 109/52 | HR 58 | Temp 98.0°F | Resp 18

## 2024-02-05 DIAGNOSIS — N1831 Chronic kidney disease, stage 3a: Secondary | ICD-10-CM | POA: Diagnosis not present

## 2024-02-05 DIAGNOSIS — D509 Iron deficiency anemia, unspecified: Secondary | ICD-10-CM | POA: Diagnosis not present

## 2024-02-05 DIAGNOSIS — D631 Anemia in chronic kidney disease: Secondary | ICD-10-CM

## 2024-02-05 DIAGNOSIS — D696 Thrombocytopenia, unspecified: Secondary | ICD-10-CM | POA: Diagnosis not present

## 2024-02-05 MED ORDER — IRON SUCROSE 20 MG/ML IV SOLN
200.0000 mg | Freq: Once | INTRAVENOUS | Status: AC
  Administered 2024-02-05: 200 mg via INTRAVENOUS
  Filled 2024-02-05: qty 10

## 2024-02-05 MED ORDER — SODIUM CHLORIDE 0.9% FLUSH
10.0000 mL | Freq: Once | INTRAVENOUS | Status: AC | PRN
  Administered 2024-02-05: 10 mL
  Filled 2024-02-05: qty 10

## 2024-02-05 NOTE — Assessment & Plan Note (Signed)
 Previous work up showed normal LDH, negative flowcytometry, normal, normal B12, normal folate, negative HIV negative. hepatitis. Slightly elevated light chain ratio, non specific.  Platelet count has been stable.  Monitor.

## 2024-02-05 NOTE — Progress Notes (Signed)
 Hematology/Oncology Progress note [phone] [phone]           REFERRING PROVIDER: Alla Amis, MD   CHIEF COMPLAINTS/REASON FOR VISIT:  Anemia  and thrombocytopenia   ASSESSMENT & PLAN:   Iron  deficiency anemia Lab Results  Component Value Date   HGB 9.7 (L) 02/01/2024   TIBC 375 02/01/2024   IRONPCTSAT 11 (L) 02/01/2024   FERRITIN 16 (L) 02/01/2024    Recommend Venofer  200mg  weekly x 4.  After that he may continue iron  supplementation as maintenance.  Suspect that she has blood loss from GI tract.  Refer to GI for further evaluation.    Thrombocytopenia (HCC) Previous work up showed normal LDH, negative flowcytometry, normal, normal B12, normal folate, negative HIV negative. hepatitis. Slightly elevated light chain ratio, non specific.  Platelet count has been stable.  Monitor.   Orders Placed This Encounter  Procedures   Ferritin    Standing Status:   Future    Expected Date:   05/07/2024    Expiration Date:   08/05/2024   Iron  and TIBC    Standing Status:   Future    Expected Date:   05/07/2024    Expiration Date:   08/05/2024   Retic Panel    Standing Status:   Future    Expected Date:   05/07/2024    Expiration Date:   08/05/2024   CBC with Differential (Cancer Center Only)    Standing Status:   Future    Expected Date:   05/07/2024    Expiration Date:   08/05/2024   Follow up 3 months.   All questions were answered. The patient knows to call the clinic with any problems, questions or concerns.  Zelphia Cap, MD, PhD [facility] Hematology Oncology 02/05/2024   HISTORY OF PRESENTING ILLNESS:   Brian Kramer is a  78 y.o.  male with PMH listed below was seen in consultation at the request of  Alla Amis, MD  for evaluation of anemia.   He was hospitalized for pneumonia and acute on chronic respiratory failure,  He has chronic respiratory failure, has been on oxygen for a couple of years. A CT scan during his  hospital stay showed some lymph node enlargement, which may be reactive to pneumonia.  He is experiencing anemia with low hemoglobin and platelet counts, which have been dropping recently. No chest pain, shortness of breath, or palpitations.he received a dose of Venofer  on 08/01/2023 and tolerated well.  He has chronic kidney disease stage 3A, which is stable and not worsening.   He is currently taking an iron  supplement labeled as 65 mg, which is equivalent to 325 mg of the whole compound. He also receives iron  from a multivitamin.   He served in [employer] for twenty years, during which he drank and smoked. He currently drinks alcohol occasionally, about one mixed drink a week.   INTERVAL HISTORY Garett Dyllen Menning is a 78 y.o. male who has above history reviewed by me today presents for follow up visit for anemia and thrombocytopenia.  Patient tolerated IV venofer . He takes oral iron  supplementation. Sometimes he notices dark stool. No new complaints.   MEDICAL HISTORY:  Past Medical History:  Diagnosis Date   Anemia    Anginal pain (HCC)    Anxiety    Arthritis    Bilateral renal cysts    CKD (chronic kidney disease), stage III (HCC)    COPD (chronic obstructive pulmonary disease) (HCC)    Coronary artery  disease    a.) MI 03/1995 - no PCI; b.) LHC/PCI 1997 - stent (unknown type) x 1 to RCA; c.) LHC/PCI 2000 - stent (unknown type) x 1 to LCx; d.) LHC 04/02/2010 - insig CAD with widely patent stents; medical mgmt; e.) LHC 04/30/2015 - patent stents; insig CAD; medical mgmt; f.) R/LHC 05/01/20: EF 60%, norm cors.   Depression    Diastolic dysfunction 09/05/2021   a.)  TTE 09/05/2021: EF 50%, mild BAE, trivial PR, mild MR/TR; pHTN (RVSP 50 mmHg), G1DD.   Essential hypertension 04/08/2021   GERD (gastroesophageal reflux disease)    a.) improved s/p sleeve gastrectomy; no daily Tx   History of hiatal hernia 2004   a.) s/p repair   History of obstructive sleep apnea    a.) resolved s/p  sleeve gastrectomy; no longer requires nocturnal PAP therapy   HLD (hyperlipidemia)    Incomplete right bundle branch block (RBBB)    Insomnia    Lewy body dementia (HCC)    Long term current use of aspirin     Loosening of knee joint prosthesis (HCC)    Migraines    Myocardial infarction (HCC) 03/1995   a.) full details unknown; LHC performed, however no PCI performed.   On supplemental oxygen by nasal cannula    a.) 2L/[place] PRN   PAD (peripheral artery disease) (HCC) 04/08/2021   Peripheral neuropathy    Pneumonia    Presbycusis    PUD (peptic ulcer disease)    Pulmonary hypertension (HCC)    a.) takes ambrisentan ; b.)  R/LHC 05/01/2020: EF 60%, mean PA 30, mean PCWP 16, RVEDP 19, Ao sat 95, PA sat 74, CO 8.2, PVR 137, SVR 736; c.)  TTE 09/05/2021: EF 50%, RVSP 50 mmHg.   RLS (restless legs syndrome)    a.) takes ropinirole    SOB (shortness of breath) 06/28/2022   Thrombocytopenia (HCC)    Tremor    Type 2 diabetes, diet controlled (HCC)     SURGICAL HISTORY: Past Surgical History:  Procedure Laterality Date   AMPUTATION TOE Right 12/20/2021   Procedure: AMPUTATION TOE - SECOND AND THIRD - 28825;  Surgeon: Neill Boas, DPM;  Location: [facility];  Service: Podiatry;  Laterality: Right;   CARPOMETACARPAL ([facility]) FUSION OF THUMB Right 04/08/2018   Procedure: CARPOMETACARPAL [facility]) FUSION OF THUMB;  Surgeon: Kathlynn Sharper, MD;  Location: [facility];  Service: Orthopedics;  Laterality: Right;   CARPOMETACARPAL ([facility]) FUSION OF THUMB Left 12/02/2018   Procedure: CARPOMETACARPAL ([facility]) FUSION OF  LEFT THUMB, DIABETIC;  Surgeon: Kathlynn Sharper, MD;  Location: [facility];  Service: Orthopedics;  Laterality: Left;   CATARACT EXTRACTION Bilateral 2015   COLONOSCOPY     COLONOSCOPY WITH PROPOFOL  N/A 05/19/2018   Procedure: COLONOSCOPY WITH PROPOFOL ;  Surgeon: Toledo, Ladell POUR, MD;  Location: [facility];  Service: Gastroenterology;  Laterality: N/A;   CORONARY ANGIOPLASTY WITH STENT PLACEMENT  Left 1997   Procedure: CORONARY ANGIOPLASTY WITH STENT PLACEMENT; stent x1 (unknown type) to RCA   CORONARY ANGIOPLASTY WITH STENT PLACEMENT Left 2000   Procedure: CORONARY ANGIOPLASTY WITH STENT PLACEMENT; stent x1 (unknown type) to LCx   EXCISION PARTIAL PHALANX Left 10/04/2021   Procedure: EXCISION PARTIAL PHALANX - 2, 3, 4;  Surgeon: Neill Boas, DPM;  Location: [facility];  Service: Podiatry;  Laterality: Left;   EYE SURGERY     cataract bilateral with lens   FLEXOR TENOTOMY  Right 12/20/2021   Procedure: FLEXOR TENOTOMY - FOURTH TOE - 71989;  Surgeon: Neill Boas, DPM;  Location: [facility];  Service: Podiatry;  Laterality: Right;   HAMMER TOE SURGERY Right 01/15/2018   Procedure: HAMMER TOE CORRECTION-2ND & 3RD;  Surgeon: Lilli Cough, DPM;  Location: [facility];  Service: Podiatry;  Laterality: Right;   HEEL SPUR RESECTION  2012   INGUINAL HERNIA REPAIR Bilateral    KNEE ARTHROSCOPY Right 2005   KNEE ARTHROSCOPY Left 2006   LAPAROSCOPIC GASTRIC SLEEVE RESECTION N/A 03/17/2016   LEFT HEART CATH AND CORONARY ANGIOGRAPHY Left 04/02/2010   Procedure: LEFT HEART CATH AND CORONARY ANGIOGRAPHY; Location: [facility]; Surgeon: Lynwood Sous, MD   LEFT HEART CATH AND CORONARY ANGIOGRAPHY Left 04/30/2015   Procedure: LEFT HEART CATH AND CORONARY ANGIOGRAPHY; Location: [facility]; Surgeon: FABIENE Jama Constable, MD   RIGHT AND LEFT HEART CATH Left 05/01/2020   Procedure: RIGHT AND LEFT HEART CATH;  Surgeon: Florencio Cara BIRCH, MD;  Location: [facility];  Service: Cardiovascular;  Laterality: Left;   Right shoulder AC separation surgery in 1995 Right    Stem Cells to left heel  2017   TONSILLECTOMY     TOTAL KNEE ARTHROPLASTY Left 2007   TOTAL KNEE ARTHROPLASTY Right 2015   TOTAL KNEE REVISION Left 04/06/2023   Procedure: Left total knee revision, both components;  Surgeon: Lorelle Hussar, MD;  Location: [facility];  Service: Orthopedics;  Laterality: Left;   ULNAR TUNNEL RELEASE Left 04/14/2019    Procedure: LEFT CUBITAL TUNNEL RELEASE;  Surgeon: Kathlynn Sharper, MD;  Location: [facility];  Service: Orthopedics;  Laterality: Left;   UPPER GI ENDOSCOPY  2017   VASECTOMY     1970s   WEIL OSTEOTOMY Right 01/15/2018   Procedure: WEIL OSTEOTOMY-2ND & 3RD;  Surgeon: Lilli Cough, DPM;  Location: [facility];  Service: Podiatry;  Laterality: Right;    SOCIAL HISTORY: Social History   Socioeconomic History   Marital status: Married    Spouse name: phyllis   Number of children: Not on file   Years of education: Not on file   Highest education level: Not on file  Occupational History   Occupation: Paediatric nurse    Comment: retired  Tobacco Use   Smoking status: Former    Current packs/day: 0.00    Types: Cigarettes    Quit date: 03/09/1994    Years since quitting: 29.9   Smokeless tobacco: Never  Vaping Use   Vaping status: Never Used  Substance and Sexual Activity   Alcohol use: Yes    Alcohol/week: 1.0 - 2.0 standard drink of alcohol    Types: 1 - 2 Standard drinks or equivalent per week    Comment: 1-2 per week   Drug use: Never   Sexual activity: Not Currently    Birth control/protection: None  Other Topics Concern   Not on file  Social History Narrative   Not on file   Social Drivers of Health   Financial Resource Strain: Low Risk  (08/04/2023)   Received from [facility]   Overall Financial Resource Strain (CARDIA)    Difficulty of Paying Living Expenses: Not hard at all  Food Insecurity: No Food Insecurity (08/17/2023)   Hunger Vital Sign    Worried About Running Out of Food in the Last Year: Never true    Ran Out of Food in the Last Year: Never true  Transportation Needs: No Transportation Needs (08/17/2023)   PRAPARE - Administrator, Civil Service (Medical): No    Lack of Transportation (Non-Medical): No  Physical Activity: Not on file  Stress: Not on file  Social Connections: Socially Isolated (07/31/2023)   Social  Connection and Isolation Panel    Frequency of Communication with Friends and Family: Never    Frequency of Social Gatherings with Friends and Family: Twice a week    Attends Religious Services: Never    Database administrator or Organizations: No    Attends Banker Meetings: Never    Marital Status: Married  Catering manager Violence: Not At Risk (08/17/2023)   Humiliation, Afraid, Rape, and Kick questionnaire    Fear of Current or Ex-Partner: No    Emotionally Abused: No    Physically Abused: No    Sexually Abused: No    FAMILY HISTORY: Family History  Problem Relation Age of Onset   Hypertension Mother    Hypertension Father    Stroke Father    Hypertension Maternal Grandmother     ALLERGIES:  is allergic to tape, penicillins, and shellfish allergy.  MEDICATIONS:  Current Outpatient Medications  Medication Sig Dispense Refill   albuterol  (VENTOLIN  HFA) 108 (90 Base) MCG/ACT inhaler Inhale 2 puffs into the lungs every 6 (six) hours as needed for wheezing or shortness of breath.     ambrisentan  (LETAIRIS ) 5 MG tablet Take 5 mg by mouth daily.     aspirin  EC 81 MG tablet Take 81 mg by mouth at bedtime.      azelastine  (ASTELIN ) 0.1 % nasal spray Place 1 spray into both nostrils 2 (two) times daily as needed for rhinitis or allergies. Use in each nostril as directed     Calcium  500-100 MG-UNIT CHEW Chew 500 mg by mouth 2 (two) times daily. Calcium  is bariatric chewable with vitamin D included     clobetasol  ointment (TEMOVATE ) 0.05 % Apply 1 Application topically 2 (two) times daily.     ferrous sulfate  325 (65 FE) MG tablet Take 325 mg by mouth daily with breakfast.     Fluticasone -Umeclidin-Vilant (TRELEGY ELLIPTA) 100-62.5-25 MCG/ACT AEPB Inhale 1 puff into the lungs daily.     ipratropium (ATROVENT ) 0.03 % nasal spray Place 2 sprays into both nostrils 3 (three) times daily as needed for rhinitis.      LOKELMA  5 g packet Take by mouth.     LYRICA  200 MG capsule  Take 200 mg by mouth 3 (three) times daily.      mometasone (ELOCON) 0.1 % lotion 1-4 drops See admin instructions. 1-4 drops in each ear as needed for itching     Multiple Vitamins-Minerals (BARIATRIC MULTIVITAMINS/IRON  PO) Take 1 tablet by mouth daily.     nitroGLYCERIN (NITROSTAT) 0.4 MG SL tablet Place 0.4 mg under the tongue every 5 (five) minutes as needed for chest pain.      OXYGEN Inhale 2 L into the lungs at bedtime.     polyethylene glycol (MIRALAX  / GLYCOLAX ) 17 g packet Take by mouth.     QUEtiapine  (SEROQUEL ) 25 MG tablet Take 25 mg by mouth at bedtime.     rOPINIRole  (REQUIP  XL) 4 MG 24 hr tablet Take 4 mg by mouth at bedtime.     rosuvastatin  (CRESTOR ) 5 MG tablet Take 5 mg by mouth every evening.     tamsulosin  (FLOMAX ) 0.4 MG CAPS capsule Take 1 capsule (0.4 mg total) by mouth daily. 30 capsule 11   No current facility-administered medications for this visit.    Review of Systems  Constitutional:  Positive for fever. Negative for appetite change, chills and unexpected weight change.  HENT:  Negative for hearing loss and voice change.   Eyes:  Negative for eye problems and icterus.  Respiratory:  Positive for shortness of breath. Negative for chest tightness and cough.   Cardiovascular:  Negative for chest pain and leg swelling.  Gastrointestinal:  Negative for abdominal distention, abdominal pain, blood in stool, nausea and vomiting.  Endocrine: Negative for hot flashes.  Genitourinary:  Negative for difficulty urinating, dysuria and frequency.   Musculoskeletal:  Negative for arthralgias.  Skin:  Negative for itching and rash.  Neurological:  Negative for light-headedness and numbness.  Hematological:  Negative for adenopathy. Does not bruise/bleed easily.  Psychiatric/Behavioral:  Negative for confusion.    PHYSICAL EXAMINATION: ECOG PERFORMANCE STATUS: 1 - Symptomatic but completely ambulatory Vitals:   02/05/24 1051  BP: (!) 116/55  Pulse: 67  Resp: 16   Temp: 97.6 F (36.4 C)  SpO2: 90%   Filed Weights   02/05/24 1051  Weight: 262 lb 14.4 oz (119.3 kg)    Physical Exam Constitutional:      General: He is not in acute distress.    Appearance: He is obese.  HENT:     Head: Normocephalic and atraumatic.  Eyes:     General: No scleral icterus. Cardiovascular:     Rate and Rhythm: Normal rate and regular rhythm.     Heart sounds: Normal heart sounds.  Pulmonary:     Effort: Pulmonary effort is normal. No respiratory distress.     Comments: Decreased breath sounds bilaterally  Abdominal:     General: Bowel sounds are normal. There is no distension.     Palpations: Abdomen is soft.  Musculoskeletal:        General: No deformity. Normal range of motion.     Cervical back: Normal range of motion and neck supple.  Skin:    General: Skin is warm and dry.     Findings: No erythema or rash.  Neurological:     Mental Status: He is alert and oriented to person, place, and time. Mental status is at baseline.     Cranial Nerves: No cranial nerve deficit.     Coordination: Coordination normal.  Psychiatric:        Mood and Affect: Mood normal.     LABORATORY DATA:  I have reviewed the data as listed    Latest Ref Rng & Units 02/01/2024    9:35 AM 09/28/2023    9:49 AM 08/17/2023   10:13 AM  CBC  WBC 4.0 - 10.5 K/uL 5.1  6.6  5.6   Hemoglobin 13.0 - 17.0 g/dL 9.7  88.9  9.8   Hematocrit 39.0 - 52.0 % 31.1  35.3  31.6   Platelets 150 - 400 K/uL 123  119  134       Latest Ref Rng & Units 08/01/2023    4:50 AM 07/31/2023    2:35 AM 07/30/2023    6:36 AM  CMP  Glucose 70 - 99 mg/dL 861  823  837   BUN 8 - 23 mg/dL 47  52  43   Creatinine 0.61 - 1.24 mg/dL 8.58  8.26  8.38   Sodium 135 - 145 mmol/L 139  138  141   Potassium 3.5 - 5.1 mmol/L 5.1  5.3  4.9   Chloride 98 - 111 mmol/L 109  109  108   CO2 22 - 32 mmol/L 25  24  23    Calcium  8.9 - 10.3 mg/dL 8.4  8.2  8.9   Total  Protein 6.5 - 8.1 g/dL   7.4   Total Bilirubin 0.0  - 1.2 mg/dL   0.6   Alkaline Phos 38 - 126 U/L   70   AST 15 - 41 U/L   27   ALT 0 - 44 U/L   16       RADIOGRAPHIC STUDIES: I have personally reviewed the radiological images as listed and agreed with the findings in the report. Sleep Study Documents Result Date: 01/20/2024 Ordered by an unspecified provider.  Sleep Study Documents Result Date: 01/19/2024 Ordered by an unspecified provider.  CT CHEST WO CONTRAST Result Date: 12/26/2023 CLINICAL DATA:  Persistent mediastinal adenopathy on a recent chest CT. History of pneumonitis. EXAM: CT CHEST WITHOUT CONTRAST TECHNIQUE: Multidetector CT imaging of the chest was performed following the standard protocol without IV contrast. RADIATION DOSE REDUCTION: This exam was performed according to the departmental dose-optimization program which includes automated exposure control, adjustment of the mA and/or kV according to patient size and/or use of iterative reconstruction technique. COMPARISON:  09/17/2023 and older studies. FINDINGS: Cardiovascular: Heart normal in size. Three-vessel coronary artery calcifications. No pericardial effusion. Normal caliber thoracic aorta. Stable aortic atherosclerotic calcifications. Prominent right and left pulmonary arteries, left 2.8 cm, right 2.9 cm. Mediastinum/Nodes: No neck base, mediastinal or hilar masses. Prominent to mildly enlarged mediastinal lymph nodes. Right paratracheal, ascus level node measures 1.6 cm in short axis. Subcarinal node measures 1.6 cm in short axis. Allowing for measurement technique differences, nodes are stable. Trachea and esophagus are unremarkable. Small hiatal hernia. Stable changes from prior gastric surgery. Lungs/Pleura: No lung consolidation or edema. Peripheral reticular type opacities are stable consistent with fibrosis. There are several small pulmonary nodules all measuring under 4 mm and all stable. A few or calcified consistent with healed granuloma. Centrilobular emphysema,  also unchanged. No pleural effusion or pneumothorax. Upper Abdomen: No acute findings. Previous gastric surgery. Right renal cysts. Aortic atherosclerosis. Musculoskeletal: No fracture or acute finding. No bone lesion. No chest wall mass. IMPRESSION: 1. No acute findings. 2. Mild mediastinal lymphadenopathy, stable compared to the prior exam. 3. No residual areas of lung consolidation to suggest pneumonia. Stable residual parenchymal fibrotic changes and emphysema. 4. Several small pulmonary nodules, all less than 4 mm, without change. Consider single follow-up chest CT without contrast in 1 year to document longer term stability. 5. Three-vessel coronary artery calcifications. Aortic Atherosclerosis (ICD10-I70.0) and Emphysema (ICD10-J43.9). Electronically Signed   By: Alm Parkins M.D.   On: 12/26/2023 20:58

## 2024-02-05 NOTE — Assessment & Plan Note (Addendum)
 Lab Results  Component Value Date   HGB 9.7 (L) 02/01/2024   TIBC 375 02/01/2024   IRONPCTSAT 11 (L) 02/01/2024   FERRITIN 16 (L) 02/01/2024    Recommend Venofer  200mg  weekly x 4.  After that he may continue iron  supplementation as maintenance.  Suspect that she has blood loss from GI tract.  Refer to GI for further evaluation.

## 2024-02-09 ENCOUNTER — Other Ambulatory Visit: Payer: Self-pay

## 2024-02-09 ENCOUNTER — Telehealth: Payer: Self-pay

## 2024-02-09 NOTE — Telephone Encounter (Signed)
 GI referral faxed to [facility]. Re:Iron  deficienty anemia. Fax confirmation received.

## 2024-02-10 ENCOUNTER — Inpatient Hospital Stay: Attending: Oncology

## 2024-02-10 VITALS — BP 112/48 | HR 75 | Temp 96.4°F | Resp 18

## 2024-02-10 DIAGNOSIS — D509 Iron deficiency anemia, unspecified: Secondary | ICD-10-CM | POA: Insufficient documentation

## 2024-02-10 DIAGNOSIS — Z79899 Other long term (current) drug therapy: Secondary | ICD-10-CM | POA: Diagnosis not present

## 2024-02-10 MED ORDER — IRON SUCROSE 20 MG/ML IV SOLN
200.0000 mg | Freq: Once | INTRAVENOUS | Status: AC
  Administered 2024-02-10: 200 mg via INTRAVENOUS
  Filled 2024-02-10: qty 10

## 2024-02-10 MED ORDER — SODIUM CHLORIDE 0.9% FLUSH
10.0000 mL | Freq: Once | INTRAVENOUS | Status: AC | PRN
  Administered 2024-02-10: 10 mL
  Filled 2024-02-10: qty 10

## 2024-02-17 ENCOUNTER — Inpatient Hospital Stay

## 2024-02-17 VITALS — BP 109/66 | HR 61 | Temp 95.0°F | Resp 18

## 2024-02-17 DIAGNOSIS — D509 Iron deficiency anemia, unspecified: Secondary | ICD-10-CM | POA: Diagnosis not present

## 2024-02-17 MED ORDER — IRON SUCROSE 20 MG/ML IV SOLN
200.0000 mg | Freq: Once | INTRAVENOUS | Status: AC
  Administered 2024-02-17: 200 mg via INTRAVENOUS
  Filled 2024-02-17: qty 10

## 2024-02-17 MED ORDER — SODIUM CHLORIDE 0.9% FLUSH
10.0000 mL | Freq: Once | INTRAVENOUS | Status: AC | PRN
  Administered 2024-02-17: 10 mL
  Filled 2024-02-17: qty 10

## 2024-02-24 ENCOUNTER — Inpatient Hospital Stay

## 2024-02-24 VITALS — BP 97/46 | HR 55 | Temp 98.4°F | Resp 16

## 2024-02-24 DIAGNOSIS — D509 Iron deficiency anemia, unspecified: Secondary | ICD-10-CM | POA: Diagnosis not present

## 2024-02-24 MED ORDER — IRON SUCROSE 20 MG/ML IV SOLN
200.0000 mg | Freq: Once | INTRAVENOUS | Status: AC
  Administered 2024-02-24: 200 mg via INTRAVENOUS
  Filled 2024-02-24: qty 10

## 2024-02-24 NOTE — Progress Notes (Signed)
 Declined post observation. Aware of risks. Stable at discharge.

## 2024-02-24 NOTE — Patient Instructions (Signed)

## 2024-02-29 NOTE — Telephone Encounter (Signed)
 Pt scheduled on 08/04/24

## 2024-03-02 ENCOUNTER — Other Ambulatory Visit: Payer: Self-pay

## 2024-03-02 ENCOUNTER — Emergency Department

## 2024-03-02 ENCOUNTER — Inpatient Hospital Stay
Admission: EM | Admit: 2024-03-02 | Discharge: 2024-03-04 | DRG: 871 | Disposition: A | Discharge status: Home Health | Attending: Internal Medicine | Admitting: Internal Medicine

## 2024-03-02 ENCOUNTER — Inpatient Hospital Stay
Admit: 2024-03-02 | Discharge: 2024-03-02 | Disposition: A | Discharge status: Home / Self Care | Attending: Student in an Organized Health Care Education/Training Program | Admitting: Student in an Organized Health Care Education/Training Program

## 2024-03-02 DIAGNOSIS — Z6841 Body Mass Index (BMI) 40.0 and over, adult: Secondary | ICD-10-CM | POA: Diagnosis not present

## 2024-03-02 DIAGNOSIS — Z96653 Presence of artificial knee joint, bilateral: Secondary | ICD-10-CM | POA: Diagnosis present

## 2024-03-02 DIAGNOSIS — F32A Depression, unspecified: Secondary | ICD-10-CM | POA: Diagnosis present

## 2024-03-02 DIAGNOSIS — Z79899 Other long term (current) drug therapy: Secondary | ICD-10-CM

## 2024-03-02 DIAGNOSIS — J9601 Acute respiratory failure with hypoxia: Principal | ICD-10-CM

## 2024-03-02 DIAGNOSIS — J189 Pneumonia, unspecified organism: Secondary | ICD-10-CM | POA: Diagnosis present

## 2024-03-02 DIAGNOSIS — F0283 Dementia in other diseases classified elsewhere, unspecified severity, with mood disturbance: Secondary | ICD-10-CM | POA: Diagnosis present

## 2024-03-02 DIAGNOSIS — A419 Sepsis, unspecified organism: Principal | ICD-10-CM | POA: Diagnosis present

## 2024-03-02 DIAGNOSIS — F0284 Dementia in other diseases classified elsewhere, unspecified severity, with anxiety: Secondary | ICD-10-CM | POA: Diagnosis present

## 2024-03-02 DIAGNOSIS — E8729 Other acidosis: Secondary | ICD-10-CM | POA: Diagnosis present

## 2024-03-02 DIAGNOSIS — I129 Hypertensive chronic kidney disease with stage 1 through stage 4 chronic kidney disease, or unspecified chronic kidney disease: Secondary | ICD-10-CM | POA: Diagnosis present

## 2024-03-02 DIAGNOSIS — J440 Chronic obstructive pulmonary disease with acute lower respiratory infection: Secondary | ICD-10-CM | POA: Diagnosis present

## 2024-03-02 DIAGNOSIS — D631 Anemia in chronic kidney disease: Secondary | ICD-10-CM | POA: Diagnosis present

## 2024-03-02 DIAGNOSIS — Z1152 Encounter for screening for COVID-19: Secondary | ICD-10-CM | POA: Diagnosis not present

## 2024-03-02 DIAGNOSIS — R6520 Severe sepsis without septic shock: Secondary | ICD-10-CM | POA: Diagnosis not present

## 2024-03-02 DIAGNOSIS — F419 Anxiety disorder, unspecified: Secondary | ICD-10-CM | POA: Diagnosis not present

## 2024-03-02 DIAGNOSIS — Z9884 Bariatric surgery status: Secondary | ICD-10-CM

## 2024-03-02 DIAGNOSIS — E1122 Type 2 diabetes mellitus with diabetic chronic kidney disease: Secondary | ICD-10-CM | POA: Diagnosis present

## 2024-03-02 DIAGNOSIS — K219 Gastro-esophageal reflux disease without esophagitis: Secondary | ICD-10-CM | POA: Diagnosis present

## 2024-03-02 DIAGNOSIS — G4733 Obstructive sleep apnea (adult) (pediatric): Secondary | ICD-10-CM | POA: Diagnosis present

## 2024-03-02 DIAGNOSIS — J441 Chronic obstructive pulmonary disease with (acute) exacerbation: Secondary | ICD-10-CM | POA: Diagnosis present

## 2024-03-02 DIAGNOSIS — G3183 Dementia with Lewy bodies: Secondary | ICD-10-CM | POA: Diagnosis present

## 2024-03-02 DIAGNOSIS — I5189 Other ill-defined heart diseases: Secondary | ICD-10-CM | POA: Diagnosis present

## 2024-03-02 DIAGNOSIS — D6959 Other secondary thrombocytopenia: Secondary | ICD-10-CM | POA: Diagnosis present

## 2024-03-02 DIAGNOSIS — E1151 Type 2 diabetes mellitus with diabetic peripheral angiopathy without gangrene: Secondary | ICD-10-CM | POA: Diagnosis present

## 2024-03-02 DIAGNOSIS — J9612 Chronic respiratory failure with hypercapnia: Principal | ICD-10-CM | POA: Diagnosis present

## 2024-03-02 DIAGNOSIS — N401 Enlarged prostate with lower urinary tract symptoms: Secondary | ICD-10-CM | POA: Diagnosis not present

## 2024-03-02 DIAGNOSIS — I272 Pulmonary hypertension, unspecified: Secondary | ICD-10-CM | POA: Diagnosis present

## 2024-03-02 DIAGNOSIS — M7989 Other specified soft tissue disorders: Secondary | ICD-10-CM | POA: Diagnosis present

## 2024-03-02 DIAGNOSIS — Z823 Family history of stroke: Secondary | ICD-10-CM

## 2024-03-02 DIAGNOSIS — J9621 Acute and chronic respiratory failure with hypoxia: Secondary | ICD-10-CM | POA: Diagnosis present

## 2024-03-02 DIAGNOSIS — Z91013 Allergy to seafood: Secondary | ICD-10-CM

## 2024-03-02 DIAGNOSIS — N1831 Chronic kidney disease, stage 3a: Secondary | ICD-10-CM | POA: Diagnosis present

## 2024-03-02 DIAGNOSIS — E1142 Type 2 diabetes mellitus with diabetic polyneuropathy: Secondary | ICD-10-CM | POA: Diagnosis present

## 2024-03-02 DIAGNOSIS — Z955 Presence of coronary angioplasty implant and graft: Secondary | ICD-10-CM

## 2024-03-02 DIAGNOSIS — E66813 Obesity, class 3: Secondary | ICD-10-CM | POA: Diagnosis present

## 2024-03-02 DIAGNOSIS — Z87891 Personal history of nicotine dependence: Secondary | ICD-10-CM

## 2024-03-02 DIAGNOSIS — J9622 Acute and chronic respiratory failure with hypercapnia: Secondary | ICD-10-CM | POA: Diagnosis present

## 2024-03-02 DIAGNOSIS — G2581 Restless legs syndrome: Secondary | ICD-10-CM | POA: Diagnosis present

## 2024-03-02 DIAGNOSIS — G43909 Migraine, unspecified, not intractable, without status migrainosus: Secondary | ICD-10-CM | POA: Diagnosis present

## 2024-03-02 DIAGNOSIS — N138 Other obstructive and reflux uropathy: Secondary | ICD-10-CM | POA: Diagnosis present

## 2024-03-02 DIAGNOSIS — I2510 Atherosclerotic heart disease of native coronary artery without angina pectoris: Secondary | ICD-10-CM | POA: Diagnosis present

## 2024-03-02 DIAGNOSIS — E875 Hyperkalemia: Secondary | ICD-10-CM | POA: Diagnosis present

## 2024-03-02 DIAGNOSIS — E785 Hyperlipidemia, unspecified: Secondary | ICD-10-CM | POA: Diagnosis present

## 2024-03-02 DIAGNOSIS — I252 Old myocardial infarction: Secondary | ICD-10-CM

## 2024-03-02 DIAGNOSIS — R0902 Hypoxemia: Secondary | ICD-10-CM

## 2024-03-02 DIAGNOSIS — R509 Fever, unspecified: Secondary | ICD-10-CM

## 2024-03-02 DIAGNOSIS — Z7982 Long term (current) use of aspirin: Secondary | ICD-10-CM

## 2024-03-02 DIAGNOSIS — Z880 Allergy status to penicillin: Secondary | ICD-10-CM

## 2024-03-02 DIAGNOSIS — Z89421 Acquired absence of other right toe(s): Secondary | ICD-10-CM

## 2024-03-02 DIAGNOSIS — D696 Thrombocytopenia, unspecified: Secondary | ICD-10-CM | POA: Diagnosis not present

## 2024-03-02 DIAGNOSIS — Z8249 Family history of ischemic heart disease and other diseases of the circulatory system: Secondary | ICD-10-CM

## 2024-03-02 DIAGNOSIS — Z8711 Personal history of peptic ulcer disease: Secondary | ICD-10-CM

## 2024-03-02 DIAGNOSIS — Z91048 Other nonmedicinal substance allergy status: Secondary | ICD-10-CM

## 2024-03-02 LAB — BLOOD GAS, VENOUS
Acid-base deficit: 0.3 mmol/L (ref 0.0–2.0)
Bicarbonate: 27.1 mmol/L (ref 20.0–28.0)
Delivery systems: POSITIVE
FIO2: 100 %
O2 Saturation: 100 %
Patient temperature: 37
RATE: 16 {breaths}/min
pCO2, Ven: 55 mmHg (ref 44–60)
pH, Ven: 7.3 (ref 7.25–7.43)
pO2, Ven: 247 mmHg — ABNORMAL HIGH (ref 32–45)

## 2024-03-02 LAB — COMPREHENSIVE METABOLIC PANEL WITH GFR
ALT: 20 U/L (ref 0–44)
AST: 33 U/L (ref 15–41)
Albumin: 3.7 g/dL (ref 3.5–5.0)
Alkaline Phosphatase: 83 U/L (ref 38–126)
Anion gap: 9 (ref 5–15)
BUN: 29 mg/dL — ABNORMAL HIGH (ref 8–23)
CO2: 27 mmol/L (ref 22–32)
Calcium: 8.4 mg/dL — ABNORMAL LOW (ref 8.9–10.3)
Chloride: 102 mmol/L (ref 98–111)
Creatinine, Ser: 1.35 mg/dL — ABNORMAL HIGH (ref 0.61–1.24)
GFR, Estimated: 53 mL/min — ABNORMAL LOW (ref >60)
Glucose, Bld: 187 mg/dL — ABNORMAL HIGH (ref 70–99)
Potassium: 5.1 mmol/L (ref 3.5–5.1)
Sodium: 138 mmol/L (ref 135–145)
Total Bilirubin: 0.7 mg/dL (ref 0.0–1.2)
Total Protein: 7.1 g/dL (ref 6.5–8.1)

## 2024-03-02 LAB — CBC WITH DIFFERENTIAL/PLATELET
Abs Immature Granulocytes: 0.04 K/uL (ref 0.00–0.07)
Basophils Absolute: 0 K/uL (ref 0.0–0.1)
Basophils Relative: 0 %
Eosinophils Absolute: 0.4 K/uL (ref 0.0–0.5)
Eosinophils Relative: 4 %
HCT: 36.2 % — ABNORMAL LOW (ref 39.0–52.0)
Hemoglobin: 11 g/dL — ABNORMAL LOW (ref 13.0–17.0)
Immature Granulocytes: 0 %
Lymphocytes Relative: 13 %
Lymphs Abs: 1.3 K/uL (ref 0.7–4.0)
MCH: 29.7 pg (ref 26.0–34.0)
MCHC: 30.4 g/dL (ref 30.0–36.0)
MCV: 97.8 fL (ref 80.0–100.0)
Monocytes Absolute: 0.7 K/uL (ref 0.1–1.0)
Monocytes Relative: 7 %
Neutro Abs: 7.3 K/uL (ref 1.7–7.7)
Neutrophils Relative %: 76 %
Platelets: 115 K/uL — ABNORMAL LOW (ref 150–400)
RBC: 3.7 MIL/uL — ABNORMAL LOW (ref 4.22–5.81)
RDW: 15.5 % (ref 11.5–15.5)
WBC: 9.6 K/uL (ref 4.0–10.5)
nRBC: 0 % (ref 0.0–0.2)

## 2024-03-02 LAB — RESP PANEL BY RT-PCR (RSV, FLU A&B, COVID)  RVPGX2
Influenza A by PCR: NEGATIVE
Influenza B by PCR: NEGATIVE
Resp Syncytial Virus by PCR: NEGATIVE
SARS Coronavirus 2 by RT PCR: NEGATIVE

## 2024-03-02 LAB — BRAIN NATRIURETIC PEPTIDE: B Natriuretic Peptide: 58.1 pg/mL (ref 0.0–100.0)

## 2024-03-02 LAB — LACTIC ACID, PLASMA
Lactic Acid, Venous: 2.1 mmol/L (ref 0.5–1.9)
Lactic Acid, Venous: 0.7 mmol/L (ref 0.5–1.9)

## 2024-03-02 LAB — PROTIME-INR
INR: 1.1 (ref 0.8–1.2)
Prothrombin Time: 14.8 s (ref 11.4–15.2)

## 2024-03-02 MED ORDER — IPRATROPIUM-ALBUTEROL 0.5-2.5 (3) MG/3ML IN SOLN
9.0000 mL | Freq: Once | RESPIRATORY_TRACT | Status: AC
  Administered 2024-03-02: 9 mL via RESPIRATORY_TRACT
  Filled 2024-03-02: qty 9

## 2024-03-02 MED ORDER — CHLORHEXIDINE GLUCONATE CLOTH 2 % EX PADS
6.0000 | MEDICATED_PAD | Freq: Every day | CUTANEOUS | Status: DC
  Administered 2024-03-03: 6 via TOPICAL
  Filled 2024-03-02: qty 6

## 2024-03-02 MED ORDER — ACETAMINOPHEN 650 MG RE SUPP: 650.0000 mg | Freq: Four times a day (QID) | RECTAL | Status: DC | PRN

## 2024-03-02 MED ORDER — ENOXAPARIN SODIUM 60 MG/0.6ML IJ SOSY
0.5000 mg/kg | PREFILLED_SYRINGE | INTRAMUSCULAR | Status: DC
  Administered 2024-03-03 – 2024-03-04 (×2): 60 mg via SUBCUTANEOUS
  Filled 2024-03-02 (×2): qty 0.6

## 2024-03-02 MED ORDER — SODIUM CHLORIDE 0.9 % IV SOLN
500.0000 mg | INTRAVENOUS | Status: DC
  Administered 2024-03-03 – 2024-03-04 (×2): 500 mg via INTRAVENOUS
  Filled 2024-03-02 (×2): qty 5

## 2024-03-02 MED ORDER — SODIUM CHLORIDE 0.9 % IV SOLN
2.0000 g | INTRAVENOUS | Status: DC
  Administered 2024-03-03 – 2024-03-04 (×2): 2 g via INTRAVENOUS
  Filled 2024-03-02 (×2): qty 20

## 2024-03-02 MED ORDER — LACTATED RINGERS IV SOLN
150.0000 mL/h | INTRAVENOUS | Status: AC
  Administered 2024-03-02 – 2024-03-03 (×3): 150 mL/h via INTRAVENOUS

## 2024-03-02 MED ORDER — VANCOMYCIN HCL IN DEXTROSE 1-5 GM/200ML-% IV SOLN
1000.0000 mg | Freq: Once | INTRAVENOUS | Status: AC
  Administered 2024-03-02: 1000 mg via INTRAVENOUS
  Filled 2024-03-02: qty 200

## 2024-03-02 MED ORDER — IOHEXOL 350 MG/ML SOLN
75.0000 mL | Freq: Once | INTRAVENOUS | Status: AC | PRN
Start: 2024-03-02 — End: 2024-03-02
  Administered 2024-03-02: 75 mL via INTRAVENOUS

## 2024-03-02 MED ORDER — POLYETHYLENE GLYCOL 3350 17 G PO PACK: 17.0000 g | PACK | Freq: Every day | ORAL | Status: DC | PRN

## 2024-03-02 MED ORDER — ACETAMINOPHEN 325 MG PO TABS: 650.0000 mg | ORAL_TABLET | Freq: Four times a day (QID) | ORAL | Status: DC | PRN

## 2024-03-02 MED ORDER — LACTATED RINGERS IV SOLN: INTRAVENOUS | Status: AC

## 2024-03-02 MED ORDER — SODIUM CHLORIDE 0.9 % IV BOLUS (SEPSIS)
250.0000 mL | Freq: Once | INTRAVENOUS | Status: AC
  Administered 2024-03-02: 250 mL via INTRAVENOUS

## 2024-03-02 MED ORDER — IPRATROPIUM-ALBUTEROL 0.5-2.5 (3) MG/3ML IN SOLN: 3.0000 mL | Freq: Four times a day (QID) | RESPIRATORY_TRACT | Status: DC | PRN

## 2024-03-02 MED ORDER — SODIUM CHLORIDE 0.9 % IV SOLN
2.0000 g | Freq: Once | INTRAVENOUS | Status: AC
  Administered 2024-03-02: 2 g via INTRAVENOUS
  Filled 2024-03-02: qty 12.5

## 2024-03-02 NOTE — Consult Note (Signed)
 CODE SEPSIS - PHARMACY COMMUNICATION  **Broad Spectrum Antibiotics should be administered within 1 hour of Sepsis diagnosis**  Time Code Sepsis Called/Page Received: 1835  Antibiotics Ordered: Cefepime  and vancomycin   Time of 1st antibiotic administration: 1900  Additional action taken by pharmacy: none  If necessary, Name of Provider/Nurse Contacted: n/a   Kennedy Bohanon Rodriguez-Guzman PharmD, BCPS 03/02/2024 6:53 PM

## 2024-03-02 NOTE — Progress Notes (Signed)
 Elink is following code sepsis.

## 2024-03-02 NOTE — ED Provider Notes (Signed)
 [facility] Provider Note    Event Date/Time   First MD Initiated Contact with Patient 03/02/24 1834     (approximate)   History   Code Sepsis and Respiratory Distress   HPI  Brian Kramer is a 78 y.o. male past medical history significant for anemia, COPD on chronic 2 L, CAD, CHF, dementia, who presents to the emergency department with hypoxia.  History is provided by EMS.  States that he went to go get his COVID and influenza vaccination today.  When he got home started having shortness of breath.  Called EMS and when they arrived his oxygen was in the 50s.  Placed on CPAP and improved to the 80s.  Febrile on arrival.  Given DuoNeb treatment, IV Solu-Medrol , magnesium 2 g, albuterol , CPAP prior to arrival.  Given Tylenol  for fever.  Patient denies any chest pain.  Just states that he feels bad.  States that his shaking is normal.  Arrived and states that he got COVID and influenza vaccine on Monday.  Today had significantly worsening shortness of breath and increased work of breathing.  They do endorse a history of dementia.  States that he took all of his home medications this morning.  No history of DVT or PE.  Recent hospitalizations.  Not on anticoagulation.       Physical Exam   Triage Vital Signs: ED Triage Vitals  Encounter Vitals Group     BP      Girls Systolic BP Percentile      Girls Diastolic BP Percentile      Boys Systolic BP Percentile      Boys Diastolic BP Percentile      Pulse      Resp      Temp      Temp src      SpO2      Weight      Height      Head Circumference      Peak Flow      Pain Score      Pain Loc      Pain Education      Exclude from Growth Chart     Most recent vital signs: Vitals:   03/02/24 1839 03/02/24 1852  BP: (!) 153/76   Pulse: (!) 124   Resp: (!) 25   Temp: (!) 101.4 F (38.6 C)   SpO2:  100%    Physical Exam Constitutional:      General: He is in acute distress.      Appearance: He is well-developed. He is obese. He is ill-appearing.  HENT:     Head: Atraumatic.  Eyes:     Conjunctiva/sclera: Conjunctivae normal.  Cardiovascular:     Rate and Rhythm: Regular rhythm.  Pulmonary:     Effort: Respiratory distress present.     Breath sounds: Wheezing and rhonchi present.     Comments: Arrives on CPAP, speaking in 2 word sentences Abdominal:     Tenderness: There is no abdominal tenderness.  Musculoskeletal:     Cervical back: Normal range of motion.     Right lower leg: No edema.     Left lower leg: No edema.  Skin:    General: Skin is warm.     Capillary Refill: Capillary refill takes less than 2 seconds.  Neurological:     Mental Status: He is alert. Mental status is at baseline.     IMPRESSION / MDM / ASSESSMENT AND PLAN /  ED COURSE  I reviewed the triage vital signs and the nursing notes.  Differential diagnosis including infectious process including pneumonia, viral illness including COVID/influenza, heart failure, pulmonary embolism, ACS, anemia  Immediately placed on BiPAP.  Patient has full code.  Given DuoNeb treatments.  Patient already received IV Solu-Medrol , magnesium, Tylenol  prior to arrival.  Blood cultures obtained.  Will obtain a lactic acid and start antibiotics given significant concern for sepsis.  EKG  I, Clotilda Punter, the attending physician, personally viewed and interpreted this ECG.  EKG reading as atrial flutter however significant wandering baseline and concern for sinus tachycardia.  Heart rate of 123.  QTc 584.  Incomplete right bundle branch block.  No significant change when compared to prior EKG.  More difficult to discern P waves today.  No tachycardic or bradycardic dysrhythmias while on cardiac telemetry.  RADIOLOGY I independently reviewed imaging, my interpretation of imaging: Chest x-ray  LABS (all labs ordered are listed, but only abnormal results are displayed) Labs interpreted as -    Labs  Reviewed  LACTIC ACID, PLASMA - Abnormal; Notable for the following components:      Result Value   Lactic Acid, Venous 2.1 (*)    All other components within normal limits  COMPREHENSIVE METABOLIC PANEL WITH GFR - Abnormal; Notable for the following components:   Glucose, Bld 187 (*)    BUN 29 (*)    Creatinine, Ser 1.35 (*)    Calcium  8.4 (*)    GFR, Estimated 53 (*)    All other components within normal limits  CBC WITH DIFFERENTIAL/PLATELET - Abnormal; Notable for the following components:   RBC 3.70 (*)    Hemoglobin 11.0 (*)    HCT 36.2 (*)    Platelets 115 (*)    All other components within normal limits  BLOOD GAS, VENOUS - Abnormal; Notable for the following components:   pCO2, Ven 68 (*)    pO2, Ven <31 (*)    Bicarbonate 29.8 (*)    All other components within normal limits  RESP PANEL BY RT-PCR (RSV, FLU A&B, COVID)  RVPGX2  CULTURE, BLOOD (ROUTINE X 2)  CULTURE, BLOOD (ROUTINE X 2)  PROTIME-INR  BRAIN NATRIURETIC PEPTIDE  LACTIC ACID, PLASMA  URINALYSIS, W/ REFLEX TO CULTURE (INFECTION SUSPECTED)  BLOOD GAS, VENOUS  CBC  CREATININE, SERUM  COMPREHENSIVE METABOLIC PANEL WITH GFR  CBC     MDM  Presents to the emergency department with significant respiratory distress.  Patient is full code.  Immediately placed on BiPAP and given DuoNeb treatments.  Blood cultures obtained.  Treated for community-acquired pneumonia with Pseudomonas and MRSA risk factors.  Recent hospitalizations.  Called the 30 cc/kg of IV fluids may be detrimental to the patient, given a 250 bolus and will reevaluate.   COVID influenza testing negative.  Lactic acid 2.1.  Hypercarbic on VBG.  Creatinine appears to be at baseline.  Platelets at baseline.  No leukocytosis.  Anemia but hemoglobin is stable.  Normal BNP but patient is obese so could be falsely low.  Chest x-ray concerning for pulmonary congestion and edema with bilateral pleural effusions.  CTA ordered given high risk for  pulmonary embolism.  Consulted hospitalist for admission.  Feel that he would be stable for stepdown.  Repeating VBG.     PROCEDURES:  Critical Care performed: yes  .Critical Care  Performed by: Punter Clotilda, MD Authorized by: Punter Clotilda, MD   Critical care provider statement:    Critical care time (minutes):  45   Critical care time was exclusive of:  Separately billable procedures and treating other patients   Critical care was necessary to treat or prevent imminent or life-threatening deterioration of the following conditions:  Respiratory failure   Critical care was time spent personally by me on the following activities:  Development of treatment plan with patient or surrogate, discussions with consultants, evaluation of patient's response to treatment, examination of patient, ordering and review of laboratory studies, ordering and review of radiographic studies, ordering and performing treatments and interventions, pulse oximetry, re-evaluation of patient's condition and review of old charts   Care discussed with: admitting provider     Patient's presentation is most consistent with acute presentation with potential threat to life or bodily function.   MEDICATIONS ORDERED IN ED: Medications  lactated ringers  infusion ( Intravenous New Bag/Given 03/02/24 1921)  sodium chloride  0.9 % bolus 250 mL (250 mLs Intravenous New Bag/Given 03/02/24 1906)  vancomycin  (VANCOCIN ) IVPB 1000 mg/200 mL premix (1,000 mg Intravenous New Bag/Given 03/02/24 1919)  lactated ringers  infusion (has no administration in time range)  enoxaparin  (LOVENOX ) injection 40 mg (has no administration in time range)  cefTRIAXone  (ROCEPHIN ) 2 g in sodium chloride  0.9 % 100 mL IVPB (has no administration in time range)  azithromycin  (ZITHROMAX ) 500 mg in sodium chloride  0.9 % 250 mL IVPB (has no administration in time range)  acetaminophen  (TYLENOL ) tablet 650 mg (has no administration in time range)    Or   acetaminophen  (TYLENOL ) suppository 650 mg (has no administration in time range)  polyethylene glycol (MIRALAX  / GLYCOLAX ) packet 17 g (has no administration in time range)  ipratropium-albuterol  (DUONEB) 0.5-2.5 (3) MG/3ML nebulizer solution 3 mL (has no administration in time range)  ceFEPIme  (MAXIPIME ) 2 g in sodium chloride  0.9 % 100 mL IVPB (0 g Intravenous Stopped 03/02/24 1947)  ipratropium-albuterol  (DUONEB) 0.5-2.5 (3) MG/3ML nebulizer solution 9 mL (9 mLs Nebulization Given 03/02/24 1901)  iohexol  (OMNIPAQUE ) 350 MG/ML injection 75 mL (75 mLs Intravenous Contrast Given 03/02/24 2001)    FINAL CLINICAL IMPRESSION(S) / ED DIAGNOSES   Final diagnoses:  Acute hypoxic on chronic hypercapnic respiratory failure (HCC)  Hypoxia  Fever, unspecified fever cause     Rx / DC Orders   ED Discharge Orders     None        Note:  This document was prepared using Dragon voice recognition software and may include unintentional dictation errors.   Suzanne Kirsch, MD 03/02/24 2001

## 2024-03-02 NOTE — H&P (Incomplete)
 History and Physical  Jordanny Waddington FMW:[id number] DOB: Feb 07, 1946 DOA: 03/02/2024 PCP: Alla Amis, MD  Chief Complaint: feels bad Historian: patient, wife  HPI:  Maaz Spiering is a 78 y.o. male with a PMH significant for chronic respiratory failure on 2 L nasal cannula at baseline, PAD, CAD, HLD, pulmonary hypertension, depression, dementia, CKD stage IIIa, BPH, IDA. At baseline, they live at home with their wife and are independent with their ADLs. For short distances at home does not use any assistive devices but outside the whole uses cane, walker, or electric chair.   They presented from home to the ED on 03/02/2024 with respiratory distress x1 days. Wife provides most of the history as patient is on bipap at time of encounter. He is not in acute distress. Pt was in normal state of health until about noon today when he began to notice he was short of breath with minimal exertion. He did try his home albuterol  inhaler without any improvement so they called EMS. He had reported hypoxia. Denies diaphoresis or chest pain. Wife states that he has had a productive cough recently. Denies acute LE swelling.  He wears 2Lnc at baseline.   In the ED, it was found that they had increased WOB and was placed on bipap. He had a fever of 101.4, tachycardia to 124, and tachypnea to 20. BP was stable at 153/76 Significant findings included: VBG: pH7.25, pCO2 68, pO2<31, bicarb 29.8 prior to bipap placement. LA 2.1. Na+ 138, K+ 5.1, glucose 187, Cr 1.35 (at baseline). WBC 9.6, hgb 11, platelets 115 (baseline). BNP 58.1 Chest xray: cardiac enlargement with pulmonary vascular congestion, perihilar edema, small bilateral pleural effusions Chest CTA: negative for PE. Consistent with multifocal pna  They were initially treated with duonebs, cefepime , LR, NS, vancomycin .   Patient was admitted to medicine service for further workup and management of sepsis as outlined in detail  below.  Assessment/Plan Principal Problem:   Sepsis (HCC)  Sepsis  acute on chronic hypoxic respiratory failure  CAP-on 2 L at baseline and is now on BiPAP for increased work of breathing.  Meets criteria with fever 101.4, tachycardia 109, tachypnea 30, lactic acid 2.1. CTA chest negative for PE suggestive of CAP and possible aspiration. Also incidental finding needing 3 month follow up for mediastinal lymphadenopathy which is likely reactive in this case.  - Continue BiPAP - CTA chest pending to r/o PE - continue IV Abx- to azithromycin  and cefepime   - wean O2 as tolerated to 2L - continue PRN bronchodilators. On trelegy at home - incentive spirometer.   PAD  CAD-had left heart cath 04/2020 which was essentially normal with EF 60%. Pulmonary hypertension-on chest x-ray, patient has cardiac enlargement with pulmonary vascular congestion, perihilar edema and bilateral pleural effusions - Update echo - continue home rosuvastatin    Thrombocytopenia-appears to be chronic.  Platelets 115 - CBC a.m.  Anxiety - continue home duloxetine  - hold home seroquel  as he is sedated and on bipap  RLS- continue home requip   BPH- continue home flomax   Past Medical History:  Diagnosis Date  . Anemia   . Anginal pain   . Anxiety   . Arthritis   . Bilateral renal cysts   . CKD (chronic kidney disease), stage III (HCC)   . COPD (chronic obstructive pulmonary disease) (HCC)   . Coronary artery disease    a.) MI 03/1995 - no PCI; b.) LHC/PCI 1997 - stent (unknown type) x 1 to RCA; c.) LHC/PCI 2000 - stent (  unknown type) x 1 to LCx; d.) LHC 04/02/2010 - insig CAD with widely patent stents; medical mgmt; e.) LHC 04/30/2015 - patent stents; insig CAD; medical mgmt; f.) R/LHC 05/01/20: EF 60%, norm cors.  . Depression   . Diastolic dysfunction 09/05/2021   a.)  TTE 09/05/2021: EF 50%, mild BAE, trivial PR, mild MR/TR; pHTN (RVSP 50 mmHg), G1DD.  SABRA Essential hypertension 04/08/2021  . GERD  (gastroesophageal reflux disease)    a.) improved s/p sleeve gastrectomy; no daily Tx  . History of hiatal hernia 2004   a.) s/p repair  . History of obstructive sleep apnea    a.) resolved s/p sleeve gastrectomy; no longer requires nocturnal PAP therapy  . HLD (hyperlipidemia)   . Incomplete right bundle branch block (RBBB)   . Insomnia   . Lewy body dementia (HCC)   . Long term current use of aspirin    . Loosening of knee joint prosthesis   . Migraines   . Myocardial infarction [facility]) 03/1995   a.) full details unknown; LHC performed, however no PCI performed.  . On supplemental oxygen by nasal cannula    a.) 2L/[place] PRN  . PAD (peripheral artery disease) 04/08/2021  . Peripheral neuropathy   . Pneumonia   . Presbycusis   . PUD (peptic ulcer disease)   . Pulmonary hypertension (HCC)    a.) takes ambrisentan ; b.)  R/LHC 05/01/2020: EF 60%, mean PA 30, mean PCWP 16, RVEDP 19, Ao sat 95, PA sat 74, CO 8.2, PVR 137, SVR 736; c.)  TTE 09/05/2021: EF 50%, RVSP 50 mmHg.  SABRA RLS (restless legs syndrome)    a.) takes ropinirole   . SOB (shortness of breath) 06/28/2022  . Thrombocytopenia   . Tremor   . Type 2 diabetes, diet controlled (HCC)     Past Surgical History:  Procedure Laterality Date  . AMPUTATION TOE Right 12/20/2021   Procedure: AMPUTATION TOE - SECOND AND THIRD - 71174;  Surgeon: Neill Boas, DPM;  Location: [facility] ORS;  Service: Podiatry;  Laterality: Right;  . CARPOMETACARPAL ([facility]) FUSION OF THUMB Right 04/08/2018   Procedure: CARPOMETACARPAL [facility]) FUSION OF THUMB;  Surgeon: Kathlynn Sharper, MD;  Location: [facility] OR[facility];  Service: Orthopedics;  Laterality: Right;  . CARPOMETACARPAL ([facility]) FUSION OF THUMB Left 12/02/2018   Procedure: CARPOMETACARPAL ([facility]) FUSION OF  LEFT THUMB, DIABETIC;  Surgeon: Kathlynn Sharper, MD;  Location: [facility] OR[facility];  Service: Orthopedics;  Laterality: Left;  . CATARACT EXTRACTION Bilateral 2015  . COLONOSCOPY    . COLONOSCOPY WITH PROPOFOL  N/A 05/19/2018    Procedure: COLONOSCOPY WITH PROPOFOL ;  Surgeon: Toledo, Ladell POUR, MD;  Location: [facility];  Service: Gastroenterology;  Laterality: N/A;  . CORONARY ANGIOPLASTY WITH STENT PLACEMENT Left 1997   Procedure: CORONARY ANGIOPLASTY WITH STENT PLACEMENT; stent x1 (unknown type) to RCA  . CORONARY ANGIOPLASTY WITH STENT PLACEMENT Left 2000   Procedure: CORONARY ANGIOPLASTY WITH STENT PLACEMENT; stent x1 (unknown type) to LCx  . EXCISION PARTIAL PHALANX Left 10/04/2021   Procedure: EXCISION PARTIAL PHALANX - 2, 3, 4;  Surgeon: Neill Boas, DPM;  Location: [facility];  Service: Podiatry;  Laterality: Left;  . EYE SURGERY     cataract bilateral with lens  . FLEXOR TENOTOMY  Right 12/20/2021   Procedure: FLEXOR TENOTOMY - FOURTH TOE - 71989;  Surgeon: Neill Boas, DPM;  Location: [facility];  Service: Podiatry;  Laterality: Right;  . HAMMER TOE SURGERY Right 01/15/2018   Procedure: HAMMER TOE CORRECTION-2ND & 3RD;  Surgeon: Lilli Cough, DPM;  Location:  [facility] OR[facility];  Service: Podiatry;  Laterality: Right;  . HEEL SPUR RESECTION  2012  . INGUINAL HERNIA REPAIR Bilateral   . KNEE ARTHROSCOPY Right 2005  . KNEE ARTHROSCOPY Left 2006  . LAPAROSCOPIC GASTRIC SLEEVE RESECTION N/A 03/17/2016  . LEFT HEART CATH AND CORONARY ANGIOGRAPHY Left 04/02/2010   Procedure: LEFT HEART CATH AND CORONARY ANGIOGRAPHY; Location: [facility]; Surgeon: Lynwood Sous, MD  . LEFT HEART CATH AND CORONARY ANGIOGRAPHY Left 04/30/2015   Procedure: LEFT HEART CATH AND CORONARY ANGIOGRAPHY; Location: [facility]; Surgeon: FABIENE Jama Constable, MD  . RIGHT AND LEFT HEART CATH Left 05/01/2020   Procedure: RIGHT AND LEFT HEART CATH;  Surgeon: Florencio Cara BIRCH, MD;  Location: [facility];  Service: Cardiovascular;  Laterality: Left;  . Right shoulder AC separation surgery in 1995 Right   . Stem Cells to left heel  2017  . TONSILLECTOMY    . TOTAL KNEE ARTHROPLASTY Left 2007  . TOTAL KNEE ARTHROPLASTY Right 2015  . TOTAL KNEE REVISION Left  04/06/2023   Procedure: Left total knee revision, both components;  Surgeon: Lorelle Hussar, MD;  Location: [facility];  Service: Orthopedics;  Laterality: Left;  . ULNAR TUNNEL RELEASE Left 04/14/2019   Procedure: LEFT CUBITAL TUNNEL RELEASE;  Surgeon: Kathlynn Sharper, MD;  Location: [facility];  Service: Orthopedics;  Laterality: Left;  . UPPER GI ENDOSCOPY  2017  . VASECTOMY     1970s  . WEIL OSTEOTOMY Right 01/15/2018   Procedure: WEIL OSTEOTOMY-2ND & 3RD;  Surgeon: Lilli Cough, DPM;  Location: [facility];  Service: Podiatry;  Laterality: Right;     reports that he quit smoking about 30 years ago. His smoking use included cigarettes. He has never used smokeless tobacco. He reports current alcohol use of about 1.0 - 2.0 standard drink of alcohol per week. He reports that he does not use drugs.  Allergies  Allergen Reactions  . Tape Other (See Comments)    Round, white EKG pads only - ate holes in my skin 1997 when stays on skin for longer period Any tape OK if it doesn't stay on too long  . Penicillins Itching, Rash and Other (See Comments)    Has patient had a PCN reaction causing immediate rash, facial/tongue/throat swelling, SOB or lightheadedness with hypotension: Yes Has patient had a PCN reaction causing severe rash involving mucus membranes or skin necrosis: No Has patient had a PCN reaction that required hospitalization: No Has patient had a PCN reaction occurring within the last 10 years: No If all of the above answers are NO, then may proceed with Cephalosporin use.   . Shellfish Allergy Rash and Other (See Comments)    PT CAN NOT EAT CRAB or lobster, BUT CAN EAT shrimp. No problems with betadine  or iodine     Family History  Problem Relation Age of Onset  . Hypertension Mother   . Hypertension Father   . Stroke Father   . Hypertension Maternal Grandmother     Prior to Admission medications   Medication Sig Start Date End Date Taking? Authorizing Provider   albuterol  (VENTOLIN  HFA) 108 (90 Base) MCG/ACT inhaler Inhale 2 puffs into the lungs every 6 (six) hours as needed for wheezing or shortness of breath.    [provider]  ambrisentan  (LETAIRIS ) 5 MG tablet Take 5 mg by mouth daily. 02/21/21   [provider]  aspirin  EC 81 MG tablet Take 81 mg by mouth at bedtime.     [provider]  azelastine  (ASTELIN ) 0.1 %  nasal spray Place 1 spray into both nostrils 2 (two) times daily as needed for rhinitis or allergies. Use in each nostril as directed    [provider]  Calcium  500-100 MG-UNIT CHEW Chew 500 mg by mouth 2 (two) times daily. Calcium  is bariatric chewable with vitamin D included    [provider]  clobetasol  ointment (TEMOVATE ) 0.05 % Apply 1 Application topically 2 (two) times daily. 07/27/23   [provider]  ferrous sulfate  325 (65 FE) MG tablet Take 325 mg by mouth daily with breakfast.    [provider]  Fluticasone -Umeclidin-Vilant (TRELEGY ELLIPTA) 100-62.5-25 MCG/ACT AEPB Inhale 1 puff into the lungs daily. 01/24/21   [provider]  ipratropium (ATROVENT ) 0.03 % nasal spray Place 2 sprays into both nostrils 3 (three) times daily as needed for rhinitis.     [provider]  LOKELMA  5 g packet Take by mouth. 08/05/23   [provider]  LYRICA  200 MG capsule Take 200 mg by mouth 3 (three) times daily.     [provider]  mometasone (ELOCON) 0.1 % lotion 1-4 drops See admin instructions. 1-4 drops in each ear as needed for itching 08/19/21   [provider]  Multiple Vitamins-Minerals (BARIATRIC MULTIVITAMINS/IRON  PO) Take 1 tablet by mouth daily.    [provider]  nitroGLYCERIN (NITROSTAT) 0.4 MG SL tablet Place 0.4 mg under the tongue every 5 (five) minutes as needed for chest pain.  10/21/12   [provider]  OXYGEN Inhale 2 L into the lungs at bedtime.    [provider]  polyethylene glycol  (MIRALAX  / GLYCOLAX ) 17 g packet Take by mouth. 04/09/23   [provider]  QUEtiapine  (SEROQUEL ) 25 MG tablet Take 25 mg by mouth at bedtime. 05/01/23   [provider]  rOPINIRole  (REQUIP  XL) 4 MG 24 hr tablet Take 4 mg by mouth at bedtime.    [provider]  rosuvastatin  (CRESTOR ) 5 MG tablet Take 5 mg by mouth every evening. 08/22/17   [provider]  tamsulosin  (FLOMAX ) 0.4 MG CAPS capsule Take 1 capsule (0.4 mg total) by mouth daily. 09/10/23   Vaillancourt, Samantha, PA-C   I have personally, briefly reviewed patient's prior medical records in [place] [facility]  Objective: Blood pressure (!) 153/76, pulse (!) 124, temperature (!) 101.4 F (38.6 C), temperature source Axillary, resp. rate (!) 25, SpO2 100%.   Constitutional: NAD, calm, comfortable*** HEENT: lids and conjunctivae normal. MMM. Posterior pharynx clear of any exudate or lesions. Normal dentition.  Neck: normal, supple, no masses, no thyromegaly Respiratory: CTAB, no wheezing, no crackles. Normal respiratory effort. No accessory muscle use.  Cardiovascular: RRR, no murmurs / rubs / gallops. No extremity edema. 2+ pedal pulses. no clubbing / cyanosis.  Abdomen: soft, NT, ND, no masses or HSM palpated. Musculoskeletal: No joint deformity upper and lower extremities. Normal muscle tone.  Skin: dry, intact, normal color, normal temperature on exposed skin Neurologic: Alert and oriented x 3. Normal speech. Grossly non-focal exam. PERRL Psychiatric: Normal mood. Congruent affect.  Labs on Admission: I have personally reviewed admission labs and imaging studies  CBC    Component Value Date/Time   WBC 9.6 03/02/2024 1849   RBC 3.70 (L) 03/02/2024 1849   HGB 11.0 (L) 03/02/2024 1849   HGB 9.7 (L) 02/01/2024 0935   HCT 36.2 (L) 03/02/2024 1849   PLT 115 (L) 03/02/2024 1849   PLT 123 (L) 02/01/2024 0935   MCV 97.8 03/02/2024 1849  MCH 29.7 03/02/2024 1849   MCHC 30.4 03/02/2024 1849    RDW 15.5 03/02/2024 1849   LYMPHSABS 1.3 03/02/2024 1849   MONOABS 0.7 03/02/2024 1849   EOSABS 0.4 03/02/2024 1849   BASOSABS 0.0 03/02/2024 1849   CMP     Component Value Date/Time   NA 138 03/02/2024 1849   K 5.1 03/02/2024 1849   CL 102 03/02/2024 1849   CO2 27 03/02/2024 1849   GLUCOSE 187 (H) 03/02/2024 1849   BUN 29 (H) 03/02/2024 1849   CREATININE 1.35 (H) 03/02/2024 1849   CALCIUM  8.4 (L) 03/02/2024 1849   PROT 7.1 03/02/2024 1849   ALBUMIN 3.7 03/02/2024 1849   AST 33 03/02/2024 1849   ALT 20 03/02/2024 1849   ALKPHOS 83 03/02/2024 1849   BILITOT 0.7 03/02/2024 1849   GFRNONAA 53 (L) 03/02/2024 1849   GFRAA >60 01/12/2018 1450    Radiological Exams on Admission: DG Chest Port 1 View Result Date: 03/02/2024 CLINICAL DATA:  Question of sepsis to evaluate for abnormality. EXAM: PORTABLE CHEST 1 VIEW COMPARISON:  CT chest 12/23/2023 FINDINGS: Shallow inspiration. Cardiac enlargement with pulmonary vascular congestion. Perihilar and basilar airspace disease likely representing edema but could also indicate pneumonia or aspiration. Small pleural effusions, greater on the right. No pneumothorax. Mediastinal contours appear intact. Calcification of the aorta. Patient rotation limits examination. IMPRESSION: Cardiac enlargement with pulmonary vascular congestion, perihilar edema, and bilateral pleural effusions. Electronically Signed   By: Elsie Gravely M.D.   On: 03/02/2024 19:10    EKG: Independently reviewed. ***  DVT prophylaxis: enoxaparin  (LOVENOX ) injection 40 mg Start: 03/02/24 2015   Code Status: ***  Family Communication: ***  Disposition Plan: ***  Consults called: PIERRETTE Marien LITTIE Lenon, DO [facility]  03/02/2024, 8:12 PM    To contact the appropriate [facility] Attending or Consulting provider: Check [email or web address] for coverage from 7pm-7am

## 2024-03-02 NOTE — Sepsis Progress Note (Signed)
 Notified bedside nurse of need to draw repeat lactic acid.

## 2024-03-02 NOTE — H&P (Incomplete)
 History and Physical  Brian Kramer FMW:[id number] DOB: 12/31/1945 DOA: 03/02/2024 PCP: Alla Amis, MD  Chief Complaint: feels bad Historian: patient, wife  HPI:  Brian Kramer is a 78 y.o. male with a PMH significant for chronic respiratory failure on 2 L nasal cannula at baseline, PAD, CAD, HLD, pulmonary hypertension, depression, dementia, CKD stage IIIa, BPH, IDA. At baseline, they live at home with their wife and are independent with their ADLs. For short distances at home does not use any assistive devices but outside the whole uses cane, walker, or electric chair.   They presented from home to the ED on 03/02/2024 with respiratory distress x1 days. Wife provides most of the history as patient is on bipap at time of encounter. He is not in acute distress. Pt was in normal state of health until about noon today when he began to notice he was short of breath with minimal exertion. He did try his home albuterol  inhaler without any improvement so they called EMS. He had reported hypoxia. Denies diaphoresis or chest pain. Wife states that he has had a productive cough recently. Denies acute LE swelling.  He wears 2Lnc at baseline.   In the ED, it was found that they had increased WOB and was placed on bipap. He had a fever of 101.4, tachycardia to 124, and tachypnea to 20. BP was stable at 153/76 Significant findings included: VBG: pH7.25, pCO2 68, pO2<31, bicarb 29.8 prior to bipap placement. LA 2.1. Na+ 138, K+ 5.1, glucose 187, Cr 1.35 (at baseline). WBC 9.6, hgb 11, platelets 115 (baseline). BNP 58.1 Chest xray: cardiac enlargement with pulmonary vascular congestion, perihilar edema, small bilateral pleural effusions Chest CTA: negative for PE. Consistent with multifocal pna  They were initially treated with duonebs, cefepime , LR, NS, vancomycin .   Patient was admitted to medicine service for further workup and management of sepsis as outlined in detail  below.  Assessment/Plan Principal Problem:   Sepsis (HCC)  Sepsis  acute on chronic hypoxic respiratory failure  CAP-on 2 L at baseline and is now on BiPAP for increased work of breathing.  Meets criteria with fever 101.4, tachycardia 109, tachypnea 30, lactic acid 2.1. CTA chest negative for PE suggestive of CAP and possible aspiration. Also incidental finding needing 3 month follow up for mediastinal lymphadenopathy which is likely reactive in this case.  - Continue BiPAP - CTA chest pending to r/o PE - continue IV Abx- to azithromycin  and ceftriaxone  - wean O2 as tolerated to 2L - PT - continue PRN bronchodilators. On trelegy at home - incentive spirometer.  - tylenol  PRN for fever  PAD  CAD-had left heart cath 04/2020 which was essentially normal with EF 60%. Pulmonary hypertension-on chest x-ray, patient has cardiac enlargement with pulmonary vascular congestion, perihilar edema and bilateral pleural effusions - Update echo - continue home rosuvastatin    Thrombocytopenia-appears to be chronic.  Platelets 115 - CBC a.m.  Anxiety - continue home duloxetine  - hold home seroquel  as he is sedated and on bipap  RLS- continue home requip   BPH- continue home flomax   Past Medical History:  Diagnosis Date   Anemia    Anginal pain    Anxiety    Arthritis    Bilateral renal cysts    CKD (chronic kidney disease), stage III (HCC)    COPD (chronic obstructive pulmonary disease) (HCC)    Coronary artery disease    a.) MI 03/1995 - no PCI; b.) LHC/PCI 1997 - stent (unknown type) x 1 to  RCA; c.) LHC/PCI 2000 - stent (unknown type) x 1 to LCx; d.) LHC 04/02/2010 - insig CAD with widely patent stents; medical mgmt; e.) LHC 04/30/2015 - patent stents; insig CAD; medical mgmt; f.) R/LHC 05/01/20: EF 60%, norm cors.   Depression    Diastolic dysfunction 09/05/2021   a.)  TTE 09/05/2021: EF 50%, mild BAE, trivial PR, mild MR/TR; pHTN (RVSP 50 mmHg), G1DD.   Essential hypertension  04/08/2021   GERD (gastroesophageal reflux disease)    a.) improved s/p sleeve gastrectomy; no daily Tx   History of hiatal hernia 2004   a.) s/p repair   History of obstructive sleep apnea    a.) resolved s/p sleeve gastrectomy; no longer requires nocturnal PAP therapy   HLD (hyperlipidemia)    Incomplete right bundle branch block (RBBB)    Insomnia    Lewy body dementia (HCC)    Long term current use of aspirin     Loosening of knee joint prosthesis    Migraines    Myocardial infarction (HCC) 03/1995   a.) full details unknown; LHC performed, however no PCI performed.   On supplemental oxygen by nasal cannula    a.) 2L/[name]   PAD (peripheral artery disease) 04/08/2021   Peripheral neuropathy    Pneumonia    Presbycusis    PUD (peptic ulcer disease)    Pulmonary hypertension (HCC)    a.) takes ambrisentan ; b.)  R/LHC 05/01/2020: EF 60%, mean PA 30, mean PCWP 16, RVEDP 19, Ao sat 95, PA sat 74, CO 8.2, PVR 137, SVR 736; c.)  TTE 09/05/2021: EF 50%, RVSP 50 mmHg.   RLS (restless legs syndrome)    a.) takes ropinirole    SOB (shortness of breath) 06/28/2022   Thrombocytopenia    Tremor    Type 2 diabetes, diet controlled [facility])     Past Surgical History:  Procedure Laterality Date   AMPUTATION TOE Right 12/20/2021   Procedure: AMPUTATION TOE - SECOND AND THIRD - 28825;  Surgeon: Neill Boas, DPM;  Location: [facility] ORS;  Service: Podiatry;  Laterality: Right;   CARPOMETACARPAL ([facility]) FUSION OF THUMB Right 04/08/2018   Procedure: CARPOMETACARPAL [facility]) FUSION OF THUMB;  Surgeon: Kathlynn Sharper, MD;  Location: [facility] ORS;  Service: Orthopedics;  Laterality: Right;   CARPOMETACARPAL ([facility]) FUSION OF THUMB Left 12/02/2018   Procedure: CARPOMETACARPAL ([facility]) FUSION OF  LEFT THUMB, DIABETIC;  Surgeon: Kathlynn Sharper, MD;  Location: [facility] ORS;  Service: Orthopedics;  Laterality: Left;   CATARACT EXTRACTION Bilateral 2015   COLONOSCOPY     COLONOSCOPY WITH PROPOFOL  N/A 05/19/2018   Procedure:  COLONOSCOPY WITH PROPOFOL ;  Surgeon: Toledo, Ladell POUR, MD;  Location: [facility] ENDOSCOPY;  Service: Gastroenterology;  Laterality: N/A;   CORONARY ANGIOPLASTY WITH STENT PLACEMENT Left 1997   Procedure: CORONARY ANGIOPLASTY WITH STENT PLACEMENT; stent x1 (unknown type) to RCA   CORONARY ANGIOPLASTY WITH STENT PLACEMENT Left 2000   Procedure: CORONARY ANGIOPLASTY WITH STENT PLACEMENT; stent x1 (unknown type) to LCx   EXCISION PARTIAL PHALANX Left 10/04/2021   Procedure: EXCISION PARTIAL PHALANX - 2, 3, 4;  Surgeon: Neill Boas, DPM;  Location: [facility] OR[facility];  Service: Podiatry;  Laterality: Left;   EYE SURGERY     cataract bilateral with lens   FLEXOR TENOTOMY  Right 12/20/2021   Procedure: FLEXOR TENOTOMY - FOURTH TOE - 71989;  Surgeon: Neill Boas, DPM;  Location: [facility] OR[facility];  Service: Podiatry;  Laterality: Right;   HAMMER TOE SURGERY Right 01/15/2018   Procedure: HAMMER TOE CORRECTION-2ND & 3RD;  Surgeon: Lilli Cough, DPM;  Location: [facility] OR[facility];  Service: Podiatry;  Laterality: Right;   HEEL SPUR RESECTION  2012   INGUINAL HERNIA REPAIR Bilateral    KNEE ARTHROSCOPY Right 2005   KNEE ARTHROSCOPY Left 2006   LAPAROSCOPIC GASTRIC SLEEVE RESECTION N/A 03/17/2016   LEFT HEART CATH AND CORONARY ANGIOGRAPHY Left 04/02/2010   Procedure: LEFT HEART CATH AND CORONARY ANGIOGRAPHY; Location: [facility]; Surgeon: Lynwood Sous, MD   LEFT HEART CATH AND CORONARY ANGIOGRAPHY Left 04/30/2015   Procedure: LEFT HEART CATH AND CORONARY ANGIOGRAPHY; Location: [facility]; Surgeon: FABIENE Jama Constable, MD   RIGHT AND LEFT HEART CATH Left 05/01/2020   Procedure: RIGHT AND LEFT HEART CATH;  Surgeon: Florencio Cara BIRCH, MD;  Location: [facility];  Service: Cardiovascular;  Laterality: Left;   Right shoulder AC separation surgery in 1995 Right    Stem Cells to left heel  2017   TONSILLECTOMY     TOTAL KNEE ARTHROPLASTY Left 2007   TOTAL KNEE ARTHROPLASTY Right 2015   TOTAL KNEE REVISION Left 04/06/2023   Procedure: Left total  knee revision, both components;  Surgeon: Lorelle Hussar, MD;  Location: [facility];  Service: Orthopedics;  Laterality: Left;   ULNAR TUNNEL RELEASE Left 04/14/2019   Procedure: LEFT CUBITAL TUNNEL RELEASE;  Surgeon: Kathlynn Sharper, MD;  Location: [facility];  Service: Orthopedics;  Laterality: Left;   UPPER GI ENDOSCOPY  2017   VASECTOMY     1970s   WEIL OSTEOTOMY Right 01/15/2018   Procedure: WEIL OSTEOTOMY-2ND & 3RD;  Surgeon: Lilli Cough, DPM;  Location: [facility] OR[facility];  Service: Podiatry;  Laterality: Right;     reports that he quit smoking about 30 years ago. His smoking use included cigarettes. He has never used smokeless tobacco. He reports current alcohol use of about 1.0 - 2.0 standard drink of alcohol per week. He reports that he does not use drugs.  Allergies  Allergen Reactions   Tape Other (See Comments)    Round, white EKG pads only - ate holes in my skin 1997 when stays on skin for longer period Any tape OK if it doesn't stay on too long   Penicillins Itching, Rash and Other (See Comments)    Has patient had a PCN reaction causing immediate rash, facial/tongue/throat swelling, SOB or lightheadedness with hypotension: Yes Has patient had a PCN reaction causing severe rash involving mucus membranes or skin necrosis: No Has patient had a PCN reaction that required hospitalization: No Has patient had a PCN reaction occurring within the last 10 years: No If all of the above answers are NO, then may proceed with Cephalosporin use.    Shellfish Allergy Rash and Other (See Comments)    PT CAN NOT EAT CRAB or lobster, BUT CAN EAT shrimp. No problems with betadine  or iodine     Family History  Problem Relation Age of Onset   Hypertension Mother    Hypertension Father    Stroke Father    Hypertension Maternal Grandmother     Prior to Admission medications   Medication Sig Start Date End Date Taking? Authorizing Provider  albuterol  (VENTOLIN  HFA) 108 (90 Base) MCG/ACT  inhaler Inhale 2 puffs into the lungs every 6 (six) hours as needed for wheezing or shortness of breath.    [provider]  ambrisentan  (LETAIRIS ) 5 MG tablet Take 5 mg by mouth daily. 02/21/21   [provider]  aspirin  EC 81 MG tablet Take 81 mg by mouth at bedtime.     [provider]  azelastine  (ASTELIN ) 0.1 % nasal spray Place 1 spray into both nostrils 2 (two) times daily as needed for rhinitis or allergies. Use in each nostril as directed    [provider]  Calcium  500-100 MG-UNIT CHEW Chew 500 mg by mouth 2 (two) times daily. Calcium  is bariatric chewable with vitamin D included    [provider]  clobetasol  ointment (TEMOVATE ) 0.05 % Apply 1 Application topically 2 (two) times daily. 07/27/23   [provider]  ferrous sulfate  325 (65 FE) MG tablet Take 325 mg by mouth daily with breakfast.    [provider]  Fluticasone -Umeclidin-Vilant (TRELEGY ELLIPTA) 100-62.5-25 MCG/ACT AEPB Inhale 1 puff into the lungs daily. 01/24/21   [provider]  ipratropium (ATROVENT ) 0.03 % nasal spray Place 2 sprays into both nostrils 3 (three) times daily as needed for rhinitis.     [provider]  LOKELMA  5 g packet Take by mouth. 08/05/23   [provider]  LYRICA  200 MG capsule Take 200 mg by mouth 3 (three) times daily.     [provider]  mometasone (ELOCON) 0.1 % lotion 1-4 drops See admin instructions. 1-4 drops in each ear as needed for itching 08/19/21   [provider]  Multiple Vitamins-Minerals (BARIATRIC MULTIVITAMINS/IRON  PO) Take 1 tablet by mouth daily.    [provider]  nitroGLYCERIN (NITROSTAT) 0.4 MG SL tablet Place 0.4 mg under the tongue every 5 (five) minutes as needed for chest pain.  10/21/12   [provider]  OXYGEN Inhale 2 L into the lungs at bedtime.    [provider]  polyethylene glycol (MIRALAX  / GLYCOLAX ) 17 g packet Take by mouth. 04/09/23    [provider]  QUEtiapine  (SEROQUEL ) 25 MG tablet Take 25 mg by mouth at bedtime. 05/01/23   [provider]  rOPINIRole  (REQUIP  XL) 4 MG 24 hr tablet Take 4 mg by mouth at bedtime.    [provider]  rosuvastatin  (CRESTOR ) 5 MG tablet Take 5 mg by mouth every evening. 08/22/17   [provider]  tamsulosin  (FLOMAX ) 0.4 MG CAPS capsule Take 1 capsule (0.4 mg total) by mouth daily. 09/10/23   Vaillancourt, Samantha, PA-C   I have personally, briefly reviewed patient's prior medical records in [place] [facility]  Objective: Blood pressure (!) 153/76, pulse (!) 124, temperature (!) 101.4 F (38.6 C), temperature source Axillary, resp. rate (!) 25, SpO2 100%.   Constitutional: NAD, calm, comfortable Respiratory: on bipap and breathing appears comfortable. Decreased breath sounds at bases. Mild inspiratory wheezing Cardiovascular: RRR, no murmurs / rubs / gallops. No extremity edema. 2+ pedal pulses. no clubbing / cyanosis.  Abdomen: soft, NT, ND, no masses or HSM palpated. Musculoskeletal: No joint deformity upper and lower extremities. Normal muscle tone.  Skin: dry, intact, normal color, normal temperature on exposed skin Neurologic: Alert and oriented x 3. Grossly non-focal exam. PERRL Psychiatric: Normal mood. Congruent affect.  Labs on Admission: I have personally reviewed admission labs and imaging studies  CBC    Component Value Date/Time   WBC 9.6 03/02/2024 1849   RBC 3.70 (L) 03/02/2024 1849   HGB 11.0 (L) 03/02/2024 1849   HGB 9.7 (L) 02/01/2024 0935   HCT 36.2 (L) 03/02/2024 1849   PLT 115 (L) 03/02/2024 1849   PLT 123 (L) 02/01/2024 0935   MCV 97.8 03/02/2024 1849   MCH 29.7 03/02/2024 1849   MCHC 30.4 03/02/2024 1849   RDW 15.5 03/02/2024 1849  LYMPHSABS 1.3 03/02/2024 1849   MONOABS 0.7 03/02/2024 1849   EOSABS 0.4 03/02/2024 1849   BASOSABS 0.0 03/02/2024 1849   CMP     Component Value Date/Time   NA 138 03/02/2024 1849    K 5.1 03/02/2024 1849   CL 102 03/02/2024 1849   CO2 27 03/02/2024 1849   GLUCOSE 187 (H) 03/02/2024 1849   BUN 29 (H) 03/02/2024 1849   CREATININE 1.35 (H) 03/02/2024 1849   CALCIUM  8.4 (L) 03/02/2024 1849   PROT 7.1 03/02/2024 1849   ALBUMIN 3.7 03/02/2024 1849   AST 33 03/02/2024 1849   ALT 20 03/02/2024 1849   ALKPHOS 83 03/02/2024 1849   BILITOT 0.7 03/02/2024 1849   GFRNONAA 53 (L) 03/02/2024 1849   GFRAA >60 01/12/2018 1450    Radiological Exams on Admission: DG Chest Port 1 View Result Date: 03/02/2024 CLINICAL DATA:  Question of sepsis to evaluate for abnormality. EXAM: PORTABLE CHEST 1 VIEW COMPARISON:  CT chest 12/23/2023 FINDINGS: Shallow inspiration. Cardiac enlargement with pulmonary vascular congestion. Perihilar and basilar airspace disease likely representing edema but could also indicate pneumonia or aspiration. Small pleural effusions, greater on the right. No pneumothorax. Mediastinal contours appear intact. Calcification of the aorta. Patient rotation limits examination. IMPRESSION: Cardiac enlargement with pulmonary vascular congestion, perihilar edema, and bilateral pleural effusions. Electronically Signed   By: Elsie Gravely M.D.   On: 03/02/2024 19:10   EKG: Independently reviewed. A flutter. HR 123  DVT prophylaxis: enoxaparin  (LOVENOX ) injection 40 mg Start: 03/02/24 2015  Code Status: DNR- as discussed with wife  Family Communication: wife at bedside   Disposition Plan: admit to stepdown   Consults called: none    Marien LITTIE Piety, DO [facility]  03/02/2024, 8:12 PM    To contact the appropriate [facility] Attending or Consulting provider: Check [email or web address] for coverage from 7pm-7am

## 2024-03-03 ENCOUNTER — Encounter: Payer: Self-pay | Admitting: Student in an Organized Health Care Education/Training Program

## 2024-03-03 DIAGNOSIS — G2581 Restless legs syndrome: Secondary | ICD-10-CM | POA: Insufficient documentation

## 2024-03-03 DIAGNOSIS — J9621 Acute and chronic respiratory failure with hypoxia: Secondary | ICD-10-CM

## 2024-03-03 DIAGNOSIS — E875 Hyperkalemia: Secondary | ICD-10-CM | POA: Diagnosis not present

## 2024-03-03 DIAGNOSIS — N1831 Chronic kidney disease, stage 3a: Secondary | ICD-10-CM

## 2024-03-03 DIAGNOSIS — E785 Hyperlipidemia, unspecified: Secondary | ICD-10-CM

## 2024-03-03 DIAGNOSIS — N138 Other obstructive and reflux uropathy: Secondary | ICD-10-CM

## 2024-03-03 DIAGNOSIS — N401 Enlarged prostate with lower urinary tract symptoms: Secondary | ICD-10-CM

## 2024-03-03 DIAGNOSIS — F419 Anxiety disorder, unspecified: Secondary | ICD-10-CM

## 2024-03-03 DIAGNOSIS — A419 Sepsis, unspecified organism: Secondary | ICD-10-CM | POA: Diagnosis not present

## 2024-03-03 DIAGNOSIS — R6520 Severe sepsis without septic shock: Secondary | ICD-10-CM

## 2024-03-03 DIAGNOSIS — D696 Thrombocytopenia, unspecified: Secondary | ICD-10-CM

## 2024-03-03 DIAGNOSIS — J189 Pneumonia, unspecified organism: Secondary | ICD-10-CM | POA: Diagnosis not present

## 2024-03-03 DIAGNOSIS — E66813 Obesity, class 3: Secondary | ICD-10-CM

## 2024-03-03 DIAGNOSIS — F32A Depression, unspecified: Secondary | ICD-10-CM | POA: Insufficient documentation

## 2024-03-03 LAB — RESPIRATORY PANEL BY PCR

## 2024-03-03 LAB — BLOOD GAS, VENOUS
Bicarbonate: 29.8 mmol/L — AB (ref 20.0–28.0)
O2 Saturation: 22.9 % (ref 0.0–2.0)
Patient temperature: 37
Patient temperature: 37 %
pCO2, Ven: 68 mmHg — ABNORMAL HIGH (ref 44–60)
pH, Ven: 7.25 (ref 7.25–7.43)
pO2, Ven: <31 mmHg — CL (ref 32–45)

## 2024-03-03 LAB — HEPATIC FUNCTION PANEL
ALT: 21 U/L (ref 0–44)
AST: 31 U/L (ref 15–41)
Albumin: 3.1 g/dL — ABNORMAL LOW (ref 3.5–5.0)
Alkaline Phosphatase: 56 U/L (ref 38–126)
Bilirubin, Direct: <0.1 mg/dL (ref 0.0–0.2)
Total Bilirubin: 0.6 mg/dL (ref 0.0–1.2)
Total Protein: 6.4 g/dL — ABNORMAL LOW (ref 6.5–8.1)

## 2024-03-03 LAB — COMPREHENSIVE METABOLIC PANEL WITH GFR
ALT: 21 U/L (ref 0–44)
AST: 28 U/L (ref 15–41)
Albumin: 3.2 g/dL — ABNORMAL LOW (ref 3.5–5.0)
Alkaline Phosphatase: 58 U/L (ref 38–126)
Anion gap: 10 (ref 5–15)
BUN: 28 mg/dL — ABNORMAL HIGH (ref 8–23)
CO2: 27 mmol/L (ref 22–32)
Calcium: 8.3 mg/dL — ABNORMAL LOW (ref 8.9–10.3)
Chloride: 102 mmol/L (ref 98–111)
Creatinine, Ser: 1.5 mg/dL — ABNORMAL HIGH (ref 0.61–1.24)
GFR, Estimated: 47 mL/min — ABNORMAL LOW (ref >60)
Glucose, Bld: 213 mg/dL — ABNORMAL HIGH (ref 70–99)
Potassium: 5.7 mmol/L — ABNORMAL HIGH (ref 3.5–5.1)
Sodium: 139 mmol/L (ref 135–145)
Total Bilirubin: 0.5 mg/dL (ref 0.0–1.2)
Total Protein: 6.4 g/dL — ABNORMAL LOW (ref 6.5–8.1)

## 2024-03-03 LAB — GLUCOSE, CAPILLARY: Glucose-Capillary: 187 mg/dL — ABNORMAL HIGH (ref 70–99)

## 2024-03-03 LAB — CBC
HCT: 32.6 % — ABNORMAL LOW (ref 39.0–52.0)
Hemoglobin: 10.2 g/dL — ABNORMAL LOW (ref 13.0–17.0)
MCH: 30.2 pg (ref 26.0–34.0)
MCHC: 31.3 g/dL (ref 30.0–36.0)
MCV: 96.4 fL (ref 80.0–100.0)
Platelets: 96 K/uL — ABNORMAL LOW (ref 150–400)
RBC: 3.38 MIL/uL — ABNORMAL LOW (ref 4.22–5.81)
RDW: 15.5 % (ref 11.5–15.5)
WBC: 16.1 K/uL — ABNORMAL HIGH (ref 4.0–10.5)
nRBC: 0 % (ref 0.0–0.2)

## 2024-03-03 LAB — ECHOCARDIOGRAM COMPLETE
Area-P 1/2: 3.6 cm2
S' Lateral: 2.9 cm

## 2024-03-03 LAB — MRSA NEXT GEN BY PCR, NASAL: MRSA by PCR Next Gen: NOT DETECTED

## 2024-03-03 MED ORDER — AMBRISENTAN 5 MG PO TABS
5.0000 mg | ORAL_TABLET | Freq: Every day | ORAL | Status: DC
Start: 2024-03-03 — End: 2024-03-04
  Administered 2024-03-03 – 2024-03-04 (×2): 5 mg via ORAL
  Filled 2024-03-03 (×2): qty 1

## 2024-03-03 MED ORDER — BUDESON-GLYCOPYRROL-FORMOTEROL 160-9-4.8 MCG/ACT IN AERO
2.0000 | INHALATION_SPRAY | Freq: Two times a day (BID) | RESPIRATORY_TRACT | Status: DC
  Administered 2024-03-03 – 2024-03-04 (×3): 2 via RESPIRATORY_TRACT
  Filled 2024-03-03: qty 5.9

## 2024-03-03 MED ORDER — SODIUM ZIRCONIUM CYCLOSILICATE 5 G PO PACK
10.0000 g | PACK | Freq: Once | ORAL | Status: AC
  Administered 2024-03-03: 10 g via ORAL
  Filled 2024-03-03: qty 2

## 2024-03-03 MED ORDER — DULOXETINE HCL 30 MG PO CPEP
90.0000 mg | ORAL_CAPSULE | Freq: Every day | ORAL | Status: DC
  Administered 2024-03-03 – 2024-03-04 (×2): 90 mg via ORAL
  Filled 2024-03-03 (×2): qty 3

## 2024-03-03 MED ORDER — ROSUVASTATIN CALCIUM 5 MG PO TABS
5.0000 mg | ORAL_TABLET | Freq: Every evening | ORAL | Status: DC
  Administered 2024-03-03: 5 mg via ORAL
  Filled 2024-03-03: qty 1

## 2024-03-03 MED ORDER — IPRATROPIUM BROMIDE 0.03 % NA SOLN: 2.0000 | Freq: Three times a day (TID) | NASAL | Status: DC | PRN

## 2024-03-03 MED ORDER — ASPIRIN 81 MG PO TBEC
81.0000 mg | DELAYED_RELEASE_TABLET | Freq: Every day | ORAL | Status: DC
Start: 2024-03-03 — End: 2024-03-04
  Administered 2024-03-03: 81 mg via ORAL
  Filled 2024-03-03: qty 1

## 2024-03-03 MED ORDER — DEXTROSE 50 % IV SOLN
25.0000 g | Freq: Once | INTRAVENOUS | Status: AC
  Administered 2024-03-03: 25 g via INTRAVENOUS
  Filled 2024-03-03: qty 50

## 2024-03-03 MED ORDER — SODIUM BICARBONATE 8.4 % IV SOLN
25.0000 meq | Freq: Once | INTRAVENOUS | Status: AC
  Administered 2024-03-03: 25 meq via INTRAVENOUS
  Filled 2024-03-03: qty 50

## 2024-03-03 MED ORDER — INSULIN ASPART 100 UNIT/ML IV SOLN
10.0000 [IU] | Freq: Once | INTRAVENOUS | Status: AC
  Administered 2024-03-03: 10 [IU] via INTRAVENOUS
  Filled 2024-03-03: qty 0.1

## 2024-03-03 MED ORDER — PREGABALIN 50 MG PO CAPS
200.0000 mg | ORAL_CAPSULE | Freq: Three times a day (TID) | ORAL | Status: DC
  Administered 2024-03-03 – 2024-03-04 (×4): 200 mg via ORAL
  Filled 2024-03-03 (×4): qty 1

## 2024-03-03 MED ORDER — ROPINIROLE HCL ER 4 MG PO TB24
4.0000 mg | ORAL_TABLET | Freq: Every day | ORAL | Status: DC
  Administered 2024-03-03: 4 mg via ORAL
  Filled 2024-03-03 (×2): qty 1

## 2024-03-03 MED ORDER — CALCIUM GLUCONATE-NACL 1-0.675 GM/50ML-% IV SOLN
1.0000 g | Freq: Once | INTRAVENOUS | Status: AC
  Administered 2024-03-03: 1000 mg via INTRAVENOUS
  Filled 2024-03-03: qty 50

## 2024-03-03 MED ORDER — FINASTERIDE 5 MG PO TABS
5.0000 mg | ORAL_TABLET | Freq: Every day | ORAL | Status: DC
  Administered 2024-03-03 – 2024-03-04 (×2): 5 mg via ORAL
  Filled 2024-03-03 (×2): qty 1

## 2024-03-03 MED ORDER — TAMSULOSIN HCL 0.4 MG PO CAPS
0.4000 mg | ORAL_CAPSULE | Freq: Every day | ORAL | Status: DC
  Administered 2024-03-03 – 2024-03-04 (×2): 0.4 mg via ORAL
  Filled 2024-03-03 (×2): qty 1

## 2024-03-03 NOTE — Assessment & Plan Note (Signed)
 On Crestor 

## 2024-03-03 NOTE — Assessment & Plan Note (Signed)
 Chronic in nature but likely lower secondary to sepsis.

## 2024-03-03 NOTE — Assessment & Plan Note (Signed)
On Requip

## 2024-03-03 NOTE — Assessment & Plan Note (Signed)
 Patient received Rocephin  and Zithromax  here.  Will switch over to Omnicef  and doxycycline  upon going home

## 2024-03-03 NOTE — Progress Notes (Signed)
 Monitoring by Pharmacy for Pulmonary Hypertension Treatment   Indication - Continuation of prior to admission medication   Patient is 78 y.o.  with history of PAH on chronic ambrisentan  (LETAIRIS ) PTA and will be continued while hospitalized.   Chronic therapy is under the supervision of VEAR Servant, MD who is enrolled in the [facility] program  On admission pregnancy risk has been assessed and Patient is male - no monitoring required Hepatic function has been evaluated. AST / ALT appropriate to continue medication at this time.     Latest Ref Rng & Units 03/03/2024    5:52 AM 03/02/2024    6:49 PM 07/30/2023    6:36 AM  Hepatic Function  Total Protein 6.5 - 8.1 g/dL 6.5 - 8.1 g/dL 6.4    6.4  7.1  7.4   Albumin 3.5 - 5.0 g/dL 3.5 - 5.0 g/dL 3.2    3.1  3.7  3.6   AST 15 - 41 U/L 15 - 41 U/L 28    31  33  27   ALT 0 - 44 U/L 0 - 44 U/L 21    21  20  16    Alk Phosphatase 38 - 126 U/L 38 - 126 U/L 58    56  83  70   Total Bilirubin 0.0 - 1.2 mg/dL 0.0 - 1.2 mg/dL 0.5    0.6  0.7  0.6   Bilirubin, Direct 0.0 - 0.2 mg/dL <9.8       Thank for you allowing us  to participate in the care of this patient.  Brian Kramer 03/03/2024, 10:51 AM Clinical Pharmacist  Guides for Male Patient:  ambrisentan  (LETAIRIS ), macitentan (OPSUMIT), bosentan (TRACLEER).

## 2024-03-03 NOTE — Assessment & Plan Note (Signed)
 On Seroquel  and duloxetine 

## 2024-03-03 NOTE — Assessment & Plan Note (Signed)
 Treated with Lokelma , bicarb, calcium , insulin  and D50.

## 2024-03-03 NOTE — Assessment & Plan Note (Signed)
 Last creatinine 1.5 with a GFR 47

## 2024-03-03 NOTE — Plan of Care (Signed)

## 2024-03-03 NOTE — Evaluation (Signed)
 Physical Therapy Evaluation Patient Details Name: Brian Kramer MRN: [id number] DOB: [birth year] Today's Date: 03/03/2024  History of Present Illness  Patient is a 78 year old male with sepsis, acute on chronic hypoxic respiratory failure. EFY:mzdepmjunmb failure on 2 L nasal cannula at baseline, PAD, CAD, HLD, pulmonary hypertension, depression, dementia, CKD stage IIIa, BPH, IDA.  Clinical Impression  Patient is agreeable to PT evaluation. He lives at home with spouse and is ambulatory in the home. DME in place already. Supportive daughter at the bedside. Patient agreeable to get OOB to chair. No physical assistance needed for bed mobility or stand step transfer using rolling walker. Sp02 in the mid 90's after mobilizing. Patient reports feeling close to his baseline. Recommend PT follow up to maximize independence and facilitate return to prior level of function.       If plan is discharge home, recommend the following: Assistance with cooking/housework;Help with stairs or ramp for entrance   Can travel by private vehicle        Equipment Recommendations None recommended by PT  Recommendations for Other Services       Functional Status Assessment Patient has had a recent decline in their functional status and demonstrates the ability to make significant improvements in function in a reasonable and predictable amount of time.     Precautions / Restrictions Precautions Precautions: Fall Recall of Precautions/Restrictions: Impaired Restrictions Weight Bearing Restrictions Per Provider Order: No      Mobility  Bed Mobility Overal bed mobility: Needs Assistance Bed Mobility: Supine to Sit     Supine to sit: Supervision     General bed mobility comments: increased time, no physical assistance required    Transfers Overall transfer level: Needs assistance Equipment used: Rolling walker (2 wheels) Transfers: Bed to chair/wheelchair/BSC     Step pivot transfers:  Supervision       General transfer comment: no physical assistance required.    Ambulation/Gait               General Gait Details: not attempted  Stairs            Wheelchair Mobility     Tilt Bed    Modified Rankin (Stroke Patients Only)       Balance Overall balance assessment: Needs assistance Sitting-balance support: Feet supported Sitting balance-Leahy Scale: Good     Standing balance support: Bilateral upper extremity supported Standing balance-Leahy Scale: Fair Standing balance comment: using rolling walker for support in standing                             Pertinent Vitals/Pain Pain Assessment Pain Assessment: No/denies pain    Home Living Family/patient expects to be discharged to:: Private residence Living Arrangements: Spouse/significant other Available Help at Discharge: Family;Available 24 hours/day Type of Home: House Home Access: Ramped entrance       Home Layout: One level Home Equipment: Cane - single point;Crutches;Shower seat;Rollator (4 wheels);Wheelchair - manual;Wheelchair - power;Grab bars - tub/shower;Grab bars - toilet;Lift chair;Rolling Walker (2 wheels) Additional Comments: has pulse ox in each room of the home    Prior Function Prior Level of Function : Needs assist;History of Falls (last six months)             Mobility Comments: several falls reported. typically uses SPC for short distances and rollator for longer distances. Uses electric WC for community and appointments (from prior visit) ADLs Comments: has assistance if needed  Extremity/Trunk Assessment   Upper Extremity Assessment Upper Extremity Assessment: Overall WFL for tasks assessed    Lower Extremity Assessment Lower Extremity Assessment: Generalized weakness       Communication   Communication Communication: No apparent difficulties    Cognition Arousal: Alert Behavior During Therapy: WFL for tasks assessed/performed    PT - Cognitive impairments: History of cognitive impairments                         Following commands: Intact       Cueing Cueing Techniques: Verbal cues     General Comments General comments (skin integrity, edema, etc.): Sp02 in the mid 90's in the chair. unable to get accurate reading during mobility efforts. mild dyspnea with exertion    Exercises     Assessment/Plan    PT Assessment Patient needs continued PT services  PT Problem List Cardiopulmonary status limiting activity;Decreased strength;Decreased activity tolerance;Decreased balance;Decreased mobility;Decreased cognition       PT Treatment Interventions DME instruction;Gait training;Stair training;Functional mobility training;Therapeutic activities;Therapeutic exercise;Balance training;Neuromuscular re-education;Patient/family education    PT Goals (Current goals can be found in the Care Plan section)  Acute Rehab PT Goals Patient Stated Goal: home soon PT Goal Formulation: With patient Time For Goal Achievement: 03/17/24 Potential to Achieve Goals: Good    Frequency Min 2X/week     Co-evaluation               AM-PAC PT 6 Clicks Mobility  Outcome Measure Help needed turning from your back to your side while in a flat bed without using bedrails?: None Help needed moving from lying on your back to sitting on the side of a flat bed without using bedrails?: A Little Help needed moving to and from a bed to a chair (including a wheelchair)?: A Little Help needed standing up from a chair using your arms (e.g., wheelchair or bedside chair)?: A Little Help needed to walk in hospital room?: A Little Help needed climbing 3-5 steps with a railing? : A Little 6 Click Score: 19    End of Session Equipment Utilized During Treatment: Oxygen Activity Tolerance: Patient tolerated treatment well Patient left: in chair;with call bell/phone within reach;with family/visitor present Nurse Communication:  Mobility status PT Visit Diagnosis: Muscle weakness (generalized) (M62.81);Unsteadiness on feet (R26.81)    Time: 9044-8984 PT Time Calculation (min) (ACUTE ONLY): 20 min   Charges:   PT Evaluation $PT Eval Moderate Complexity: 1 Mod   PT General Charges $$ ACUTE PT VISIT: 1 Visit         Randine Essex, PT, MPT   Randine LULLA Essex 03/03/2024, 10:57 AM

## 2024-03-03 NOTE — Progress Notes (Signed)
 Progress Note   Patient: Brian Kramer FMW:[id number] DOB: Feb 25, 1946 DOA: 03/02/2024     1 DOS: the patient was seen and examined on 03/03/2024   Brief hospital course: 78 y.o. male with a PMH significant for chronic respiratory failure on 2 L nasal cannula at baseline, PAD, CAD, HLD, pulmonary hypertension, depression, dementia, CKD stage IIIa, BPH, IDA. At baseline, they live at home with their wife and are independent with their ADLs. For short distances at home does not use any assistive devices but outside the whole uses cane, walker, or electric chair.    They presented from home to the ED on 03/02/2024 with respiratory distress x1 days. Wife provides most of the history as patient is on bipap at time of encounter. He is not in acute distress. Pt was in normal state of health until about noon today when he began to notice he was short of breath with minimal exertion. He did try his home albuterol  inhaler without any improvement so they called EMS. He had reported hypoxia. Denies diaphoresis or chest pain. Wife states that he has had a productive cough recently. Denies acute LE swelling.  He wears 2Lnc at baseline.    In the ED, it was found that they had increased WOB and was placed on bipap. He had a fever of 101.4, tachycardia to 124, and tachypnea to 20. BP was stable at 153/76 Significant findings included: VBG: pH7.25, pCO2 68, pO2<31, bicarb 29.8 prior to bipap placement. LA 2.1. Na+ 138, K+ 5.1, glucose 187, Cr 1.35 (at baseline). WBC 9.6, hgb 11, platelets 115 (baseline). BNP 58.1 Chest xray: cardiac enlargement with pulmonary vascular congestion, perihilar edema, small bilateral pleural effusions Chest CTA: negative for PE. Consistent with multifocal pna   They were initially treated with duonebs, cefepime , LR, NS, vancomycin .    Patient was admitted to medicine service for further workup and management of sepsis as outlined in detail below.  9/25.  Potassium elevated 5.7  this was treated with Lokelma , calcium , sodium bicarb, insulin  and D50.  Patient having trouble urinating.  Will start Flomax  and try to get patient up to a commode.  In-N-Out catheterizations if needed.  Assessment and Plan: * Severe sepsis (HCC) Present on admission with fever, tachycardia, tachypnea, elevated lactic acid and acute on chronic hypoxic respiratory failure.  Patient required BiPAP initially on presentation.  CT scan showing multifocal pneumonia.  Today back down to his normal nasal cannula 3 L.  Multifocal pneumonia Continue Rocephin  and Zithromax   Acute on chronic respiratory failure with hypoxia (HCC) Required BiPAP on presentation for respiratory distress.  Currently back on 3 L of oxygen.  Wears this chronically.  Uses CPAP at night.  Hyperkalemia Treated with Lokelma , bicarb, calcium , insulin  and D50.  BPH with obstruction/lower urinary tract symptoms Restart Flomax .  Try to get up to commode to urinate.  If unable to urinate may end up needing straight catheterizations.  Obesity, Class III, BMI 40-49.9 (morbid obesity) BMI 41.88  Anxiety and depression On Seroquel  and duloxetine   Restless leg syndrome On Requip   Thrombocytopenia Chronic in nature but likely lower secondary to sepsis.  CKD stage 3a, GFR 45-59 ml/min (HCC) Last creatinine 1.5 with a GFR 47  Hyperlipidemia, unspecified On Crestor         Subjective: Patient feeling a little bit better than when he came in.  Feeling weak.  Having difficulty urinating.  Had high potassium this morning and we are treating this.  Some cough.  Admitted with sepsis and  pneumonia  Physical Exam: Vitals:   03/03/24 0900 03/03/24 1000 03/03/24 1100 03/03/24 1127  BP: (!) 106/48 (!) 137/46 (!) 107/48 (!) 117/52  Pulse: 64 65 70 76  Resp: (!) 21 16 13 16   Temp:    98.3 F (36.8 C)  TempSrc:      SpO2: 95% 100% 97% 92%  Weight:      Height:       Physical Exam HENT:     Head: Normocephalic.      Mouth/Throat:     Pharynx: No oropharyngeal exudate.  Eyes:     General: Lids are normal.     Conjunctiva/sclera: Conjunctivae normal.  Cardiovascular:     Rate and Rhythm: Normal rate and regular rhythm.     Heart sounds: Normal heart sounds, S1 normal and S2 normal.  Pulmonary:     Breath sounds: Examination of the right-lower field reveals decreased breath sounds. Examination of the left-lower field reveals decreased breath sounds. Decreased breath sounds present. No wheezing, rhonchi or rales.  Abdominal:     Palpations: Abdomen is soft.     Tenderness: There is no abdominal tenderness.  Musculoskeletal:     Right lower leg: Swelling present.     Left lower leg: Swelling present.  Skin:    General: Skin is warm.     Findings: No rash.  Neurological:     Mental Status: He is alert and oriented to person, place, and time.     Data Reviewed: Potassium 5.7, creatinine 1.5, lactic acid 2.1, white blood count 16.1, platelet count 96, hemoglobin 10.2  Family Communication: Daughter and wife at bedside  Disposition: Status is: Inpatient Remains inpatient appropriate because: Treating severe sepsis with IV antibiotics  Planned Discharge Destination: To be determined    Time spent: 28 minutes  Author: Charlie Patterson, MD 03/03/2024 11:48 AM  For on call review [email or web address].

## 2024-03-03 NOTE — Hospital Course (Signed)
 78 y.o. male with a PMH significant for chronic respiratory failure on 2 L nasal cannula at baseline, PAD, CAD, HLD, pulmonary hypertension, depression, dementia, CKD stage IIIa, BPH, IDA. At baseline, they live at home with their wife and are independent with their ADLs. For short distances at home does not use any assistive devices but outside the whole uses cane, walker, or electric chair.    They presented from home to the ED on 03/02/2024 with respiratory distress x1 days. Wife provides most of the history as patient is on bipap at time of encounter. He is not in acute distress. Pt was in normal state of health until about noon today when he began to notice he was short of breath with minimal exertion. He did try his home albuterol  inhaler without any improvement so they called EMS. He had reported hypoxia. Denies diaphoresis or chest pain. Wife states that he has had a productive cough recently. Denies acute LE swelling.  He wears 2Lnc at baseline.    In the ED, it was found that they had increased WOB and was placed on bipap. He had a fever of 101.4, tachycardia to 124, and tachypnea to 20. BP was stable at 153/76 Significant findings included: VBG: pH7.25, pCO2 68, pO2<31, bicarb 29.8 prior to bipap placement. LA 2.1. Na+ 138, K+ 5.1, glucose 187, Cr 1.35 (at baseline). WBC 9.6, hgb 11, platelets 115 (baseline). BNP 58.1 Chest xray: cardiac enlargement with pulmonary vascular congestion, perihilar edema, small bilateral pleural effusions Chest CTA: negative for PE. Consistent with multifocal pna   They were initially treated with duonebs, cefepime , LR, NS, vancomycin .    Patient was admitted to medicine service for further workup and management of sepsis as outlined in detail below.  9/25.  Potassium elevated 5.7 this was treated with Lokelma , calcium , sodium bicarb, insulin  and D50.  Patient having trouble urinating.  Will start Flomax  and try to get patient up to a commode.  In-N-Out  catheterizations if needed. 9/26.  Patient feeling better and wanting to go home.  Potassium 4.3.

## 2024-03-03 NOTE — Assessment & Plan Note (Signed)
BMI 41.88

## 2024-03-03 NOTE — Assessment & Plan Note (Signed)
 Present on admission with fever, tachycardia, tachypnea, elevated lactic acid and acute on chronic hypoxic respiratory failure.  Patient required BiPAP initially on presentation.  CT scan showing multifocal pneumonia.  Yesterday back down to his normal nasal cannula 3 L.

## 2024-03-03 NOTE — Assessment & Plan Note (Signed)
 Restart Flomax .  Try to get up to commode to urinate.  If unable to urinate may end up needing straight catheterizations.

## 2024-03-03 NOTE — Progress Notes (Signed)
 Anticoagulation monitoring(Lovenox ):  78 yo  male ordered Lovenox  40 mg Q24h    Filed Weights   03/03/24 0202  Weight: 117.7 kg (259 lb 7.7 oz)   BMI 41.9    Lab Results  Component Value Date   CREATININE 1.35 (H) 03/02/2024   CREATININE 1.41 (H) 08/01/2023   CREATININE 1.73 (H) 07/31/2023   Estimated Creatinine Clearance: 53.6 mL/min (A) (by C-G formula based on SCr of 1.35 mg/dL (H)). Hemoglobin & Hematocrit     Component Value Date/Time   HGB 11.0 (L) 03/02/2024 1849   HGB 9.7 (L) 02/01/2024 0935   HCT 36.2 (L) 03/02/2024 1849     Per Protocol for Patient with estCrcl > 30 ml/min and BMI > 30, will transition to Lovenox  60 mg Q24h.

## 2024-03-03 NOTE — Assessment & Plan Note (Signed)
 Required BiPAP on presentation for respiratory distress.  Currently back on 3 L of oxygen.  Wears this chronically.  Uses CPAP at night.

## 2024-03-04 DIAGNOSIS — J9612 Chronic respiratory failure with hypercapnia: Secondary | ICD-10-CM

## 2024-03-04 DIAGNOSIS — J9601 Acute respiratory failure with hypoxia: Secondary | ICD-10-CM

## 2024-03-04 DIAGNOSIS — J441 Chronic obstructive pulmonary disease with (acute) exacerbation: Secondary | ICD-10-CM

## 2024-03-04 LAB — BLOOD GAS, ARTERIAL
Acid-Base Excess: 3.5 mmol/L — ABNORMAL HIGH (ref 0.0–2.0)
Bicarbonate: 29.5 mmol/L — ABNORMAL HIGH (ref 20.0–28.0)
O2 Content: 3 L/min
O2 Saturation: 92.4 %
Patient temperature: 37
pCO2 arterial: 51 mmHg — ABNORMAL HIGH (ref 32–48)
pH, Arterial: 7.37 (ref 7.35–7.45)
pO2, Arterial: 58 mmHg — ABNORMAL LOW (ref 83–108)

## 2024-03-04 LAB — URINALYSIS, W/ REFLEX TO CULTURE (INFECTION SUSPECTED)
Bacteria, UA: NONE SEEN
Bilirubin Urine: NEGATIVE
Glucose, UA: NEGATIVE mg/dL
Hgb urine dipstick: NEGATIVE
Ketones, ur: NEGATIVE mg/dL
Leukocytes,Ua: NEGATIVE
Nitrite: NEGATIVE
Protein, ur: NEGATIVE mg/dL
Specific Gravity, Urine: 1.02 (ref 1.005–1.030)
pH: 5 (ref 5.0–8.0)

## 2024-03-04 LAB — CBC
HCT: 29 % — ABNORMAL LOW (ref 39.0–52.0)
Hemoglobin: 9 g/dL — ABNORMAL LOW (ref 13.0–17.0)
MCH: 29.5 pg (ref 26.0–34.0)
MCHC: 31 g/dL (ref 30.0–36.0)
MCV: 95.1 fL (ref 80.0–100.0)
Platelets: 99 K/uL — ABNORMAL LOW (ref 150–400)
RBC: 3.05 MIL/uL — ABNORMAL LOW (ref 4.22–5.81)
RDW: 15.4 % (ref 11.5–15.5)
WBC: 8.4 K/uL (ref 4.0–10.5)
nRBC: 0 % (ref 0.0–0.2)

## 2024-03-04 LAB — BASIC METABOLIC PANEL WITH GFR
Anion gap: 9 (ref 5–15)
BUN: 35 mg/dL — ABNORMAL HIGH (ref 8–23)
CO2: 28 mmol/L (ref 22–32)
Calcium: 8.4 mg/dL — ABNORMAL LOW (ref 8.9–10.3)
Chloride: 102 mmol/L (ref 98–111)
Creatinine, Ser: 1.41 mg/dL — ABNORMAL HIGH (ref 0.61–1.24)
GFR, Estimated: 51 mL/min — ABNORMAL LOW (ref >60)
Glucose, Bld: 126 mg/dL — ABNORMAL HIGH (ref 70–99)
Potassium: 4.3 mmol/L (ref 3.5–5.1)
Sodium: 139 mmol/L (ref 135–145)

## 2024-03-04 MED ORDER — DOXYCYCLINE HYCLATE 100 MG PO TABS: 100.0000 mg | ORAL_TABLET | Freq: Two times a day (BID) | ORAL | Status: DC

## 2024-03-04 MED ORDER — CEFDINIR 300 MG PO CAPS: 300.0000 mg | ORAL_CAPSULE | Freq: Two times a day (BID) | ORAL | 0 refills | Status: AC

## 2024-03-04 MED ORDER — DOXYCYCLINE HYCLATE 100 MG PO TABS: 100.0000 mg | ORAL_TABLET | Freq: Two times a day (BID) | ORAL | 0 refills | Status: AC

## 2024-03-04 MED ORDER — FINASTERIDE 5 MG PO TABS: 5.0000 mg | ORAL_TABLET | Freq: Every day | ORAL | 0 refills | Status: AC

## 2024-03-04 MED ORDER — QUETIAPINE FUMARATE 25 MG PO TABS: 25.0000 mg | ORAL_TABLET | Freq: Every day | ORAL | Status: DC

## 2024-03-04 MED ORDER — PREDNISONE 20 MG PO TABS
40.0000 mg | ORAL_TABLET | Freq: Every day | ORAL | Status: DC
  Administered 2024-03-04: 40 mg via ORAL
  Filled 2024-03-04: qty 2

## 2024-03-04 MED ORDER — PREDNISONE 20 MG PO TABS: 40.0000 mg | ORAL_TABLET | Freq: Every day | ORAL | 0 refills | Status: AC

## 2024-03-04 NOTE — Plan of Care (Signed)

## 2024-03-04 NOTE — Evaluation (Signed)
 Occupational Therapy Evaluation Patient Details Name: Brian Kramer MRN: [id number] DOB: 1945-07-13 Today's Date: 03/04/2024   History of Present Illness   Patient is a 78 year old male with sepsis, acute on chronic hypoxic respiratory failure. EFY:mzdepmjunmb failure on 2 L nasal cannula at baseline, PAD, CAD, HLD, pulmonary hypertension, depression, dementia, CKD stage IIIa, BPH, IDA.     Clinical Impressions [name] was seen for OT evaluation this date. Prior to hospital admission, pt was required assistance on a PRN bases from his spouse/daughter for IADL management, and was generally independent for ADL management. Pt lives with his spouse in a 1 level home with a ramped entrance. Pt presents with deficits in strength, cardiopulmonary status, and activity tolerance, affecting safe and optimal ADL completion. Pt currently requires SUPERVISION for standing grooming at sink, CGA for safety with RW during functional transfers, and MIN-MOD A for LB ADL management.  Pt would benefit from skilled OT services to address noted impairments and functional limitations (see below for any additional details) in order to maximize safety and independence while minimizing future risk of falls, injury, and readmission. Anticipate the need for follow up OT services in the home setting upon acute hospital DC.      If plan is discharge home, recommend the following:   A little help with walking and/or transfers;A little help with bathing/dressing/bathroom;Direct supervision/assist for financial management;Assist for transportation;Assistance with cooking/housework;Help with stairs or ramp for entrance;Supervision due to cognitive status     Functional Status Assessment   Patient has had a recent decline in their functional status and demonstrates the ability to make significant improvements in function in a reasonable and predictable amount of time.     Equipment Recommendations   None  recommended by OT (Pt has necessary equipment)     Recommendations for Other Services         Precautions/Restrictions   Precautions Precautions: Fall Recall of Precautions/Restrictions: Impaired Restrictions Weight Bearing Restrictions Per Provider Order: No     Mobility Bed Mobility Overal bed mobility: Needs Assistance Bed Mobility: Supine to Sit     Supine to sit: Supervision     General bed mobility comments: increased time, no physical assistance required    Transfers Overall transfer level: Needs assistance Equipment used: Rolling walker (2 wheels) Transfers: Sit to/from Stand Sit to Stand: Supervision           General transfer comment: STS from EOB, able to take steps to/from room sink without assist.      Balance Overall balance assessment: Needs assistance Sitting-balance support: Feet supported Sitting balance-Leahy Scale: Good Sitting balance - Comments: steady static sitting, reaching inside BOS.   Standing balance support: Bilateral upper extremity supported Standing balance-Leahy Scale: Fair Standing balance comment: using rolling walker for support in standing                           ADL either performed or assessed with clinical judgement   ADL Overall ADL's : Needs assistance/impaired                                       General ADL Comments: MOD A for LB ADL management, CGA for safety with standing grooming at sink. Educated on safe use of AE/DME for ADL management, energy conservation strategies, and DC recs     Vision Patient Visual Report: No  change from baseline       Perception         Praxis         Pertinent Vitals/Pain Pain Assessment Pain Assessment: No/denies pain     Extremity/Trunk Assessment Upper Extremity Assessment Upper Extremity Assessment: Overall WFL for tasks assessed;Generalized weakness   Lower Extremity Assessment Lower Extremity Assessment: Generalized  weakness   Cervical / Trunk Assessment Cervical / Trunk Assessment: Kyphotic   Communication Communication Communication: No apparent difficulties   Cognition Arousal: Alert Behavior During Therapy: WFL for tasks assessed/performed Cognition: History of cognitive impairments                               Following commands: Intact       Cueing  General Comments   Cueing Techniques: Verbal cues;Gestural cues  SpO2 monitored t/o session, pt at 88% at rest on 3L [place], noted desat to 83% with functional transfer and standing grooming. O2 increased to 4L and pt quickly rebounds with cues for PLB/therapeutic rest break. Returned to 3L [place] with SpO2 at 93% at end of session.   Exercises Other Exercises Other Exercises: Pt/caregiver educated on role of OT in acute setting, safety, falls prevention, energy conservation strategies, safe use of AE/DME for ADL management and DC recs.   Shoulder Instructions      Home Living Family/patient expects to be discharged to:: Private residence Living Arrangements: Spouse/significant other Available Help at Discharge: Family;Available 24 hours/day Type of Home: House Home Access: Ramped entrance     Home Layout: One level     Bathroom Shower/Tub: Producer, television/film/video: Standard     Home Equipment: Cane - single point;Crutches;Shower seat;Rollator (4 wheels);Wheelchair - manual;Wheelchair - power;Grab bars - tub/shower;Grab bars - toilet;Lift chair;Rolling Walker (2 wheels)   Additional Comments: has pulse ox in each room of the home      Prior Functioning/Environment Prior Level of Function : Needs assist;History of Falls (last six months)             Mobility Comments: several falls reported. typically uses SPC for short distances and rollator for longer distances. Uses electric WC for community and appointments (from prior visit) ADLs Comments: Dtr/wife assist on PRN basis, tends to furniture surf for [facility]  mobility.    OT Problem List: Decreased strength;Decreased coordination;Decreased range of motion;Decreased activity tolerance;Decreased safety awareness;Impaired balance (sitting and/or standing);Decreased knowledge of use of DME or AE   OT Treatment/Interventions: Self-care/ADL training;Therapeutic exercise;Therapeutic activities;DME and/or AE instruction;Patient/family education;Balance training      OT Goals(Current goals can be found in the care plan section)   Acute Rehab OT Goals Patient Stated Goal: To go home OT Goal Formulation: With patient Time For Goal Achievement: 03/18/24 Potential to Achieve Goals: Good ADL Goals Pt Will Perform Grooming: with modified independence;standing Pt Will Perform Lower Body Dressing: sit to/from stand;with supervision;with set-up;with caregiver independent in assisting;with adaptive equipment Pt Will Transfer to Toilet: bedside commode;ambulating;with supervision;with set-up Pt Will Perform Toileting - Clothing Manipulation and hygiene: with adaptive equipment;with supervision;with set-up;with caregiver independent in assisting;sit to/from stand;sitting/lateral leans   OT Frequency:  Min 1X/week    Co-evaluation              AM-PAC OT 6 Clicks Daily Activity     Outcome Measure Help from another person eating meals?: None Help from another person taking care of personal grooming?: A Little Help from another person toileting,  which includes using toliet, bedpan, or urinal?: A Little Help from another person bathing (including washing, rinsing, drying)?: A Little Help from another person to put on and taking off regular upper body clothing?: A Little Help from another person to put on and taking off regular lower body clothing?: A Little 6 Click Score: 19   End of Session Equipment Utilized During Treatment: Gait belt;Rolling walker (2 wheels)  Activity Tolerance: Patient tolerated treatment well Patient left: in bed;with call  bell/phone within reach;with bed alarm set;with family/visitor present  OT Visit Diagnosis: Other abnormalities of gait and mobility (R26.89);Muscle weakness (generalized) (M62.81)                Time: 9083-9052 OT Time Calculation (min): 31 min Charges:  OT General Charges $OT Visit: 1 Visit OT Evaluation $OT Eval Moderate Complexity: 1 Mod OT Treatments $Self Care/Home Management : 8-22 mins  Jhonny Pelton, M.S., OTR/L 03/04/24, 1:01 PM

## 2024-03-04 NOTE — Discharge Summary (Addendum)
 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 *  Hospital Course: 78 y.o. male with a PMH significant for chronic respiratory failure on 2 L nasal cannula at baseline, PAD, CAD, HLD, pulmonary hypertension, depression, dementia, CKD stage IIIa, BPH, IDA. At baseline, they live at home with their wife and are independent with their ADLs. For short distances at home does not use any assistive devices but outside the whole uses cane, walker, or electric chair.    They presented from home to the ED on 03/02/2024 with respiratory distress x1 days. Wife provides most of the history as patient is on bipap at time of encounter. He is not in acute distress. Pt was in normal state of health until about noon today when he began to notice he was short of breath with minimal exertion. He did try his home albuterol  inhaler without any improvement so they called EMS. He had reported hypoxia. Denies diaphoresis or chest pain. Wife states that he has had a productive cough recently. Denies acute LE swelling.  He wears 2Lnc at baseline.    In the ED, it was found that they had increased WOB and was placed on bipap. He had a fever of 101.4, tachycardia to 124, and tachypnea  to 20. BP was stable at 153/76 Significant findings included: VBG: pH7.25, pCO2 68, pO2<31, bicarb 29.8 prior to bipap placement. LA 2.1. Na+ 138, K+ 5.1, glucose 187, Cr 1.35 (at baseline). WBC 9.6, hgb 11, platelets 115 (baseline). BNP 58.1 Chest xray: cardiac enlargement with pulmonary vascular congestion, perihilar edema, small bilateral pleural effusions Chest CTA: negative for PE. Consistent with multifocal pna   They were initially treated with duonebs, cefepime , LR, NS, vancomycin .    Patient was admitted to medicine service for further workup and management of sepsis as outlined in detail below.  9/25.  Potassium elevated 5.7 this was treated with Lokelma , calcium , sodium bicarb, insulin  and D50.  Patient having trouble urinating.  Will start Flomax  and try to get patient up to a commode.  In-N-Out catheterizations if needed. 9/26.  Patient feeling better and wanting to go home.  Potassium 4.3.  Assessment and Plan: * Severe sepsis (HCC) Present on admission with fever, tachycardia, tachypnea, elevated lactic acid and acute on chronic hypoxic respiratory failure.  Patient required BiPAP initially on presentation.  CT scan showing multifocal pneumonia.  Yesterday back down to his normal nasal cannula 3 L.  Multifocal pneumonia Patient received Rocephin  and Zithromax  here.  Will switch over to Omnicef  and doxycycline  upon going home  Acute hypoxic on chronic hypercapnic respiratory failure (HCC) COPD exacerbation.  Required BiPAP on presentation for respiratory distress and CO2 retention of 68 on venous blood gas.  Currently back on 3 L of oxygen.  Wears  this chronically.  Uses CPAP at night at home.  Here was on BiPAP.  ABG prior to discharge shows a pCO2 of 51.  Case discussed with transitional care team to try and get trilogy approved for nighttime to reduce hospitalizations by blowing off CO2.  Patient benefited by using BiPAP here.  Prednisone  and antibiotics prescribed upon  discharge  Patient requires noninvasive ventilation due to severe COPD consequent to chronic respiratory failure. OSA is not the cause of the patient's hypercapnia. Bilevel has been ruled out as insufficient due to lack of backup batteries, lack of alarms, and lack of guaranteed pressure support. Patient had been on bilevel settings during admission but required ventilatory support with higher pressure support to lower his pCO2 levels as seen in ABG results. Without noninvasive ventilation, the patient will likely be readmitted to the hospital for an exacerbation, experience risk of harm or death. Hyperkalemia Treated with Lokelma , bicarb, calcium , insulin  and D50 on 9/25.  Potassium 4.3 today.  Takes Lokelma  every other day at home.  BPH with obstruction/lower urinary tract symptoms Continue Flomax .  Up to commode to urinate  Obesity, Class III, BMI 40-49.9 (morbid obesity) BMI 41.88  Anxiety and depression On Seroquel  and duloxetine   Restless leg syndrome On Requip   Thrombocytopenia Chronic in nature but likely lower secondary to sepsis.  CKD stage 3a, GFR 45-59 ml/min (HCC) Last creatinine 1.41 with a GFR 51  Hyperlipidemia, unspecified On Crestor          Consultants: Was seen by critical care specialist on admission Procedures performed: None Disposition: Home health Diet recommendation:  Cardiac diet DISCHARGE MEDICATION: Allergies as of 03/04/2024       Reactions   Tape Other (See Comments)   Round, white EKG pads only - ate holes in my skin 1997 when stays on skin for longer period Any tape OK if it doesn't stay on too long   Penicillins Itching, Rash, Other (See Comments)   Has patient had a PCN reaction causing immediate rash, facial/tongue/throat swelling, SOB or lightheadedness with hypotension: Yes Has patient had a PCN reaction causing severe rash involving mucus membranes or skin necrosis: No Has patient had a PCN reaction that required hospitalization:  No Has patient had a PCN reaction occurring within the last 10 years: No If all of the above answers are NO, then may proceed with Cephalosporin use.   Shellfish Allergy Rash, Other (See Comments)   PT CAN NOT EAT CRAB or lobster, BUT CAN EAT shrimp. No problems with betadine  or iodine         Medication List     TAKE these medications    albuterol  108 (90 Base) MCG/ACT inhaler Commonly known as: VENTOLIN  HFA Inhale 2 puffs into the lungs every 6 (six) hours as needed for wheezing or shortness of breath.   ambrisentan  5 MG tablet Commonly known as: LETAIRIS  Take 5 mg by mouth daily.   aspirin  EC 81 MG tablet Take 81 mg by mouth at bedtime.   azelastine  0.1 % nasal spray Commonly known as: ASTELIN  Place 1 spray into both nostrils 2 (two) times daily as needed for rhinitis or allergies. Use in each nostril as directed   BARIATRIC MULTIVITAMINS/IRON  PO Take 1 tablet by mouth daily.   Calcium  500-100 MG-UNIT Chew Chew 500 mg by mouth 2 (two) times daily. Calcium  is bariatric chewable with vitamin D included   cefdinir  300 MG capsule Commonly known as: OMNICEF  Take 1 capsule (300 mg total) by mouth 2 (two) times daily for  3 days.   doxycycline  100 MG tablet Commonly known as: VIBRA -TABS Take 1 tablet (100 mg total) by mouth every 12 (twelve) hours for 3 days.   DULoxetine  30 MG capsule Commonly known as: CYMBALTA  Take 90 mg by mouth daily.   ferrous sulfate  325 (65 FE) MG tablet Take 325 mg by mouth daily with breakfast.   finasteride  5 MG tablet Commonly known as: PROSCAR  Take 1 tablet (5 mg total) by mouth daily. Start taking on: March 05, 2024   ipratropium 0.03 % nasal spray Commonly known as: ATROVENT  Place 2 sprays into both nostrils 3 (three) times daily as needed for rhinitis.   Lokelma  5 g packet Generic drug: sodium zirconium cyclosilicate  Take by mouth.   Lyrica  200 MG capsule Generic drug: pregabalin  Take 200 mg by mouth 3 (three) times  daily.   mometasone 0.1 % lotion Commonly known as: ELOCON 1-4 drops See admin instructions. 1-4 drops in each ear as needed for itching   nitroGLYCERIN 0.4 MG SL tablet Commonly known as: NITROSTAT Place 0.4 mg under the tongue every 5 (five) minutes as needed for chest pain.   OXYGEN Inhale 2 L into the lungs at bedtime.   polyethylene glycol 17 g packet Commonly known as: MIRALAX  / GLYCOLAX  Take by mouth.   predniSONE  20 MG tablet Commonly known as: DELTASONE  Take 2 tablets (40 mg total) by mouth daily with breakfast for 3 days.   QUEtiapine  25 MG tablet Commonly known as: SEROQUEL  Take 25 mg by mouth at bedtime.   rOPINIRole  4 MG 24 hr tablet Commonly known as: REQUIP  XL Take 4 mg by mouth at bedtime.   rosuvastatin  5 MG tablet Commonly known as: CRESTOR  Take 5 mg by mouth every evening.   tamsulosin  0.4 MG Caps capsule Commonly known as: FLOMAX  Take 1 capsule (0.4 mg total) by mouth daily.   Trelegy Ellipta 100-62.5-25 MCG/ACT Aepb Generic drug: Fluticasone -Umeclidin-Vilant Inhale 1 puff into the lungs daily.        Follow-up Information     Alla Amis, MD Follow up on 03/10/2024.   Specialty: Family Medicine Why: Appt @ 11:30 Contact information: [address] [facility] [place] [address] [phone]                Discharge Exam: Fredricka Weights   03/03/24 0202  Weight: 117.7 kg   Physical Exam HENT:     Head: Normocephalic.     Mouth/Throat:     Pharynx: No oropharyngeal exudate.  Eyes:     General: Lids are normal.     Conjunctiva/sclera: Conjunctivae normal.  Cardiovascular:     Rate and Rhythm: Normal rate and regular rhythm.     Heart sounds: Normal heart sounds, S1 normal and S2 normal.  Pulmonary:     Breath sounds: Examination of the right-lower field reveals decreased breath sounds and rhonchi. Examination of the left-lower field reveals decreased breath sounds and rhonchi. Decreased breath  sounds and rhonchi present. No wheezing or rales.  Abdominal:     Palpations: Abdomen is soft.     Tenderness: There is no abdominal tenderness.  Musculoskeletal:     Right lower leg: Swelling present.     Left lower leg: Swelling present.  Skin:    General: Skin is warm.     Findings: No rash.  Neurological:     Mental Status: He is alert and oriented to person, place, and time.      Condition at discharge: stable  The results  of significant diagnostics from this hospitalization (including imaging, microbiology, ancillary and laboratory) are listed below for reference.   Imaging Studies: ECHOCARDIOGRAM COMPLETE Result Date: 03/03/2024    ECHOCARDIOGRAM REPORT   Patient Name:   DOYL BITTING Check Date of Exam: 03/02/2024 Medical Rec #:  [id number]           Height:       66.0 in Accession #:    [id number]          Weight:       262.9 lb Date of Birth:  1946/03/01           BSA:          2.246 m Patient Age:    78 years            BP:           153/76 mmHg Patient Gender: M                   HR:           91 bpm. Exam Location:  [facility] Procedure: 2D Echo, Cardiac Doppler and Color Doppler (Both Spectral and Color            Flow Doppler were utilized during procedure). Indications:     I27.2 Pulmonary hypertension  History:         Patient has prior history of Echocardiogram examinations, most                  recent 01/26/2020. CAD and Previous Myocardial Infarction, COPD;                  Risk Factors:Hypertension, Diabetes and Dyslipidemia. Pulmonary                  hypertension. Obstructive sleep apnea- CPAP. Chronic kidney                  disease.  Sonographer:     Carl Coma RDCS Referring Phys:  [id number] MARIEN LITTIE PIETY Diagnosing Phys: Keller Alluri IMPRESSIONS  1. Left ventricular ejection fraction, by estimation, is 55 to 60%. The left ventricle has normal function. The left ventricle has no regional wall motion abnormalities. There is mild left ventricular hypertrophy.  Left ventricular diastolic parameters are consistent with Grade I diastolic dysfunction (impaired relaxation).  2. Right ventricular systolic function is normal. The right ventricular size is mildly enlarged. Tricuspid regurgitation signal is inadequate for assessing PA pressure.  3. The mitral valve is normal in structure. No evidence of mitral valve regurgitation.  4. The aortic valve is tricuspid. Aortic valve regurgitation is not visualized. FINDINGS  Left Ventricle: Left ventricular ejection fraction, by estimation, is 55 to 60%. The left ventricle has normal function. The left ventricle has no regional wall motion abnormalities. The left ventricular internal cavity size was normal in size. There is  mild left ventricular hypertrophy. Left ventricular diastolic parameters are consistent with Grade I diastolic dysfunction (impaired relaxation). Right Ventricle: The right ventricular size is mildly enlarged. No increase in right ventricular wall thickness. Right ventricular systolic function is normal. Tricuspid regurgitation signal is inadequate for assessing PA pressure. Left Atrium: Left atrial size was normal in size. Right Atrium: Right atrial size was normal in size. Pericardium: There is no evidence of pericardial effusion. Mitral Valve: The mitral valve is normal in structure. No evidence of mitral valve regurgitation. Tricuspid Valve: The tricuspid valve is normal in structure. Tricuspid valve regurgitation is trivial. Aortic Valve:  The aortic valve is tricuspid. Aortic valve regurgitation is not visualized. Pulmonic Valve: The pulmonic valve was not well visualized. Pulmonic valve regurgitation is not visualized. Aorta: The aortic root and ascending aorta are structurally normal, with no evidence of dilitation. IAS/Shunts: The atrial septum is grossly normal.  LEFT VENTRICLE PLAX 2D LVIDd:         4.70 cm   Diastology LVIDs:         2.90 cm   LV e' medial:    8.77 cm/s LV PW:         1.20 cm   LV E/e'  medial:  7.4 LV IVS:        0.80 cm   LV e' lateral:   14.70 cm/s LVOT diam:     2.10 cm   LV E/e' lateral: 4.4 LV SV:         92 LV SV Index:   41 LVOT Area:     3.46 cm  RIGHT VENTRICLE             IVC RV Basal diam:  4.20 cm     IVC diam: 2.20 cm RV S prime:     18.95 cm/s TAPSE (M-mode): 2.7 cm LEFT ATRIUM             Index        RIGHT ATRIUM           Index LA diam:        4.80 cm 2.14 cm/m   RA Area:     18.60 cm LA Vol (A2C):   66.2 ml 29.47 ml/m  RA Volume:   54.60 ml  24.31 ml/m LA Vol (A4C):   75.5 ml 33.61 ml/m LA Biplane Vol: 73.3 ml 32.63 ml/m  AORTIC VALVE LVOT Vmax:   144.00 cm/s LVOT Vmean:  108.000 cm/s LVOT VTI:    0.265 m  AORTA Ao Root diam: 3.50 cm Ao Asc diam:  3.60 cm MITRAL VALVE MV Area (PHT): 3.60 cm     SHUNTS MV Decel Time: 211 msec     Systemic VTI:  0.26 m MV E velocity: 65.00 cm/s   Systemic Diam: 2.10 cm MV A velocity: 112.00 cm/s MV E/A ratio:  0.58 Keller Paterson Electronically signed by Keller Paterson Signature Date/Time: 03/03/2024/7:26:22 AM    Final    CT Angio Chest Pulmonary Embolism (PE) W or WO Contrast Result Date: 03/02/2024 EXAM: CTA of the Chest with contrast for PE 03/02/2024 08:13:29 PM TECHNIQUE: CTA of the chest was performed after the administration of 75 mL of iohexol  (OMNIPAQUE ) 350 MG/ML injection. Multiplanar reformatted images are provided for review. MIP images are provided for review. Automated exposure control, iterative reconstruction, and/or weight based adjustment of the mA/kV was utilized to reduce the radiation dose to as low as reasonably achievable. COMPARISON: CT chest dated 12/23/2023. CLINICAL HISTORY: Pulmonary embolism (PE) suspected, high prob. 78 y.o. male past medical history significant for anemia, COPD on chronic 2 L, CAD, CHF, dementia, who presents to the emergency department with hypoxia. History is provided by EMS. States that he went to go get his COVID and influenza vaccination today. When he got home started having  shortness of breath. Called EMS and when they arrived his oxygen was in the 50s. FINDINGS: PULMONARY ARTERIES: Pulmonary arteries are adequately opacified for evaluation. No pulmonary embolism. Main pulmonary artery is normal in caliber. MEDIASTINUM: The heart and pericardium demonstrate no acute abnormality. There is no acute abnormality of the thoracic aorta.  LYMPH NODES: Mediastinal lymphadenopathy, including a 19 mm short axis right paratracheal node (image 54), mildly progressive. No hilar or axillary lymphadenopathy. LUNGS AND PLEURA: Multifocal patchy opacities with air bronchograms in the dependent lungs bilaterally, predominantly in the right middle lobe and bilateral lower lobes, some of which may reflect lobar atelectasis (given the distribution). However, there is additional patchy opacities inferiorly in the right upper lobe and in the anterior left lower lobe suggesting multifocal infection / pneumonia. Subcentimeter irregular nodular opacity in the medial left lung apex (image 24) is new and is likely also infectious. Small bilateral pleural effusions. No pneumothorax. 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 Bilateral renal cysts, poorly evaluated, including a dominant 5.3 cm right renal sinus cyst (image 168), benign. No follow up is recommended. SOFT TISSUES AND BONES: No acute bone or soft tissue abnormality. IMPRESSION: 1. No evidence of pulmonary embolism. 2. Suspected multifocal pneumonia, possibly on the basis of aspiration. Underlying lobar atelectasis is also favored. 3. Small bilateral pleural effusions. 4. Mildly progressive mediastinal lymphadenopathy, possibly reactive. Consider attention on follow-up in 3 months. Electronically signed by: Pinkie Pebbles MD 03/02/2024 08:26 PM EDT RP Workstation: [id number]   DG Chest Port 1 View Result Date: 03/02/2024 CLINICAL DATA:  Question of sepsis to evaluate for abnormality. EXAM: PORTABLE  CHEST 1 VIEW COMPARISON:  CT chest 12/23/2023 FINDINGS: Shallow inspiration. Cardiac enlargement with pulmonary vascular congestion. Perihilar and basilar airspace disease likely representing edema but could also indicate pneumonia or aspiration. Small pleural effusions, greater on the right. No pneumothorax. Mediastinal contours appear intact. Calcification of the aorta. Patient rotation limits examination. IMPRESSION: Cardiac enlargement with pulmonary vascular congestion, perihilar edema, and bilateral pleural effusions. Electronically Signed   By: Elsie Gravely M.D.   On: 03/02/2024 19:10    Microbiology: Results for orders placed or performed during the hospital encounter of 03/02/24  Blood Culture (routine x 2)     Status: None (Preliminary result)   Collection Time: 03/02/24  6:46 PM   Specimen: BLOOD  Result Value Ref Range Status   Specimen Description BLOOD BLOOD RIGHT ARM  Final   Special Requests   Final    BOTTLES DRAWN AEROBIC AND ANAEROBIC Blood Culture results may not be optimal due to an inadequate volume of blood received in culture bottles   Culture   Final    NO GROWTH 2 DAYS Performed at [facility], [address], [place], [place] [address]    Report Status PENDING  Incomplete  Blood Culture (routine x 2)     Status: None (Preliminary result)   Collection Time: 03/02/24  6:46 PM   Specimen: BLOOD  Result Value Ref Range Status   Specimen Description BLOOD LEFT ANTECUBITAL  Final   Special Requests   Final    BOTTLES DRAWN AEROBIC AND ANAEROBIC Blood Culture results may not be optimal due to an inadequate volume of blood received in culture bottles   Culture   Final    NO GROWTH 2 DAYS Performed at [facility], [address], [place], [place] [address]    Report Status PENDING  Incomplete  Resp panel by RT-PCR (RSV, Flu A&B, Covid) Anterior Nasal Swab     Status: None   Collection Time: 03/02/24  6:49 PM   Specimen: Anterior Nasal Swab   Result Value Ref Range Status   SARS Coronavirus 2 by RT PCR NEGATIVE NEGATIVE Final    Comment: (NOTE) SARS-CoV-2 target nucleic acids are NOT  DETECTED.  The SARS-CoV-2 RNA is generally detectable in upper respiratory specimens during the acute phase of infection. The lowest concentration of SARS-CoV-2 viral copies this assay can detect is 138 copies/mL. A negative result does not preclude SARS-Cov-2 infection and should not be used as the sole basis for treatment or other patient management decisions. A negative result may occur with  improper specimen collection/handling, submission of specimen other than nasopharyngeal swab, presence of viral mutation(s) within the areas targeted by this assay, and inadequate number of viral copies(<138 copies/mL). A negative result must be combined with clinical observations, patient history, and epidemiological information. The expected result is Negative.  Fact Sheet for Patients:  BloggerCourse.com  Fact Sheet for Healthcare Providers:  SeriousBroker.it  This test is no t yet approved or cleared by the [place]  FDA and  has been authorized for detection and/or diagnosis of SARS-CoV-2 by FDA under an Emergency Use Authorization (EUA). This EUA will remain  in effect (meaning this test can be used) for the duration of the COVID-19 declaration under Section 564(b)(1) of the Act, 21 U.S.C.section 360bbb-3(b)(1), unless the authorization is terminated  or revoked sooner.       Influenza A by PCR NEGATIVE NEGATIVE Final   Influenza B by PCR NEGATIVE NEGATIVE Final    Comment: (NOTE) The Xpert Xpress SARS-CoV-2/FLU/RSV plus assay is intended as an aid in the diagnosis of influenza from Nasopharyngeal swab specimens and should not be used as a sole basis for treatment. Nasal washings and aspirates are unacceptable for Xpert Xpress SARS-CoV-2/FLU/RSV testing.  Fact Sheet for  Patients: BloggerCourse.com  Fact Sheet for Healthcare Providers: SeriousBroker.it  This test is not yet approved or cleared by the [place]  FDA and has been authorized for detection and/or diagnosis of SARS-CoV-2 by FDA under an Emergency Use Authorization (EUA). This EUA will remain in effect (meaning this test can be used) for the duration of the COVID-19 declaration under Section 564(b)(1) of the Act, 21 U.S.C. section 360bbb-3(b)(1), unless the authorization is terminated or revoked.     Resp Syncytial Virus by PCR NEGATIVE NEGATIVE Final    Comment: (NOTE) Fact Sheet for Patients: BloggerCourse.com  Fact Sheet for Healthcare Providers: SeriousBroker.it  This test is not yet approved or cleared by the [place]  FDA and has been authorized for detection and/or diagnosis of SARS-CoV-2 by FDA under an Emergency Use Authorization (EUA). This EUA will remain in effect (meaning this test can be used) for the duration of the COVID-19 declaration under Section 564(b)(1) of the Act, 21 U.S.C. section 360bbb-3(b)(1), unless the authorization is terminated or revoked.  Performed at [facility], [address] [place], [place] [address]   MRSA Next Gen by PCR, Nasal     Status: None   Collection Time: 03/03/24  1:07 AM   Specimen: Nasal Mucosa; Nasal Swab  Result Value Ref Range Status   MRSA by PCR Next Gen NOT DETECTED NOT DETECTED Final    Comment: (NOTE) The GeneXpert MRSA Assay (FDA approved for NASAL specimens only), is one component of a comprehensive MRSA colonization surveillance program. It is not intended to diagnose MRSA infection nor to guide or monitor treatment for MRSA infections. Test performance is not FDA approved in patients less than 10 years old. Performed at [facility], [address] [place], [place] [address]    Respiratory (~20 pathogens) panel by PCR     Status: None   Collection Time: 03/03/24  1:07 AM   Specimen: Nasopharyngeal Swab;  Respiratory  Result Value Ref Range Status   Adenovirus NOT DETECTED NOT DETECTED Final   Coronavirus 229E NOT DETECTED NOT DETECTED Final    Comment: (NOTE) The Coronavirus on the Respiratory Panel, DOES NOT test for the novel  Coronavirus (2019 nCoV)    Coronavirus HKU1 NOT DETECTED NOT DETECTED Final   Coronavirus NL63 NOT DETECTED NOT DETECTED Final   Coronavirus OC43 NOT DETECTED NOT DETECTED Final   Metapneumovirus NOT DETECTED NOT DETECTED Final   Rhinovirus / Enterovirus NOT DETECTED NOT DETECTED Final   Influenza A NOT DETECTED NOT DETECTED Final   Influenza B NOT DETECTED NOT DETECTED Final   Parainfluenza Virus 1 NOT DETECTED NOT DETECTED Final   Parainfluenza Virus 2 NOT DETECTED NOT DETECTED Final   Parainfluenza Virus 3 NOT DETECTED NOT DETECTED Final   Parainfluenza Virus 4 NOT DETECTED NOT DETECTED Final   Respiratory Syncytial Virus NOT DETECTED NOT DETECTED Final   Bordetella pertussis NOT DETECTED NOT DETECTED Final   Bordetella Parapertussis NOT DETECTED NOT DETECTED Final   Chlamydophila pneumoniae NOT DETECTED NOT DETECTED Final   Mycoplasma pneumoniae NOT DETECTED NOT DETECTED Final    Comment: Performed at [facility], [address] [place], [place] [address]    Labs: CBC: Recent Labs  Lab 03/02/24 1849 03/03/24 0552 03/04/24 0707  WBC 9.6 16.1* 8.4  NEUTROABS 7.3  --   --   HGB 11.0* 10.2* 9.0*  HCT 36.2* 32.6* 29.0*  MCV 97.8 96.4 95.1  PLT 115* 96* 99*   Basic Metabolic Panel: Recent Labs  Lab 03/02/24 1849 03/03/24 0552 03/04/24 0707  NA 138 139 139  K 5.1 5.7* 4.3  CL 102 102 102  CO2 27 27 28   GLUCOSE 187* 213* 126*  BUN 29* 28* 35*  CREATININE 1.35* 1.50* 1.41*  CALCIUM  8.4* 8.3* 8.4*   Liver Function Tests: Recent Labs  Lab 03/02/24 1849 03/03/24 0552  AST 33 31  28  ALT 20 21  21    ALKPHOS 83 56  58  BILITOT 0.7 0.6  0.5  PROT 7.1 6.4*  6.4*  ALBUMIN 3.7 3.1*  3.2*   CBG: Recent Labs  Lab 03/03/24 0054  GLUCAP 187*    Discharge time spent: greater than 30 minutes.  Signed: Charlie Patterson, MD [facility] 03/04/2024

## 2024-03-05 NOTE — TOC Transition Note (Signed)
 Transition of Care [facility]) - Discharge Note   Patient Details  Name: Brian Kramer MRN: [id number] Date of Birth: 11/27/1945  [facility] CM/SW Contact:  Marinda Cooks, RN Phone Number: 03/05/2024, 4:44 PM   Clinical Narrative:    This CM updated by covering MD pt medically cleared to dc today and has active DC order . This CM spoke with liaison @ [facility] arranged. This CM also spoke with Mitch with [employer] and coordinated pt's Bi-pap STA orders &referral for NIV, informed NOV is pending ins approval and Bi-PAP STA arranged for delivery this am with RT.  DC transportation confirmed for pt with family Medical team updated . No additional DC needs requested by medical team or identified by CM at this time .     Final next level of care: Home w Home Health Services Barriers to Discharge: No Barriers Identified   Choice offered to / list presented to : Patient   For [facility] & DME pt and wife had no preference for agency used    Name of family member notified: Breton Berns  Spouse  Emergency Contact  [phone] Patient and family notified of of transfer: 03/05/24  Discharge Plan and Services Additional resources added to the After Visit Summary for                  DME Arranged: NIV, Bipap DME Agency: [facility] Date DME Agency Contacted: 03/04/24 Time DME Agency Contacted: 1310 Representative spoke with at DME Agency: Mitch HH Arranged: PT, RN, OT HH Agency: [facility] Date HH Agency Contacted: 03/04/24 Time HH Agency Contacted: 1530 Representative spoke with at [facility] Agency: Shona  Social Drivers of Health (SDOH) Interventions SDOH Screenings   Food Insecurity: No Food Insecurity (03/03/2024)  Housing: Low Risk  (03/03/2024)  Transportation Needs: No Transportation Needs (03/03/2024)  Utilities: Not At Risk (03/03/2024)  Depression (PHQ2-9): Low Risk  (02/17/2024)  Financial Resource Strain: Low Risk  (08/04/2023)   Received from [facility]  Social Connections: Socially Isolated (03/03/2024)  Tobacco Use: Medium Risk (02/05/2024)     Readmission Risk Interventions     No data to display

## 2024-03-07 LAB — CULTURE, BLOOD (ROUTINE X 2)
Culture: NO GROWTH
Culture: NO GROWTH

## 2024-05-16 ENCOUNTER — Inpatient Hospital Stay: Attending: Oncology

## 2024-05-16 DIAGNOSIS — E785 Hyperlipidemia, unspecified: Secondary | ICD-10-CM | POA: Diagnosis not present

## 2024-05-16 DIAGNOSIS — K219 Gastro-esophageal reflux disease without esophagitis: Secondary | ICD-10-CM | POA: Insufficient documentation

## 2024-05-16 DIAGNOSIS — I272 Pulmonary hypertension, unspecified: Secondary | ICD-10-CM | POA: Diagnosis not present

## 2024-05-16 DIAGNOSIS — I2510 Atherosclerotic heart disease of native coronary artery without angina pectoris: Secondary | ICD-10-CM | POA: Diagnosis not present

## 2024-05-16 DIAGNOSIS — F0283 Dementia in other diseases classified elsewhere, unspecified severity, with mood disturbance: Secondary | ICD-10-CM | POA: Diagnosis not present

## 2024-05-16 DIAGNOSIS — I1310 Hypertensive heart and chronic kidney disease without heart failure, with stage 1 through stage 4 chronic kidney disease, or unspecified chronic kidney disease: Secondary | ICD-10-CM | POA: Insufficient documentation

## 2024-05-16 DIAGNOSIS — J90 Pleural effusion, not elsewhere classified: Secondary | ICD-10-CM | POA: Diagnosis not present

## 2024-05-16 DIAGNOSIS — E1122 Type 2 diabetes mellitus with diabetic chronic kidney disease: Secondary | ICD-10-CM | POA: Diagnosis not present

## 2024-05-16 DIAGNOSIS — J449 Chronic obstructive pulmonary disease, unspecified: Secondary | ICD-10-CM | POA: Insufficient documentation

## 2024-05-16 DIAGNOSIS — E1151 Type 2 diabetes mellitus with diabetic peripheral angiopathy without gangrene: Secondary | ICD-10-CM | POA: Diagnosis not present

## 2024-05-16 DIAGNOSIS — I252 Old myocardial infarction: Secondary | ICD-10-CM | POA: Insufficient documentation

## 2024-05-16 DIAGNOSIS — N1831 Chronic kidney disease, stage 3a: Secondary | ICD-10-CM | POA: Diagnosis not present

## 2024-05-16 DIAGNOSIS — R599 Enlarged lymph nodes, unspecified: Secondary | ICD-10-CM | POA: Insufficient documentation

## 2024-05-16 DIAGNOSIS — I4510 Unspecified right bundle-branch block: Secondary | ICD-10-CM | POA: Diagnosis not present

## 2024-05-16 DIAGNOSIS — Z79899 Other long term (current) drug therapy: Secondary | ICD-10-CM | POA: Insufficient documentation

## 2024-05-16 DIAGNOSIS — J9610 Chronic respiratory failure, unspecified whether with hypoxia or hypercapnia: Secondary | ICD-10-CM | POA: Diagnosis not present

## 2024-05-16 DIAGNOSIS — G2581 Restless legs syndrome: Secondary | ICD-10-CM | POA: Insufficient documentation

## 2024-05-16 DIAGNOSIS — Z7982 Long term (current) use of aspirin: Secondary | ICD-10-CM | POA: Insufficient documentation

## 2024-05-16 DIAGNOSIS — G3183 Dementia with Lewy bodies: Secondary | ICD-10-CM | POA: Insufficient documentation

## 2024-05-16 DIAGNOSIS — D509 Iron deficiency anemia, unspecified: Secondary | ICD-10-CM | POA: Insufficient documentation

## 2024-05-16 DIAGNOSIS — Z8711 Personal history of peptic ulcer disease: Secondary | ICD-10-CM | POA: Insufficient documentation

## 2024-05-16 DIAGNOSIS — G4733 Obstructive sleep apnea (adult) (pediatric): Secondary | ICD-10-CM | POA: Insufficient documentation

## 2024-05-16 DIAGNOSIS — Z87891 Personal history of nicotine dependence: Secondary | ICD-10-CM | POA: Insufficient documentation

## 2024-05-16 DIAGNOSIS — D696 Thrombocytopenia, unspecified: Secondary | ICD-10-CM | POA: Insufficient documentation

## 2024-05-16 DIAGNOSIS — D631 Anemia in chronic kidney disease: Secondary | ICD-10-CM

## 2024-05-16 DIAGNOSIS — I700 Atherosclerosis of aorta: Secondary | ICD-10-CM | POA: Insufficient documentation

## 2024-05-16 DIAGNOSIS — Z9981 Dependence on supplemental oxygen: Secondary | ICD-10-CM | POA: Diagnosis not present

## 2024-05-16 DIAGNOSIS — J811 Chronic pulmonary edema: Secondary | ICD-10-CM | POA: Insufficient documentation

## 2024-05-16 LAB — CBC WITH DIFFERENTIAL (CANCER CENTER ONLY)
Abs Immature Granulocytes: 0.02 K/uL (ref 0.00–0.07)
Basophils Absolute: 0 K/uL (ref 0.0–0.1)
Basophils Relative: 0 %
Eosinophils Absolute: 0.6 K/uL — ABNORMAL HIGH (ref 0.0–0.5)
Eosinophils Relative: 9 %
HCT: 30.4 % — ABNORMAL LOW (ref 39.0–52.0)
Hemoglobin: 9.4 g/dL — ABNORMAL LOW (ref 13.0–17.0)
Immature Granulocytes: 0 %
Lymphocytes Relative: 14 %
Lymphs Abs: 0.9 K/uL (ref 0.7–4.0)
MCH: 29.7 pg (ref 26.0–34.0)
MCHC: 30.9 g/dL (ref 30.0–36.0)
MCV: 95.9 fL (ref 80.0–100.0)
Monocytes Absolute: 0.4 K/uL (ref 0.1–1.0)
Monocytes Relative: 7 %
Neutro Abs: 4.3 K/uL (ref 1.7–7.7)
Neutrophils Relative %: 70 %
Platelet Count: 112 K/uL — ABNORMAL LOW (ref 150–400)
RBC: 3.17 MIL/uL — ABNORMAL LOW (ref 4.22–5.81)
RDW: 14.6 % (ref 11.5–15.5)
WBC Count: 6.1 K/uL (ref 4.0–10.5)
nRBC: 0 % (ref 0.0–0.2)

## 2024-05-16 LAB — RETIC PANEL
Immature Retic Fract: 12 % (ref 2.3–15.9)
RBC.: 3.2 MIL/uL — ABNORMAL LOW (ref 4.22–5.81)
Retic Count, Absolute: 76.2 K/uL (ref 19.0–186.0)
Retic Ct Pct: 2.4 % (ref 0.4–3.1)
Reticulocyte Hemoglobin: 32.3 pg (ref >27.9)

## 2024-05-16 LAB — FERRITIN: Ferritin: 45 ng/mL (ref 24–336)

## 2024-05-16 LAB — IRON AND TIBC
Iron: 39 ug/dL — ABNORMAL LOW (ref 45–182)
Saturation Ratios: 11 % — ABNORMAL LOW (ref 17.9–39.5)
TIBC: 339 ug/dL (ref 250–450)
UIBC: 300 ug/dL

## 2024-05-18 NOTE — Progress Notes (Signed)
 Chief Complaint  Patient presents with   Follow-up    HPI  Brian Kramer is a 78 y.o. male here for f/u of chronic medical issues  HLD: No acute issues.  Tolerating medications without adverse effects.  Denies any myalgia.  No cp or CVA symptoms.   CKD: No acute urinary complaints at this time.  Followed by nephrology.  Tolerating meds.  Obesity: Aware of weight issue.  No specific weight loss regimen.  ROS Review of systems is unremarkable for any active cardiac, respiratory, GI, GU, hematologic, neurologic, dermatologic, HEENT, or psychiatric symptoms except as noted above.  No fevers, chills, or constitutional symptoms.   Current Outpatient Medications  Medication Sig Dispense Refill   albuterol  MDI, PROVENTIL , VENTOLIN , PROAIR , HFA 90 mcg/actuation inhaler INHALE 2 INHALATIONS INTO THE LUNGS EVERY 6 HOURS AS NEEDED FOR WHEEZING 18 g 5   ambrisentan  (LETAIRIS ) 5 MG tablet TAKE 1 TABLET (5MG ) BY MOUTH ONCE DAILY 30 tablet 5   aspirin  81 MG EC tablet Take 81 mg by mouth every evening       azelastine  (ASTELIN ) 137 mcg nasal spray Place 2 sprays into both nostrils 2 (two) times daily as needed     calcium  carbonate 500 mg calcium  (1,250 mg) chewable tablet Take 1 tablet by mouth 2 (two) times daily With Vitamin D     DULoxetine  (CYMBALTA ) 30 MG DR capsule Take 3 capsules (90 mg total) by mouth once daily 270 capsule 3   ferrous sulfate  325 (65 FE) MG EC tablet Take 325 mg by mouth daily with breakfast 60 tablet 11   ipratropium (ATROVENT ) 0.03 % nasal spray Place 2 sprays into both nostrils 3 (three) times daily as needed for Rhinitis     multivitamin capsule Take 1 capsule by mouth once daily Bariatic multivitamin with iron - 1 tablet by mouth once daily      nitroGLYcerin (NITROSTAT) 0.4 MG SL tablet Place 1 tablet (0.4 mg total) under the tongue every 5 (five) minutes as needed for Chest pain May take up to 3 doses. 25 tablet 1   polyethylene glycol (MIRALAX ) packet Take  17 g by mouth once daily     pregabalin  (LYRICA ) 200 MG capsule Take 1 capsule (200 mg total) by mouth 3 (three) times daily for 180 days 90 capsule 5   QUEtiapine  (SEROQUEL ) 25 MG tablet Take 2 tablets (50 mg total) by mouth at bedtime 180 tablet 3   rOPINIRole  (REQUIP  XL) 4 MG XL tablet TAKE 1 TABLET(4 MG) BY MOUTH AT BEDTIME 90 tablet 3   rosuvastatin  (CRESTOR ) 5 MG tablet TAKE 1 TABLET(5 MG) BY MOUTH EVERY DAY 90 tablet 3   sodium zirconium cyclosilicate  (LOKELMA ) 5 gram packet MIX AND DRINK 1 PACKET(5 GRAMS) BY MOUTH EVERY OTHER DAY 50 packet 1   tamsulosin  (FLOMAX ) 0.4 mg capsule Take 0.4 mg by mouth once daily     TRELEGY ELLIPTA 100-62.5-25 mcg inhaler INHALE 1 PUFF INTO THE LUNGS DAILY 60 each 2   No current facility-administered medications for this visit.    Allergies as of 05/18/2024 - Reviewed 05/18/2024  Allergen Reaction Noted   Adhesive Other (See Comments) 11/12/2015   Penicillins Hives and Rash 08/29/2013   Shellfish containing products Hives and Rash 08/29/2013    Patient Active Problem List  Diagnosis   Cataract   Coronary artery disease involving native coronary artery of native heart without angina pectoris - followed by Dr. Florencio   Hyperlipidemia LDL goal <70 (LDL 30 - 05/11/24)  Osteoarthritis of right knee   Status post laparoscopic sleeve gastrectomy   Obesity, morbid (CMS-HCC)   Peripheral neuropathy, idiopathic on lyrica  - followed by Dr. Maree   RLS (restless legs syndrome)   Insomnia   History of normocytic normochromic anemia (Hgb 10.9 - 11/11/23) - Dr. Babara   Medicare annual wellness visit, initial 03/17/18   DNR (do not resuscitate)   Acquired claw toe of right foot   Medicare annual wellness visit, subsequent 08/01/23   Moderate major depression (CMS-HCC) - followed by [facility]   Cognitive impairment on donepezil - followed by Dr. Maree   Tremor on keppra - followed by Dr. Maree   Thrombocytopenia (Plt 125 - 11/11/23)    Mild pulmonary hypertension (CMS/HHS-HCC)   COPD, moderate (CMS-HCC) - followed by Dr. Theotis   Urge incontinence of urine   Stage 3a chronic kidney disease (Cr 1.4, GFR 51 - 05/11/24) - followed by Dr. 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 Maree 02/28/2021   COPD, moderate (CMS-HCC) - followed by Dr. Theotis 06/23/2022   Coronary atherosclerosis    Diabetes mellitus type 2, uncomplicated (CMS/HHS-HCC)    GERD (gastroesophageal reflux disease)    History of cataract    History of MI (myocardial infarction) 03/1995   History of myocardial infarction 03/1995   Hyperlipidemia    Insomnia    Migraine headache    Moderate major depression (CMS-HCC)    Neuropathy    Obesity    OSA (obstructive sleep apnea)    Osteoarthrosis involving lower leg    Overactive bladder    Presbycusis    PUD (peptic ulcer disease)    RLS (restless legs syndrome)    Thrombocytopenia () 02/28/2021   Tremor on keppra - followed by Dr. Maree 02/28/2021    Past Surgical History:  Procedure Laterality Date   HERNIA REPAIR Bilateral 2004   COLONOSCOPY  10/28/2012   Dr. KYM Canary @ [facility] - Tubular Adenoma   EGD  01/28/2016   DR. J. Colm    Laproscopic vertical sleeve  gastropathy  01/18/2017   Done by Marsa Angle   CARPOMETACARPAL [facility]) FUSION OF THUMB (Right) MC arthroplasty not fusion right thumb Right 04/08/2018   Dr. Kathlynn   COLONOSCOPY  05/19/2018   Diverticulosis/PHx CP/NO repeat due to age/TKT   CARPOMETACARPAL [facility]) FUSION OF LEFT THUMB, DIABETIC (Left) Left 12/02/2018   Dr. Kathlynn   Left elbow ulnar nerve release Left 04/14/2019   Dr. Kathlynn   ARTHROPLASTY TOTAL KNEE Left 04/06/2023   By Dr. Lorelle   Bilateral knee replacement surgeries     CATARACT EXTRACTION Bilateral    Right cataract- 11/23/13; left cataract- 12/21/13   CORONARY ANGIOPLASTY WITH STENT PLACEMENT     JOINT REPLACEMENT     Knees   KNEE ARTHROSCOPY  Right foot tendon repair     Right heel spur repair     Right shoulder surgery     Stent x 2- RCA     TONSILLECTOMY      Vitals:   05/18/24 0947  BP: 118/64  Pulse: 64  SpO2: 94%  Weight: (!) 119.7 kg (263 lb 12.8 oz)  Height: 172.7 cm (5' 8)  PainSc:   2  PainLoc: Head - Frontal   Body mass index is 40.11 kg/m.  Exam  General. Well appearing male in his baseline state on portable O2 arrived via wheelchair with family members; NAD; VS reviewed     Eyes. Sclera and conjunctiva clear; Vision grossly intact; extraocular movements intact Neck. Supple.  Lungs. Respirations unlabored; clear to auscultation bilaterally; no wheezing or rhonchi Cardiovascular. Heart regular rate and rhythm without murmurs, gallops, or rubs Abdomen. Soft; non tender; non distended; no masses or organomegaly Skin. Normal color and turgor Neurologic. Alert and oriented x3; no focal deficits  Assessment and Plan  1. Stage 3a chronic kidney disease (Cr 1.4, GFR 51 - 05/11/24) - followed by Dr. Marcelino Improving.  Continue with hydration and avoidance of nephrotoxic agents.  Followed by nephrology.  2. Obesity, morbid (CMS-HCC) Not at goal.  Counseled on nutrition modification (low sugar/carb mediterranean) and exercise  (strength/cardio).  Goal is to lose 1lb/wk.  Focus on low calorie diet (< net 1400/day).    3. Hyperlipidemia LDL goal <70 (LDL 30 - 05/11/24) Well controlled.  LFTs wnls (05/2024). Continue with current regimen.  Counseled on mediterranean diet and aerobic exercise. -     Comprehensive Metabolic Panel (CMP); Future -     Lipid Panel w/calc LDL; Future  Other orders -     Follow up in Primary Care -     Follow up in Primary Care; Future  F/u 6 months for PE and subsequent Medicare wellness.  Labs prior  ALDA CARPEN, MD

## 2024-05-19 ENCOUNTER — Ambulatory Visit: Admitting: Oncology

## 2024-05-19 ENCOUNTER — Inpatient Hospital Stay

## 2024-05-19 ENCOUNTER — Encounter: Payer: Self-pay | Admitting: Oncology

## 2024-05-19 VITALS — BP 119/52 | HR 68 | Temp 96.8°F | Resp 18 | Wt 263.0 lb

## 2024-05-19 VITALS — BP 112/78 | HR 65

## 2024-05-19 DIAGNOSIS — D696 Thrombocytopenia, unspecified: Secondary | ICD-10-CM | POA: Diagnosis not present

## 2024-05-19 DIAGNOSIS — N1831 Chronic kidney disease, stage 3a: Secondary | ICD-10-CM | POA: Diagnosis not present

## 2024-05-19 DIAGNOSIS — D509 Iron deficiency anemia, unspecified: Secondary | ICD-10-CM | POA: Diagnosis not present

## 2024-05-19 MED ORDER — IRON SUCROSE 20 MG/ML IV SOLN
200.0000 mg | Freq: Once | INTRAVENOUS | Status: AC
  Administered 2024-05-19: 200 mg via INTRAVENOUS
  Filled 2024-05-19: qty 10

## 2024-05-19 NOTE — Assessment & Plan Note (Addendum)
 Lab Results  Component Value Date   HGB 9.4 (L) 05/16/2024   TIBC 339 05/16/2024   IRONPCTSAT 11 (L) 05/16/2024   FERRITIN 45 05/16/2024    Recommend Venofer  200mg  weekly x 4.  Suspect that he has blood loss from GI tract. -he has appointment with GI.

## 2024-05-19 NOTE — Assessment & Plan Note (Signed)
 Previous work up showed normal LDH, negative flowcytometry, normal, normal B12, normal folate, negative HIV negative. hepatitis. Slightly elevated light chain ratio, non specific.  Platelet count has been stable.  Monitor.

## 2024-05-19 NOTE — Assessment & Plan Note (Signed)
 Encourage oral hydration and avoid nephrotoxins.

## 2024-05-19 NOTE — Progress Notes (Signed)
 Hematology/Oncology Progress note [phone] [phone]           REFERRING PROVIDER: Alla Amis, MD   CHIEF COMPLAINTS/REASON FOR VISIT:  Anemia  and thrombocytopenia   ASSESSMENT & PLAN:   Iron  deficiency anemia Lab Results  Component Value Date   HGB 9.4 (L) 05/16/2024   TIBC 339 05/16/2024   IRONPCTSAT 11 (L) 05/16/2024   FERRITIN 45 05/16/2024    Recommend Venofer  200mg  weekly x 4.  Suspect that he has blood loss from GI tract. -he has appointment with GI.     Thrombocytopenia Previous work up showed normal LDH, negative flowcytometry, normal, normal B12, normal folate, negative HIV negative. hepatitis. Slightly elevated light chain ratio, non specific.  Platelet count has been stable.  Monitor.   CKD stage 3a, GFR 45-59 ml/min (HCC) Encourage oral hydration and avoid nephrotoxins.    Orders Placed This Encounter  Procedures   CBC with Differential (Cancer Center Only)    Standing Status:   Future    Expected Date:   08/17/2024    Expiration Date:   11/15/2024   Iron  and TIBC    Standing Status:   Future    Expected Date:   08/17/2024    Expiration Date:   11/15/2024   Ferritin    Standing Status:   Future    Expected Date:   08/17/2024    Expiration Date:   11/15/2024   Follow up 3 months.   All questions were answered. The patient knows to call the clinic with any problems, questions or concerns.  Zelphia Cap, MD, PhD [facility] Hematology Oncology 05/19/2024   HISTORY OF PRESENTING ILLNESS:   Brian Kramer is a  78 y.o.  male with PMH listed below was seen in consultation at the request of  Alla Amis, MD  for evaluation of anemia.   He was hospitalized for pneumonia and acute on chronic respiratory failure,  He has chronic respiratory failure, has been on oxygen for a couple of years. A CT scan during his hospital stay showed some lymph node enlargement, which may be reactive to pneumonia.  He is experiencing  anemia with low hemoglobin and platelet counts, which have been dropping recently. No chest pain, shortness of breath, or palpitations.he received a dose of Venofer  on 08/01/2023 and tolerated well.  He has chronic kidney disease stage 3A, which is stable and not worsening.   He is currently taking an iron  supplement labeled as 65 mg, which is equivalent to 325 mg of the whole compound. He also receives iron  from a multivitamin.   He served in [employer] for twenty years, during which he drank and smoked. He currently drinks alcohol occasionally, about one mixed drink a week.   INTERVAL HISTORY Sherwin Ivo Moga is a 78 y.o. male who has above history reviewed by me today presents for follow up visit for anemia and thrombocytopenia.  Patient tolerated IV venofer . He takes oral iron  supplementation. He denies dark stool or rectal bleeding. No new complaints.   MEDICAL HISTORY:  Past Medical History:  Diagnosis Date   Anemia    Anginal pain    Anxiety    Arthritis    Bilateral renal cysts    CKD (chronic kidney disease), stage III (HCC)    COPD (chronic obstructive pulmonary disease) (HCC)    Coronary artery disease    a.) MI 03/1995 - no PCI; b.) LHC/PCI 1997 - stent (unknown type) x 1 to RCA; c.) LHC/PCI 2000 -  stent (unknown type) x 1 to LCx; d.) LHC 04/02/2010 - insig CAD with widely patent stents; medical mgmt; e.) LHC 04/30/2015 - patent stents; insig CAD; medical mgmt; f.) R/LHC 05/01/20: EF 60%, norm cors.   Depression    Diastolic dysfunction 09/05/2021   a.)  TTE 09/05/2021: EF 50%, mild BAE, trivial PR, mild MR/TR; pHTN (RVSP 50 mmHg), G1DD.   Essential hypertension 04/08/2021   GERD (gastroesophageal reflux disease)    a.) improved s/p sleeve gastrectomy; no daily Tx   History of hiatal hernia 2004   a.) s/p repair   History of obstructive sleep apnea    a.) resolved s/p sleeve gastrectomy; no longer requires nocturnal PAP therapy   HLD (hyperlipidemia)    Incomplete right  bundle branch block (RBBB)    Insomnia    Lewy body dementia (HCC)    Long term current use of aspirin     Loosening of knee joint prosthesis    Migraines    Myocardial infarction (HCC) 03/1995   a.) full details unknown; LHC performed, however no PCI performed.   On supplemental oxygen by nasal cannula    a.) 2L/[place] PRN   PAD (peripheral artery disease) 04/08/2021   Peripheral neuropathy    Pneumonia    Presbycusis    PUD (peptic ulcer disease)    Pulmonary hypertension (HCC)    a.) takes ambrisentan ; b.)  R/LHC 05/01/2020: EF 60%, mean PA 30, mean PCWP 16, RVEDP 19, Ao sat 95, PA sat 74, CO 8.2, PVR 137, SVR 736; c.)  TTE 09/05/2021: EF 50%, RVSP 50 mmHg.   RLS (restless legs syndrome)    a.) takes ropinirole    SOB (shortness of breath) 06/28/2022   Thrombocytopenia    Tremor    Type 2 diabetes, diet controlled (HCC)     SURGICAL HISTORY: Past Surgical History:  Procedure Laterality Date   AMPUTATION TOE Right 12/20/2021   Procedure: AMPUTATION TOE - SECOND AND THIRD - 28825;  Surgeon: Neill Boas, DPM;  Location: [facility];  Service: Podiatry;  Laterality: Right;   CARPOMETACARPAL ([facility]) FUSION OF THUMB Right 04/08/2018   Procedure: CARPOMETACARPAL [facility]) FUSION OF THUMB;  Surgeon: Kathlynn Sharper, MD;  Location: [facility];  Service: Orthopedics;  Laterality: Right;   CARPOMETACARPAL ([facility]) FUSION OF THUMB Left 12/02/2018   Procedure: CARPOMETACARPAL ([facility]) FUSION OF  LEFT THUMB, DIABETIC;  Surgeon: Kathlynn Sharper, MD;  Location: [facility];  Service: Orthopedics;  Laterality: Left;   CATARACT EXTRACTION Bilateral 2015   COLONOSCOPY     COLONOSCOPY WITH PROPOFOL  N/A 05/19/2018   Procedure: COLONOSCOPY WITH PROPOFOL ;  Surgeon: Toledo, Ladell POUR, MD;  Location: [facility];  Service: Gastroenterology;  Laterality: N/A;   CORONARY ANGIOPLASTY WITH STENT PLACEMENT Left 1997   Procedure: CORONARY ANGIOPLASTY WITH STENT PLACEMENT; stent x1 (unknown type) to RCA   CORONARY ANGIOPLASTY WITH  STENT PLACEMENT Left 2000   Procedure: CORONARY ANGIOPLASTY WITH STENT PLACEMENT; stent x1 (unknown type) to LCx   EXCISION PARTIAL PHALANX Left 10/04/2021   Procedure: EXCISION PARTIAL PHALANX - 2, 3, 4;  Surgeon: Neill Boas, DPM;  Location: [facility];  Service: Podiatry;  Laterality: Left;   EYE SURGERY     cataract bilateral with lens   FLEXOR TENOTOMY  Right 12/20/2021   Procedure: FLEXOR TENOTOMY - FOURTH TOE - 71989;  Surgeon: Neill Boas, DPM;  Location: [facility];  Service: Podiatry;  Laterality: Right;   HAMMER TOE SURGERY Right 01/15/2018   Procedure: HAMMER TOE CORRECTION-2ND & 3RD;  Surgeon: Lilli Cough,  DPM;  Location: [facility];  Service: Podiatry;  Laterality: Right;   HEEL SPUR RESECTION  2012   INGUINAL HERNIA REPAIR Bilateral    KNEE ARTHROSCOPY Right 2005   KNEE ARTHROSCOPY Left 2006   LAPAROSCOPIC GASTRIC SLEEVE RESECTION N/A 03/17/2016   LEFT HEART CATH AND CORONARY ANGIOGRAPHY Left 04/02/2010   Procedure: LEFT HEART CATH AND CORONARY ANGIOGRAPHY; Location: [facility]; Surgeon: Lynwood Sous, MD   LEFT HEART CATH AND CORONARY ANGIOGRAPHY Left 04/30/2015   Procedure: LEFT HEART CATH AND CORONARY ANGIOGRAPHY; Location: [facility]; Surgeon: FABIENE Jama Constable, MD   RIGHT AND LEFT HEART CATH Left 05/01/2020   Procedure: RIGHT AND LEFT HEART CATH;  Surgeon: Florencio Cara BIRCH, MD;  Location: [facility];  Service: Cardiovascular;  Laterality: Left;   Right shoulder AC separation surgery in 1995 Right    Stem Cells to left heel  2017   TONSILLECTOMY     TOTAL KNEE ARTHROPLASTY Left 2007   TOTAL KNEE ARTHROPLASTY Right 2015   TOTAL KNEE REVISION Left 04/06/2023   Procedure: Left total knee revision, both components;  Surgeon: Lorelle Hussar, MD;  Location: [facility];  Service: Orthopedics;  Laterality: Left;   ULNAR TUNNEL RELEASE Left 04/14/2019   Procedure: LEFT CUBITAL TUNNEL RELEASE;  Surgeon: Kathlynn Sharper, MD;  Location: [facility];  Service: Orthopedics;  Laterality: Left;    UPPER GI ENDOSCOPY  2017   VASECTOMY     1970s   WEIL OSTEOTOMY Right 01/15/2018   Procedure: WEIL OSTEOTOMY-2ND & 3RD;  Surgeon: Lilli Cough, DPM;  Location: [facility];  Service: Podiatry;  Laterality: Right;    SOCIAL HISTORY: Social History   Socioeconomic History   Marital status: Married    Spouse name: phyllis   Number of children: Not on file   Years of education: Not on file   Highest education level: Not on file  Occupational History   Occupation: paediatric nurse    Comment: retired  Tobacco Use   Smoking status: Former    Current packs/day: 0.00    Types: Cigarettes    Quit date: 03/09/1994    Years since quitting: 30.2   Smokeless tobacco: Never  Vaping Use   Vaping status: Never Used  Substance and Sexual Activity   Alcohol use: Yes    Alcohol/week: 1.0 - 2.0 standard drink of alcohol    Types: 1 - 2 Standard drinks or equivalent per week    Comment: 1-2 per week   Drug use: Never   Sexual activity: Not Currently    Birth control/protection: None  Other Topics Concern   Not on file  Social History Narrative   Not on file   Social Drivers of Health   Tobacco Use: Medium Risk (05/19/2024)   Patient History    Smoking Tobacco Use: Former    Smokeless Tobacco Use: Never    Passive Exposure: Not on file  Financial Resource Strain: Low Risk  (08/04/2023)   Received from [facility]   Overall Financial Resource Strain (CARDIA)    Difficulty of Paying Living Expenses: Not hard at all  Food Insecurity: No Food Insecurity (03/03/2024)   Epic    Worried About Radiation Protection Practitioner of Food in the Last Year: Never true    Ran Out of Food in the Last Year: Never true  Transportation Needs: No Transportation Needs (03/03/2024)   Epic    Lack of Transportation (Medical): No    Lack of Transportation (Non-Medical): No  Physical Activity: Not on file  Stress: Not on file  Social Connections: Socially Isolated (03/03/2024)   Social Connection  and Isolation Panel    Frequency of Communication with Friends and Family: Never    Frequency of Social Gatherings with Friends and Family: Twice a week    Attends Religious Services: Never    Database Administrator or Organizations: No    Attends Banker Meetings: Never    Marital Status: Married  Catering Manager Violence: Not At Risk (03/03/2024)   Epic    Fear of Current or Ex-Partner: No    Emotionally Abused: No    Physically Abused: No    Sexually Abused: No  Depression (PHQ2-9): Low Risk (02/17/2024)   Depression (PHQ2-9)    PHQ-2 Score: 0  Alcohol Screen: Not on file  Housing: Low Risk (03/03/2024)   Epic    Unable to Pay for Housing in the Last Year: No    Number of Times Moved in the Last Year: 0    Homeless in the Last Year: No  Utilities: Not At Risk (03/03/2024)   Epic    Threatened with loss of utilities: No  Health Literacy: Not on file    FAMILY HISTORY: Family History  Problem Relation Age of Onset   Hypertension Mother    Hypertension Father    Stroke Father    Hypertension Maternal Grandmother     ALLERGIES:  is allergic to tape, penicillins, and shellfish allergy.  MEDICATIONS:  Current Outpatient Medications  Medication Sig Dispense Refill   albuterol  (VENTOLIN  HFA) 108 (90 Base) MCG/ACT inhaler Inhale 2 puffs into the lungs every 6 (six) hours as needed for wheezing or shortness of breath.     ambrisentan  (LETAIRIS ) 5 MG tablet Take 5 mg by mouth daily.     aspirin  EC 81 MG tablet Take 81 mg by mouth at bedtime.      azelastine  (ASTELIN ) 0.1 % nasal spray Place 1 spray into both nostrils 2 (two) times daily as needed for rhinitis or allergies. Use in each nostril as directed     Calcium  500-100 MG-UNIT CHEW Chew 500 mg by mouth 2 (two) times daily. Calcium  is bariatric chewable with vitamin D included     DULoxetine  (CYMBALTA ) 30 MG capsule Take 90 mg by mouth daily.     ferrous sulfate  325 (65 FE) MG tablet Take 325 mg by mouth daily  with breakfast.     finasteride  (PROSCAR ) 5 MG tablet Take 1 tablet (5 mg total) by mouth daily. 30 tablet 0   Fluticasone -Umeclidin-Vilant (TRELEGY ELLIPTA) 100-62.5-25 MCG/ACT AEPB Inhale 1 puff into the lungs daily.     ipratropium (ATROVENT ) 0.03 % nasal spray Place 2 sprays into both nostrils 3 (three) times daily as needed for rhinitis.      LOKELMA  5 g packet Take by mouth.     LYRICA  200 MG capsule Take 200 mg by mouth 3 (three) times daily.      mometasone (ELOCON) 0.1 % lotion 1-4 drops See admin instructions. 1-4 drops in each ear as needed for itching     Multiple Vitamins-Minerals (BARIATRIC MULTIVITAMINS/IRON  PO) Take 1 tablet by mouth daily.     nitroGLYCERIN (NITROSTAT) 0.4 MG SL tablet Place 0.4 mg under the tongue every 5 (five) minutes as needed for chest pain.      OXYGEN Inhale 2 L into the lungs at bedtime.     polyethylene glycol (MIRALAX  / GLYCOLAX ) 17 g packet Take by mouth.     QUEtiapine  (SEROQUEL ) 25  MG tablet Take 25 mg by mouth at bedtime.     rOPINIRole  (REQUIP  XL) 4 MG 24 hr tablet Take 4 mg by mouth at bedtime.     rosuvastatin  (CRESTOR ) 5 MG tablet Take 5 mg by mouth every evening.     tamsulosin  (FLOMAX ) 0.4 MG CAPS capsule Take 1 capsule (0.4 mg total) by mouth daily. 30 capsule 11   No current facility-administered medications for this visit.    Review of Systems  Constitutional:  Positive for fever. Negative for appetite change, chills and unexpected weight change.  HENT:   Negative for hearing loss and voice change.   Eyes:  Negative for eye problems and icterus.  Respiratory:  Positive for shortness of breath. Negative for chest tightness and cough.   Cardiovascular:  Negative for chest pain and leg swelling.  Gastrointestinal:  Negative for abdominal distention, abdominal pain, blood in stool, nausea and vomiting.  Endocrine: Negative for hot flashes.  Genitourinary:  Negative for difficulty urinating, dysuria and frequency.   Musculoskeletal:   Negative for arthralgias.  Skin:  Negative for itching and rash.  Neurological:  Negative for light-headedness and numbness.  Hematological:  Negative for adenopathy. Does not bruise/bleed easily.  Psychiatric/Behavioral:  Negative for confusion.    PHYSICAL EXAMINATION: ECOG PERFORMANCE STATUS: 1 - Symptomatic but completely ambulatory Vitals:   05/19/24 1005  BP: (!) 119/52  Pulse: 68  Resp: 18  Temp: (!) 96.8 F (36 C)  SpO2: 95%   Filed Weights   05/19/24 1005  Weight: 263 lb (119.3 kg)    Physical Exam Constitutional:      General: He is not in acute distress.    Appearance: He is obese.  HENT:     Head: Normocephalic and atraumatic.  Eyes:     General: No scleral icterus. Cardiovascular:     Rate and Rhythm: Normal rate and regular rhythm.  Pulmonary:     Effort: Pulmonary effort is normal. No respiratory distress.     Comments: Decreased breath sounds bilaterally  Abdominal:     General: Bowel sounds are normal. There is no distension.     Palpations: Abdomen is soft.  Musculoskeletal:        General: No deformity. Normal range of motion.     Cervical back: Normal range of motion and neck supple.  Skin:    General: Skin is warm and dry.     Findings: No erythema or rash.  Neurological:     Mental Status: He is alert and oriented to person, place, and time. Mental status is at baseline.  Psychiatric:        Mood and Affect: Mood normal.     LABORATORY DATA:  I have reviewed the data as listed    Latest Ref Rng & Units 05/16/2024    9:44 AM 03/04/2024    7:07 AM 03/03/2024    5:52 AM  CBC  WBC 4.0 - 10.5 K/uL 6.1  8.4  16.1   Hemoglobin 13.0 - 17.0 g/dL 9.4  9.0  89.7   Hematocrit 39.0 - 52.0 % 30.4  29.0  32.6   Platelets 150 - 400 K/uL 112  99  96       Latest Ref Rng & Units 03/04/2024    7:07 AM 03/03/2024    5:52 AM 03/02/2024    6:49 PM  CMP  Glucose 70 - 99 mg/dL 873  786  812   BUN 8 - 23 mg/dL 35  28  29  Creatinine 0.61 - 1.24 mg/dL  8.58  8.49  8.64   Sodium 135 - 145 mmol/L 139  139  138   Potassium 3.5 - 5.1 mmol/L 4.3  5.7  5.1   Chloride 98 - 111 mmol/L 102  102  102   CO2 22 - 32 mmol/L 28  27  27    Calcium  8.9 - 10.3 mg/dL 8.4  8.3  8.4   Total Protein 6.5 - 8.1 g/dL 6.5 - 8.1 g/dL  6.4    6.4  7.1   Total Bilirubin 0.0 - 1.2 mg/dL 0.0 - 1.2 mg/dL  0.5    0.6  0.7   Alkaline Phos 38 - 126 U/L 38 - 126 U/L  58    56  83   AST 15 - 41 U/L 15 - 41 U/L  28    31  33   ALT 0 - 44 U/L 0 - 44 U/L  21    21  20        RADIOGRAPHIC STUDIES: I have personally reviewed the radiological images as listed and agreed with the findings in the report. ECHOCARDIOGRAM COMPLETE Result Date: 03/03/2024    ECHOCARDIOGRAM REPORT   Patient Name:   JACQUELYN ANTONY Hollopeter Date of Exam: 03/02/2024 Medical Rec #:  [id number]           Height:       66.0 in Accession #:    [id number]          Weight:       262.9 lb Date of Birth:  Nov 06, 1945           BSA:          2.246 m Patient Age:    78 years            BP:           153/76 mmHg Patient Gender: M                   HR:           91 bpm. Exam Location:  [facility] Procedure: 2D Echo, Cardiac Doppler and Color Doppler (Both Spectral and Color            Flow Doppler were utilized during procedure). Indications:     I27.2 Pulmonary hypertension  History:         Patient has prior history of Echocardiogram examinations, most                  recent 01/26/2020. CAD and Previous Myocardial Infarction, COPD;                  Risk Factors:Hypertension, Diabetes and Dyslipidemia. Pulmonary                  hypertension. Obstructive sleep apnea- CPAP. Chronic kidney                  disease.  Sonographer:     Carl Coma RDCS Referring Phys:  [id number] MARIEN LITTIE PIETY Diagnosing Phys: Keller Alluri IMPRESSIONS  1. Left ventricular ejection fraction, by estimation, is 55 to 60%. The left ventricle has normal function. The left ventricle has no regional wall motion abnormalities. There is mild  left ventricular hypertrophy. Left ventricular diastolic parameters are consistent with Grade I diastolic dysfunction (impaired relaxation).  2. Right ventricular systolic function is normal. The right ventricular size is mildly enlarged. Tricuspid regurgitation signal is inadequate for assessing PA pressure.  3.  The mitral valve is normal in structure. No evidence of mitral valve regurgitation.  4. The aortic valve is tricuspid. Aortic valve regurgitation is not visualized. FINDINGS  Left Ventricle: Left ventricular ejection fraction, by estimation, is 55 to 60%. The left ventricle has normal function. The left ventricle has no regional wall motion abnormalities. The left ventricular internal cavity size was normal in size. There is  mild left ventricular hypertrophy. Left ventricular diastolic parameters are consistent with Grade I diastolic dysfunction (impaired relaxation). Right Ventricle: The right ventricular size is mildly enlarged. No increase in right ventricular wall thickness. Right ventricular systolic function is normal. Tricuspid regurgitation signal is inadequate for assessing PA pressure. Left Atrium: Left atrial size was normal in size. Right Atrium: Right atrial size was normal in size. Pericardium: There is no evidence of pericardial effusion. Mitral Valve: The mitral valve is normal in structure. No evidence of mitral valve regurgitation. Tricuspid Valve: The tricuspid valve is normal in structure. Tricuspid valve regurgitation is trivial. Aortic Valve: The aortic valve is tricuspid. Aortic valve regurgitation is not visualized. Pulmonic Valve: The pulmonic valve was not well visualized. Pulmonic valve regurgitation is not visualized. Aorta: The aortic root and ascending aorta are structurally normal, with no evidence of dilitation. IAS/Shunts: The atrial septum is grossly normal.  LEFT VENTRICLE PLAX 2D LVIDd:         4.70 cm   Diastology LVIDs:         2.90 cm   LV e' medial:    8.77 cm/s LV  PW:         1.20 cm   LV E/e' medial:  7.4 LV IVS:        0.80 cm   LV e' lateral:   14.70 cm/s LVOT diam:     2.10 cm   LV E/e' lateral: 4.4 LV SV:         92 LV SV Index:   41 LVOT Area:     3.46 cm  RIGHT VENTRICLE             IVC RV Basal diam:  4.20 cm     IVC diam: 2.20 cm RV S prime:     18.95 cm/s TAPSE (M-mode): 2.7 cm LEFT ATRIUM             Index        RIGHT ATRIUM           Index LA diam:        4.80 cm 2.14 cm/m   RA Area:     18.60 cm LA Vol (A2C):   66.2 ml 29.47 ml/m  RA Volume:   54.60 ml  24.31 ml/m LA Vol (A4C):   75.5 ml 33.61 ml/m LA Biplane Vol: 73.3 ml 32.63 ml/m  AORTIC VALVE LVOT Vmax:   144.00 cm/s LVOT Vmean:  108.000 cm/s LVOT VTI:    0.265 m  AORTA Ao Root diam: 3.50 cm Ao Asc diam:  3.60 cm MITRAL VALVE MV Area (PHT): 3.60 cm     SHUNTS MV Decel Time: 211 msec     Systemic VTI:  0.26 m MV E velocity: 65.00 cm/s   Systemic Diam: 2.10 cm MV A velocity: 112.00 cm/s MV E/A ratio:  0.58 Keller Paterson Electronically signed by Keller Paterson Signature Date/Time: 03/03/2024/7:26:22 AM    Final    CT Angio Chest Pulmonary Embolism (PE) W or WO Contrast Result Date: 03/02/2024 EXAM: CTA of the Chest with contrast for PE 03/02/2024 08:13:29 PM  TECHNIQUE: CTA of the chest was performed after the administration of 75 mL of iohexol  (OMNIPAQUE ) 350 MG/ML injection. Multiplanar reformatted images are provided for review. MIP images are provided for review. Automated exposure control, iterative reconstruction, and/or weight based adjustment of the mA/kV was utilized to reduce the radiation dose to as low as reasonably achievable. COMPARISON: CT chest dated 12/23/2023. CLINICAL HISTORY: Pulmonary embolism (PE) suspected, high prob. 78 y.o. male past medical history significant for anemia, COPD on chronic 2 L, CAD, CHF, dementia, who presents to the emergency department with hypoxia. History is provided by EMS. States that he went to go get his COVID and influenza vaccination today. When he  got home started having shortness of breath. Called EMS and when they arrived his oxygen was in the 50s. FINDINGS: PULMONARY ARTERIES: Pulmonary arteries are adequately opacified for evaluation. No pulmonary embolism. Main pulmonary artery is normal in caliber. MEDIASTINUM: The heart and pericardium demonstrate no acute abnormality. There is no acute abnormality of the thoracic aorta. LYMPH NODES: Mediastinal lymphadenopathy, including a 19 mm short axis right paratracheal node (image 54), mildly progressive. No hilar or axillary lymphadenopathy. LUNGS AND PLEURA: Multifocal patchy opacities with air bronchograms in the dependent lungs bilaterally, predominantly in the right middle lobe and bilateral lower lobes, some of which may reflect lobar atelectasis (given the distribution). However, there is additional patchy opacities inferiorly in the right upper lobe and in the anterior left lower lobe suggesting multifocal infection / pneumonia. Subcentimeter irregular nodular opacity in the medial left lung apex (image 24) is new and is likely also infectious. Small bilateral pleural effusions. No pneumothorax. 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 Bilateral renal cysts, poorly evaluated, including a dominant 5.3 cm right renal sinus cyst (image 168), benign. No follow up is recommended. SOFT TISSUES AND BONES: No acute bone or soft tissue abnormality. IMPRESSION: 1. No evidence of pulmonary embolism. 2. Suspected multifocal pneumonia, possibly on the basis of aspiration. Underlying lobar atelectasis is also favored. 3. Small bilateral pleural effusions. 4. Mildly progressive mediastinal lymphadenopathy, possibly reactive. Consider attention on follow-up in 3 months. Electronically signed by: Pinkie Pebbles MD 03/02/2024 08:26 PM EDT RP Workstation: [id number]   DG Chest Port 1 View Result Date: 03/02/2024 CLINICAL DATA:  Question of sepsis to evaluate for  abnormality. EXAM: PORTABLE CHEST 1 VIEW COMPARISON:  CT chest 12/23/2023 FINDINGS: Shallow inspiration. Cardiac enlargement with pulmonary vascular congestion. Perihilar and basilar airspace disease likely representing edema but could also indicate pneumonia or aspiration. Small pleural effusions, greater on the right. No pneumothorax. Mediastinal contours appear intact. Calcification of the aorta. Patient rotation limits examination. IMPRESSION: Cardiac enlargement with pulmonary vascular congestion, perihilar edema, and bilateral pleural effusions. Electronically Signed   By: Elsie Gravely M.D.   On: 03/02/2024 19:10

## 2024-05-23 ENCOUNTER — Other Ambulatory Visit: Payer: Self-pay | Admitting: Specialist

## 2024-05-23 DIAGNOSIS — R590 Localized enlarged lymph nodes: Secondary | ICD-10-CM

## 2024-05-23 DIAGNOSIS — I272 Pulmonary hypertension, unspecified: Secondary | ICD-10-CM

## 2024-05-26 ENCOUNTER — Inpatient Hospital Stay

## 2024-05-26 VITALS — BP 113/47 | HR 55 | Temp 96.9°F

## 2024-05-26 DIAGNOSIS — D509 Iron deficiency anemia, unspecified: Secondary | ICD-10-CM | POA: Diagnosis not present

## 2024-05-26 MED ORDER — SODIUM CHLORIDE 0.9% FLUSH
10.0000 mL | Freq: Once | INTRAVENOUS | Status: AC | PRN
  Administered 2024-05-26: 14:00:00 10 mL
  Filled 2024-05-26: qty 10

## 2024-05-26 MED ADMIN — Iron Sucrose Inj 20 MG/ML (Fe Equiv): 200 mg | INTRAVENOUS | @ 14:00:00 | NDC 00517231001

## 2024-05-26 MED FILL — Iron Sucrose Inj 20 MG/ML (Fe Equiv): 200.0000 mg | INTRAVENOUS | Qty: 10 | Status: AC

## 2024-05-31 ENCOUNTER — Inpatient Hospital Stay

## 2024-05-31 VITALS — BP 109/50 | HR 63 | Temp 96.0°F | Resp 18

## 2024-05-31 DIAGNOSIS — D509 Iron deficiency anemia, unspecified: Secondary | ICD-10-CM

## 2024-05-31 MED ORDER — IRON SUCROSE 20 MG/ML IV SOLN
200.0000 mg | Freq: Once | INTRAVENOUS | Status: AC
  Administered 2024-05-31: 200 mg via INTRAVENOUS
  Filled 2024-05-31: qty 10

## 2024-05-31 MED ORDER — SODIUM CHLORIDE 0.9% FLUSH
10.0000 mL | Freq: Once | INTRAVENOUS | Status: AC | PRN
  Administered 2024-05-31: 10 mL
  Filled 2024-05-31: qty 10

## 2024-06-07 ENCOUNTER — Inpatient Hospital Stay

## 2024-06-07 VITALS — BP 114/54 | HR 76 | Temp 96.0°F | Resp 18

## 2024-06-07 DIAGNOSIS — D509 Iron deficiency anemia, unspecified: Secondary | ICD-10-CM | POA: Diagnosis not present

## 2024-06-07 MED ORDER — IRON SUCROSE 20 MG/ML IV SOLN
200.0000 mg | Freq: Once | INTRAVENOUS | Status: AC
  Administered 2024-06-07: 200 mg via INTRAVENOUS

## 2024-06-07 NOTE — Patient Instructions (Signed)

## 2024-06-20 ENCOUNTER — Ambulatory Visit
Admission: RE | Admit: 2024-06-20 | Discharge: 2024-06-20 | Disposition: A | Discharge status: Home / Self Care | Source: Ambulatory Visit | Attending: Specialist | Admitting: Specialist

## 2024-06-20 DIAGNOSIS — I272 Pulmonary hypertension, unspecified: Secondary | ICD-10-CM | POA: Insufficient documentation

## 2024-06-20 DIAGNOSIS — R590 Localized enlarged lymph nodes: Secondary | ICD-10-CM | POA: Diagnosis present

## 2024-08-10 ENCOUNTER — Inpatient Hospital Stay

## 2024-08-17 ENCOUNTER — Inpatient Hospital Stay

## 2024-08-17 ENCOUNTER — Inpatient Hospital Stay: Admitting: Oncology

## 2024-09-08 ENCOUNTER — Ambulatory Visit: Admitting: Physician Assistant
